# Patient Record
Sex: Female | Born: 1950
Health system: Southern US, Community
[De-identification: ages and names within clinical notes are randomized; demographics above are authoritative.]

## PROBLEM LIST (undated history)

## (undated) DIAGNOSIS — M1990 Unspecified osteoarthritis, unspecified site: Secondary | ICD-10-CM

## (undated) DIAGNOSIS — Z923 Personal history of irradiation: Secondary | ICD-10-CM

## (undated) DIAGNOSIS — C801 Malignant (primary) neoplasm, unspecified: Secondary | ICD-10-CM

## (undated) DIAGNOSIS — E785 Hyperlipidemia, unspecified: Secondary | ICD-10-CM

## (undated) DIAGNOSIS — Z972 Presence of dental prosthetic device (complete) (partial): Secondary | ICD-10-CM

## (undated) DIAGNOSIS — Z860101 Personal history of adenomatous and serrated colon polyps: Secondary | ICD-10-CM

## (undated) DIAGNOSIS — I10 Essential (primary) hypertension: Secondary | ICD-10-CM

## (undated) DIAGNOSIS — E079 Disorder of thyroid, unspecified: Secondary | ICD-10-CM

## (undated) DIAGNOSIS — E119 Type 2 diabetes mellitus without complications: Secondary | ICD-10-CM

## (undated) DIAGNOSIS — Z86010 Personal history of colonic polyps: Secondary | ICD-10-CM

## (undated) DIAGNOSIS — K6282 Dysplasia of anus: Secondary | ICD-10-CM

## (undated) DIAGNOSIS — S72009A Fracture of unspecified part of neck of unspecified femur, initial encounter for closed fracture: Secondary | ICD-10-CM

## (undated) HISTORY — DX: Disorder of thyroid, unspecified: E07.9

## (undated) HISTORY — DX: Malignant (primary) neoplasm, unspecified: C80.1

## (undated) HISTORY — DX: Hyperlipidemia, unspecified: E78.5

## (undated) HISTORY — DX: Essential (primary) hypertension: I10

## (undated) HISTORY — DX: Unspecified osteoarthritis, unspecified site: M19.90

## (undated) HISTORY — PX: POLYPECTOMY: SHX149

## (undated) HISTORY — PX: TUBAL LIGATION: SHX77

## (undated) HISTORY — PX: FRACTURE SURGERY: SHX138

---

## 1997-12-26 ENCOUNTER — Emergency Department (HOSPITAL_COMMUNITY): Admission: EM | Admit: 1997-12-26 | Discharge: 1997-12-26 | Payer: Self-pay | Admitting: Emergency Medicine

## 1999-03-09 ENCOUNTER — Other Ambulatory Visit: Admission: RE | Admit: 1999-03-09 | Discharge: 1999-03-09 | Payer: Self-pay | Admitting: Family Medicine

## 1999-05-05 ENCOUNTER — Other Ambulatory Visit: Admission: RE | Admit: 1999-05-05 | Discharge: 1999-05-22 | Payer: Self-pay | Admitting: Family Medicine

## 2000-05-17 ENCOUNTER — Other Ambulatory Visit: Admission: RE | Admit: 2000-05-17 | Discharge: 2000-05-17 | Payer: Self-pay | Admitting: Family Medicine

## 2002-07-07 ENCOUNTER — Other Ambulatory Visit: Admission: RE | Admit: 2002-07-07 | Discharge: 2002-07-07 | Payer: Self-pay | Admitting: Family Medicine

## 2002-07-13 ENCOUNTER — Ambulatory Visit (HOSPITAL_COMMUNITY): Admission: RE | Admit: 2002-07-13 | Discharge: 2002-07-13 | Payer: Self-pay

## 2002-07-13 ENCOUNTER — Encounter: Payer: Self-pay | Admitting: Family Medicine

## 2003-11-30 ENCOUNTER — Other Ambulatory Visit: Admission: RE | Admit: 2003-11-30 | Discharge: 2003-11-30 | Payer: Self-pay | Admitting: *Deleted

## 2003-12-13 ENCOUNTER — Ambulatory Visit (HOSPITAL_COMMUNITY): Admission: RE | Admit: 2003-12-13 | Discharge: 2003-12-13 | Payer: Self-pay | Admitting: Family Medicine

## 2006-03-18 ENCOUNTER — Ambulatory Visit (HOSPITAL_COMMUNITY): Admission: RE | Admit: 2006-03-18 | Discharge: 2006-03-18 | Payer: Self-pay | Admitting: Family Medicine

## 2006-04-02 ENCOUNTER — Encounter: Admission: RE | Admit: 2006-04-02 | Discharge: 2006-04-02 | Payer: Self-pay | Admitting: Family Medicine

## 2006-08-20 DIAGNOSIS — C801 Malignant (primary) neoplasm, unspecified: Secondary | ICD-10-CM

## 2006-08-20 HISTORY — DX: Malignant (primary) neoplasm, unspecified: C80.1

## 2007-05-27 ENCOUNTER — Ambulatory Visit (HOSPITAL_COMMUNITY): Admission: RE | Admit: 2007-05-27 | Discharge: 2007-05-27 | Payer: Self-pay | Admitting: Family Medicine

## 2007-06-09 ENCOUNTER — Encounter: Admission: RE | Admit: 2007-06-09 | Discharge: 2007-06-09 | Payer: Self-pay | Admitting: Family Medicine

## 2007-07-18 ENCOUNTER — Encounter: Admission: RE | Admit: 2007-07-18 | Discharge: 2007-07-18 | Payer: Self-pay | Admitting: Family Medicine

## 2007-08-21 HISTORY — PX: THYROIDECTOMY: SHX17

## 2008-01-22 ENCOUNTER — Encounter: Admission: RE | Admit: 2008-01-22 | Discharge: 2008-01-22 | Payer: Self-pay | Admitting: Family Medicine

## 2008-03-16 ENCOUNTER — Encounter: Admission: RE | Admit: 2008-03-16 | Discharge: 2008-05-06 | Payer: Self-pay | Admitting: Internal Medicine

## 2008-06-20 DIAGNOSIS — Z85850 Personal history of malignant neoplasm of thyroid: Secondary | ICD-10-CM

## 2008-06-20 DIAGNOSIS — E890 Postprocedural hypothyroidism: Secondary | ICD-10-CM

## 2008-06-20 HISTORY — DX: Postprocedural hypothyroidism: E89.0

## 2008-06-20 HISTORY — DX: Personal history of malignant neoplasm of thyroid: Z85.850

## 2008-06-30 ENCOUNTER — Encounter (INDEPENDENT_AMBULATORY_CARE_PROVIDER_SITE_OTHER): Payer: Self-pay | Admitting: General Surgery

## 2008-06-30 ENCOUNTER — Ambulatory Visit (HOSPITAL_COMMUNITY): Admission: RE | Admit: 2008-06-30 | Discharge: 2008-07-01 | Payer: Self-pay | Admitting: General Surgery

## 2008-06-30 HISTORY — PX: TOTAL THYROIDECTOMY: SHX2547

## 2008-09-27 ENCOUNTER — Encounter (HOSPITAL_COMMUNITY): Admission: RE | Admit: 2008-09-27 | Discharge: 2008-12-26 | Payer: Self-pay | Admitting: General Surgery

## 2008-09-28 ENCOUNTER — Encounter: Admission: RE | Admit: 2008-09-28 | Discharge: 2008-09-28 | Payer: Self-pay | Admitting: Family Medicine

## 2009-10-17 ENCOUNTER — Encounter (HOSPITAL_COMMUNITY): Admission: RE | Admit: 2009-10-17 | Discharge: 2009-12-20 | Payer: Self-pay | Admitting: General Surgery

## 2009-10-26 ENCOUNTER — Encounter: Admission: RE | Admit: 2009-10-26 | Discharge: 2009-10-26 | Payer: Self-pay | Admitting: Family Medicine

## 2009-11-01 ENCOUNTER — Encounter: Admission: RE | Admit: 2009-11-01 | Discharge: 2009-11-01 | Payer: Self-pay | Admitting: Family Medicine

## 2010-04-18 ENCOUNTER — Emergency Department (HOSPITAL_COMMUNITY): Admission: EM | Admit: 2010-04-18 | Discharge: 2010-04-19 | Payer: Self-pay | Admitting: Emergency Medicine

## 2010-09-10 ENCOUNTER — Encounter: Payer: Self-pay | Admitting: Family Medicine

## 2010-09-10 ENCOUNTER — Encounter: Payer: Self-pay | Admitting: General Surgery

## 2010-12-29 ENCOUNTER — Encounter (INDEPENDENT_AMBULATORY_CARE_PROVIDER_SITE_OTHER): Payer: Self-pay | Admitting: General Surgery

## 2011-01-02 NOTE — Op Note (Signed)
NAMEDESTINI, CAMBRE              ACCOUNT NO.:  [id number]   MEDICAL RECORD NO.:  [id number]          PATIENT TYPE:  OIB   LOCATION:  1308                         FACILITY:  [facility]   PHYSICIAN:  Sharlet Salina T. Hoxworth, M.D.DATE OF BIRTH:  01/15/1951   DATE OF PROCEDURE:  06/30/2008  DATE OF DISCHARGE:                               OPERATIVE REPORT   POSTOPERATIVE DIAGNOSIS:  Multinodular goiter.   POSTOPERATIVE DIAGNOSIS:  Multinodular goiter.   SURGICAL PROCEDURE:  Total thyroidectomy.   SURGEON:  Lorne Skeens. Hoxworth, M.D.   ASSISTANTAngelia Mould. Derrell Lolling, M.D.   ANESTHESIA:  General.   BRIEF HISTORY:  Ms. Dosanjh is a 60 year old female with a history of a  progressively enlarging multinodular goiter over a number of years.  She  now is having intermittent increased dysphagia, pressure symptoms and  ultrasound has shown enlargement with multiple nodules measuring up to  2.8 cm bilaterally and the right lobe of the thyroid measuring up to 9.5  cm extending beneath the right clavicle.  This confirmed on exam.  Due  to enlargement of symptoms I have recommended proceeding with total  thyroidectomy.  The nature of the procedure, indications, risks of  bleeding, infection, permanent hypocalcemia and recurrent laryngeal  nerve injury with hoarseness have been discussed and understood.  She is  now brought to the operating room for this procedure.   DESCRIPTION OF PROCEDURE:  The patient was brought to the operating  room, placed in supine position on the operating table and general  endotracheal anesthesia was induced.  She was carefully positioned with  her neck extended and the neck was prepped from chin to nipples and  sterilely draped.  Correct procedure and the patient were verified.  A  curvilinear incision was made in the skin crease two fingerbreadths  above the sternal notch and dissection was carried down through the  subcu and platysma with cautery.  Subplatysmal flaps  were then raised  superiorly up to the thyroid notch, inferiorly to the sternum and  laterally out to the sternocleidomastoid muscles.  The strap muscles  were then divided along the midline from the thyroid cartilage down to  the sternal notch.  The right side was approached first being the larger  side.  The anterior gland was exposed carefully bluntly dissecting the  strap muscles off of this using cautery.  The gland was mobilized out  laterally, inferiorly and superiorly along its anterior surfaces.  The  lower pole was mobilized from up underneath the clavicle with careful  blunt dissection.  The middle thyroid vein was divided with the Harmonic  scalpel.  Following this, traction was applied to the upper pole and  careful blunt dissection upper pole vessels were isolated, clearly  identified and tied in continuity and then additionally clipped  proximally and then divided with the Harmonic scalpel.  This then  allowed the entire gland to rotate nicely anteriorly and using careful  blunt dissection the inferior thyroid vessels were exposed and  dissected.  The recurrent laryngeal nerve was nicely identified along  its course and was protected.  Staying close to  the gland the individual  branches of the inferior thyroid artery were divided with the Harmonic  scalpel and the large lobe was able to mobilized up to the anterior  trachea where it was mobilized off the trachea with cautery to the  isthmus and the isthmus was divided with the Harmonic scalpel.  We did  feel that we saw one of the superior parathyroid gland.  This lobe was  oriented with a suture.  There was no significant bleeding.  Following  this the left side was approached with a very similar essentially  identical dissection.  Again the recurrent laryngeal nerve was nicely  identified along its course.  We did feel that we saw and preserved one  parathyroid gland on this side as well.  After complete removal of the   gland the wound was irrigated and Surgicel was placed along the  trachea/tracheoesophageal groove on either side.  The wound inspected  for a couple of minutes for hemostasis which appeared complete.  Following this the strap muscles were closed in midline with interrupted  Vicryl.  The platysma was closed with interrupted Vicryl and skin closed  with staples and Steri-Strips and Benzoin.  Sponge and instrument counts  were correct.  The patient was taken to recovery in good condition.      Lorne Skeens. Hoxworth, M.D.  Electronically Signed     BTH/MEDQ  D:  06/30/2008  T:  06/30/2008  Job:  [id number]

## 2011-05-22 LAB — CBC
HCT: 39
MCV: 82.3
Platelets: 191
RBC: 4.74
RDW: 13.5
RDW: 13.6
WBC: 5.5

## 2011-05-22 LAB — GLUCOSE, CAPILLARY
Glucose-Capillary: 146 — ABNORMAL HIGH
Glucose-Capillary: 187 — ABNORMAL HIGH
Glucose-Capillary: 241 — ABNORMAL HIGH
Glucose-Capillary: 242 — ABNORMAL HIGH

## 2011-05-22 LAB — URINALYSIS, ROUTINE W REFLEX MICROSCOPIC
Glucose, UA: NEGATIVE
Nitrite: NEGATIVE
Protein, ur: NEGATIVE
Urobilinogen, UA: 2 — ABNORMAL HIGH
pH: 5.5

## 2011-05-22 LAB — DIFFERENTIAL
Basophils Absolute: 0.1
Basophils Relative: 1
Monocytes Absolute: 0.3
Monocytes Relative: 6
Neutrophils Relative %: 60

## 2011-05-22 LAB — COMPREHENSIVE METABOLIC PANEL
ALT: 41 — ABNORMAL HIGH
AST: 36
Albumin: 4
BUN: 12
Calcium: 8.8
GFR calc Af Amer: >60
GFR calc non Af Amer: >60
Glucose, Bld: 154 — ABNORMAL HIGH

## 2011-05-22 LAB — CALCIUM
Calcium: 7.6 — ABNORMAL LOW
Calcium: 7.8 — ABNORMAL LOW

## 2011-06-05 ENCOUNTER — Encounter (INDEPENDENT_AMBULATORY_CARE_PROVIDER_SITE_OTHER): Payer: Self-pay | Admitting: General Surgery

## 2011-08-03 ENCOUNTER — Other Ambulatory Visit: Payer: Self-pay | Admitting: Family Medicine

## 2011-08-03 ENCOUNTER — Other Ambulatory Visit: Payer: Self-pay | Admitting: Nurse Practitioner

## 2011-08-03 DIAGNOSIS — Z1231 Encounter for screening mammogram for malignant neoplasm of breast: Secondary | ICD-10-CM

## 2011-08-24 ENCOUNTER — Ambulatory Visit (INDEPENDENT_AMBULATORY_CARE_PROVIDER_SITE_OTHER): Payer: Commercial Managed Care - PPO | Admitting: General Surgery

## 2011-08-24 ENCOUNTER — Encounter (INDEPENDENT_AMBULATORY_CARE_PROVIDER_SITE_OTHER): Payer: Self-pay | Admitting: General Surgery

## 2011-08-24 VITALS — BP 144/76 | HR 92 | Temp 97.2°F | Ht 59.0 in | Wt 165.8 lb

## 2011-08-24 DIAGNOSIS — Z85850 Personal history of malignant neoplasm of thyroid: Secondary | ICD-10-CM

## 2011-08-24 HISTORY — DX: Personal history of malignant neoplasm of thyroid: Z85.850

## 2011-08-24 NOTE — Progress Notes (Signed)
History: Patient returns for long-term followup with a history of cancer of the thyroid, 2 cm papillary cancer with total thyroidectomy in November of 2009. TNM was T1 B. N0. She received postoperative I-131 treatment. She had a negative scan in March of 2011. Her Synthroid dose is being adjusted by her primary care team. She reports no neck symptoms and is feeling well.  Exam: Gen.: Moderately obese in no acute distress HEENT no palpable neck masses. Incision well healed. Voice is normal. Lymph nodes: No palpable cervical or supraclavicular lymph nodes.  Assessment and plan: Over 3 years following total thyroidectomy and I-131 treatment for T1 B. papillary cancer of the thyroid. Doing well with no evidence of recurrent disease. Her Synthroid dose is being managed by her primary care team. I will be available as needed.

## 2011-08-31 ENCOUNTER — Ambulatory Visit
Admission: RE | Admit: 2011-08-31 | Discharge: 2011-08-31 | Disposition: A | Discharge status: Home / Self Care | Payer: Commercial Managed Care - PPO | Source: Ambulatory Visit | Attending: Family Medicine | Admitting: Family Medicine

## 2011-08-31 DIAGNOSIS — Z1231 Encounter for screening mammogram for malignant neoplasm of breast: Secondary | ICD-10-CM

## 2011-10-12 ENCOUNTER — Encounter: Payer: Self-pay | Admitting: Internal Medicine

## 2011-11-15 ENCOUNTER — Ambulatory Visit (AMBULATORY_SURGERY_CENTER): Payer: 59 | Admitting: *Deleted

## 2011-11-15 VITALS — Ht 59.0 in | Wt 163.8 lb

## 2011-11-15 DIAGNOSIS — Z1211 Encounter for screening for malignant neoplasm of colon: Secondary | ICD-10-CM

## 2011-11-15 MED ORDER — PEG-KCL-NACL-NASULF-NA ASC-C 100 G PO SOLR: ORAL | Status: DC

## 2011-11-29 ENCOUNTER — Encounter: Payer: Commercial Managed Care - PPO | Admitting: Internal Medicine

## 2011-12-06 ENCOUNTER — Ambulatory Visit (AMBULATORY_SURGERY_CENTER): Payer: 59 | Admitting: Internal Medicine

## 2011-12-06 ENCOUNTER — Encounter: Payer: Self-pay | Admitting: Internal Medicine

## 2011-12-06 VITALS — BP 139/76 | HR 71 | Temp 98.6°F | Resp 16 | Ht 59.0 in | Wt 163.0 lb

## 2011-12-06 DIAGNOSIS — K635 Polyp of colon: Secondary | ICD-10-CM

## 2011-12-06 DIAGNOSIS — D126 Benign neoplasm of colon, unspecified: Secondary | ICD-10-CM

## 2011-12-06 DIAGNOSIS — Z1211 Encounter for screening for malignant neoplasm of colon: Secondary | ICD-10-CM

## 2011-12-06 HISTORY — PX: COLONOSCOPY: SHX174

## 2011-12-06 MED ORDER — SODIUM CHLORIDE 0.9 % IV SOLN: 500.0000 mL | INTRAVENOUS | Status: DC

## 2011-12-06 NOTE — Op Note (Signed)
[facility] [address] [place], [place]  [address]  COLONOSCOPY PROCEDURE REPORT  Schmidt:  Morgan Schmidt, Morgan Schmidt  MR#:  [id number] BIRTHDATE:  23-May-1951, 60 yrs. old  GENDER:  female ENDOSCOPIST:  Carie Caddy. Dorota Heinrichs, MD REF. BY:  Paulita Cradle, N.P. PROCEDURE DATE:  12/06/2011 PROCEDURE:  Colonoscopy with polypectomy and submucosal injection ASA CLASS:  Class II INDICATIONS:  Routine Risk Screening, 1st colonoscopy MEDICATIONS:   MAC sedation, administered by CRNA, propofol (Diprivan) 380 mg IV  DESCRIPTION OF PROCEDURE:   After Morgan risks benefits and alternatives of Morgan procedure were thoroughly explained, informed consent was obtained.  Digital rectal exam was performed and revealed external hemorrhoids.   Morgan LB CF-H180AL [id number] endoscope was introduced through Morgan anus and advanced to Morgan terminal ileum which was intubated for a short distance, without limitations.  Morgan quality of Morgan prep was good, using MoviPrep. Morgan instrument was then slowly withdrawn as Morgan colon was fully examined.<<PROCEDUREIMAGES>>  FINDINGS:  Morgan terminal ileum appeared normal.  Mild diverticulosis was found ascending colon to sigmoid colon.  A 6 mm sessile polyp was found in Morgan ascending colon. Polyp was snared, then cauterized with monopolar cautery. Retrieval was successful. An 8 mm sessile polyp was found in Morgan ascending colon. Polyp was snared, then cauterized with monopolar cautery. Removal was in piecemeal fashion. Retrieval was successful.  [place] Ink was used [employer] for tattoo beside polypectomy site.   A 5 mm  sessile polyp was found in Morgan transverse colon. Polyp was snared without cautery. Retrieval was successful. A 3 mm sessile polyp was found in Morgan sigmoid colon. Polyp was snared without cautery. Retrieval was successful. snare polyp  A 2 cm pedunculated polyp was found in Morgan sigmoid colon. Submucosal injection with 1:10,000 epinephrine was used at Morgan time of colonoscope  insertion to shrink polyp.  Polyp was snared during withdrawal, then cauterized with monopolar cautery. Retrieval was successful.  A 5 mm sessile polyp was found in Morgan rectum. Polyp was snared without cautery. Retrieval was successful.  External Hemorrhoids were found. Retroflexed views in Morgan rectum revealed no abnormalities.  Morgan scope was then withdrawn  from Morgan cecum and Morgan procedure completed.  COMPLICATIONS:  None  ENDOSCOPIC IMPRESSION: 1) Normal terminal ileum 2) Mild diverticulosis ascending colon to sigmoid colon 3) Sessile polyp in Morgan ascending colon. Removed and sent to pathology. 4) Sessile polyp in Morgan ascending colon, removed piecemeal fashion.  Tattoo placed.  Sent to pathology. 5) Sessile polyp in Morgan transverse colon. Removed and sent to pathology. 6) Sessile polyp in Morgan sigmoid colon. Removed and sent to pathology. 7) Pedunculated polyp in Morgan sigmoid colon. Removed and sent to pathology. 8) Sessile polyp in Morgan rectum. Removed and sent to pathology. 9) External hemorrhoids  RECOMMENDATIONS: 1) Hold aspirin, aspirin products, and anti-inflammatory medication for 2 weeks. 2) Await pathology results 3) High fiber diet. 4) You will receive a letter within 1-2 weeks with Morgan results of your biopsy as well as final recommendations. Please call my office if you have not received a letter after 3 weeks. 5) Repeat colonoscopy in 6 months to ensure complete polypectomy at piecemeal polypectomy site.  Carie Caddy. Rhea Belton, MD  CC:  Paulita Cradle, NP Morgan Schmidt  n. eSIGNEDCarie Caddy. Tal Neer at 12/06/2011 02:25 PM  Morgan Schmidt, [id number]

## 2011-12-06 NOTE — Patient Instructions (Signed)
Discharge instructions given with verbal understanding. Handouts on polyps,diverticulosis and hemorrhoids given. Resume previous medications. Hold aspirin, aspirin products, and anti-inflammatory products for two weeks.YOU HAD AN ENDOSCOPIC PROCEDURE TODAY AT THE [facility]: Refer to the procedure report that was given to you for any specific questions about what was found during the examination.  If the procedure report does not answer your questions, please call your gastroenterologist to clarify.  If you requested that your care partner not be given the details of your procedure findings, then the procedure report has been included in a sealed envelope for you to review at your convenience later.  YOU SHOULD EXPECT: Some feelings of bloating in the abdomen. Passage of more gas than usual.  Walking can help get rid of the air that was put into your GI tract during the procedure and reduce the bloating. If you had a lower endoscopy (such as a colonoscopy or flexible sigmoidoscopy) you may notice spotting of blood in your stool or on the toilet paper. If you underwent a bowel prep for your procedure, then you may not have a normal bowel movement for a few days.  DIET: Your first meal following the procedure should be a light meal and then it is ok to progress to your normal diet.  A half-sandwich or bowl of soup is an example of a good first meal.  Heavy or fried foods are harder to digest and may make you feel nauseous or bloated.  Likewise meals heavy in dairy and vegetables can cause extra gas to form and this can also increase the bloating.  Drink plenty of fluids but you should avoid alcoholic beverages for 24 hours.  ACTIVITY: Your care partner should take you home directly after the procedure.  You should plan to take it easy, moving slowly for the rest of the day.  You can resume normal activity the day after the procedure however you should NOT DRIVE or use heavy machinery for 24  hours (because of the sedation medicines used during the test).    SYMPTOMS TO REPORT IMMEDIATELY: A gastroenterologist can be reached at any hour.  During normal business hours, 8:30 AM to 5:00 PM Monday through Friday, call [phone].  After hours and on weekends, please call the GI answering service at [phone] who will take a message and have the physician on call contact you.   Following lower endoscopy (colonoscopy or flexible sigmoidoscopy):  Excessive amounts of blood in the stool  Significant tenderness or worsening of abdominal pains  Swelling of the abdomen that is new, acute  Fever of 100F or higher  FOLLOW UP: If any biopsies were taken you will be contacted by phone or by letter within the next 1-3 weeks.  Call your gastroenterologist if you have not heard about the biopsies in 3 weeks.  Our staff will call the home number listed on your records the next business day following your procedure to check on you and address any questions or concerns that you may have at that time regarding the information given to you following your procedure. This is a courtesy call and so if there is no answer at the home number and we have not heard from you through the emergency physician on call, we will assume that you have returned to your regular daily activities without incident.  SIGNATURES/CONFIDENTIALITY: You and/or your care partner have signed paperwork which will be entered into your electronic medical record.  These signatures attest to the fact  that that the information above on your After Visit Summary has been reviewed and is understood.  Full responsibility of the confidentiality of this discharge information lies with you and/or your care-partner.

## 2011-12-06 NOTE — Progress Notes (Signed)
Patient did not experience any of the following events: a burn prior to discharge; a fall within the facility; wrong site/side/patient/procedure/implant event; or a hospital transfer or hospital admission upon discharge from the facility. (G8907) Patient did not have preoperative order for IV antibiotic SSI prophylaxis. (G8918)  

## 2011-12-07 ENCOUNTER — Telehealth: Payer: Self-pay

## 2011-12-07 NOTE — Telephone Encounter (Signed)
  Follow up Call-  Call back number 12/06/2011  Post procedure Call Back phone  # [phone]  Permission to leave phone message Yes     Patient questions:  Do you have a fever, pain , or abdominal swelling? no Pain Score  0 *  Have you tolerated food without any problems? yes  Have you been able to return to your normal activities? yes  Do you have any questions about your discharge instructions: Diet   no Medications  no Follow up visit  no  Do you have questions or concerns about your Care? no  Actions: * If pain score is 4 or above: No action needed, pain <4.

## 2011-12-14 ENCOUNTER — Encounter: Payer: Self-pay | Admitting: Internal Medicine

## 2012-05-27 ENCOUNTER — Encounter: Payer: Self-pay | Admitting: Internal Medicine

## 2012-11-13 ENCOUNTER — Telehealth: Payer: Self-pay | Admitting: Physician Assistant

## 2012-11-13 MED ORDER — METFORMIN HCL 1000 MG PO TABS: 1000.0000 mg | ORAL_TABLET | Freq: Two times a day (BID) | ORAL | Status: DC

## 2012-11-13 MED ORDER — LEVOTHYROXINE SODIUM 112 MCG PO TABS: 112.0000 ug | ORAL_TABLET | Freq: Every day | ORAL | Status: DC

## 2012-11-13 NOTE — Telephone Encounter (Signed)
Needs refill on thyroid med and diabetic medicine, metformin.  [facility].

## 2012-11-13 NOTE — Telephone Encounter (Signed)
Both meds refilled 

## 2012-11-18 ENCOUNTER — Other Ambulatory Visit: Payer: Self-pay

## 2012-11-18 DIAGNOSIS — Z1231 Encounter for screening mammogram for malignant neoplasm of breast: Secondary | ICD-10-CM

## 2012-12-18 ENCOUNTER — Encounter: Payer: Self-pay | Admitting: Internal Medicine

## 2012-12-18 ENCOUNTER — Ambulatory Visit
Admission: RE | Admit: 2012-12-18 | Discharge: 2012-12-18 | Disposition: A | Discharge status: Home / Self Care | Payer: 59 | Source: Ambulatory Visit

## 2012-12-18 DIAGNOSIS — Z1231 Encounter for screening mammogram for malignant neoplasm of breast: Secondary | ICD-10-CM

## 2012-12-26 ENCOUNTER — Telehealth: Payer: Self-pay | Admitting: General Practice

## 2012-12-26 NOTE — Telephone Encounter (Signed)
Called patient to review Mammogram results. No answer

## 2013-03-09 ENCOUNTER — Encounter: Payer: Self-pay | Admitting: Family Medicine

## 2013-03-09 ENCOUNTER — Ambulatory Visit (INDEPENDENT_AMBULATORY_CARE_PROVIDER_SITE_OTHER): Payer: 59 | Admitting: Family Medicine

## 2013-03-09 VITALS — BP 130/76 | HR 86 | Temp 99.6°F | Ht 59.0 in | Wt 164.4 lb

## 2013-03-09 DIAGNOSIS — L0291 Cutaneous abscess, unspecified: Secondary | ICD-10-CM

## 2013-03-09 DIAGNOSIS — L309 Dermatitis, unspecified: Secondary | ICD-10-CM

## 2013-03-09 DIAGNOSIS — L0390 Cellulitis, unspecified: Secondary | ICD-10-CM

## 2013-03-09 DIAGNOSIS — L259 Unspecified contact dermatitis, unspecified cause: Secondary | ICD-10-CM

## 2013-03-09 MED ORDER — METHYLPREDNISOLONE (PAK) 4 MG PO TABS: ORAL_TABLET | ORAL | Status: DC

## 2013-03-09 MED ORDER — DOXYCYCLINE HYCLATE 100 MG PO TABS: 100.0000 mg | ORAL_TABLET | Freq: Two times a day (BID) | ORAL | Status: DC

## 2013-03-09 MED ORDER — HYDROXYZINE HCL 25 MG PO TABS: 25.0000 mg | ORAL_TABLET | Freq: Three times a day (TID) | ORAL | Status: DC | PRN

## 2013-03-09 NOTE — Patient Instructions (Signed)
Cellulitis Cellulitis is an infection of the skin and the tissue beneath it. The infected area is usually red and tender. Cellulitis occurs most often in the arms and lower legs.  CAUSES  Cellulitis is caused by bacteria that enter the skin through cracks or cuts in the skin. The most common types of bacteria that cause cellulitis are Staphylococcus and Streptococcus. SYMPTOMS   Redness and warmth.  Swelling.  Tenderness or pain.  Fever. DIAGNOSIS  Your caregiver can usually determine what is wrong based on a physical exam. Blood tests may also be done. TREATMENT  Treatment usually involves taking an antibiotic medicine. HOME CARE INSTRUCTIONS   Take your antibiotics as directed. Finish them even if you start to feel better.  Keep the infected arm or leg elevated to reduce swelling.  Apply a warm cloth to the affected area up to 4 times per day to relieve pain.  Only take over-the-counter or prescription medicines for pain, discomfort, or fever as directed by your caregiver.  Keep all follow-up appointments as directed by your caregiver. SEEK MEDICAL CARE IF:   You notice red streaks coming from the infected area.  Your red area gets larger or turns dark in color.  Your bone or joint underneath the infected area becomes painful after the skin has healed.  Your infection returns in the same area or another area.  You notice a swollen bump in the infected area.  You develop new symptoms. SEEK IMMEDIATE MEDICAL CARE IF:   You have a fever.  You feel very sleepy.  You develop vomiting or diarrhea.  You have a general ill feeling (malaise) with muscle aches and pains. MAKE SURE YOU:   Understand these instructions.  Will watch your condition.  Will get help right away if you are not doing well or get worse. Document Released: 05/16/2005 Document Revised: 02/05/2012 Document Reviewed: 10/22/2011 ExitCare Patient Information 2014 [employer].  

## 2013-03-09 NOTE — Progress Notes (Signed)
  Subjective:    Patient ID: ILY DENNO, female    DOB: 25-May-1951, 63 y.o.   MRN: [id number]  HPI This 62 y.o. female presents for evaluation of rash for over a week.  She states she  Was bitten by an insect and then she had a mild rash.  She got worse over The weekend .   Review of Systems No chest pain, SOB, HA, dizziness, vision change, N/V, diarrhea, constipation, dysuria, urinary urgency or frequency, myalgias, arthralgias or rash.     Objective:   Physical Exam  Vital signs noted  Well developed well nourished female.  HEENT - Head atraumatic Normocephalic                Eyes - PERRLA, Conjuctiva - clear Sclera- Clear EOMI                Ears - EAC's Wnl TM's Wnl Gross Hearing WNL                Nose - Nares patent                 Throat - oropharanx wnl Respiratory - Lungs CTA bilateral Cardiac - RRR S1 and S2 without murmur GI - Abdomen soft Nontender and bowel sounds active x 4 Extremities - Right first toe with swelling erythema and small ulcer in skin, rash over bilateral lower extremities. Neuro - Grossly intact.      Assessment & Plan:  Dermatitis - Plan: hydrOXYzine (ATARAX/VISTARIL) 25 MG tablet, methylPREDNIsolone (MEDROL DOSPACK) 4 MG tablet  Cellulitis - Plan: doxycycline (VIBRA-TABS) 100 MG tablet

## 2013-05-05 ENCOUNTER — Encounter: Payer: Self-pay | Admitting: Family Medicine

## 2013-05-05 ENCOUNTER — Ambulatory Visit (INDEPENDENT_AMBULATORY_CARE_PROVIDER_SITE_OTHER): Payer: 59 | Admitting: Family Medicine

## 2013-05-05 VITALS — BP 129/70 | HR 82 | Temp 97.4°F | Ht 59.0 in | Wt 163.4 lb

## 2013-05-05 DIAGNOSIS — Z0000 Encounter for general adult medical examination without abnormal findings: Secondary | ICD-10-CM

## 2013-05-05 DIAGNOSIS — Z780 Asymptomatic menopausal state: Secondary | ICD-10-CM

## 2013-05-05 DIAGNOSIS — N951 Menopausal and female climacteric states: Secondary | ICD-10-CM

## 2013-05-05 DIAGNOSIS — E039 Hypothyroidism, unspecified: Secondary | ICD-10-CM

## 2013-05-05 DIAGNOSIS — E119 Type 2 diabetes mellitus without complications: Secondary | ICD-10-CM

## 2013-05-05 LAB — POCT CBC
Granulocyte percent: 60.7 %G (ref 37–80)
HCT, POC: 39.2 % (ref 37.7–47.9)
Hemoglobin: 13.3 g/dL (ref 12.2–16.2)
Lymph, poc: 1.9 (ref 0.6–3.4)
MCH, POC: 27.5 pg (ref 27–31.2)
MCHC: 34 g/dL (ref 31.8–35.4)
MCV: 81 fL (ref 80–97)
MPV: 7.8 fL (ref 0–99.8)
POC Granulocyte: 3.3 (ref 2–6.9)
POC LYMPH PERCENT: 33.9 %L (ref 10–50)
Platelet Count, POC: 205 10*3/uL (ref 142–424)
RBC: 4.8 M/uL (ref 4.04–5.48)
RDW, POC: 13.8 %
WBC: 5.5 10*3/uL (ref 4.6–10.2)

## 2013-05-05 LAB — POCT GLYCOSYLATED HEMOGLOBIN (HGB A1C): Hemoglobin A1C: 8.4

## 2013-05-05 MED ORDER — LEVOTHYROXINE SODIUM 112 MCG PO TABS: 112.0000 ug | ORAL_TABLET | Freq: Every day | ORAL | Status: DC

## 2013-05-05 MED ORDER — INSULIN DETEMIR 100 UNIT/ML ~~LOC~~ SOLN: 40.0000 [IU] | Freq: Every day | SUBCUTANEOUS | Status: DC

## 2013-05-05 MED ORDER — SITAGLIPTIN PHOSPHATE 100 MG PO TABS: 100.0000 mg | ORAL_TABLET | Freq: Every day | ORAL | Status: DC

## 2013-05-05 MED ORDER — METFORMIN HCL 1000 MG PO TABS: 1000.0000 mg | ORAL_TABLET | Freq: Two times a day (BID) | ORAL | Status: DC

## 2013-05-05 NOTE — Patient Instructions (Addendum)

## 2013-05-05 NOTE — Progress Notes (Signed)
  Subjective:    [name], female    DOB: [birth year], 62 y.o.   MRN: [id number]  HPI This 62 y.o. female presents for evaluation of follow up.  She has hx diabetes and hypothyroidism. She works 12 hour shifts at night at [employer].  She works as Lawyer.  She states She has fasting fsbs of 120's.   Review of Systems No chest pain, SOB, HA, dizziness, vision change, N/V, diarrhea, constipation, dysuria, urinary urgency or frequency, myalgias, arthralgias or rash.     Objective:   Physical Exam  Vital signs noted  Well developed well nourished female.  HEENT - Head atraumatic Normocephalic                Eyes - PERRLA, Conjuctiva - clear Sclera- Clear EOMI                Ears - EAC's Wnl TM's Wnl Gross Hearing WNL                Nose - Nares patent                 Throat - oropharanx wnl Respiratory - Lungs CTA bilateral Cardiac - RRR S1 and S2 without murmur GI - Abdomen soft Nontender and bowel sounds active x 4 Extremities - No edema. Neuro - Grossly intact.      Assessment & Plan:  Routine general medical examination at a health care facility - Plan: POCT CBC, CMP14+EGFR, Lipid panel, Thyroid Panel With TSH, Vit D  25 hydroxy (rtn osteoporosis monitoring)  Diabetes - Plan: sitaGLIPtin (JANUVIA) 100 MG tablet, metFORMIN (GLUCOPHAGE) 1000 MG tablet, insulin detemir (LEVEMIR) 100 UNIT/ML injection, POCT glycosylated hemoglobin (Hb A1C)  Unspecified hypothyroidism - Plan: levothyroxine (SYNTHROID, LEVOTHROID) 112 MCG tablet  Menopause - Plan: Vit D  25 hydroxy (rtn osteoporosis monitoring), DG Bone Density  Follow up in 3 months

## 2013-05-06 ENCOUNTER — Other Ambulatory Visit: Payer: Self-pay | Admitting: Family Medicine

## 2013-05-06 DIAGNOSIS — E039 Hypothyroidism, unspecified: Secondary | ICD-10-CM

## 2013-05-06 DIAGNOSIS — E119 Type 2 diabetes mellitus without complications: Secondary | ICD-10-CM

## 2013-05-06 LAB — CMP14+EGFR
ALT: 29 IU/L (ref 0–32)
AST: 34 IU/L (ref 0–40)
Albumin/Globulin Ratio: 2.6 — ABNORMAL HIGH (ref 1.1–2.5)
Albumin: 4.7 g/dL (ref 3.6–4.8)
Alkaline Phosphatase: 72 IU/L (ref 39–117)
BUN/Creatinine Ratio: 13 (ref 11–26)
BUN: 10 mg/dL (ref 8–27)
CO2: 26 mmol/L (ref 18–29)
Calcium: 8.4 mg/dL — ABNORMAL LOW (ref 8.6–10.2)
Chloride: 95 mmol/L — ABNORMAL LOW (ref 97–108)
Creatinine, Ser: 0.77 mg/dL (ref 0.57–1.00)
GFR calc Af Amer: 97 mL/min/{1.73_m2} (ref >59)
GFR calc non Af Amer: 84 mL/min/{1.73_m2} (ref >59)
Globulin, Total: 1.8 g/dL (ref 1.5–4.5)
Glucose: 288 mg/dL — ABNORMAL HIGH (ref 65–99)
Potassium: 4.2 mmol/L (ref 3.5–5.2)
Sodium: 138 mmol/L (ref 134–144)
Total Bilirubin: 0.5 mg/dL (ref 0.0–1.2)
Total Protein: 6.5 g/dL (ref 6.0–8.5)

## 2013-05-06 LAB — THYROID PANEL WITH TSH
Free Thyroxine Index: 3 (ref 1.2–4.9)
T3 Uptake Ratio: 24 % (ref 24–39)
T4, Total: 12.3 ug/dL — ABNORMAL HIGH (ref 4.5–12.0)
TSH: 5.58 u[IU]/mL — ABNORMAL HIGH (ref 0.450–4.500)

## 2013-05-06 LAB — LIPID PANEL
Chol/HDL Ratio: 6 ratio units — ABNORMAL HIGH (ref 0.0–4.4)
Cholesterol, Total: 204 mg/dL — ABNORMAL HIGH (ref 100–199)
HDL: 34 mg/dL — ABNORMAL LOW (ref >39)
LDL Calculated: 109 mg/dL — ABNORMAL HIGH (ref 0–99)
Triglycerides: 305 mg/dL — ABNORMAL HIGH (ref 0–149)
VLDL Cholesterol Cal: 61 mg/dL — ABNORMAL HIGH (ref 5–40)

## 2013-05-06 LAB — VITAMIN D 25 HYDROXY (VIT D DEFICIENCY, FRACTURES): Vit D, 25-Hydroxy: 17.1 ng/mL — ABNORMAL LOW (ref 30.0–100.0)

## 2013-05-06 MED ORDER — GLIPIZIDE 5 MG PO TABS: 5.0000 mg | ORAL_TABLET | Freq: Two times a day (BID) | ORAL | Status: DC

## 2013-05-06 MED ORDER — LEVOTHYROXINE SODIUM 125 MCG PO TABS: 125.0000 ug | ORAL_TABLET | Freq: Every day | ORAL | Status: DC

## 2013-05-06 MED ORDER — VITAMIN D (ERGOCALCIFEROL) 1.25 MG (50000 UNIT) PO CAPS: 50000.0000 [IU] | ORAL_CAPSULE | ORAL | Status: DC

## 2013-05-07 ENCOUNTER — Telehealth: Payer: Self-pay

## 2013-05-07 NOTE — Telephone Encounter (Signed)
[facility] pharmacy pharmacist called because you wrote for Glipizide and they wanted to make sure you were aware that patient is already on 2 other oral agents, Januvia and Metformin and also on Levamir   Please call pharmacy at [phone] and let Lanora Manis know what is your plan

## 2013-06-20 ENCOUNTER — Encounter: Payer: 59 | Attending: Family Medicine

## 2013-06-20 VITALS — Ht 59.0 in | Wt 162.1 lb

## 2013-06-20 DIAGNOSIS — Z713 Dietary counseling and surveillance: Secondary | ICD-10-CM | POA: Insufficient documentation

## 2013-06-20 DIAGNOSIS — E119 Type 2 diabetes mellitus without complications: Secondary | ICD-10-CM | POA: Insufficient documentation

## 2013-06-20 NOTE — Progress Notes (Signed)
Patient was seen on 06/20/13 for the complete diabetes self-management series at [facility].  Current A1c = 8.1%  Handouts given during class include:  Living Well with Diabetes book  Carb Counting and Meal Planning book  Meal Plan Card  Carbohydrate guide  Meal planning worksheet  Low Sodium Flavoring Tips  The diabetes portion plate  Low Carbohydrate Snack Suggestions  A1c to eAG Conversion Chart  Diabetes Medications  Stress Management  Diabetes Recommended Care Schedule  Diabetes Success Plan  Core Class Satisfaction Survey  The following learning objectives were met by the patient during this course:  Describe diabetes  State some common risk factors for diabetes  Defines the role of glucose and insulin  Identifies type of diabetes and pathophysiology  Describe the relationship between diabetes and cardiovascular risk  State the members of the Healthcare Team  States the rationale for glucose monitoring  State when to test glucose  State their individual Target Range  State the importance of logging glucose readings  Describe how to interpret glucose readings  Identifies A1C target  Explain the correlation between A1c and eAG values  State symptoms and treatment of high blood glucose  State symptoms and treatment of low blood glucose  Explain proper technique for glucose testing  Identifies proper sharps disposal  Describe the role of different macronutrients on glucose  Explain how carbohydrates affect blood glucose  State what foods contain the most carbohydrates  Demonstrate carbohydrate counting  Demonstrate how to read Nutrition Facts food label  Describe effects of various fats on heart health  Describe the importance of good nutrition for health and healthy eating strategies  Describe techniques for managing your shopping, cooking and meal planning  List strategies to follow meal plan  when dining out  Describe the effects of alcohol on glucose and how to use it safely   State the amount of activity recommended for healthy living   Describe activities suitable for individual needs   Identify ways to regularly incorporate activity into daily life   Identify barriers to activity and ways to over come these barriers  Identify diabetes medications being personally used and their primary action for lowering glucose and possible side effects   Describe role of stress on blood glucose and develop strategies to address psychosocial issues   Identify diabetes complications and ways to prevent them  Explain how to manage diabetes during illness   Evaluate success in meeting personal goal   Establish 2-3 goals that they will plan to diligently work on until they return for the  81-month follow-up visit  Your patient has established the following 4 month goals in their individualized success plan:  Reduce fat in my diet by eating less fried food  Be active 10 minutes or more 2 times a week  Your patient has identified these potential barriers to change:  Working overtime  Your patient has identified their diabetes self-care support plan as  [facility]

## 2013-06-20 NOTE — Patient Instructions (Signed)
Goals:  Follow Diabetes Meal Plan as instructed  Eat 3 meals and 2 snacks, every 3-5 hrs  Limit carbohydrate intake to 45 grams carbohydrate/meal  Limit carbohydrate intake to 15 grams carbohydrate/snack  Add lean protein foods to meals/snacks  Monitor glucose levels as instructed by your doctor  Aim for 30 mins of physical activity daily  Bring food record and glucose log to your next nutrition visit  

## 2013-06-24 ENCOUNTER — Telehealth: Payer: Self-pay | Admitting: Family Medicine

## 2013-06-24 ENCOUNTER — Other Ambulatory Visit: Payer: Self-pay

## 2013-06-24 MED ORDER — INSULIN PEN NEEDLE 30G X 8 MM MISC: 1.0000 | Status: DC | PRN

## 2013-06-24 NOTE — Telephone Encounter (Signed)
PT SAID USED LEVEMIR FLEXPEN AND GETS 60U QHS, NOT THE VIAL. NEED REFILL SENT TO [facility]. ALSO NEEDS NEEDLES FOR LEVEMIR FLEXPEN.

## 2013-06-25 ENCOUNTER — Other Ambulatory Visit: Payer: Self-pay | Admitting: Family Medicine

## 2013-06-25 MED ORDER — INSULIN DETEMIR 100 UNIT/ML FLEXPEN: 60.0000 [IU] | PEN_INJECTOR | SUBCUTANEOUS | Status: DC

## 2013-06-26 ENCOUNTER — Other Ambulatory Visit: Payer: Self-pay

## 2013-06-26 MED ORDER — INSULIN DETEMIR 100 UNIT/ML FLEXPEN: 60.0000 [IU] | Freq: Every day | SUBCUTANEOUS | Status: DC

## 2013-10-07 ENCOUNTER — Ambulatory Visit (INDEPENDENT_AMBULATORY_CARE_PROVIDER_SITE_OTHER): Payer: 59 | Admitting: Family Medicine

## 2013-10-07 ENCOUNTER — Encounter: Payer: Self-pay | Admitting: *Deleted

## 2013-10-07 ENCOUNTER — Encounter: Payer: Self-pay | Admitting: Family Medicine

## 2013-10-07 VITALS — BP 164/78 | HR 96 | Temp 100.0°F | Ht 59.0 in | Wt 164.0 lb

## 2013-10-07 DIAGNOSIS — J069 Acute upper respiratory infection, unspecified: Secondary | ICD-10-CM

## 2013-10-07 MED ORDER — AZITHROMYCIN 250 MG PO TABS: ORAL_TABLET | ORAL | Status: DC

## 2013-10-07 NOTE — Progress Notes (Signed)
   Subjective:    [name], female    DOB: May 18, 1951, 63 y.o.   MRN: [id number]  HPI This 63 y.o. female presents for evaluation of URI sx's for over a week.   Review of Systems    No chest pain, SOB, HA, dizziness, vision change, N/V, diarrhea, constipation, dysuria, urinary urgency or frequency, myalgias, arthralgias or rash.  Objective:   Physical Exam   Vital signs noted  Well developed well nourished female.  HEENT - Head atraumatic Normocephalic                Eyes - PERRLA, Conjuctiva - clear Sclera- Clear EOMI                Ears - EAC's Wnl TM's Wnl Gross Hearing WN                Throat - oropharanx wnl Respiratory - Lungs CTA bilateral Cardiac - RRR S1 and S2 without murmur GI - Abdomen soft Nontender and bowel sounds active x 4 Extremities - No edema. Neuro - Grossly intact.     Assessment & Plan:  URI (upper respiratory infection) - Plan: azithromycin (ZITHROMAX) 250 MG tablet Push po fluids, rest, tylenol and motrin otc prn as directed for fever, arthralgias, and myalgias.  Follow up prn if sx's continue or persist.  Lysbeth Penner FNP

## 2013-10-26 ENCOUNTER — Encounter: Payer: 59 | Attending: Family Medicine | Admitting: Dietician

## 2013-10-26 VITALS — Ht 59.0 in | Wt 167.5 lb

## 2013-10-26 DIAGNOSIS — Z713 Dietary counseling and surveillance: Secondary | ICD-10-CM | POA: Insufficient documentation

## 2013-10-26 DIAGNOSIS — E119 Type 2 diabetes mellitus without complications: Secondary | ICD-10-CM | POA: Insufficient documentation

## 2013-10-26 NOTE — Patient Instructions (Signed)
Goals:  Follow Diabetes Meal Plan as instructed  Eat 3 meals and 2 snacks, every 3-5 hrs  Limit carbohydrate intake to 45 grams carbohydrate/meal  Limit carbohydrate intake to 15 grams carbohydrate/snack  Add lean protein foods to meals/snacks  Monitor glucose levels as instructed by your doctor  Aim for 30 mins of physical activity daily  Bring food record and glucose log to your next nutrition visit  Portion out snacks ahead of time to help not eat too many carbs and pair with protein  Consider keeping [facility] Protein bar handy (protein, fiber, low carbs)

## 2013-10-26 NOTE — Progress Notes (Signed)
  Patient was seen on 10/26/2013 for their 3 month follow-up as a part of the diabetes self-management courses at the [facility]. The following learning objectives were met by your patient during this course:  Patient was seen on 06/20/13 for the complete diabetes self-management series at the [facility].  Current A1c = 7.3% in Februrary per patient (down from 8.1% in September)   Your patient has established the following 4 month goals in their individualized success plan:  Reduce fat in my diet by eating less fried food - states that she cut back on fried foods a lot  Be active 10 minutes or more 2 times a week - has been more active (except during bad weather_  Your patient has identified these potential barriers to change:  Working overtime  - not working as much as before  Patient self reports the following:  Diabetes control has improved since diabetes self-management training: Yes, testing every day averaging 152-167 mg/dl various times of the day Number of days blood glucose is >200: only when was sick a few weeks ago Last MD appointment for diabetes: have an appointment to see doctor in 2 weeks  Changes in treatment plan: No Confidence with ability to manage diabetes: feels pretty confident Areas for improvement with diabetes self-care: feels like she could add more activity and staying motivated to follow the diet Willingness to participate in diabetes support group: not able to since she is watching grandchild on Monday nights  Goals:  Follow Diabetes Meal Plan as instructed  Eat 3 meals and 2 snacks, every 3-5 hrs  Limit carbohydrate intake to 45 grams carbohydrate/meal  Limit carbohydrate intake to 15 grams carbohydrate/snack  Add lean protein foods to meals/snacks  Monitor glucose levels as instructed by your doctor  Aim for 30 mins of physical activity daily  Bring food record and glucose log to your  next nutrition visit  Portion out snacks ahead of time to help not eat too many carbs and pair with protein  Consider keeping [facility] Protein bar handy (protein, fiber, low carbs)  Follow-Up Plan: Patient to call and schedule as needed.

## 2013-11-13 ENCOUNTER — Ambulatory Visit (INDEPENDENT_AMBULATORY_CARE_PROVIDER_SITE_OTHER): Payer: 59 | Admitting: Family Medicine

## 2013-11-13 VITALS — BP 124/66 | HR 74 | Temp 97.7°F | Ht 59.0 in | Wt 162.2 lb

## 2013-11-13 DIAGNOSIS — E119 Type 2 diabetes mellitus without complications: Secondary | ICD-10-CM

## 2013-11-13 LAB — POCT CBC
Granulocyte percent: 65.6 %G (ref 37–80)
HCT, POC: 43.1 % (ref 37.7–47.9)
Hemoglobin: 14.1 g/dL (ref 12.2–16.2)
Lymph, poc: 2 (ref 0.6–3.4)
MCH, POC: 26.9 pg — AB (ref 27–31.2)
MCHC: 32.7 g/dL (ref 31.8–35.4)
MCV: 82.3 fL (ref 80–97)
MPV: 8.8 fL (ref 0–99.8)
POC Granulocyte: 4.3 (ref 2–6.9)
POC LYMPH PERCENT: 30.9 %L (ref 10–50)
Platelet Count, POC: 215 10*3/uL (ref 142–424)
RBC: 5.2 M/uL (ref 4.04–5.48)
RDW, POC: 13.3 %
WBC: 6.5 10*3/uL (ref 4.6–10.2)

## 2013-11-13 LAB — POCT GLYCOSYLATED HEMOGLOBIN (HGB A1C): Hemoglobin A1C: 8.4

## 2013-11-14 LAB — CMP14+EGFR
ALT: 29 IU/L (ref 0–32)
AST: 34 IU/L (ref 0–40)
Albumin/Globulin Ratio: 2.5 (ref 1.1–2.5)
Albumin: 4.7 g/dL (ref 3.6–4.8)
Alkaline Phosphatase: 75 IU/L (ref 39–117)
BUN/Creatinine Ratio: 20 (ref 11–26)
BUN: 13 mg/dL (ref 8–27)
CO2: 24 mmol/L (ref 18–29)
Calcium: 9 mg/dL (ref 8.7–10.3)
Chloride: 98 mmol/L (ref 97–108)
Creatinine, Ser: 0.65 mg/dL (ref 0.57–1.00)
GFR calc Af Amer: 112 mL/min/{1.73_m2} (ref >59)
GFR calc non Af Amer: 97 mL/min/{1.73_m2} (ref >59)
Globulin, Total: 1.9 g/dL (ref 1.5–4.5)
Glucose: 166 mg/dL — ABNORMAL HIGH (ref 65–99)
Potassium: 4.5 mmol/L (ref 3.5–5.2)
Sodium: 137 mmol/L (ref 134–144)
Total Bilirubin: 0.6 mg/dL (ref 0.0–1.2)
Total Protein: 6.6 g/dL (ref 6.0–8.5)

## 2013-11-16 NOTE — Progress Notes (Signed)
   Subjective:    [name], female    DOB: [birth year], 63 y.o.   MRN: [id number]  HPI  This 63 y.o. female presents for evaluation of diabetes visit.  Review of Systems No chest pain, SOB, HA, dizziness, vision change, N/V, diarrhea, constipation, dysuria, urinary urgency or frequency, myalgias, arthralgias or rash.     Objective:   Physical Exam  Vital signs noted  Well developed well nourished female.  HEENT - Head atraumatic Normocephalic                Eyes - PERRLA, Conjuctiva - clear Sclera- Clear EOMI                Ears - EAC's Wnl TM's Wnl Gross Hearing WNL                Throat - oropharanx wnl Respiratory - Lungs CTA bilateral Cardiac - RRR S1 and S2 without murmur GI - Abdomen soft Nontender and bowel sounds active x 4 Extremities - No edema. Neuro - Grossly intact.  Results for orders placed in visit on 11/13/13  CMP14+EGFR      Result Value Ref Range   Glucose 166 (*) 65 - 99 mg/dL   BUN 13  8 - 27 mg/dL   Creatinine, Ser 0.65  0.57 - 1.00 mg/dL   GFR calc non Af Amer 97  >59 mL/min/1.73   GFR calc Af Amer 112  >59 mL/min/1.73   BUN/Creatinine Ratio 20  11 - 26   Sodium 137  134 - 144 mmol/L   Potassium 4.5  3.5 - 5.2 mmol/L   Chloride 98  97 - 108 mmol/L   CO2 24  18 - 29 mmol/L   Calcium 9.0  8.7 - 10.3 mg/dL   Total Protein 6.6  6.0 - 8.5 g/dL   Albumin 4.7  3.6 - 4.8 g/dL   Globulin, Total 1.9  1.5 - 4.5 g/dL   Albumin/Globulin Ratio 2.5  1.1 - 2.5   Total Bilirubin 0.6  0.0 - 1.2 mg/dL   Alkaline Phosphatase 75  39 - 117 IU/L   AST 34  0 - 40 IU/L   ALT 29  0 - 32 IU/L  POCT CBC      Result Value Ref Range   WBC 6.5  4.6 - 10.2 K/uL   Lymph, poc 2.0  0.6 - 3.4   POC LYMPH PERCENT 30.9  10 - 50 %L   POC Granulocyte 4.3  2 - 6.9   Granulocyte percent 65.6  37 - 80 %G   RBC 5.2  4.04 - 5.48 M/uL   Hemoglobin 14.1  12.2 - 16.2 g/dL   HCT, POC 43.1  37.7 - 47.9 %   MCV 82.3  80 - 97 fL   MCH, POC 26.9 (*) 27 - 31.2 pg   MCHC 32.7  31.8 - 35.4 g/dL   RDW, POC 13.3     Platelet Count, POC 215.0  142 - 424 K/uL   MPV 8.8  0 - 99.8 fL  POCT GLYCOSYLATED HEMOGLOBIN (HGB A1C)      Result Value Ref Range   Hemoglobin A1C 8.4        Assessment & Plan:  Diabetes - Plan: POCT CBC, CMP14+EGFR, POCT glycosylated hemoglobin (Hb A1C) Increase levemir insulin one unit a day until fsbs less than or equal to 120.  Lysbeth Penner FNP

## 2014-01-26 ENCOUNTER — Ambulatory Visit: Payer: 59 | Admitting: Dietician

## 2014-02-16 ENCOUNTER — Other Ambulatory Visit: Payer: Self-pay | Admitting: Family Medicine

## 2014-02-26 ENCOUNTER — Other Ambulatory Visit: Payer: Self-pay

## 2014-02-26 DIAGNOSIS — Z1231 Encounter for screening mammogram for malignant neoplasm of breast: Secondary | ICD-10-CM

## 2014-03-18 ENCOUNTER — Ambulatory Visit
Admission: RE | Admit: 2014-03-18 | Discharge: 2014-03-18 | Disposition: A | Discharge status: Home / Self Care | Payer: 59 | Source: Ambulatory Visit

## 2014-03-18 ENCOUNTER — Encounter (INDEPENDENT_AMBULATORY_CARE_PROVIDER_SITE_OTHER): Payer: Self-pay

## 2014-03-18 DIAGNOSIS — Z1231 Encounter for screening mammogram for malignant neoplasm of breast: Secondary | ICD-10-CM

## 2014-05-06 ENCOUNTER — Ambulatory Visit (INDEPENDENT_AMBULATORY_CARE_PROVIDER_SITE_OTHER): Payer: 59 | Admitting: Family Medicine

## 2014-05-06 ENCOUNTER — Encounter: Payer: Self-pay | Admitting: Family Medicine

## 2014-05-06 VITALS — BP 151/71 | HR 77 | Temp 97.7°F | Ht 59.0 in | Wt 162.4 lb

## 2014-05-06 DIAGNOSIS — I158 Other secondary hypertension: Secondary | ICD-10-CM

## 2014-05-06 DIAGNOSIS — E118 Type 2 diabetes mellitus with unspecified complications: Principal | ICD-10-CM

## 2014-05-06 DIAGNOSIS — R82998 Other abnormal findings in urine: Secondary | ICD-10-CM

## 2014-05-06 DIAGNOSIS — IMO0002 Reserved for concepts with insufficient information to code with codable children: Secondary | ICD-10-CM

## 2014-05-06 DIAGNOSIS — E1165 Type 2 diabetes mellitus with hyperglycemia: Secondary | ICD-10-CM

## 2014-05-06 DIAGNOSIS — I159 Secondary hypertension, unspecified: Secondary | ICD-10-CM

## 2014-05-06 DIAGNOSIS — B351 Tinea unguium: Secondary | ICD-10-CM

## 2014-05-06 LAB — POCT CBC
Granulocyte percent: 64.5 %G (ref 37–80)
HCT, POC: 38.4 % (ref 37.7–47.9)
Hemoglobin: 12.9 g/dL (ref 12.2–16.2)
Lymph, poc: 1.9 (ref 0.6–3.4)
MCH, POC: 27.8 pg (ref 27–31.2)
MCHC: 33.8 g/dL (ref 31.8–35.4)
MCV: 82.5 fL (ref 80–97)
MPV: 8.7 fL (ref 0–99.8)
POC Granulocyte: 3.8 (ref 2–6.9)
POC LYMPH PERCENT: 32 %L (ref 10–50)
Platelet Count, POC: 201 10*3/uL (ref 142–424)
RBC: 4.7 M/uL (ref 4.04–5.48)
RDW, POC: 13.2 %
WBC: 5.9 10*3/uL (ref 4.6–10.2)

## 2014-05-06 LAB — POCT UA - MICROSCOPIC ONLY
Bacteria, U Microscopic: NEGATIVE
Casts, Ur, LPF, POC: NEGATIVE
Crystals, Ur, HPF, POC: NEGATIVE
Mucus, UA: NEGATIVE
RBC, urine, microscopic: NEGATIVE
Yeast, UA: NEGATIVE

## 2014-05-06 LAB — POCT URINALYSIS DIPSTICK
Bilirubin, UA: NEGATIVE
Blood, UA: NEGATIVE
Glucose, UA: NEGATIVE
Ketones, UA: NEGATIVE
Nitrite, UA: NEGATIVE
Protein, UA: NEGATIVE
Spec Grav, UA: 1.015
Urobilinogen, UA: NEGATIVE
pH, UA: 5

## 2014-05-06 LAB — POCT UA - MICROALBUMIN: Microalbumin Ur, POC: 20 mg/L

## 2014-05-06 LAB — POCT GLYCOSYLATED HEMOGLOBIN (HGB A1C): Hemoglobin A1C: 7.4

## 2014-05-06 MED ORDER — LISINOPRIL 5 MG PO TABS: 5.0000 mg | ORAL_TABLET | Freq: Every day | ORAL | Status: DC

## 2014-05-06 NOTE — Addendum Note (Signed)
Addended by: Pollyann Kennedy F on: 05/06/2014 10:28 AM   Modules accepted: Orders

## 2014-05-06 NOTE — Progress Notes (Signed)
Subjective:    [name], female    DOB: 21-Jan-1951, 63 y.o.   MRN: [id number]  HPI This 63 y.o. female presents for evaluation of diabetes.  She has been using 80 units of levemir qd and reports her fsbs running 130-180 in am fasting.  She is not on ACE and has elevated bp.  She is going to schedule her eye exam.  She had colonoscopy 3 years ago and has hx of polyposis she states. She denies any acute medical problems.   Review of Systems    No chest pain, SOB, HA, dizziness, vision change, N/V, diarrhea, constipation, dysuria, urinary urgency or frequency, myalgias, arthralgias or rash.  Objective:   Physical Exam  Vital signs noted  Well developed well nourished female.  HEENT - Head atraumatic Normocephalic                Eyes - PERRLA, Conjuctiva - clear Sclera- Clear EOMI                Ears - EAC's Wnl TM's Wnl Gross Hearing WNL                Nose - Nares patent                 Throat - oropharanx wnl Respiratory - Lungs CTA bilateral Cardiac - RRR S1 and S2 without murmur GI - Abdomen soft Nontender and bowel sounds active x 4 Extremities - No edema. Neuro - Grossly intact. Feet - onychomycosis bilateral toes  Results for orders placed in visit on 05/06/14  POCT UA - MICROALBUMIN      Result Value Ref Range   Microalbumin Ur, POC 20    POCT UA - MICROSCOPIC ONLY      Result Value Ref Range   WBC, Ur, HPF, POC 5-10     RBC, urine, microscopic negative     Bacteria, U Microscopic negative     Mucus, UA negative     Epithelial cells, urine per micros occ     Crystals, Ur, HPF, POC negative     Casts, Ur, LPF, POC negative     Yeast, UA negative    POCT URINALYSIS DIPSTICK      Result Value Ref Range   Color, UA yellow     Clarity, UA clear     Glucose, UA negative     Bilirubin, UA negative     Ketones, UA negative     Spec Grav, UA 1.015     Blood, UA negative     pH, UA 5.0     Protein, UA negative     Urobilinogen, UA negative       Nitrite, UA negative     Leukocytes, UA Trace    POCT GLYCOSYLATED HEMOGLOBIN (HGB A1C)      Result Value Ref Range   Hemoglobin A1C 7.4        Assessment & Plan:  Type II or unspecified type diabetes mellitus with unspecified complication, uncontrolled - Plan: lisinopril (PRINIVIL,ZESTRIL) 5 MG tablet, POCT UA - Microalbumin, POCT UA - Microscopic Only, POCT urinalysis dipstick, POCT CBC, POCT glycosylated hemoglobin (Hb A1C), Lipid panel, CMP14+EGFR, TSH, Vit D  25 hydroxy (rtn osteoporosis monitoring), Ambulatory referral to Podiatry Diabetes under control and continue current regimen.  Secondary hypertension, unspecified - Plan: lisinopril (PRINIVIL,ZESTRIL) 5 MG tablet, POCT UA - Microalbumin, POCT UA - Microscopic Only, POCT urinalysis dipstick, POCT CBC, POCT glycosylated hemoglobin (Hb A1C), Lipid  panel, CMP14+EGFR, TSH, Vit D  25 hydroxy (rtn osteoporosis monitoring)  HTN - lisinopril 64m one po qd  Proteinnuria - lisinopril 571mpo qd  Onychomycosis - Plan: Ambulatory referral to Podiatry  Leukocytes in urine - Plan: Urine culture  Follow up in 3 months  WiLysbeth PennerNP

## 2014-05-07 ENCOUNTER — Other Ambulatory Visit: Payer: Self-pay | Admitting: Family Medicine

## 2014-05-07 DIAGNOSIS — E559 Vitamin D deficiency, unspecified: Secondary | ICD-10-CM

## 2014-05-07 LAB — CMP14+EGFR
ALT: 32 IU/L (ref 0–32)
AST: 32 IU/L (ref 0–40)
Albumin/Globulin Ratio: 2.1 (ref 1.1–2.5)
Albumin: 4.4 g/dL (ref 3.6–4.8)
Alkaline Phosphatase: 71 IU/L (ref 39–117)
BUN/Creatinine Ratio: 15 (ref 11–26)
BUN: 10 mg/dL (ref 8–27)
CO2: 21 mmol/L (ref 18–29)
Calcium: 8.3 mg/dL — ABNORMAL LOW (ref 8.7–10.3)
Chloride: 101 mmol/L (ref 97–108)
Creatinine, Ser: 0.66 mg/dL (ref 0.57–1.00)
GFR calc Af Amer: 110 mL/min/{1.73_m2} (ref >59)
GFR calc non Af Amer: 96 mL/min/{1.73_m2} (ref >59)
Globulin, Total: 2.1 g/dL (ref 1.5–4.5)
Glucose: 170 mg/dL — ABNORMAL HIGH (ref 65–99)
Potassium: 4.1 mmol/L (ref 3.5–5.2)
Sodium: 141 mmol/L (ref 134–144)
Total Bilirubin: 0.6 mg/dL (ref 0.0–1.2)
Total Protein: 6.5 g/dL (ref 6.0–8.5)

## 2014-05-07 LAB — LIPID PANEL
Chol/HDL Ratio: 5.9 ratio units — ABNORMAL HIGH (ref 0.0–4.4)
Cholesterol, Total: 199 mg/dL (ref 100–199)
HDL: 34 mg/dL — ABNORMAL LOW (ref >39)
LDL Calculated: 103 mg/dL — ABNORMAL HIGH (ref 0–99)
Triglycerides: 309 mg/dL — ABNORMAL HIGH (ref 0–149)
VLDL Cholesterol Cal: 62 mg/dL — ABNORMAL HIGH (ref 5–40)

## 2014-05-07 LAB — URINE CULTURE

## 2014-05-07 LAB — MICROALBUMIN, URINE: Microalbumin, Urine: 9.5 ug/mL (ref 0.0–17.0)

## 2014-05-07 LAB — TSH: TSH: 0.255 u[IU]/mL — ABNORMAL LOW (ref 0.450–4.500)

## 2014-05-07 LAB — VITAMIN D 25 HYDROXY (VIT D DEFICIENCY, FRACTURES): Vit D, 25-Hydroxy: 19.6 ng/mL — ABNORMAL LOW (ref 30.0–100.0)

## 2014-05-07 MED ORDER — LEVOTHYROXINE SODIUM 112 MCG PO TABS: ORAL_TABLET | ORAL | Status: DC

## 2014-05-07 MED ORDER — FENOFIBRATE 48 MG PO TABS: 48.0000 mg | ORAL_TABLET | Freq: Every day | ORAL | Status: DC

## 2014-05-07 MED ORDER — VITAMIN D (ERGOCALCIFEROL) 1.25 MG (50000 UNIT) PO CAPS: 50000.0000 [IU] | ORAL_CAPSULE | ORAL | Status: DC

## 2014-05-10 ENCOUNTER — Telehealth: Payer: Self-pay | Admitting: *Deleted

## 2014-05-10 NOTE — Telephone Encounter (Signed)
Message copied by Rolena Infante on Mon May 10, 2014  9:40 AM ------      Message from: Lysbeth Penner      Created: Fri May 07, 2014  6:49 PM       Vitamin D low and rx sent in to pharm.  TSH low and rx for levothyroxine 112 po qod rotate with levothyroxine 125mg  po qod and recheck TSH next visit.  Hgbaic elevated and glipizide was increased to bid and watch carbs in diet. ------

## 2014-05-14 ENCOUNTER — Other Ambulatory Visit: Payer: Self-pay | Admitting: Family Medicine

## 2014-06-04 ENCOUNTER — Telehealth: Payer: Self-pay | Admitting: Family Medicine

## 2014-06-04 DIAGNOSIS — E039 Hypothyroidism, unspecified: Secondary | ICD-10-CM

## 2014-06-04 MED ORDER — BLOOD GLUCOSE TEST VI STRP: 1.0000 [IU] | ORAL_STRIP | Freq: Two times a day (BID) | Status: DC

## 2014-06-04 MED ORDER — LEVOTHYROXINE SODIUM 125 MCG PO TABS: 125.0000 ug | ORAL_TABLET | ORAL | Status: DC

## 2014-06-04 NOTE — Telephone Encounter (Signed)
Sent over prescriptions electronically. Patient aware.

## 2014-06-21 ENCOUNTER — Other Ambulatory Visit: Payer: Self-pay | Admitting: Family Medicine

## 2014-07-19 ENCOUNTER — Other Ambulatory Visit: Payer: Self-pay | Admitting: Family Medicine

## 2014-08-02 ENCOUNTER — Ambulatory Visit (INDEPENDENT_AMBULATORY_CARE_PROVIDER_SITE_OTHER): Payer: Self-pay | Admitting: Family Medicine

## 2014-08-02 ENCOUNTER — Telehealth: Payer: Self-pay | Admitting: Family Medicine

## 2014-08-02 VITALS — BP 118/60 | Wt 160.0 lb

## 2014-08-02 DIAGNOSIS — E119 Type 2 diabetes mellitus without complications: Secondary | ICD-10-CM | POA: Insufficient documentation

## 2014-08-02 NOTE — Assessment & Plan Note (Signed)
Subjective:  Patient presents today for annual pharmacy visit as part of the employer-sponsored Link to Wellness program. Current diabetes regimen includes Levemir 80 units SQ once daily, Januvia 100 mg daily and metformin 1000 mg BID. Patient also continues on daily ASA, and ACEi. No med changes or major health changes at this time.   She reports that things have been going OK since her last visit with Lenna Sciara, Therapist, sports. Her last visit with FNP was in September. At that visit lisinopril was started. Since last LTW visit in October, no medication changes. Last A1C was 7.4% in September. She doesn't have an appointment pending to go back and see diabetes provider but states that she needs to make an appointment.  She tried taking fenofibrate in September and could not tolerate it due to upset stomach.    Advanced Care Planning: Date Discussed 05/20/2013; No Living Will in place; No Advanced Directive; Information was given Form given  Disease Assessments:  Diabetes: Type of Diabetes: Type 2; Sees Diabetes provider 2 times per year; hypoglycemia frequency none; checks feet daily; What is target A1c? 7.0 %; Year of diagnosis 2009; Diabetes Education 06/20/13 Completed required class at [facility]; MD managing Diabetes Stevan Born FNP; What are the signs of hyperglycemia? High blood glucose; uses glucometer TrueResult glucometer; checks blood glucose once daily Varies times fasting and after meals; takes an aspirin a day;   Lowest CBG 92; Highest CBG 192 didn't wait 2 hours before checking.; does not take medications as prescribed Non Adherentsometimes does not take her Levemir when she is working at night; Other Diabetes History:   Patient did not bring meter with her to visit. She states that she is checking at least once daily, sometimes she is checking twice daily. Mostly this is a fasting reading, sometimes it is not. Denies hypoglycemia.  She states that in the last  couple of weeks since she has been walking again fasting blood sugars have been around 100. When she wasn't walking fasting readings were in the 130-140s or in the 150s.  A1C today was 7.7%.  Patient is taking Levemir 80 units daily, metformin 1000 mg BID, Januvia 100 mg daily. Regimen could be improved by changing Januvia to a GLP-1 agonist like Victoza. Regimen could also be improved by increased adherence to Levemir.      Past Medical/Surgical History:  The patient has a past medical history of Hyperthyroidism.   Prior Surgery: Thyroidectomy 2008  C-section 29 yrs ago   Tobacco Assessment: Smoking Status: Former smoker; Last Reviewed: 08/02/2014  Pack-years: 7; smoked for 10; Date quit smoking: 2002   Social History:  Denies alcohol use; Caffeine use: tea soda 4 per day Daily; No drug use; Social work consult was not done; Patient can afford medications;   Patient knows the purpose/use of medications; Diet adherence 50-75% of the time She is trying to cut out fried foods. She is eating greek yogurt at work now.;   Exercise adherence 1-2 days a week; 90 minutes of exercise per week;   Medication adherence adherent Sometimes misses taking Levemir when working. She estimates that she is missing 1 dose a week when she works nights. She will remember and take it in the morning when she gets home..   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 For a few weeks she was just doing floor exercises. She restarted walking about 3 weeks ago and she is up to 2 miles twice a week for 45 minutes each time.  Nutrition- She states that she has been doing better with portion sizes and is trying to count carbs, but states that it is difficult. She isn't spooning out as much food on her plate. She  states that on the nights she isn't working she isn't eating past 10 PM. This has eliminated some snacking. She is working 3rd shift. B- boiled eggs, 1 slice dry toast; piece of fruit; coffee with splenda   Preventive Care:  Last Complete Physical: 05/06/2014  Hemoglobin A1c: 08/02/2014 via POCT 7.7  Blood Glucose 05/05/2013    Colonoscopy: 12/06/2011  Dilated Eye Exam: 05/25/2011  Flu vaccine: 05/20/2014  Foot Exam: 05/25/2014  Lipid Panel: 05/06/2014  Last mammography: 03/18/2014  Other Preventive Care Notes:  Plan to have eye exam      Vital Signs:  08/02/2014 9:12 AM (EST)Blood Pressure 118 / 60 mm/HgBMI 32.3; Height 4 ft 11 in; Weight 160 lbs   Testing:  Blood Sugar Tests: Hemoglobin A1c: 7.7 via POCT resulted on 08/02/2014   Historical Lab Results:  Urine Microalbumin: 05/06/14   Care Planning:  Learning Preference Assessment:  Learner: Patient  Readiness to Learn Barriers: None  Teaching Method: Demonstration, Explanation, Handout  Evaluation of Learning: Can function independently and verbalize knowledge  Readiness to Change:  How important is your health to you? 10  How confident are you in working to improve your health? 8  How ready are you to change to improve your health? 9  Total Score: 9  Care Plan:  08/02/2014 9:12 AM (EST) (1)  Problem: Elevated blood sugars  Role: Care Management Coordinator  Long Term Goal Member will check blood sugars daily with goals of 80-130 fasting and <180 1 1/2-2 hours after eating by next visit Interventions:  Reinforced CHO counting and to include protein with meals and snacks  Discussed importance of eating breakfast and how her liver makes sugar  Instructed on how Metformin helps the liver to make less sugar  Reinforced importance of regular exercise for glycemic control  Date Started: 06/17/2014  Short Term Goal #1: Plan to schedule eye appt by next week Patient has not made an appointment yet 08/02/14    Date  Started: 06/17/2014       Assessment/Plan: Patient is a 63 year old female with DM2. A1C today was 7.7% and is above goal of less than 7%. It is also increased from 7.4% in September. Patient states that she wasn't exercising for almost the entire month of November and she thinks this is why A1C has increased. She states that she has noticed when she walks and is consistent with physical activity her fasting blood sugars are at goal of 80-120 (around 100-110). I confirmed to patient that physical activity does play a role in lowering blood glucose and encouraged her to be more consistent with walking each week.  Reviewed carbohydrate counting with patient. She has a good knowledge of what foods contains carbohydrates. We reviewed what makes up a serving, how to estimate serving size and how to use a nutrition label to find carbohydrate information. I also showed her how to look up nutrition information for foods using google. She has cut back on portion sizes since her last appointment with Melissa. Weight is down 4 pounds also since her last visit.  Patient has had trouble remembering to take Levemir at the same time each day, especially on the days that she works nights. We set a goal to change administration time to 6 PM every evening as this is just prior to the time that she goes to work each day so she should be home to remember dose.  I will fax A1C results to patient's diabetes provider, Stevan Born, FNP. Patient will follow up in January with Melissa.  Goals for Next Visit- 1. Start taking Levemir at 6 PM at night every night. Remember to take it before you go to work. Leave 1 pen in your pocket book so you can take it in case you forget before you leave for work. 2. Schedule an appointment for a yearly eye exam. 3. Increase physical activity to three times a week. 4. To get nutrition information for common foods, go to google.com and type in the name of the food and then nutrition after  the name. This will pull up a food label. Next appointment to see Lenna Sciara is January 14th at 8:30 AM.

## 2014-08-11 ENCOUNTER — Other Ambulatory Visit: Payer: Self-pay | Admitting: *Deleted

## 2014-08-11 MED ORDER — SITAGLIPTIN PHOSPHATE 100 MG PO TABS: ORAL_TABLET | ORAL | Status: DC

## 2014-08-17 ENCOUNTER — Ambulatory Visit: Payer: 59 | Admitting: Physician Assistant

## 2014-08-28 LAB — HM DIABETES EYE EXAM

## 2014-08-30 ENCOUNTER — Encounter: Payer: Self-pay | Admitting: *Deleted

## 2014-09-02 NOTE — Progress Notes (Signed)
[name], female   DOB: 07/08/1951, 64 y.o.   MRN: [id number] Reviewed: Agree with the documentation and management of our [place].

## 2014-09-15 ENCOUNTER — Encounter: Payer: Self-pay | Admitting: Family Medicine

## 2014-09-15 ENCOUNTER — Ambulatory Visit (INDEPENDENT_AMBULATORY_CARE_PROVIDER_SITE_OTHER): Payer: 59 | Admitting: Family Medicine

## 2014-09-15 VITALS — BP 133/72 | HR 79 | Temp 98.2°F | Ht 59.0 in | Wt 161.0 lb

## 2014-09-15 DIAGNOSIS — E1165 Type 2 diabetes mellitus with hyperglycemia: Secondary | ICD-10-CM

## 2014-09-15 DIAGNOSIS — E039 Hypothyroidism, unspecified: Secondary | ICD-10-CM

## 2014-09-15 DIAGNOSIS — IMO0001 Reserved for inherently not codable concepts without codable children: Secondary | ICD-10-CM

## 2014-09-15 LAB — POCT GLYCOSYLATED HEMOGLOBIN (HGB A1C): Hemoglobin A1C: 7.9

## 2014-09-15 MED ORDER — INSULIN DETEMIR 100 UNIT/ML FLEXPEN: 70.0000 [IU] | PEN_INJECTOR | SUBCUTANEOUS | Status: DC

## 2014-09-15 NOTE — Progress Notes (Signed)
Subjective:    [name], female    DOB: 12-07-1950, 64 y.o.   MRN: [id number]  Diabetes She presents for her follow-up diabetic visit. She has type 2 diabetes mellitus. No MedicAlert identification noted. Her disease course has been worsening. There are no hypoglycemic associated symptoms. Pertinent negatives for hypoglycemia include no dizziness or headaches. Pertinent negatives for diabetes include no chest pain, no fatigue, no foot ulcerations, no polydipsia, no polyuria, no visual change, no weakness and no weight loss. There are no hypoglycemic complications. Symptoms are stable. Pertinent negatives for diabetic complications include no peripheral neuropathy or retinopathy. Risk factors for coronary artery disease include diabetes mellitus, dyslipidemia and hypertension.   No Known Allergies  Outpatient Encounter Prescriptions as of 09/15/2014  Medication Sig  . Glucose Blood (BLOOD GLUCOSE TEST STRIPS) STRP 1 Units by Other route 2 (two) times daily.  . Insulin Detemir 100 UNIT/ML SOPN Inject 60 Units into the skin 1 day or 1 dose.  . levothyroxine (SYNTHROID, LEVOTHROID) 112 MCG tablet Take qod and rotate with levothroxine 159mg po qod  . levothyroxine (SYNTHROID, LEVOTHROID) 125 MCG tablet Take 1 tablet (125 mcg total) by mouth every other day.  . lisinopril (PRINIVIL,ZESTRIL) 5 MG tablet Take 1 tablet (5 mg total) by mouth daily.  . metFORMIN (GLUCOPHAGE) 1000 MG tablet TAKE 1 TABLET BY MOUTH 2 TIMES DAILY WITH A MEAL.  .Marland KitchenNOVOFINE 30G X 8 MM MISC INJECT INTO THE SKIN AS DIRECTED AS NEEDED  . sitaGLIPtin (JANUVIA) 100 MG tablet TAKE 1 TABLET (100 MG TOTAL) BY MOUTH DAILY.  . [DISCONTINUED] fenofibrate (TRICOR) 48 MG tablet Take 1 tablet (48 mg total) by mouth daily. (Patient not taking: Reported on 08/02/2014)  . [DISCONTINUED] glipiZIDE (GLUCOTROL) 5 MG tablet Take 1 tablet (5 mg total) by mouth 2 (two) times daily before a meal. (Patient not taking: Reported on  09/15/2014)  . [DISCONTINUED] Vitamin D, Ergocalciferol, (DRISDOL) 50000 UNITS CAPS capsule Take 1 capsule (50,000 Units total) by mouth every 7 (seven) days. (Patient not taking: Reported on 09/15/2014)    Past Medical History  Diagnosis Date  . Diabetes mellitus   . Thyroid disease   . Arthritis   . Cancer     thyroid CA    Past Surgical History  Procedure Laterality Date  . Thyroidectomy  2009  . Cesarean section      History   Social History  . Marital Status: Divorced    Spouse Name: N/A    Number of Children: N/A  . Years of Education: N/A   Occupational History  . Not on file.   Social History Main Topics  . Smoking status: Former Smoker    Quit date: 08/23/2000  . Smokeless tobacco: Never Used  . Alcohol Use: No  . Drug Use: No  . Sexual Activity: Not on file   Other Topics Concern  . Not on file   Social History Narrative     Review of Systems  Constitutional: Negative for fever, chills, weight loss, diaphoresis, appetite change, fatigue and unexpected weight change.  HENT: Negative for congestion, ear pain, hearing loss, postnasal drip, rhinorrhea, sneezing, sore throat and trouble swallowing.   Eyes: Negative for pain.  Respiratory: Negative for cough, chest tightness and shortness of breath.   Cardiovascular: Negative for chest pain and palpitations.  Gastrointestinal: Negative for nausea, vomiting, abdominal pain, diarrhea and constipation.  Endocrine: Negative for polydipsia and polyuria.  Genitourinary: Negative for dysuria, frequency and menstrual problem.  Musculoskeletal:  Negative for joint swelling and arthralgias.  Skin: Negative for rash.  Neurological: Negative for dizziness, weakness, numbness and headaches.  Psychiatric/Behavioral: Negative for dysphoric mood and agitation.       Objective:   Physical Exam  Constitutional: She is oriented to person, place, and time. She appears well-developed and well-nourished. No distress.  HENT:   Head: Normocephalic and atraumatic.  Right Ear: External ear normal.  Left Ear: External ear normal.  Nose: Nose normal.  Mouth/Throat: Oropharynx is clear and moist.  Eyes: Conjunctivae and EOM are normal. Pupils are equal, round, and reactive to light.  Neck: Normal range of motion. Neck supple. No thyromegaly present.  Cardiovascular: Normal rate, regular rhythm and normal heart sounds.   No murmur heard. Pulmonary/Chest: Effort normal and breath sounds normal. No respiratory distress. She has no wheezes. She has no rales.  Abdominal: Soft. Bowel sounds are normal. She exhibits no distension. There is no tenderness.  Lymphadenopathy:    She has no cervical adenopathy.  Neurological: She is alert and oriented to person, place, and time. She has normal reflexes.  Skin: Skin is warm and dry.  Psychiatric: She has a normal mood and affect. Her behavior is normal. Judgment and thought content normal.   BP 133/72 mmHg  Pulse 79  Temp(Src) 98.2 F (36.8 C) (Oral)  Ht 4' 11"  (1.499 m)  Wt 161 lb (73.029 kg)  BMI 32.50 kg/m2          Assessment & Plan:   1. Uncontrolled diabetes mellitus type 2 without complications   2. Hypothyroidism, unspecified hypothyroidism type     Meds ordered this encounter  Medications  . Insulin Detemir (LEVEMIR) 100 UNIT/ML Pen    Sig: Inject 70 Units into the skin 1 day or 1 dose.    Dispense:  5 pen    Refill:  11    Orders Placed This Encounter  Procedures  . CMP14+EGFR  . TSH  . T4, Free  . POCT glycosylated hemoglobin (Hb A1C)    Labs pending Health Maintenance reviewed Diet and exercise encouraged Continue all meds as discussed Follow up in 3 mo  Claretta Fraise, MD

## 2014-09-15 NOTE — Patient Instructions (Signed)
Diabetes and Foot Care Diabetes may cause you to have problems because of poor blood supply (circulation) to your feet and legs. This may cause the skin on your feet to become thinner, break easier, and heal more slowly. Your skin may become dry, and the skin may peel and crack. You may also have nerve damage in your legs and feet causing decreased feeling in them. You may not notice minor injuries to your feet that could lead to infections or more serious problems. Taking care of your feet is one of the most important things you can do for yourself.  HOME CARE INSTRUCTIONS  Wear shoes at all times, even in the house. Do not go barefoot. Bare feet are easily injured.  Check your feet daily for blisters, cuts, and redness. If you cannot see the bottom of your feet, use a mirror or ask someone for help.  Wash your feet with warm water (do not use hot water) and mild soap. Then pat your feet and the areas between your toes until they are completely dry. Do not soak your feet as this can dry your skin.  Apply a moisturizing lotion or petroleum jelly (that does not contain alcohol and is unscented) to the skin on your feet and to dry, brittle toenails. Do not apply lotion between your toes.  Trim your toenails straight across. Do not dig under them or around the cuticle. File the edges of your nails with an emery board or nail file.  Do not cut corns or calluses or try to remove them with medicine.  Wear clean socks or stockings every day. Make sure they are not too tight. Do not wear knee-high stockings since they may decrease blood flow to your legs.  Wear shoes that fit properly and have enough cushioning. To break in new shoes, wear them for just a few hours a day. This prevents you from injuring your feet. Always look in your shoes before you put them on to be sure there are no objects inside.  Do not cross your legs. This may decrease the blood flow to your feet.  If you find a minor scrape,  cut, or break in the skin on your feet, keep it and the skin around it clean and dry. These areas may be cleansed with mild soap and water. Do not cleanse the area with peroxide, alcohol, or iodine.  When you remove an adhesive bandage, be sure not to damage the skin around it.  If you have a wound, look at it several times a day to make sure it is healing.  Do not use heating pads or hot water bottles. They may burn your skin. If you have lost feeling in your feet or legs, you may not know it is happening until it is too late.  Make sure your health care provider performs a complete foot exam at least annually or more often if you have foot problems. Report any cuts, sores, or bruises to your health care provider immediately. SEEK MEDICAL CARE IF:   You have an injury that is not healing.  You have cuts or breaks in the skin.  You have an ingrown nail.  You notice redness on your legs or feet.  You feel burning or tingling in your legs or feet.  You have pain or cramps in your legs and feet.  Your legs or feet are numb.  Your feet always feel cold. SEEK IMMEDIATE MEDICAL CARE IF:   There is increasing redness,   swelling, or pain in or around a wound.  There is a red line that goes up your leg.  Pus is coming from a wound.  You develop a fever or as directed by your health care provider.  You notice a bad smell coming from an ulcer or wound. Document Released: 08/03/2000 Document Revised: 04/08/2013 Document Reviewed: 01/13/2013 Exit[employer] Patient Information 2015 [employer]. This information is not intended to replace advice given to you by your health care provider. Make sure you discuss any questions you have with your health care provider. Diabetes and Exercise Exercising regularly is important. It is not just about losing weight. It has many health benefits, such as:  Improving your overall fitness, flexibility, and endurance.  Increasing your bone  density.  Helping with weight control.  Decreasing your body fat.  Increasing your muscle strength.  Reducing stress and tension.  Improving your overall health. People with diabetes who exercise gain additional benefits because exercise:  Reduces appetite.  Improves the body's use of blood sugar (glucose).  Helps lower or control blood glucose.  Decreases blood pressure.  Helps control blood lipids (such as cholesterol and triglycerides).  Improves the body's use of the hormone insulin by:  Increasing the body's insulin sensitivity.  Reducing the body's insulin needs.  Decreases the risk for heart disease because exercising:  Lowers cholesterol and triglycerides levels.  Increases the levels of good cholesterol (such as high-density lipoproteins [HDL]) in the body.  Lowers blood glucose levels. YOUR ACTIVITY PLAN  Choose an activity that you enjoy and set realistic goals. Your health care provider or diabetes educator can help you make an activity plan that works for you. Exercise regularly as directed by your health care provider. This includes:  Performing resistance training twice a week such as push-ups, sit-ups, lifting weights, or using resistance bands.  Performing 150 minutes of cardio exercises each week such as walking, running, or playing sports.  Staying active and spending no more than 90 minutes at one time being inactive. Even short bursts of exercise are good for you. Three 10-minute sessions spread throughout the day are just as beneficial as a single 30-minute session. Some exercise ideas include:  Taking the dog for a walk.  Taking the stairs instead of the elevator.  Dancing to your favorite song.  Doing an exercise video.  Doing your favorite exercise with a friend. RECOMMENDATIONS FOR EXERCISING WITH TYPE 1 OR TYPE 2 DIABETES   Check your blood glucose before exercising. If blood glucose levels are greater than 240 mg/dL, check for urine  ketones. Do not exercise if ketones are present.  Avoid injecting insulin into areas of the body that are going to be exercised. For example, avoid injecting insulin into:  The arms when playing tennis.  The legs when jogging.  Keep a record of:  Food intake before and after you exercise.  Expected peak times of insulin action.  Blood glucose levels before and after you exercise.  The type and amount of exercise you have done.  Review your records with your health care provider. Your health care provider will help you to develop guidelines for adjusting food intake and insulin amounts before and after exercising.  If you take insulin or oral hypoglycemic agents, watch for signs and symptoms of hypoglycemia. They include:  Dizziness.  Shaking.  Sweating.  Chills.  Confusion.  Drink plenty of water while you exercise to prevent dehydration or heat stroke. Body water is lost during exercise and must be   replaced.  Talk to your health care provider before starting an exercise program to make sure it is safe for you. Remember, almost any type of activity is better than none. Document Released: 10/27/2003 Document Revised: 12/21/2013 Document Reviewed: 01/13/2013 ExitCare Patient Information 2015 [employer]. This information is not intended to replace advice given to you by your health care provider. Make sure you discuss any questions you have with your health care provider. Basic Carbohydrate Counting for Diabetes Mellitus Carbohydrate counting is a method for keeping track of the amount of carbohydrates you eat. Eating carbohydrates naturally increases the level of sugar (glucose) in your blood, so it is important for you to know the amount that is okay for you to have in every meal. Carbohydrate counting helps keep the level of glucose in your blood within normal limits. The amount of carbohydrates allowed is different for every person. A dietitian can help you calculate the  amount that is right for you. Once you know the amount of carbohydrates you can have, you can count the carbohydrates in the foods you want to eat. Carbohydrates are found in the following foods:  Grains, such as breads and cereals.  Dried beans and soy products.  Starchy vegetables, such as potatoes, peas, and corn.  Fruit and fruit juices.  Milk and yogurt.  Sweets and snack foods, such as cake, cookies, candy, chips, soft drinks, and fruit drinks. CARBOHYDRATE COUNTING There are two ways to count the carbohydrates in your food. You can use either of the methods or a combination of both. Reading the "Nutrition Facts" on Packaged Food The "Nutrition Facts" is an area that is included on the labels of almost all packaged food and beverages in the [place]. It includes the serving size of that food or beverage and information about the nutrients in each serving of the food, including the grams (g) of carbohydrate per serving.  Decide the number of servings of this food or beverage that you will be able to eat or drink. Multiply that number of servings by the number of grams of carbohydrate that is listed on the label for that serving. The total will be the amount of carbohydrates you will be having when you eat or drink this food or beverage. Learning Standard Serving Sizes of Food When you eat food that is not packaged or does not include "Nutrition Facts" on the label, you need to measure the servings in order to count the amount of carbohydrates.A serving of most carbohydrate-rich foods contains about 15 g of carbohydrates. The following list includes serving sizes of carbohydrate-rich foods that provide 15 g ofcarbohydrate per serving:   1 slice of bread (1 oz) or 1 six-inch tortilla.    of a hamburger bun or English muffin.  4-6 crackers.   cup unsweetened dry cereal.    cup hot cereal.   cup rice or pasta.    cup mashed potatoes or  of a large baked potato.  1  cup fresh fruit or one small piece of fruit.    cup canned or frozen fruit or fruit juice.  1 cup milk.   cup plain fat-free yogurt or yogurt sweetened with artificial sweeteners.   cup cooked dried beans or starchy vegetable, such as peas, corn, or potatoes.  Decide the number of standard-size servings that you will eat. Multiply that number of servings by 15 (the grams of carbohydrates in that serving). For example, if you eat 2 cups of strawberries, you will have eaten   2 servings and 30 g of carbohydrates (2 servings x 15 g = 30 g). For foods such as soups and casseroles, in which more than one food is mixed in, you will need to count the carbohydrates in each food that is included. EXAMPLE OF CARBOHYDRATE COUNTING Sample Dinner  3 oz chicken breast.   cup of brown rice.   cup of corn.  1 cup milk.   1 cup strawberries with sugar-free whipped topping.  Carbohydrate Calculation Step 1: Identify the foods that contain carbohydrates:   Rice.   Corn.   Milk.   Strawberries. Step 2:Calculate the number of servings eaten of each:   2 servings of rice.   1 serving of corn.   1 serving of milk.   1 serving of strawberries. Step 3: Multiply each of those number of servings by 15 g:   2 servings of rice x 15 g = 30 g.   1 serving of corn x 15 g = 15 g.   1 serving of milk x 15 g = 15 g.   1 serving of strawberries x 15 g = 15 g. Step 4: Add together all of the amounts to find the total grams of carbohydrates eaten: 30 g + 15 g + 15 g + 15 g = 75 g. Document Released: 08/06/2005 Document Revised: 12/21/2013 Document Reviewed: 07/03/2013 ExitCare Patient Information 2015 [employer]. This information is not intended to replace advice given to you by your health care provider. Make sure you discuss any questions you have with your health care provider.  

## 2014-09-16 LAB — CMP14+EGFR
ALT: 27 IU/L (ref 0–32)
AST: 23 IU/L (ref 0–40)
Albumin/Globulin Ratio: 2 (ref 1.1–2.5)
Albumin: 4.4 g/dL (ref 3.6–4.8)
Alkaline Phosphatase: 73 IU/L (ref 39–117)
BILIRUBIN TOTAL: 0.6 mg/dL (ref 0.0–1.2)
BUN / CREAT RATIO: 17 (ref 11–26)
BUN: 11 mg/dL (ref 8–27)
CO2: 22 mmol/L (ref 18–29)
Calcium: 8.5 mg/dL — ABNORMAL LOW (ref 8.7–10.3)
Chloride: 97 mmol/L (ref 97–108)
Creatinine, Ser: 0.63 mg/dL (ref 0.57–1.00)
GFR calc Af Amer: 112 mL/min/{1.73_m2} (ref >59)
GFR calc non Af Amer: 97 mL/min/{1.73_m2} (ref >59)
Globulin, Total: 2.2 g/dL (ref 1.5–4.5)
Glucose: 138 mg/dL — ABNORMAL HIGH (ref 65–99)
POTASSIUM: 4 mmol/L (ref 3.5–5.2)
SODIUM: 137 mmol/L (ref 134–144)
Total Protein: 6.6 g/dL (ref 6.0–8.5)

## 2014-09-16 LAB — TSH: TSH: 0.801 u[IU]/mL (ref 0.450–4.500)

## 2014-09-16 LAB — T4, FREE: FREE T4: 1.77 ng/dL (ref 0.82–1.77)

## 2014-09-24 NOTE — Telephone Encounter (Signed)
Several attempts have been made to contact patient and no response. This encounter will be closed.

## 2014-11-02 ENCOUNTER — Ambulatory Visit (INDEPENDENT_AMBULATORY_CARE_PROVIDER_SITE_OTHER): Payer: 59 | Admitting: Family

## 2014-11-02 ENCOUNTER — Encounter: Payer: Self-pay | Admitting: Family

## 2014-11-02 VITALS — BP 145/72 | HR 85 | Temp 99.2°F | Ht 59.0 in | Wt 162.2 lb

## 2014-11-02 DIAGNOSIS — J019 Acute sinusitis, unspecified: Secondary | ICD-10-CM | POA: Diagnosis not present

## 2014-11-02 MED ORDER — AMOXICILLIN-POT CLAVULANATE 875-125 MG PO TABS
1.0000 | ORAL_TABLET | Freq: Two times a day (BID) | ORAL | Status: DC
Start: 2014-11-02 — End: 2014-11-02

## 2014-11-02 MED ORDER — AMOXICILLIN-POT CLAVULANATE 875-125 MG PO TABS: 1.0000 | ORAL_TABLET | Freq: Two times a day (BID) | ORAL | Status: DC

## 2014-11-02 NOTE — Patient Instructions (Addendum)
Sinusitis Sinusitis is redness, soreness, and inflammation of the paranasal sinuses. Paranasal sinuses are air pockets within the bones of your face (beneath the eyes, the middle of the forehead, or above the eyes). In healthy paranasal sinuses, mucus is able to drain out, and air is able to circulate through them by way of your nose. However, when your paranasal sinuses are inflamed, mucus and air can become trapped. This can allow bacteria and other germs to grow and cause infection. Sinusitis can develop quickly and last only a short time (acute) or continue over a long period (chronic). Sinusitis that lasts for more than 12 weeks is considered chronic.  CAUSES  Causes of sinusitis include:  Allergies.  Structural abnormalities, such as displacement of the cartilage that separates your nostrils (deviated septum), which can decrease the air flow through your nose and sinuses and affect sinus drainage.  Functional abnormalities, such as when the small hairs (cilia) that line your sinuses and help remove mucus do not work properly or are not present. SIGNS AND SYMPTOMS  Symptoms of acute and chronic sinusitis are the same. The primary symptoms are pain and pressure around the affected sinuses. Other symptoms include:  Upper toothache.  Earache.  Headache.  Bad breath.  Decreased sense of smell and taste.  A cough, which worsens when you are lying flat.  Fatigue.  Fever.  Thick drainage from your nose, which often is green and may contain pus (purulent).  Swelling and warmth over the affected sinuses. DIAGNOSIS  Your health care provider will perform a physical exam. During the exam, your health care provider may:  Look in your nose for signs of abnormal growths in your nostrils (nasal polyps).  Tap over the affected sinus to check for signs of infection.  View the inside of your sinuses (endoscopy) using an imaging device that has a light attached (endoscope). If your health  care provider suspects that you have chronic sinusitis, one or more of the following tests may be recommended:  Allergy tests.  Nasal culture. A sample of mucus is taken from your nose, sent to a lab, and screened for bacteria.  Nasal cytology. A sample of mucus is taken from your nose and examined by your health care provider to determine if your sinusitis is related to an allergy. TREATMENT  Most cases of acute sinusitis are related to a viral infection and will resolve on their own within 10 days. Sometimes medicines are prescribed to help relieve symptoms (pain medicine, decongestants, nasal steroid sprays, or saline sprays).  However, for sinusitis related to a bacterial infection, your health care provider will prescribe antibiotic medicines. These are medicines that will help kill the bacteria causing the infection.  Rarely, sinusitis is caused by a fungal infection. In theses cases, your health care provider will prescribe antifungal medicine. For some cases of chronic sinusitis, surgery is needed. Generally, these are cases in which sinusitis recurs more than 3 times per year, despite other treatments. HOME CARE INSTRUCTIONS   Drink plenty of water. Water helps thin the mucus so your sinuses can drain more easily.  Use a humidifier.  Inhale steam 3 to 4 times a day (for example, sit in the bathroom with the shower running).  Apply a warm, moist washcloth to your face 3 to 4 times a day, or as directed by your health care provider.  Use saline nasal sprays to help moisten and clean your sinuses.  Take medicines only as directed by your health care provider.    If you were prescribed either an antibiotic or antifungal medicine, finish it all even if you start to feel better. SEEK IMMEDIATE MEDICAL CARE IF:  You have increasing pain or severe headaches.  You have nausea, vomiting, or drowsiness.  You have swelling around your face.  You have vision problems.  You have a stiff  neck.  You have difficulty breathing. MAKE SURE YOU:   Understand these instructions.  Will watch your condition.  Will get help right away if you are not doing well or get worse. Document Released: 08/06/2005 Document Revised: 12/21/2013 Document Reviewed: 08/21/2011 ExitCare Patient Information 2015 [employer]. This information is not intended to replace advice given to you by your health care provider. Make sure you discuss any questions you have with your health care provider.  - Take meds as prescribed - Use a cool mist humidifier  -Use saline nose sprays frequently -Saline irrigations of the nose can be very helpful if done frequently.  * 4X daily for 1 week*  * Use of a nettie pot can be helpful with this. Follow directions with this* -Force fluids -For any cough or congestion  Use plain Mucinex- regular strength or max strength is fine   * Children- consult with Pharmacist for dosing -For fever or aces or pains- take tylenol or ibuprofen appropriate for age and weight.  * for fevers greater than 101 orally you may alternate ibuprofen and tylenol every  3 hours. -Throat lozenges if help   Monroe Qin, FNP  

## 2014-11-02 NOTE — Progress Notes (Signed)
Subjective:    [name], female    DOB: [birth year], 64 y.o.   MRN: [id number]  Sinusitis This is a new problem. The current episode started in the past 7 days (Saturday). The maximum temperature recorded prior to her arrival was 100.4 - 100.9 F. Her pain is at a severity of 7/10. The pain is mild. Associated symptoms include chills, congestion, coughing, ear pain, headaches, a hoarse voice, sinus pressure, sneezing, a sore throat and swollen glands. Pertinent negatives include no shortness of breath. Past treatments include oral decongestants and lying down. The treatment provided mild relief.  Sore Throat  Associated symptoms include congestion, coughing, ear pain, headaches, a hoarse voice and swollen glands. Pertinent negatives include no shortness of breath.  Fever  Associated symptoms include congestion, coughing, ear pain, headaches and a sore throat.      Review of Systems  Constitutional: Positive for fever and chills.  HENT: Positive for congestion, ear pain, hoarse voice, sinus pressure, sneezing and sore throat.   Eyes: Negative.   Respiratory: Positive for cough. Negative for shortness of breath.   Cardiovascular: Negative.  Negative for palpitations.  Gastrointestinal: Negative.   Endocrine: Negative.   Genitourinary: Negative.   Musculoskeletal: Negative.   Neurological: Positive for headaches.  Hematological: Negative.   Psychiatric/Behavioral: Negative.   All other systems reviewed and are negative.      Objective:   Physical Exam  Constitutional: She is oriented to person, place, and time. She appears well-developed and well-nourished. No distress.  HENT:  Head: Normocephalic and atraumatic.  Right Ear: External ear normal.  Left Ear: External ear normal.  Nose: Right sinus exhibits maxillary sinus tenderness and frontal sinus tenderness. Left sinus exhibits maxillary sinus tenderness and frontal sinus tenderness.  Nasal passage erythemas  with mild swelling  Oropharynx erytheams  Eyes: Pupils are equal, round, and reactive to light.  Neck: Normal range of motion. Neck supple. No thyromegaly present.  Cardiovascular: Normal rate, regular rhythm, normal heart sounds and intact distal pulses.   No murmur heard. Pulmonary/Chest: Effort normal and breath sounds normal. No respiratory distress. She has no wheezes.  Abdominal: Soft. Bowel sounds are normal. She exhibits no distension. There is no tenderness.  Musculoskeletal: Normal range of motion. She exhibits no edema or tenderness.  Neurological: She is alert and oriented to person, place, and time. She has normal reflexes. No cranial nerve deficit.  Skin: Skin is warm and dry.  Psychiatric: She has a normal mood and affect. Her behavior is normal. Judgment and thought content normal.  Vitals reviewed.     BP 145/72 mmHg  Pulse 85  Temp(Src) 99.2 F (37.3 C) (Oral)  Ht 4\' 11"  (1.499 m)  Wt 162 lb 3.2 oz (73.573 kg)  BMI 32.74 kg/m2     Assessment & Plan:  1. Acute sinusitis, recurrence not specified, unspecified location -- Take meds as prescribed - Use a cool mist humidifier  -Use saline nose sprays frequently -Saline irrigations of the nose can be very helpful if done frequently.  * 4X daily for 1 week*  * Use of a nettie pot can be helpful with this. Follow directions with this* -Force fluids -For any cough or congestion  Use plain Mucinex- regular strength or max strength is fine   * Children- consult with Pharmacist for dosing -For fever or aces or pains- take tylenol or ibuprofen appropriate for age and weight.  * for fevers greater than 101 orally you may alternate ibuprofen and  tylenol every  3 hours. -Throat lozenges if help - amoxicillin-clavulanate (AUGMENTIN) 875-125 MG per tablet; Take 1 tablet by mouth 2 (two) times daily.  Dispense: 14 tablet; Refill: 0  Evelina Dun, FNP

## 2014-11-11 ENCOUNTER — Other Ambulatory Visit: Payer: Self-pay

## 2014-11-11 NOTE — Patient Outreach (Signed)
   11/11/2014  Morgan Schmidt [birth year] [id number]  Morgan Schmidt is an 64 y.o. female  Subjective: Member states that she has had a sinus infection and nasal congestion.  States that she saw a provider and she gave her an antibiotic to take.  States she is slowly getting better.  States she had been exercising before she got sick and she plans to restart when she is feeling better.   Objective: Review of Systems  HENT: Positive for congestion.   All other systems reviewed and are negative.   Physical Exam  Filed Vitals:   11/11/14 0843  BP: 132/80  Pulse: 84    Current Medications: Current Outpatient Prescriptions  Medication Sig Dispense Refill  . amoxicillin-clavulanate (AUGMENTIN) 875-125 MG per tablet Take 1 tablet by mouth 2 (two) times daily. 14 tablet 0  . Glucose Blood (BLOOD GLUCOSE TEST STRIPS) STRP 1 Units by Other route 2 (two) times daily. 200 each 3  . Insulin Detemir (LEVEMIR) 100 UNIT/ML Pen Inject 70 Units into the skin 1 day or 1 dose. (Patient taking differently: Inject 80 Units into the skin 1 day or 1 dose. ) 5 pen 11  . levothyroxine (SYNTHROID, LEVOTHROID) 112 MCG tablet Take qod and rotate with levothroxine 182mcg po qod 45 tablet 3  . levothyroxine (SYNTHROID, LEVOTHROID) 125 MCG tablet Take 1 tablet (125 mcg total) by mouth every other day. 45 tablet 3  . lisinopril (PRINIVIL,ZESTRIL) 5 MG tablet Take 1 tablet (5 mg total) by mouth daily. 90 tablet 3  . metFORMIN (GLUCOPHAGE) 1000 MG tablet TAKE 1 TABLET BY MOUTH 2 TIMES DAILY WITH A MEAL. 60 tablet 3  . NOVOFINE 30G X 8 MM MISC INJECT INTO THE SKIN AS DIRECTED AS NEEDED 100 each 1  . sitaGLIPtin (JANUVIA) 100 MG tablet TAKE 1 TABLET (100 MG TOTAL) BY MOUTH DAILY. 90 tablet 0   No current facility-administered medications for this visit.      Fall/Depression Screening: PHQ 2/9 Scores 11/11/2014 09/15/2014 11/13/2013  PHQ - 2 Score 0 0 0    [facility] CM Care Plan        Patient Outreach from 11/11/2014  in [place] Problem One  Elevated blood sugars as evidenced by HgbA1C of 7.9   Care Plan for Problem One  Active   Interventions for Problem One Long Term Goal  Reviewed carbohydrate counting and portion control, Reinforced importance of exercise to lower blood sugars, Reviewed sick day instructions   [facility] Long Term Goal (31-90 days)  Member will decrease HgbA1C to  7.0 or below in the next 90 days   [facility] Long Term Goal Start Date  11/11/14      Assessment:    Member seen for [facility] program for self management of Type 2 DM.  Member continues to have elevated Hemoglobin A1C and may need to have medication adjustment.  Member to address with primary provider on 12/16/14 appt Plan:Plan to have [facility] follow up on 01/03/15 for follow up and to continue diabetic self management education.

## 2014-11-11 NOTE — Patient Instructions (Signed)
Plan to walk 3 times a week 20 minutes  Diabetes and Exercise Exercising regularly is important. It is not just about losing weight. It has many health benefits, such as:  Improving your overall fitness, flexibility, and endurance.  Increasing your bone density.  Helping with weight control.  Decreasing your body fat.  Increasing your muscle strength.  Reducing stress and tension.  Improving your overall health. People with diabetes who exercise gain additional benefits because exercise:  Reduces appetite.  Improves the body's use of blood sugar (glucose).  Helps lower or control blood glucose.  Decreases blood pressure.  Helps control blood lipids (such as cholesterol and triglycerides).  Improves the body's use of the hormone insulin by:  Increasing the body's insulin sensitivity.  Reducing the body's insulin needs.  Decreases the risk for heart disease because exercising:  Lowers cholesterol and triglycerides levels.  Increases the levels of good cholesterol (such as high-density lipoproteins [HDL]) in the body.  Lowers blood glucose levels. YOUR ACTIVITY PLAN  Choose an activity that you enjoy and set realistic goals. Your health care provider or diabetes educator can help you make an activity plan that works for you. Exercise regularly as directed by your health care provider. This includes:  Performing resistance training twice a week such as push-ups, sit-ups, lifting weights, or using resistance bands.  Performing 150 minutes of cardio exercises each week such as walking, running, or playing sports.  Staying active and spending no more than 90 minutes at one time being inactive. Even short bursts of exercise are good for you. Three 10-minute sessions spread throughout the day are just as beneficial as a single 30-minute session. Some exercise ideas include:  Taking the dog for a walk.  Taking the stairs instead of the elevator.  Dancing to your  favorite song.  Doing an exercise video.  Doing your favorite exercise with a friend. RECOMMENDATIONS FOR EXERCISING WITH TYPE 1 OR TYPE 2 DIABETES   Check your blood glucose before exercising. If blood glucose levels are greater than 240 mg/dL, check for urine ketones. Do not exercise if ketones are present.  Avoid injecting insulin into areas of the body that are going to be exercised. For example, avoid injecting insulin into:  The arms when playing tennis.  The legs when jogging.  Keep a record of:  Food intake before and after you exercise.  Expected peak times of insulin action.  Blood glucose levels before and after you exercise.  The type and amount of exercise you have done.  Review your records with your health care provider. Your health care provider will help you to develop guidelines for adjusting food intake and insulin amounts before and after exercising.  If you take insulin or oral hypoglycemic agents, watch for signs and symptoms of hypoglycemia. They include:  Dizziness.  Shaking.  Sweating.  Chills.  Confusion.  Drink plenty of water while you exercise to prevent dehydration or heat stroke. Body water is lost during exercise and must be replaced.  Talk to your health care provider before starting an exercise program to make sure it is safe for you. Remember, almost any type of activity is better than none. Document Released: 10/27/2003 Document Revised: 12/21/2013 Document Reviewed: 01/13/2013 [facility] Patient Information 2015 [place], [place]. This information is not intended to replace advice given to you by your health care provider. Make sure you discuss any questions you have with your health care provider.

## 2014-11-18 ENCOUNTER — Other Ambulatory Visit: Payer: Self-pay | Admitting: *Deleted

## 2014-11-18 MED ORDER — METFORMIN HCL 1000 MG PO TABS: ORAL_TABLET | ORAL | Status: DC

## 2014-11-18 MED ORDER — SITAGLIPTIN PHOSPHATE 100 MG PO TABS: ORAL_TABLET | ORAL | Status: DC

## 2014-12-16 ENCOUNTER — Ambulatory Visit (INDEPENDENT_AMBULATORY_CARE_PROVIDER_SITE_OTHER): Payer: 59 | Admitting: Family Medicine

## 2014-12-16 ENCOUNTER — Encounter: Payer: Self-pay | Admitting: Family Medicine

## 2014-12-16 VITALS — BP 126/75 | HR 75 | Temp 97.7°F | Ht <= 58 in | Wt 167.2 lb

## 2014-12-16 DIAGNOSIS — E1169 Type 2 diabetes mellitus with other specified complication: Secondary | ICD-10-CM

## 2014-12-16 DIAGNOSIS — E782 Mixed hyperlipidemia: Secondary | ICD-10-CM | POA: Diagnosis not present

## 2014-12-16 DIAGNOSIS — R5383 Other fatigue: Secondary | ICD-10-CM | POA: Diagnosis not present

## 2014-12-16 DIAGNOSIS — K635 Polyp of colon: Secondary | ICD-10-CM | POA: Diagnosis not present

## 2014-12-16 DIAGNOSIS — Z794 Long term (current) use of insulin: Secondary | ICD-10-CM | POA: Diagnosis not present

## 2014-12-16 DIAGNOSIS — E119 Type 2 diabetes mellitus without complications: Secondary | ICD-10-CM

## 2014-12-16 DIAGNOSIS — E038 Other specified hypothyroidism: Secondary | ICD-10-CM | POA: Diagnosis not present

## 2014-12-16 LAB — POCT GLYCOSYLATED HEMOGLOBIN (HGB A1C): HEMOGLOBIN A1C: 9

## 2014-12-16 LAB — POCT CBC
Granulocyte percent: 61.1 %G (ref 37–80)
HCT, POC: 42.5 % (ref 37.7–47.9)
Hemoglobin: 13.3 g/dL (ref 12.2–16.2)
LYMPH, POC: 2 (ref 0.6–3.4)
MCH: 26.2 pg — AB (ref 27–31.2)
MCHC: 31.3 g/dL — AB (ref 31.8–35.4)
MCV: 83.6 fL (ref 80–97)
MPV: 8.3 fL (ref 0–99.8)
POC GRANULOCYTE: 3.7 (ref 2–6.9)
POC LYMPH %: 32.3 % (ref 10–50)
Platelet Count, POC: 241 10*3/uL (ref 142–424)
RBC: 5.09 M/uL (ref 4.04–5.48)
RDW, POC: 14.1 %
WBC: 6.1 10*3/uL (ref 4.6–10.2)

## 2014-12-16 MED ORDER — INSULIN DETEMIR 100 UNIT/ML FLEXPEN: 90.0000 [IU] | PEN_INJECTOR | SUBCUTANEOUS | Status: DC

## 2014-12-16 NOTE — Patient Instructions (Signed)
Diabetes and Foot Care Diabetes may cause you to have problems because of poor blood supply (circulation) to your feet and legs. This may cause the skin on your feet to become thinner, break easier, and heal more slowly. Your skin may become dry, and the skin may peel and crack. You may also have nerve damage in your legs and feet causing decreased feeling in them. You may not notice minor injuries to your feet that could lead to infections or more serious problems. Taking care of your feet is one of the most important things you can do for yourself.  HOME CARE INSTRUCTIONS  Wear shoes at all times, even in the house. Do not go barefoot. Bare feet are easily injured.  Check your feet daily for blisters, cuts, and redness. If you cannot see the bottom of your feet, use a mirror or ask someone for help.  Wash your feet with warm water (do not use hot water) and mild soap. Then pat your feet and the areas between your toes until they are completely dry. Do not soak your feet as this can dry your skin.  Apply a moisturizing lotion or petroleum jelly (that does not contain alcohol and is unscented) to the skin on your feet and to dry, brittle toenails. Do not apply lotion between your toes.  Trim your toenails straight across. Do not dig under them or around the cuticle. File the edges of your nails with an emery board or nail file.  Do not cut corns or calluses or try to remove them with medicine.  Wear clean socks or stockings every day. Make sure they are not too tight. Do not wear knee-high stockings since they may decrease blood flow to your legs.  Wear shoes that fit properly and have enough cushioning. To break in new shoes, wear them for just a few hours a day. This prevents you from injuring your feet. Always look in your shoes before you put them on to be sure there are no objects inside.  Do not cross your legs. This may decrease the blood flow to your feet.  If you find a minor scrape,  cut, or break in the skin on your feet, keep it and the skin around it clean and dry. These areas may be cleansed with mild soap and water. Do not cleanse the area with peroxide, alcohol, or iodine.  When you remove an adhesive bandage, be sure not to damage the skin around it.  If you have a wound, look at it several times a day to make sure it is healing.  Do not use heating pads or hot water bottles. They may burn your skin. If you have lost feeling in your feet or legs, you may not know it is happening until it is too late.  Make sure your health care provider performs a complete foot exam at least annually or more often if you have foot problems. Report any cuts, sores, or bruises to your health care provider immediately. SEEK MEDICAL CARE IF:   You have an injury that is not healing.  You have cuts or breaks in the skin.  You have an ingrown nail.  You notice redness on your legs or feet.  You feel burning or tingling in your legs or feet.  You have pain or cramps in your legs and feet.  Your legs or feet are numb.  Your feet always feel cold. SEEK IMMEDIATE MEDICAL CARE IF:   There is increasing redness,   swelling, or pain in or around a wound.  There is a red line that goes up your leg.  Pus is coming from a wound.  You develop a fever or as directed by your health care provider.  You notice a bad smell coming from an ulcer or wound. Document Released: 08/03/2000 Document Revised: 04/08/2013 Document Reviewed: 01/13/2013 [facility] Patient Information 2015 [place], [place]. This information is not intended to replace advice given to you by your health care provider. Make sure you discuss any questions you have with your health care provider. Basic Carbohydrate Counting for Diabetes Mellitus Carbohydrate counting is a method for keeping track of the amount of carbohydrates you eat. Eating carbohydrates naturally increases the level of sugar (glucose) in your blood, so it is  important for you to know the amount that is okay for you to have in every meal. Carbohydrate counting helps keep the level of glucose in your blood within normal limits. The amount of carbohydrates allowed is different for every person. A dietitian can help you calculate the amount that is right for you. Once you know the amount of carbohydrates you can have, you can count the carbohydrates in the foods you want to eat. Carbohydrates are found in the following foods:  Grains, such as breads and cereals.  Dried beans and soy products.  Starchy vegetables, such as potatoes, peas, and corn.  Fruit and fruit juices.  Milk and yogurt.  Sweets and snack foods, such as cake, cookies, candy, chips, soft drinks, and fruit drinks. CARBOHYDRATE COUNTING There are two ways to count the carbohydrates in your food. You can use either of the methods or a combination of both. Reading the "Nutrition Facts" on [place] The "Nutrition Facts" is an area that is included on the labels of almost all packaged food and beverages in the [place]. It includes the serving size of that food or beverage and information about the nutrients in each serving of the food, including the grams (g) of carbohydrate per serving.  Decide the number of servings of this food or beverage that you will be able to eat or drink. Multiply that number of servings by the number of grams of carbohydrate that is listed on the label for that serving. The total will be the amount of carbohydrates you will be having when you eat or drink this food or beverage. Learning Standard Serving Sizes of Food When you eat food that is not packaged or does not include "Nutrition Facts" on the label, you need to measure the servings in order to count the amount of carbohydrates.A serving of most carbohydrate-rich foods contains about 15 g of carbohydrates. The following list includes serving sizes of carbohydrate-rich foods that provide 15 g  ofcarbohydrate per serving:   1 slice of bread (1 oz) or 1 six-inch tortilla.    of a hamburger bun or English muffin.  4-6 crackers.   cup unsweetened dry cereal.    cup hot cereal.   cup rice or pasta.    cup mashed potatoes or  of a large baked potato.  1 cup fresh fruit or one small piece of fruit.    cup canned or frozen fruit or fruit juice.  1 cup milk.   cup plain fat-free yogurt or yogurt sweetened with artificial sweeteners.   cup cooked dried beans or starchy vegetable, such as peas, corn, or potatoes.  Decide the number of standard-size servings that you will eat. Multiply that number of servings by 15 (  the grams of carbohydrates in that serving). For example, if you eat 2 cups of strawberries, you will have eaten 2 servings and 30 g of carbohydrates (2 servings x 15 g = 30 g). For foods such as soups and casseroles, in which more than one food is mixed in, you will need to count the carbohydrates in each food that is included. EXAMPLE OF CARBOHYDRATE COUNTING Sample Dinner  3 oz chicken breast.   cup of brown rice.   cup of corn.  1 cup milk.   1 cup strawberries with sugar-free whipped topping.  Carbohydrate Calculation Step 1: Identify the foods that contain carbohydrates:   Rice.   Corn.   Milk.   Strawberries. Step 2:Calculate the number of servings eaten of each:   2 servings of rice.   1 serving of corn.   1 serving of milk.   1 serving of strawberries. Step 3: Multiply each of those number of servings by 15 g:   2 servings of rice x 15 g = 30 g.   1 serving of corn x 15 g = 15 g.   1 serving of milk x 15 g = 15 g.   1 serving of strawberries x 15 g = 15 g. Step 4: Add together all of the amounts to find the total grams of carbohydrates eaten: 30 g + 15 g + 15 g + 15 g = 75 g. Document Released: 08/06/2005 Document Revised: 12/21/2013 Document Reviewed: 07/03/2013 [facility] Patient Information 2015  [place], [place]. This information is not intended to replace advice given to you by your health care provider. Make sure you discuss any questions you have with your health care provider. Hypoglycemia Hypoglycemia occurs when the glucose in your blood is too low. Glucose is a type of sugar that is your body's main energy source. Hormones, such as insulin and glucagon, control the level of glucose in the blood. Insulin lowers blood glucose and glucagon increases blood glucose. Having too much insulin in your blood stream, or not eating enough food containing sugar, can result in hypoglycemia. Hypoglycemia can happen to people with or without diabetes. It can develop quickly and can be a medical emergency.  CAUSES   Missing or delaying meals.  Not eating enough carbohydrates at meals.  Taking too much diabetes medicine.  Not timing your oral diabetes medicine or insulin doses with meals, snacks, and exercise.  Nausea and vomiting.  Certain medicines.  Severe illnesses, such as hepatitis, kidney disorders, and certain eating disorders.  Increased activity or exercise without eating something extra or adjusting medicines.  Drinking too much alcohol.  A nerve disorder that affects body functions like your heart rate, blood pressure, and digestion (autonomic neuropathy).  A condition where the stomach muscles do not function properly (gastroparesis). Therefore, medicines and food may not absorb properly.  Rarely, a tumor of the pancreas can produce too much insulin. SYMPTOMS   Hunger.  Sweating (diaphoresis).  Change in body temperature.  Shakiness.  Headache.  Anxiety.  Lightheadedness.  Irritability.  Difficulty concentrating.  Dry mouth.  Tingling or numbness in the hands or feet.  Restless sleep or sleep disturbances.  Altered speech and coordination.  Change in mental status.  Seizures or prolonged convulsions.  Combativeness.  Drowsiness  (lethargic).  Weakness.  Increased heart rate or palpitations.  Confusion.  Pale, gray skin color.  Blurred or double vision.  Fainting. DIAGNOSIS  A physical exam and medical history will be performed. Your caregiver may make a  diagnosis based on your symptoms. Blood tests and other lab tests may be performed to confirm a diagnosis. Once the diagnosis is made, your caregiver will see if your signs and symptoms go away once your blood glucose is raised.  TREATMENT  Usually, you can easily treat your hypoglycemia when you notice symptoms.  Check your blood glucose. If it is less than 70 mg/dl, take one of the following:   3-4 glucose tablets.    cup juice.    cup regular soda.   1 cup skim milk.   -1 tube of glucose gel.   5-6 hard candies.   Avoid high-fat drinks or food that may delay a rise in blood glucose levels.  Do not take more than the recommended amount of sugary foods, drinks, gel, or tablets. Doing so will cause your blood glucose to go too high.   Wait 10-15 minutes and recheck your blood glucose. If it is still less than 70 mg/dl or below your target range, repeat treatment.   Eat a snack if it is more than 1 hour until your next meal.  There may be a time when your blood glucose may go so low that you are unable to treat yourself at home when you start to notice symptoms. You may need someone to help you. You may even faint or be unable to swallow. If you cannot treat yourself, someone will need to bring you to the hospital.  [place]  If you have diabetes, follow your diabetes management plan by:  Taking your medicines as directed.  Following your exercise plan.  Following your meal plan. Do not skip meals. Eat on time.  Testing your blood glucose regularly. Check your blood glucose before and after exercise. If you exercise longer or different than usual, be sure to check blood glucose more frequently.  Wearing your  medical alert jewelry that says you have diabetes.  Identify the cause of your hypoglycemia. Then, develop ways to prevent the recurrence of hypoglycemia.  Do not take a hot bath or shower right after an insulin shot.  Always carry treatment with you. Glucose tablets are the easiest to carry.  If you are going to drink alcohol, drink it only with meals.  Tell friends or family members ways to keep you safe during a seizure. This may include removing hard or sharp objects from the area or turning you on your side.  Maintain a healthy weight. SEEK MEDICAL CARE IF:   You are having problems keeping your blood glucose in your target range.  You are having frequent episodes of hypoglycemia.  You feel you might be having side effects from your medicines.  You are not sure why your blood glucose is dropping so low.  You notice a change in vision or a new problem with your vision. SEEK IMMEDIATE MEDICAL CARE IF:   Confusion develops.  A change in mental status occurs.  The inability to swallow develops.  Fainting occurs. Document Released: 08/06/2005 Document Revised: 08/11/2013 Document Reviewed: 12/03/2011 [facility] Patient Information 2015 [place], [place]. This information is not intended to replace advice given to you by your health care provider. Make sure you discuss any questions you have with your health care provider.

## 2014-12-16 NOTE — Progress Notes (Signed)
Subjective:  [name], female    DOB: 01-08-1951  Age: 64 y.o. MRN: [id number]  CC: Diabetes; Hypothyroidism; and Hyperlipidemia   HPI [name] presents for  follow-up of hypertension. Patient has no history of headache chest pain or shortness of breath or recent cough. Patient also denies symptoms of TIA such as numbness weakness lateralizing. Patient checks  blood pressure at home and has not had any elevated readings recently. Patient denies side effects from his medication. States taking it regularly.  Patient also  in for follow-up of elevated cholesterol. Doing well without complaints on current medication. Denies side effects of statin including myalgia and arthralgia and nausea. Also in today for liver function testing. Currently no chest pain, shortness of breath or other cardiovascular related symptoms noted.  Follow-up of diabetes. Patient does check blood sugar at home. Readings run between 125 and 180 Patient denies symptoms such as polyuria, polydipsia, excessive hunger, nausea No significant hypoglycemic spells noted. Medications as noted below. Taking them regularly without complication/adverse reaction being reported today.  Patient presents for follow-up on  thyroid. She has a history of hypothyroidism for many years. It has been stable recently. Pt. denies any change in  voice, loss of hair, heat or cold intolerance. Energy level has been adequate to good. She denies constipation and diarrhea. No myxedema. Medication is as noted below. Verified that pt is taking it daily on an empty stomach. Well tolerated.  History Morgan Schmidt has a past medical history of Diabetes mellitus; Thyroid disease; Arthritis; and Cancer.   She has past surgical history that includes Thyroidectomy (2009) and Cesarean section.   Her family history is negative for Colon cancer, Esophageal cancer, Rectal cancer, and Stomach cancer.She reports that she quit smoking about 14  years ago. She has never used smokeless tobacco. She reports that she does not drink alcohol or use illicit drugs.  Current Outpatient Prescriptions on File Prior to Visit  Medication Sig Dispense Refill  . Glucose Blood (BLOOD GLUCOSE TEST STRIPS) STRP 1 Units by Other route 2 (two) times daily. 200 each 3  . levothyroxine (SYNTHROID, LEVOTHROID) 112 MCG tablet Take qod and rotate with levothroxine 142mg po qod 45 tablet 3  . levothyroxine (SYNTHROID, LEVOTHROID) 125 MCG tablet Take 1 tablet (125 mcg total) by mouth every other day. 45 tablet 3  . lisinopril (PRINIVIL,ZESTRIL) 5 MG tablet Take 1 tablet (5 mg total) by mouth daily. 90 tablet 3  . metFORMIN (GLUCOPHAGE) 1000 MG tablet TAKE 1 TABLET BY MOUTH 2 TIMES DAILY WITH A MEAL. 60 tablet 1  . NOVOFINE 30G X 8 MM MISC INJECT INTO THE SKIN AS DIRECTED AS NEEDED 100 each 1  . sitaGLIPtin (JANUVIA) 100 MG tablet TAKE 1 TABLET (100 MG TOTAL) BY MOUTH DAILY. 90 tablet 0   No current facility-administered medications on file prior to visit.    ROS Review of Systems  Constitutional: Negative for fever, chills, diaphoresis, appetite change, fatigue and unexpected weight change.  HENT: Negative for congestion, ear pain, hearing loss, postnasal drip, rhinorrhea, sneezing, sore throat and trouble swallowing.   Eyes: Negative for pain.  Respiratory: Negative for cough, chest tightness and shortness of breath.   Cardiovascular: Negative for chest pain and palpitations.  Gastrointestinal: Negative for nausea, vomiting, abdominal pain, diarrhea and constipation.  Genitourinary: Negative for dysuria, frequency and menstrual problem.  Musculoskeletal: Negative for joint swelling and arthralgias.  Skin: Negative for rash.  Neurological: Negative for dizziness, weakness, numbness and headaches.  Psychiatric/Behavioral: Negative for dysphoric mood and agitation.    Objective:  BP 126/75 mmHg  Pulse 75  Temp(Src) 97.7 F (36.5 C) (Oral)  Ht _0   (1.473 m)  Wt 167 lb 3.2 oz (75.841 kg)  BMI 34.95 kg/m2  BP Readings from Last 3 Encounters:  12/16/14 126/75  11/11/14 132/80  11/02/14 145/72    Wt Readings from Last 3 Encounters:  12/16/14 167 lb 3.2 oz (75.841 kg)  11/11/14 164 lb 6.4 oz (74.571 kg)  11/02/14 162 lb 3.2 oz (73.573 kg)     Physical Exam  Constitutional: She is oriented to person, place, and time. She appears well-developed and well-nourished. No distress.  HENT:  Head: Normocephalic and atraumatic.  Right Ear: External ear normal.  Left Ear: External ear normal.  Nose: Nose normal.  Mouth/Throat: Oropharynx is clear and moist.  Eyes: Conjunctivae and EOM are normal. Pupils are equal, round, and reactive to light.  Neck: Normal range of motion. Neck supple. No thyromegaly present.  Cardiovascular: Normal rate, regular rhythm and normal heart sounds.   No murmur heard. Pulmonary/Chest: Effort normal and breath sounds normal. No respiratory distress. She has no wheezes. She has no rales.  Abdominal: Soft. Bowel sounds are normal. She exhibits no distension. There is no tenderness.  Lymphadenopathy:    She has no cervical adenopathy.  Neurological: She is alert and oriented to person, place, and time. She has normal reflexes.  Skin: Skin is warm and dry.  Psychiatric: She has a normal mood and affect. Her behavior is normal. Judgment and thought content normal.    Lab Results  Component Value Date   HGBA1C 9.0 12/16/2014   HGBA1C 7.9 09/15/2014   HGBA1C 7.4 05/06/2014    Lab Results  Component Value Date   WBC 6.1 12/16/2014   HGB 13.3 12/16/2014   HCT 42.5 12/16/2014   PLT 191 06/30/2008   GLUCOSE 138* 09/15/2014   CHOL 199 05/06/2014   TRIG 309* 05/06/2014   HDL 34* 05/06/2014   LDLCALC 103* 05/06/2014   ALT 27 09/15/2014   AST 23 09/15/2014   NA 137 09/15/2014   K 4.0 09/15/2014   CL 97 09/15/2014   CREATININE 0.63 09/15/2014   BUN 11 09/15/2014   CO2 22 09/15/2014   TSH 0.801  09/15/2014   HGBA1C 9.0 12/16/2014    Mm Digital Screening Bilateral  03/18/2014   CLINICAL DATA:  Screening.  EXAM: DIGITAL SCREENING BILATERAL MAMMOGRAM WITH CAD  COMPARISON:  Previous exam(s).  ACR Breast Density Category b: There are scattered areas of fibroglandular density.  FINDINGS: There are no findings suspicious for malignancy. Images were processed with CAD.  IMPRESSION: No mammographic evidence of malignancy. A result letter of this screening mammogram will be mailed directly to the patient.  RECOMMENDATION: Screening mammogram in one year. (Code:[id number]-B-01Y)  BI-RADS CATEGORY  1: Negative.   Electronically Signed   By: Lovey Newcomer M.D.   On: 03/18/2014 11:44    Assessment & Plan:   Barbarann was seen today for diabetes, hypothyroidism and hyperlipidemia.  Diagnoses and all orders for this visit:  Mixed diabetic hyperlipidemia associated with type 2 diabetes mellitus Orders: -     POCT CBC; Standing -     POCT glycosylated hemoglobin (Hb A1C); Standing -     CMP14+EGFR -     Lipid panel; Standing -     NMR, lipoprofile; Standing -     Thyroid Panel With TSH; Standing -     Vit D  25 hydroxy (rtn osteoporosis monitoring); Standing -     POCT CBC -     POCT glycosylated hemoglobin (Hb A1C) -     Lipid panel -     Vit D  25 hydroxy (rtn osteoporosis monitoring) -     HM COLONOSCOPY -     Insulin Detemir (LEVEMIR) 100 UNIT/ML Pen; Inject 90 Units into the skin 1 day or 1 dose.  Other fatigue Orders: -     POCT CBC; Standing -     CMP14+EGFR -     Thyroid Panel With TSH; Standing -     Vit D  25 hydroxy (rtn osteoporosis monitoring); Standing -     POCT CBC -     Vit D  25 hydroxy (rtn osteoporosis monitoring)  Other specified hypothyroidism Orders: -     POCT CBC; Standing -     POCT glycosylated hemoglobin (Hb A1C); Standing -     CMP14+EGFR -     Lipid panel; Standing -     NMR, lipoprofile; Standing -     Thyroid Panel With TSH; Standing -     Vit D  25 hydroxy  (rtn osteoporosis monitoring); Standing -     POCT CBC -     POCT glycosylated hemoglobin (Hb A1C) -     Lipid panel -     Vit D  25 hydroxy (rtn osteoporosis monitoring)  Long term current use of insulin Orders: -     POCT CBC; Standing -     POCT glycosylated hemoglobin (Hb A1C); Standing -     CMP14+EGFR -     Lipid panel; Standing -     NMR, lipoprofile; Standing -     Thyroid Panel With TSH; Standing -     Vit D  25 hydroxy (rtn osteoporosis monitoring); Standing -     POCT CBC -     POCT glycosylated hemoglobin (Hb A1C) -     Lipid panel -     Vit D  25 hydroxy (rtn osteoporosis monitoring)  Polyposis of colon Orders: -     HM COLONOSCOPY   I have discontinued Ms. Guzek's amoxicillin-clavulanate. I have also changed her Insulin Detemir. Additionally, I am having her maintain her NOVOFINE, lisinopril, levothyroxine, levothyroxine, BLOOD GLUCOSE TEST STRIPS, metFORMIN, and sitaGLIPtin.  Meds ordered this encounter  Medications  . Insulin Detemir (LEVEMIR) 100 UNIT/ML Pen    Sig: Inject 90 Units into the skin 1 day or 1 dose.    Dispense:  5 pen    Refill:  11   handouts given for diabetes and foot care diabetes carb counting and hypoglycemia. Follow-up: Return in about 3 months (around 03/17/2015).  Claretta Fraise, M.D.

## 2014-12-17 ENCOUNTER — Other Ambulatory Visit: Payer: Self-pay | Admitting: Family Medicine

## 2014-12-17 LAB — LIPID PANEL
CHOL/HDL RATIO: 6.5 ratio — AB (ref 0.0–4.4)
Cholesterol, Total: 201 mg/dL — ABNORMAL HIGH (ref 100–199)
HDL: 31 mg/dL — ABNORMAL LOW (ref >39)
Triglycerides: 486 mg/dL — ABNORMAL HIGH (ref 0–149)

## 2014-12-17 LAB — CMP14+EGFR
ALT: 36 IU/L — ABNORMAL HIGH (ref 0–32)
AST: 50 IU/L — ABNORMAL HIGH (ref 0–40)
Albumin/Globulin Ratio: 2.6 — ABNORMAL HIGH (ref 1.1–2.5)
Albumin: 4.6 g/dL (ref 3.6–4.8)
Alkaline Phosphatase: 77 IU/L (ref 39–117)
BUN/Creatinine Ratio: 12 (ref 11–26)
BUN: 7 mg/dL — ABNORMAL LOW (ref 8–27)
Bilirubin Total: 0.5 mg/dL (ref 0.0–1.2)
CO2: 21 mmol/L (ref 18–29)
Calcium: 8.3 mg/dL — ABNORMAL LOW (ref 8.7–10.3)
Chloride: 98 mmol/L (ref 97–108)
Creatinine, Ser: 0.57 mg/dL (ref 0.57–1.00)
GFR calc non Af Amer: 100 mL/min/{1.73_m2} (ref >59)
GFR, EST AFRICAN AMERICAN: 116 mL/min/{1.73_m2} (ref >59)
GLOBULIN, TOTAL: 1.8 g/dL (ref 1.5–4.5)
Glucose: 199 mg/dL — ABNORMAL HIGH (ref 65–99)
Potassium: 3.8 mmol/L (ref 3.5–5.2)
Sodium: 137 mmol/L (ref 134–144)
Total Protein: 6.4 g/dL (ref 6.0–8.5)

## 2014-12-17 LAB — IRON AND TIBC
Iron Saturation: 23 % (ref 15–55)
Iron: 60 ug/dL (ref 27–139)
Total Iron Binding Capacity: 265 ug/dL (ref 250–450)
UIBC: 205 ug/dL (ref 118–369)

## 2014-12-17 LAB — SPECIMEN STATUS REPORT

## 2014-12-17 LAB — VITAMIN D 25 HYDROXY (VIT D DEFICIENCY, FRACTURES): Vit D, 25-Hydroxy: 16.4 ng/mL — ABNORMAL LOW (ref 30.0–100.0)

## 2014-12-17 LAB — FERRITIN: Ferritin: 186 ng/mL — ABNORMAL HIGH (ref 15–150)

## 2014-12-17 MED ORDER — CHOLINE FENOFIBRATE 135 MG PO CPDR: 135.0000 mg | DELAYED_RELEASE_CAPSULE | Freq: Every day | ORAL | Status: DC

## 2014-12-17 MED ORDER — VITAMIN D (ERGOCALCIFEROL) 1.25 MG (50000 UNIT) PO CAPS: 50000.0000 [IU] | ORAL_CAPSULE | ORAL | Status: DC

## 2015-01-03 ENCOUNTER — Other Ambulatory Visit: Payer: Self-pay

## 2015-01-03 VITALS — BP 138/76 | HR 76 | Ht 59.0 in | Wt 166.2 lb

## 2015-01-03 DIAGNOSIS — E119 Type 2 diabetes mellitus without complications: Secondary | ICD-10-CM

## 2015-01-03 NOTE — Patient Instructions (Signed)
Check blood sugars 1 time a day before meals or 1 -2 hrs after meals.  Goals of 80-130 before meals and less than 180 after meals.  Don't miss meals Plan to walk 3 times a week for 10 laps around the track for 45-60 minutes   Try to work up to 150 minutes a week. Try using exercise videos on rainy days  Keep appt with Dr. Livia Snellen on 03/18/15 Return to [facility] on 04/04/18 at 8:30AM at [place] office

## 2015-01-03 NOTE — Patient Outreach (Signed)
[facility]) Care Management   01/03/2015  Morgan Schmidt 01/14/1951 [id number]  Morgan Schmidt is an 64 y.o. female.   Member seen for follow up office visit for [facility] program for self management of Type 2 diabetes  Subjective: Member states that her hemoglobin A1C was up to 9 when she saw Dr.Stacks last month.  States that he increased her insulin.  States that she had several months of getting over a upper respiratory infection and a round of steroids that made her sugars go up.  States that she is now back to walking 3 days a week.  States that her blood sugars have improved since she increased her insulin.  Objective:   Review of Systems  All other systems reviewed and are negative. Member did not bring meter to visit for review  Physical Exam  Filed Vitals:   01/03/15 0830  BP: 138/76  Pulse: 76   Filed Weights   01/03/15 0830  Weight: 166 lb 3.2 oz (75.388 kg)    Current Medications:   Current Outpatient Prescriptions  Medication Sig Dispense Refill  . aspirin EC 81 MG tablet Take 81 mg by mouth daily.    . Choline Fenofibrate 135 MG capsule Take 1 capsule (135 mg total) by mouth daily. 30 capsule 5  . Glucose Blood (BLOOD GLUCOSE TEST STRIPS) STRP 1 Units by Other route 2 (two) times daily. 200 each 3  . Insulin Detemir (LEVEMIR) 100 UNIT/ML Pen Inject 90 Units into the skin 1 day or 1 dose. 5 pen 11  . levothyroxine (SYNTHROID, LEVOTHROID) 112 MCG tablet Take qod and rotate with levothroxine 12mcg po qod 45 tablet 3  . levothyroxine (SYNTHROID, LEVOTHROID) 125 MCG tablet Take 1 tablet (125 mcg total) by mouth every other day. 45 tablet 3  . lisinopril (PRINIVIL,ZESTRIL) 5 MG tablet Take 1 tablet (5 mg total) by mouth daily. 90 tablet 3  . metFORMIN (GLUCOPHAGE) 1000 MG tablet TAKE 1 TABLET BY MOUTH 2 TIMES DAILY WITH A MEAL. 60 tablet 1  . NOVOFINE 30G X 8 MM MISC INJECT INTO THE SKIN AS DIRECTED AS NEEDED 100 each 1  . sitaGLIPtin  (JANUVIA) 100 MG tablet TAKE 1 TABLET (100 MG TOTAL) BY MOUTH DAILY. 90 tablet 0  . Vitamin D, Ergocalciferol, (DRISDOL) 50000 UNITS CAPS capsule Take 1 capsule (50,000 Units total) by mouth every 7 (seven) days. 30 capsule 1   No current facility-administered medications for this visit.    Functional Status:   In your present state of health, do you have any difficulty performing the following activities: 01/03/2015  Hearing? N  Vision? N  Difficulty concentrating or making decisions? N  Walking or climbing stairs? N  Dressing or bathing? N  Doing errands, shopping? N    Fall/Depression Screening:    PHQ 2/9 Scores 01/03/2015 12/16/2014 11/11/2014 09/15/2014 11/13/2013  PHQ - 2 Score 0 0 0 0 0   [facility] CM Care Plan Problem One        Patient Outreach from 01/03/2015 in [place] Problem One  Elevated blood sugars as evidenced by HgbA1C of 7.9   Care Plan for Problem One  Active   [facility] Long Term Goal (31-90 days)  Member will decrease HgbA1C to  7.0 or below in the next 90 days   [facility] Long Term Goal Start Date  11/11/14   Interventions for Problem One Long Term Goal  Reviewed carbohydrate counting and portion control, Reinforced  importance of exercise to lower blood sugars, Discussed that she might need an adjustment in her medications if her hemoglobin A1C does not improve by her next MD visit      Assessment:   Member seen for follow up office visit for [facility] program for self management of Type 2 diabetes.  Member is not meeting goal for her hemoglobin A1C with last result 9.  Member had been on a round of steroids and reported higher blood sugars.  She reports she is back to walking 3 days a week and she is adherent with diet and medications.  Plan:   Plan to check blood sugars 1 time a day before meals or 1 -2 hrs after meals.  Goals of 80-130 before meals and less than 180 after meals.  Don't miss meals Plan to walk 3 times a week for 10 laps around  the track for 45-60 minutes   Try to work up to 150 minutes a week. Try using exercise videos on rainy days  Plan to keep appt with Dr. Livia Snellen on 03/18/15 Return to [facility] on 04/04/18 at 8:30AM at [place] office  Peter Garter RN, [facility] Care Management Coordinator-Link to [place] Management [phone]

## 2015-02-18 ENCOUNTER — Other Ambulatory Visit: Payer: Self-pay | Admitting: Family Medicine

## 2015-02-22 NOTE — Telephone Encounter (Signed)
Last seen 12/16/14 Dr Livia Snellen   Requesting a 90 day supply

## 2015-03-17 NOTE — Progress Notes (Signed)
Subjective:  [name], female    DOB: 1950/11/07  Age: 64 y.o. MRN: [id number]  CC: Diabetes; Hyperlipidemia; and Hypertension   HPI Morgan Schmidt presents forFollow-up of diabetes. Patient does check blood sugar at home. Fasting under 110. PP is 150-170 usually. Patient denies symptoms such as polyuria, polydipsia, excessive hunger, nausea No significant hypoglycemic spells noted. Medications as noted below. Taking them regularly without complication/adverse reaction being reported today.   Patient in for follow-up of elevated cholesterol. Doing well without complaints on current medication. Denies side effects of statin including myalgia and arthralgia and nausea. Also in today for liver function testing. Currently no chest pain, shortness of breath or other cardiovascular related symptoms noted.   follow-up of hypertension. Patient has no history of headache chest pain or shortness of breath or recent cough. Patient also denies symptoms of TIA such as numbness weakness lateralizing. Patient checks  blood pressure at home and has not had any elevated readings recently. Patient denies side effects from his medication. States taking it regularly.   History Morgan Schmidt has a past medical history of Diabetes mellitus; Thyroid disease; Arthritis; and Cancer.   She has past surgical history that includes Thyroidectomy (2009) and Cesarean section.   Her family history is negative for Colon cancer, Esophageal cancer, Rectal cancer, and Stomach cancer.She reports that she quit smoking about 14 years ago. She has never used smokeless tobacco. She reports that she does not drink alcohol or use illicit drugs.  Current Outpatient Prescriptions on File Prior to Visit  Medication Sig Dispense Refill  . aspirin EC 81 MG tablet Take 81 mg by mouth daily.    . Choline Fenofibrate 135 MG capsule Take 1 capsule (135 mg total) by mouth daily. 30 capsule 5  . JANUVIA 100 MG tablet TAKE 1  TABLET BY MOUTH DAILY. 90 tablet 1  . levothyroxine (SYNTHROID, LEVOTHROID) 112 MCG tablet Take qod and rotate with levothroxine 173mg po qod 45 tablet 3  . levothyroxine (SYNTHROID, LEVOTHROID) 125 MCG tablet Take 1 tablet (125 mcg total) by mouth every other day. 45 tablet 3  . lisinopril (PRINIVIL,ZESTRIL) 5 MG tablet Take 1 tablet (5 mg total) by mouth daily. 90 tablet 3   No current facility-administered medications on file prior to visit.    ROS Review of Systems  Constitutional: Negative for fever, chills, diaphoresis, appetite change, fatigue and unexpected weight change.  HENT: Negative for congestion, ear pain, hearing loss, postnasal drip, rhinorrhea, sneezing, sore throat and trouble swallowing.   Eyes: Negative for pain.  Respiratory: Negative for cough, chest tightness and shortness of breath.   Cardiovascular: Negative for chest pain and palpitations.  Gastrointestinal: Negative for nausea, vomiting, abdominal pain, diarrhea and constipation.  Genitourinary: Negative for dysuria, frequency and menstrual problem.  Musculoskeletal: Negative for joint swelling and arthralgias.  Skin: Negative for rash.  Neurological: Negative for dizziness, weakness, numbness and headaches.  Psychiatric/Behavioral: Negative for dysphoric mood and agitation.    Objective:  BP 121/70 mmHg  Pulse 72  Temp(Src) 97.6 F (36.4 C) (Oral)  Ht _0  (1.499 m)  Wt 163 lb 3.2 oz (74.027 kg)  BMI 32.94 kg/m2  BP Readings from Last 3 Encounters:  03/18/15 121/70  01/03/15 138/76  12/16/14 126/75    Wt Readings from Last 3 Encounters:  03/18/15 163 lb 3.2 oz (74.027 kg)  01/03/15 166 lb 3.2 oz (75.388 kg)  12/16/14 167 lb 3.2 oz (75.841 kg)     Physical Exam  Constitutional: She is oriented to person, place, and time. She appears well-developed and well-nourished. No distress.  HENT:  Head: Normocephalic and atraumatic.  Right Ear: External ear normal.  Left Ear: External ear normal.   Nose: Nose normal.  Mouth/Throat: Oropharynx is clear and moist.  Eyes: Conjunctivae and EOM are normal. Pupils are equal, round, and reactive to light.  Neck: Normal range of motion. Neck supple. No thyromegaly present.  Cardiovascular: Normal rate, regular rhythm and normal heart sounds.   No murmur heard. Pulmonary/Chest: Effort normal and breath sounds normal. No respiratory distress. She has no wheezes. She has no rales.  Abdominal: Soft. Bowel sounds are normal. She exhibits no distension. There is no tenderness.  Lymphadenopathy:    She has no cervical adenopathy.  Neurological: She is alert and oriented to person, place, and time. She has normal reflexes.  Skin: Skin is warm and dry.  Psychiatric: She has a normal mood and affect. Her behavior is normal. Judgment and thought content normal.    Lab Results  Component Value Date   HGBA1C 8.1 03/18/2015   HGBA1C 9.0 12/16/2014   HGBA1C 7.9 09/15/2014    Lab Results  Component Value Date   WBC 6.3 03/18/2015   HGB 13.3 03/18/2015   HCT 41.3 03/18/2015   PLT 191 06/30/2008   GLUCOSE 104* 03/18/2015   CHOL 201* 03/18/2015   TRIG 224* 03/18/2015   HDL 37* 03/18/2015   LDLCALC 119* 03/18/2015   ALT 49* 03/18/2015   AST 66* 03/18/2015   NA 140 03/18/2015   K 4.1 03/18/2015   CL 99 03/18/2015   CREATININE 0.72 03/18/2015   BUN 12 03/18/2015   CO2 21 03/18/2015   TSH 1.150 03/18/2015   HGBA1C 8.1 03/18/2015     Assessment & Plan:   Lydiann was seen today for diabetes, hyperlipidemia and hypertension.  Diagnoses and all orders for this visit:  Mixed diabetic hyperlipidemia associated with type 2 diabetes mellitus Orders: -     POCT CBC -     POCT glycosylated hemoglobin (Hb A1C) -     Lipid panel -     Insulin Detemir (LEVEMIR) 100 UNIT/ML Pen; Inject 100 Units into the skin 1 day or 1 dose. -     CMP14+EGFR  Other fatigue Orders: -     POCT CBC -     CMP14+EGFR  Other specified hypothyroidism Orders: -      POCT CBC -     POCT glycosylated hemoglobin (Hb A1C) -     Lipid panel -     TSH -     CMP14+EGFR  Long term current use of insulin Orders: -     POCT CBC -     POCT glycosylated hemoglobin (Hb A1C) -     Lipid panel -     CMP14+EGFR  Vitamin D deficiency Orders: -     CMP14+EGFR -     Vit D  25 hydroxy (rtn osteoporosis monitoring)  Other orders -     Cholecalciferol (VITAMIN D) 2000 UNITS tablet; Take 1 tablet (2,000 Units total) by mouth daily. -     Insulin Pen Needle (NOVOFINE) 30G X 8 MM MISC; Inject 10 each into the skin as needed. -     Glucose Blood (BLOOD GLUCOSE TEST STRIPS) STRP; 1 Units by Other route 2 (two) times daily. -     metFORMIN (GLUCOPHAGE) 1000 MG tablet; TAKE 1 TABLET BY MOUTH 2 TIMES DAILY WITH A MEAL.   I  have discontinued Ms. Onstad's Vitamin D (Ergocalciferol). I have changed her NOVOFINE to Insulin Pen Needle. I have also changed her Insulin Detemir. Additionally, I am having her start on Vitamin D. Lastly, I am having her maintain her lisinopril, levothyroxine, levothyroxine, Choline Fenofibrate, aspirin EC, JANUVIA, BLOOD GLUCOSE TEST STRIPS, and metFORMIN.  Meds ordered this encounter  Medications  . Cholecalciferol (VITAMIN D) 2000 UNITS tablet    Sig: Take 1 tablet (2,000 Units total) by mouth daily.    Dispense:  30 tablet    Refill:  11  . Insulin Detemir (LEVEMIR) 100 UNIT/ML Pen    Sig: Inject 100 Units into the skin 1 day or 1 dose.    Dispense:  5 pen    Refill:  11  . Insulin Pen Needle (NOVOFINE) 30G X 8 MM MISC    Sig: Inject 10 each into the skin as needed.    Dispense:  100 each    Refill:  3  . Glucose Blood (BLOOD GLUCOSE TEST STRIPS) STRP    Sig: 1 Units by Other route 2 (two) times daily.    Dispense:  200 each    Refill:  3    Test strips to fit True Result Monitor  . metFORMIN (GLUCOPHAGE) 1000 MG tablet    Sig: TAKE 1 TABLET BY MOUTH 2 TIMES DAILY WITH A MEAL.    Dispense:  180 tablet    Refill:  3      Follow-up: Return in about 3 months (around 06/18/2015) for diabetes.  Claretta Fraise, M.D.

## 2015-03-18 ENCOUNTER — Ambulatory Visit (INDEPENDENT_AMBULATORY_CARE_PROVIDER_SITE_OTHER): Payer: 59 | Admitting: Family Medicine

## 2015-03-18 ENCOUNTER — Encounter: Payer: Self-pay | Admitting: Family Medicine

## 2015-03-18 VITALS — BP 121/70 | HR 72 | Temp 97.6°F | Ht 59.0 in | Wt 163.2 lb

## 2015-03-18 DIAGNOSIS — E1169 Type 2 diabetes mellitus with other specified complication: Secondary | ICD-10-CM | POA: Diagnosis not present

## 2015-03-18 DIAGNOSIS — Z794 Long term (current) use of insulin: Secondary | ICD-10-CM

## 2015-03-18 DIAGNOSIS — E782 Mixed hyperlipidemia: Secondary | ICD-10-CM

## 2015-03-18 DIAGNOSIS — E559 Vitamin D deficiency, unspecified: Secondary | ICD-10-CM | POA: Diagnosis not present

## 2015-03-18 DIAGNOSIS — R5383 Other fatigue: Secondary | ICD-10-CM

## 2015-03-18 DIAGNOSIS — E038 Other specified hypothyroidism: Secondary | ICD-10-CM

## 2015-03-18 LAB — POCT CBC
Granulocyte percent: 55 %G (ref 37–80)
HCT, POC: 41.3 % (ref 37.7–47.9)
HEMOGLOBIN: 13.3 g/dL (ref 12.2–16.2)
Lymph, poc: 2.4 (ref 0.6–3.4)
MCH, POC: 25.9 pg — AB (ref 27–31.2)
MCHC: 32.2 g/dL (ref 31.8–35.4)
MCV: 80.6 fL (ref 80–97)
MPV: 8.4 fL (ref 0–99.8)
PLATELET COUNT, POC: 251 10*3/uL (ref 142–424)
POC Granulocyte: 3.5 (ref 2–6.9)
POC LYMPH %: 38.5 % (ref 10–50)
RBC: 5.13 M/uL (ref 4.04–5.48)
RDW, POC: 13.9 %
WBC: 6.3 10*3/uL (ref 4.6–10.2)

## 2015-03-18 LAB — POCT GLYCOSYLATED HEMOGLOBIN (HGB A1C): Hemoglobin A1C: 8.1

## 2015-03-18 MED ORDER — METFORMIN HCL 1000 MG PO TABS: ORAL_TABLET | ORAL | Status: DC

## 2015-03-18 MED ORDER — INSULIN PEN NEEDLE 30G X 8 MM MISC: 1.0000 | Status: DC | PRN

## 2015-03-18 MED ORDER — VITAMIN D 50 MCG (2000 UT) PO TABS: 2000.0000 [IU] | ORAL_TABLET | Freq: Every day | ORAL | Status: AC

## 2015-03-18 MED ORDER — INSULIN DETEMIR 100 UNIT/ML FLEXPEN: 100.0000 [IU] | PEN_INJECTOR | SUBCUTANEOUS | Status: DC

## 2015-03-18 MED ORDER — BLOOD GLUCOSE TEST VI STRP: 1.0000 [IU] | ORAL_STRIP | Freq: Two times a day (BID) | Status: DC

## 2015-03-18 NOTE — Patient Instructions (Signed)
Hypoglycemia °Hypoglycemia occurs when the glucose in your blood is too low. Glucose is a type of sugar that is your body's main energy source. Hormones, such as insulin and glucagon, control the level of glucose in the blood. Insulin lowers blood glucose and glucagon increases blood glucose. Having too much insulin in your blood stream, or not eating enough food containing sugar, can result in hypoglycemia. Hypoglycemia can happen to people with or without diabetes. It can develop quickly and can be a medical emergency.  °CAUSES  °· Missing or delaying meals. °· Not eating enough carbohydrates at meals. °· Taking too much diabetes medicine. °· Not timing your oral diabetes medicine or insulin doses with meals, snacks, and exercise. °· Nausea and vomiting. °· Certain medicines. °· Severe illnesses, such as hepatitis, kidney disorders, and certain eating disorders. °· Increased activity or exercise without eating something extra or adjusting medicines. °· Drinking too much alcohol. °· A nerve disorder that affects body functions like your heart rate, blood pressure, and digestion (autonomic neuropathy). °· A condition where the stomach muscles do not function properly (gastroparesis). Therefore, medicines and food may not absorb properly. °· Rarely, a tumor of the pancreas can produce too much insulin. °SYMPTOMS  °· Hunger. °· Sweating (diaphoresis). °· Change in body temperature. °· Shakiness. °· Headache. °· Anxiety. °· Lightheadedness. °· Irritability. °· Difficulty concentrating. °· Dry mouth. °· Tingling or numbness in the hands or feet. °· Restless sleep or sleep disturbances. °· Altered speech and coordination. °· Change in mental status. °· Seizures or prolonged convulsions. °· Combativeness. °· Drowsiness (lethargic). °· Weakness. °· Increased heart rate or palpitations. °· Confusion. °· Pale, gray skin color. °· Blurred or double vision. °· Fainting. °DIAGNOSIS  °A physical exam and medical history will be  performed. Your caregiver may make a diagnosis based on your symptoms. Blood tests and other lab tests may be performed to confirm a diagnosis. Once the diagnosis is made, your caregiver will see if your signs and symptoms go away once your blood glucose is raised.  °TREATMENT  °Usually, you can easily treat your hypoglycemia when you notice symptoms. °· Check your blood glucose. If it is less than 70 mg/dl, take one of the following:   °¨ 3-4 glucose tablets.   °¨ ½ cup juice.   °¨ ½ cup regular soda.   °¨ 1 cup skim milk.   °¨ ½-1 tube of glucose gel.   °¨ 5-6 hard candies.   °· Avoid high-fat drinks or food that may delay a rise in blood glucose levels. °· Do not take more than the recommended amount of sugary foods, drinks, gel, or tablets. Doing so will cause your blood glucose to go too high.   °· Wait 10-15 minutes and recheck your blood glucose. If it is still less than 70 mg/dl or below your target range, repeat treatment.   °· Eat a snack if it is more than 1 hour until your next meal.   °There may be a time when your blood glucose may go so low that you are unable to treat yourself at home when you start to notice symptoms. You may need someone to help you. You may even faint or be unable to swallow. If you cannot treat yourself, someone will need to bring you to the hospital.  °HOME CARE INSTRUCTIONS °· If you have diabetes, follow your diabetes management plan by: °¨ Taking your medicines as directed. °¨ Following your exercise plan. °¨ Following your meal plan. Do not skip meals. Eat on time. °¨ Testing your blood   glucose regularly. Check your blood glucose before and after exercise. If you exercise longer or different than usual, be sure to check blood glucose more frequently. °¨ Wearing your medical alert jewelry that says you have diabetes. °· Identify the cause of your hypoglycemia. Then, develop ways to prevent the recurrence of hypoglycemia. °· Do not take a hot bath or shower right after an  insulin shot. °· Always carry treatment with you. Glucose tablets are the easiest to carry. °· If you are going to drink alcohol, drink it only with meals. °· Tell friends or family members ways to keep you safe during a seizure. This may include removing hard or sharp objects from the area or turning you on your side. °· Maintain a healthy weight. °SEEK MEDICAL CARE IF:  °· You are having problems keeping your blood glucose in your target range. °· You are having frequent episodes of hypoglycemia. °· You feel you might be having side effects from your medicines. °· You are not sure why your blood glucose is dropping so low. °· You notice a change in vision or a new problem with your vision. °SEEK IMMEDIATE MEDICAL CARE IF:  °· Confusion develops. °· A change in mental status occurs. °· The inability to swallow develops. °· Fainting occurs. °Document Released: 08/06/2005 Document Revised: 08/11/2013 Document Reviewed: 12/03/2011 °Exit[employer]® Patient Information ©2015 [employer]. This information is not intended to replace advice given to you by your health care provider. Make sure you discuss any questions you have with your health care provider. ° °

## 2015-03-19 LAB — LIPID PANEL
CHOLESTEROL TOTAL: 201 mg/dL — AB (ref 100–199)
Chol/HDL Ratio: 5.4 ratio units — ABNORMAL HIGH (ref 0.0–4.4)
HDL: 37 mg/dL — AB (ref >39)
LDL Calculated: 119 mg/dL — ABNORMAL HIGH (ref 0–99)
TRIGLYCERIDES: 224 mg/dL — AB (ref 0–149)
VLDL CHOLESTEROL CAL: 45 mg/dL — AB (ref 5–40)

## 2015-03-19 LAB — CMP14+EGFR
ALT: 49 IU/L — ABNORMAL HIGH (ref 0–32)
AST: 66 IU/L — AB (ref 0–40)
Albumin/Globulin Ratio: 2.5 (ref 1.1–2.5)
Albumin: 4.7 g/dL (ref 3.6–4.8)
Alkaline Phosphatase: 52 IU/L (ref 39–117)
BUN/Creatinine Ratio: 17 (ref 11–26)
BUN: 12 mg/dL (ref 8–27)
Bilirubin Total: 0.5 mg/dL (ref 0.0–1.2)
CALCIUM: 8.9 mg/dL (ref 8.7–10.3)
CO2: 21 mmol/L (ref 18–29)
CREATININE: 0.72 mg/dL (ref 0.57–1.00)
Chloride: 99 mmol/L (ref 97–108)
GFR calc Af Amer: 104 mL/min/{1.73_m2} (ref >59)
GFR, EST NON AFRICAN AMERICAN: 90 mL/min/{1.73_m2} (ref >59)
GLUCOSE: 104 mg/dL — AB (ref 65–99)
Globulin, Total: 1.9 g/dL (ref 1.5–4.5)
Potassium: 4.1 mmol/L (ref 3.5–5.2)
Sodium: 140 mmol/L (ref 134–144)
TOTAL PROTEIN: 6.6 g/dL (ref 6.0–8.5)

## 2015-03-19 LAB — TSH: TSH: 1.15 u[IU]/mL (ref 0.450–4.500)

## 2015-03-19 LAB — VITAMIN D 25 HYDROXY (VIT D DEFICIENCY, FRACTURES): Vit D, 25-Hydroxy: 32.7 ng/mL (ref 30.0–100.0)

## 2015-04-04 ENCOUNTER — Other Ambulatory Visit: Payer: Self-pay

## 2015-04-04 VITALS — BP 138/70 | HR 74 | Resp 16 | Ht 59.0 in | Wt 169.0 lb

## 2015-04-04 DIAGNOSIS — E119 Type 2 diabetes mellitus without complications: Secondary | ICD-10-CM

## 2015-04-04 NOTE — Patient Outreach (Signed)
[facility]) Care Management   04/04/2015  Morgan Schmidt [birth year] [id number]  Morgan Schmidt is an 64 y.o. female.   Member seen for follow up office visit for Link to Wellness program for self management of Type 2 diabetes  Subjective: Member states she saw her MD on 03/18/15 and her hemoglobin A1C is up to 8.1.  States he increased her Levemir  to 100 units a day.  States that her blood sugars have been better fasting but the few times she has checked after meals they were still up around 170.  States she is trying to follow a low CHO diet and is walking 3-4 times a week.  States she has not been missing taking her insulin since she started taking it to work with her.  Objective:   Review of Systems  All other systems reviewed and are negative. Reviewed glucometer 7 day average-115 14 day average-154  30 day average-124  Physical Exam  Today's Vitals   04/04/15 0841  BP: 138/70  Pulse: 74  Resp: 16  Height: 1.499 m (4\' 11" )  Weight: 169 lb (76.658 kg)  SpO2: 97%  PainSc: 0-No pain   Current Medications:   Current Outpatient Prescriptions  Medication Sig Dispense Refill  . aspirin EC 81 MG tablet Take 81 mg by mouth daily.    . Cholecalciferol (VITAMIN D) 2000 UNITS tablet Take 1 tablet (2,000 Units total) by mouth daily. 30 tablet 11  . Choline Fenofibrate 135 MG capsule Take 1 capsule (135 mg total) by mouth daily. 30 capsule 5  . Glucose Blood (BLOOD GLUCOSE TEST STRIPS) STRP 1 Units by Other route 2 (two) times daily. 200 each 3  . Insulin Detemir (LEVEMIR) 100 UNIT/ML Pen Inject 100 Units into the skin 1 day or 1 dose. 5 pen 11  . Insulin Pen Needle (NOVOFINE) 30G X 8 MM MISC Inject 10 each into the skin as needed. 100 each 3  . JANUVIA 100 MG tablet TAKE 1 TABLET BY MOUTH DAILY. 90 tablet 1  . levothyroxine (SYNTHROID, LEVOTHROID) 112 MCG tablet Take qod and rotate with levothroxine 194mcg po qod 45 tablet 3  . levothyroxine (SYNTHROID,  LEVOTHROID) 125 MCG tablet Take 1 tablet (125 mcg total) by mouth every other day. 45 tablet 3  . lisinopril (PRINIVIL,ZESTRIL) 5 MG tablet Take 1 tablet (5 mg total) by mouth daily. 90 tablet 3  . metFORMIN (GLUCOPHAGE) 1000 MG tablet TAKE 1 TABLET BY MOUTH 2 TIMES DAILY WITH A MEAL. 180 tablet 3   No current facility-administered medications for this visit.    Functional Status:   In your present state of health, do you have any difficulty performing the following activities: 04/04/2015 01/03/2015  Hearing? N N  Vision? N N  Difficulty concentrating or making decisions? N N  Walking or climbing stairs? N N  Dressing or bathing? N N  Doing errands, shopping? N N    Fall/Depression Screening:    PHQ 2/9 Scores 04/04/2015 01/03/2015 12/16/2014 11/11/2014 09/15/2014 11/13/2013  PHQ - 2 Score 0 0 0 0 0 0   THN CM Care Plan Problem One        Patient Outreach from 04/04/2015 in [place] Problem One  Elevated blood sugars as evidenced by HgbA1C of 8.1   Care Plan for Problem One  Active   THN Long Term Goal (31-90 days)  Member will decrease HgbA1C to  7.0 or below in the next 90 days  THN Long Term Goal Start Date  04/04/15 [Continues to have elevated hemoglobin A1C]   Interventions for Problem One Long Term Goal  Reviewed carbohydrate counting and portion control, Reinforced importance of exercise to lower blood sugars, Encouraged to discuss with MD at next visit about referral to endocrinologist if her hemoglobin A1C is not improved after increase in her insulin      Assessment:   Member seen for follow up office visit for Link to Wellness program for self management of Type 2 diabetes.  Member continues to not be meeting hemoglobin A1C of 7.  She reports following diet and exercise recommendations.  She reports improved adherence with medications.  Member may benefit from referral to endocrinologist if her hemoglobin A1C does not improve  Plan:  1. Check blood  sugars 1 time a day before meals or 1 -2 hrs after meals.  Goals of 80-130 before meals and less than 180 after meals.  Don't miss meals 2. Plan to walk 3 times a week for 8 laps around the track for 45-60 minutes   Try to work up to 150 minutes a week. Plan to use Fit Board on rainy days  3. Plan to take medications daily as ordered 4. Return to Link to Wellness on 05/30/15    Peter Garter RN, [facility] Care Management Coordinator-Link to [place] Management [phone]

## 2015-04-04 NOTE — Patient Instructions (Addendum)
Check blood sugars 1 time a day before meals or 1 -2 hrs after meals.  Goals of 80-130 before meals and less than 180 after meals.  Don't miss meals Plan to walk 3 times a week for 8 laps around the track for 45-60 minutes   Try to work up to 150 minutes a week. Plan to use Fit Board on rainy days  Plan to take medications daily as ordered Return to Link to Wellness on 05/30/15

## 2015-05-23 ENCOUNTER — Other Ambulatory Visit: Payer: Self-pay | Admitting: Family Medicine

## 2015-05-24 ENCOUNTER — Other Ambulatory Visit: Payer: Self-pay

## 2015-05-24 DIAGNOSIS — E039 Hypothyroidism, unspecified: Secondary | ICD-10-CM

## 2015-05-24 DIAGNOSIS — I159 Secondary hypertension, unspecified: Secondary | ICD-10-CM

## 2015-05-24 DIAGNOSIS — I10 Essential (primary) hypertension: Secondary | ICD-10-CM

## 2015-05-24 MED ORDER — LEVOTHYROXINE SODIUM 112 MCG PO TABS: ORAL_TABLET | ORAL | Status: DC

## 2015-05-24 MED ORDER — LEVOTHYROXINE SODIUM 125 MCG PO TABS: 125.0000 ug | ORAL_TABLET | ORAL | Status: DC

## 2015-05-24 MED ORDER — LISINOPRIL 5 MG PO TABS: 5.0000 mg | ORAL_TABLET | Freq: Every day | ORAL | Status: DC

## 2015-05-25 NOTE — Telephone Encounter (Signed)
Patient aware that Rx has been sent to the pharmacy 

## 2015-05-27 ENCOUNTER — Other Ambulatory Visit: Payer: Self-pay

## 2015-05-27 DIAGNOSIS — Z1231 Encounter for screening mammogram for malignant neoplasm of breast: Secondary | ICD-10-CM

## 2015-05-30 ENCOUNTER — Other Ambulatory Visit: Payer: Self-pay

## 2015-05-30 VITALS — BP 142/78 | HR 70 | Resp 14 | Ht 59.0 in | Wt 166.8 lb

## 2015-05-30 DIAGNOSIS — E119 Type 2 diabetes mellitus without complications: Secondary | ICD-10-CM

## 2015-05-30 DIAGNOSIS — Z794 Long term (current) use of insulin: Principal | ICD-10-CM

## 2015-05-30 NOTE — Patient Instructions (Signed)
1. Check blood sugars 1 time a day before meals or 1 -2 hrs after meals.  Goals of 80-130 before meals and less than 180 after meals.  2. Plan to walk 3 times a week for 8 laps around the track for 45-60 minutes   Try to work up to 150 minutes a week. Plan to use Fit Board on rainy days twice a day for 10 minutes 3. Plan to take medications daily as ordered 4. Plan to see Dr. Livia Snellen on 06/21/15 5. Return to Link to Wellness on 08/29/15 at 8:30AM at [place] office

## 2015-05-30 NOTE — Patient Outreach (Signed)
[facility]) Care Management   05/30/2015  MISHAAL LANSDALE [birth year] [id number]  Morgan Schmidt is an 64 y.o. female.   Member seen for follow up office visit for Link to Wellness program for self management of Type 2 diabetes  Subjective: Member states that her blood sugars have improved since her insulin was increased.  States that she is to see MD on 11/1 to have her hemoglobin A1C rechecked.  States she is taking her insulin daily now that she is taking it to work with her.  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  Objective:   Review of Systems  All other systems reviewed and are negative. Reviewed glucometer 7 day average-118 14 day average-115 30 day average-114  Physical Exam  Today's Vitals   05/30/15 0829  BP: 142/78  Pulse: 70  Resp: 14  Height: 1.499 m (4\' 11" )  Weight: 166 lb 12.8 oz (75.66 kg)  SpO2: 97%  PainSc: 0-No pain   Current Medications:   Current Outpatient Prescriptions  Medication Sig Dispense Refill  . aspirin EC 81 MG tablet Take 81 mg by mouth daily.    . Cholecalciferol (VITAMIN D) 2000 UNITS tablet Take 1 tablet (2,000 Units total) by mouth daily. 30 tablet 11  . Choline Fenofibrate 135 MG capsule Take 1 capsule (135 mg total) by mouth daily. 30 capsule 5  . Glucose Blood (BLOOD GLUCOSE TEST STRIPS) STRP 1 Units by Other route 2 (two) times daily. 200 each 3  . Insulin Detemir (LEVEMIR) 100 UNIT/ML Pen Inject 100 Units into the skin 1 day or 1 dose. 5 pen 11  . Insulin Pen Needle (NOVOFINE) 30G X 8 MM MISC Inject 10 each into the skin as needed. 100 each 3  . JANUVIA 100 MG tablet TAKE 1 TABLET BY MOUTH DAILY. 90 tablet 1  . levothyroxine (SYNTHROID, LEVOTHROID) 112 MCG tablet Take qod and rotate with levothroxine 179mcg po qod 45 tablet 2  . levothyroxine (SYNTHROID, LEVOTHROID) 125 MCG tablet Take 1 tablet (125 mcg total) by mouth every other day. 45 tablet 2  .  lisinopril (PRINIVIL,ZESTRIL) 5 MG tablet Take 1 tablet (5 mg total) by mouth daily. 90 tablet 0  . metFORMIN (GLUCOPHAGE) 1000 MG tablet TAKE 1 TABLET BY MOUTH 2 TIMES DAILY WITH A MEAL. 180 tablet 3   No current facility-administered medications for this visit.    Functional Status:   In your present state of health, do you have any difficulty performing the following activities: 04/04/2015 01/03/2015  Hearing? N N  Vision? N N  Difficulty concentrating or making decisions? N N  Walking or climbing stairs? N N  Dressing or bathing? N N  Doing errands, shopping? N N    Fall/Depression Screening:    PHQ 2/9 Scores 05/30/2015 04/04/2015 01/03/2015 12/16/2014 11/11/2014 09/15/2014 11/13/2013  PHQ - 2 Score 0 0 0 0 0 0 0     Assessment:   Member seen for follow up office visit for Link to Wellness program for self management of Type 2 diabetes. Member not meeting self management goal of hemoglobin A1C of 7 or below with last reading 8.1.  Hemoglobin A1C to be rechecked on 06/21/15 MD visit.  Member has improved CBG readings with ranges of 7-137 fasting.  Member reports walking 3 times a week and doing exercises at home 2-3 times a week.  Member had one episode of hypoglycemia in  the morning.   Plan:  Check blood sugars 1 time a day before meals or 1 -2 hrs after meals.  Goals of 80-130 before meals and less than 180 after meals.  Plan to walk 3 times a week for 8 laps around the track for 45-60 minutes   Try to work up to 150 minutes a week. Plan to use Fit Board on rainy days twice a day for 10 minutes Plan to take medications daily as ordered Plan to see Dr.Stacks on 06/21/15 Return to Link to Wellness on 08/29/15 at 8:30AM at [place] office  [place] Problem One        Most Recent Value   Care Plan Problem One  Elevated blood sugars as evidenced by HgbA1C of 8.1   Role Documenting the Problem One  Care Management [place] for Problem One  Active   THN Long Term  Goal (31-90 days)  Member will decrease HgbA1C to  7.0 or below in the next 90 days   THN Long Term Goal Start Date  04/04/15 [Continues to have elevated hemoglobin A1C]   Interventions for Problem One Long Term Goal  Reviewed carbohydrate counting and portion control, Reinforced importance of exercise to lower blood sugars, Reviewed s/s of hypoglycemia and actions to take such as the rule of 15, Reinforced to discuss with MD at next visit about referral to endocrinologist if her hemoglobin A1C is not improved after increase in her insulin    Peter Garter RN, [facility] Care Management Coordinator-Link to [place] Management [phone]

## 2015-06-07 ENCOUNTER — Ambulatory Visit
Admission: RE | Admit: 2015-06-07 | Discharge: 2015-06-07 | Disposition: A | Discharge status: Home / Self Care | Payer: 59 | Source: Ambulatory Visit

## 2015-06-07 ENCOUNTER — Encounter: Payer: Self-pay | Admitting: Family Medicine

## 2015-06-07 DIAGNOSIS — Z1231 Encounter for screening mammogram for malignant neoplasm of breast: Secondary | ICD-10-CM

## 2015-06-21 ENCOUNTER — Encounter: Payer: Self-pay | Admitting: Family Medicine

## 2015-06-21 ENCOUNTER — Ambulatory Visit (INDEPENDENT_AMBULATORY_CARE_PROVIDER_SITE_OTHER): Payer: 59 | Admitting: Family Medicine

## 2015-06-21 VITALS — BP 98/57 | HR 69 | Temp 97.1°F | Ht 59.0 in | Wt 163.4 lb

## 2015-06-21 DIAGNOSIS — R5383 Other fatigue: Secondary | ICD-10-CM

## 2015-06-21 DIAGNOSIS — E782 Mixed hyperlipidemia: Secondary | ICD-10-CM | POA: Diagnosis not present

## 2015-06-21 DIAGNOSIS — E038 Other specified hypothyroidism: Secondary | ICD-10-CM

## 2015-06-21 DIAGNOSIS — I10 Essential (primary) hypertension: Secondary | ICD-10-CM

## 2015-06-21 DIAGNOSIS — Z794 Long term (current) use of insulin: Secondary | ICD-10-CM

## 2015-06-21 DIAGNOSIS — E1165 Type 2 diabetes mellitus with hyperglycemia: Secondary | ICD-10-CM | POA: Diagnosis not present

## 2015-06-21 DIAGNOSIS — IMO0002 Reserved for concepts with insufficient information to code with codable children: Secondary | ICD-10-CM

## 2015-06-21 DIAGNOSIS — E1169 Type 2 diabetes mellitus with other specified complication: Secondary | ICD-10-CM | POA: Diagnosis not present

## 2015-06-21 LAB — POCT GLYCOSYLATED HEMOGLOBIN (HGB A1C): Hemoglobin A1C: 7.8

## 2015-06-21 MED ORDER — INSULIN DETEMIR 100 UNIT/ML FLEXPEN: 110.0000 [IU] | PEN_INJECTOR | SUBCUTANEOUS | Status: DC

## 2015-06-21 NOTE — Progress Notes (Signed)
Subjective:  [name], female    DOB: 1951/05/11  Age: 64 y.o. MRN: [id number]  CC: Diabetes; Hypertension; Hyperlipidemia; and Hypothyroidism   HPI RENITA BROCKS presents for  follow-up of hypertension. Patient has no history of headache chest pain or shortness of breath or recent cough. Patient also denies symptoms of TIA such as numbness weakness lateralizing. Patient checks  blood pressure at home and has not had any elevated readings recently. Patient denies side effects from his medication. States taking it regularly.  Patient also  in for follow-up of elevated cholesterol. Doing well without complaints on current medication. Denies side effects of statin including myalgia and arthralgia and nausea. Also in today for liver function testing. Currently no chest pain, shortness of breath or other cardiovascular related symptoms noted.  Follow-up of diabetes. Patient does check blood sugar at home. Readings run between 120 and 187 Patient denies symptoms such as polyuria, polydipsia, excessive hunger, nausea No significant hypoglycemic spells noted. Medications as noted below. Taking them regularly without complication/adverse reaction being reported today.    History Bradley has a past medical history of Diabetes mellitus; Thyroid disease; Arthritis; and Cancer ([place]).   She has past surgical history that includes Thyroidectomy (2009) and Cesarean section.   Her family history is negative for Colon cancer, Esophageal cancer, Rectal cancer, and Stomach cancer.She reports that she quit smoking about 14 years ago. She has never used smokeless tobacco. She reports that she does not drink alcohol or use illicit drugs.  Current Outpatient Prescriptions on File Prior to Visit  Medication Sig Dispense Refill  . aspirin EC 81 MG tablet Take 81 mg by mouth daily.    . Cholecalciferol (VITAMIN D) 2000 UNITS tablet Take 1 tablet (2,000 Units total) by mouth daily. 30 tablet  11  . Choline Fenofibrate 135 MG capsule Take 1 capsule (135 mg total) by mouth daily. 30 capsule 5  . Glucose Blood (BLOOD GLUCOSE TEST STRIPS) STRP 1 Units by Other route 2 (two) times daily. 200 each 3  . Insulin Pen Needle (NOVOFINE) 30G X 8 MM MISC Inject 10 each into the skin as needed. 100 each 3  . JANUVIA 100 MG tablet TAKE 1 TABLET BY MOUTH DAILY. 90 tablet 1  . levothyroxine (SYNTHROID, LEVOTHROID) 112 MCG tablet Take qod and rotate with levothroxine 169mg po qod 45 tablet 2  . levothyroxine (SYNTHROID, LEVOTHROID) 125 MCG tablet Take 1 tablet (125 mcg total) by mouth every other day. 45 tablet 2  . lisinopril (PRINIVIL,ZESTRIL) 5 MG tablet Take 1 tablet (5 mg total) by mouth daily. 90 tablet 0  . metFORMIN (GLUCOPHAGE) 1000 MG tablet TAKE 1 TABLET BY MOUTH 2 TIMES DAILY WITH A MEAL. 180 tablet 3   No current facility-administered medications on file prior to visit.    ROS Review of Systems  Constitutional: Negative for fever, chills, diaphoresis, appetite change, fatigue and unexpected weight change.  HENT: Negative for congestion, ear pain, hearing loss, postnasal drip, rhinorrhea, sneezing, sore throat and trouble swallowing.   Eyes: Negative for pain.  Respiratory: Negative for cough, chest tightness and shortness of breath.   Cardiovascular: Negative for chest pain and palpitations.  Gastrointestinal: Negative for nausea, vomiting, abdominal pain, diarrhea and constipation.  Genitourinary: Negative for dysuria, frequency and menstrual problem.  Musculoskeletal: Negative for joint swelling and arthralgias.  Skin: Negative for rash.  Neurological: Negative for dizziness, weakness, numbness and headaches.  Psychiatric/Behavioral: Negative for dysphoric mood and agitation.    Objective:  BP 98/57 mmHg  Pulse 69  Temp(Src) 97.1 F (36.2 C) (Oral)  Ht 4' 11" (1.499 m)  Wt 163 lb 6.4 oz (74.118 kg)  BMI 32.99 kg/m2  BP Readings from Last 3 Encounters:  06/21/15 98/57    05/30/15 142/78  04/04/15 138/70    Wt Readings from Last 3 Encounters:  06/21/15 163 lb 6.4 oz (74.118 kg)  05/30/15 166 lb 12.8 oz (75.66 kg)  04/04/15 169 lb (76.658 kg)     Physical Exam  Constitutional: She is oriented to person, place, and time. She appears well-developed and well-nourished. No distress.  HENT:  Head: Normocephalic and atraumatic.  Right Ear: External ear normal.  Left Ear: External ear normal.  Nose: Nose normal.  Mouth/Throat: Oropharynx is clear and moist.  Eyes: Conjunctivae and EOM are normal. Pupils are equal, round, and reactive to light.  Neck: Normal range of motion. Neck supple. No thyromegaly present.  Cardiovascular: Normal rate, regular rhythm and normal heart sounds.   No murmur heard. Pulmonary/Chest: Effort normal and breath sounds normal. No respiratory distress. She has no wheezes. She has no rales.  Abdominal: Soft. Bowel sounds are normal. She exhibits no distension. There is no tenderness.  Lymphadenopathy:    She has no cervical adenopathy.  Neurological: She is alert and oriented to person, place, and time. She has normal reflexes.  Skin: Skin is warm and dry.  Psychiatric: She has a normal mood and affect. Her behavior is normal. Judgment and thought content normal.    Lab Results  Component Value Date   HGBA1C 7.8 06/21/2015   HGBA1C 8.1 03/18/2015   HGBA1C 9.0 12/16/2014    Lab Results  Component Value Date   WBC 5.5 06/21/2015   HGB 13.3 03/18/2015   HCT 37.7 06/21/2015   PLT 191 06/30/2008   GLUCOSE 88 06/21/2015   CHOL 180 06/21/2015   TRIG 153* 06/21/2015   HDL 36* 06/21/2015   LDLCALC 113* 06/21/2015   ALT 33* 06/21/2015   AST 42* 06/21/2015   NA 141 06/21/2015   K 3.9 06/21/2015   CL 101 06/21/2015   CREATININE 0.75 06/21/2015   BUN 11 06/21/2015   CO2 22 06/21/2015   TSH 1.150 03/18/2015   HGBA1C 7.8 06/21/2015    Mm Digital Screening Bilateral  06/08/2015  CLINICAL DATA:  Screening. EXAM:  DIGITAL SCREENING BILATERAL MAMMOGRAM WITH CAD COMPARISON:  Previous exam(s). ACR Breast Density Category b: There are scattered areas of fibroglandular density. FINDINGS: There are no findings suspicious for malignancy. Images were processed with CAD. IMPRESSION: No mammographic evidence of malignancy. A result letter of this screening mammogram will be mailed directly to the patient. RECOMMENDATION: Screening mammogram in one year. (Code:SM-B-01Y) BI-RADS CATEGORY  1: Negative. Electronically Signed   By: Lillia Mountain M.D.   On: 06/08/2015 14:12    Assessment & Plan:   Kalika was seen today for diabetes, hypertension, hyperlipidemia and hypothyroidism.  Diagnoses and all orders for this visit:  Uncontrolled type 2 diabetes mellitus with insulin therapy ([place]) -     CMP14+EGFR; Standing -     Microalbumin, urine -     CMP14+EGFR -     CBC with Differential/Platelet  Other fatigue -     Cancel: POCT CBC -     CBC with Differential/Platelet  Other specified hypothyroidism -     Cancel: POCT CBC -     POCT glycosylated hemoglobin (Hb A1C) -     Lipid panel -  CBC with Differential/Platelet  Mixed diabetic hyperlipidemia associated with type 2 diabetes mellitus (HCC) -     Cancel: POCT CBC -     POCT glycosylated hemoglobin (Hb A1C) -     Lipid panel -     CBC with Differential/Platelet -     Insulin Detemir (LEVEMIR) 100 UNIT/ML Pen; Inject 110 Units into the skin 1 day or 1 dose.  Long term current use of insulin (HCC) -     Cancel: POCT CBC -     POCT glycosylated hemoglobin (Hb A1C) -     Lipid panel -     CBC with Differential/Platelet  Essential hypertension   I have changed Ms. Cunanan's Insulin Detemir. I am also having her maintain her Choline Fenofibrate, aspirin EC, JANUVIA, Vitamin D, Insulin Pen Needle, BLOOD GLUCOSE TEST STRIPS, metFORMIN, levothyroxine, levothyroxine, and lisinopril.  Meds ordered this encounter  Medications  . Insulin Detemir (LEVEMIR) 100  UNIT/ML Pen    Sig: Inject 110 Units into the skin 1 day or 1 dose.    Dispense:  5 pen    Refill:  11     Follow-up: Return in about 3 months (around 09/21/2015) for diabetes, Hypothyroidism.  Claretta Fraise, M.D.

## 2015-06-21 NOTE — Patient Instructions (Signed)
Remember to bring glucose log to next ofice visit.  Work on losing 6 pounds. Continue diet to bring A1c down some more.  Diabetes and Foot Care Diabetes may cause you to have problems because of poor blood supply (circulation) to your feet and legs. This may cause the skin on your feet to become thinner, break easier, and heal more slowly. Your skin may become dry, and the skin may peel and crack. You may also have nerve damage in your legs and feet causing decreased feeling in them. You may not notice minor injuries to your feet that could lead to infections or more serious problems. Taking care of your feet is one of the most important things you can do for yourself.  HOME CARE INSTRUCTIONS  Wear shoes at all times, even in the house. Do not go barefoot. Bare feet are easily injured.  Check your feet daily for blisters, cuts, and redness. If you cannot see the bottom of your feet, use a mirror or ask someone for help.  Wash your feet with warm water (do not use hot water) and mild soap. Then pat your feet and the areas between your toes until they are completely dry. Do not soak your feet as this can dry your skin.  Apply a moisturizing lotion or petroleum jelly (that does not contain alcohol and is unscented) to the skin on your feet and to dry, brittle toenails. Do not apply lotion between your toes.  Trim your toenails straight across. Do not dig under them or around the cuticle. File the edges of your nails with an emery board or nail file.  Do not cut corns or calluses or try to remove them with medicine.  Wear clean socks or stockings every day. Make sure they are not too tight. Do not wear knee-high stockings since they may decrease blood flow to your legs.  Wear shoes that fit properly and have enough cushioning. To break in new shoes, wear them for just a few hours a day. This prevents you from injuring your feet. Always look in your shoes before you put them on to be sure there are  no objects inside.  Do not cross your legs. This may decrease the blood flow to your feet.  If you find a minor scrape, cut, or break in the skin on your feet, keep it and the skin around it clean and dry. These areas may be cleansed with mild soap and water. Do not cleanse the area with peroxide, alcohol, or iodine.  When you remove an adhesive bandage, be sure not to damage the skin around it.  If you have a wound, look at it several times a day to make sure it is healing.  Do not use heating pads or hot water bottles. They may burn your skin. If you have lost feeling in your feet or legs, you may not know it is happening until it is too late.  Make sure your health care provider performs a complete foot exam at least annually or more often if you have foot problems. Report any cuts, sores, or bruises to your health care provider immediately. SEEK MEDICAL CARE IF:   You have an injury that is not healing.  You have cuts or breaks in the skin.  You have an ingrown nail.  You notice redness on your legs or feet.  You feel burning or tingling in your legs or feet.  You have pain or cramps in your legs and feet.  Your legs or feet are numb.  Your feet always feel cold. SEEK IMMEDIATE MEDICAL CARE IF:   There is increasing redness, swelling, or pain in or around a wound.  There is a red line that goes up your leg.  Pus is coming from a wound.  You develop a fever or as directed by your health care provider.  You notice a bad smell coming from an ulcer or wound.   This information is not intended to replace advice given to you by your health care provider. Make sure you discuss any questions you have with your health care provider.   Document Released: 08/03/2000 Document Revised: 04/08/2013 Document Reviewed: 01/13/2013 Elsevier Interactive Patient Education [employer].

## 2015-06-22 LAB — CMP14+EGFR
ALBUMIN: 4.5 g/dL (ref 3.6–4.8)
ALK PHOS: 43 IU/L (ref 39–117)
ALT: 33 IU/L — ABNORMAL HIGH (ref 0–32)
AST: 42 IU/L — ABNORMAL HIGH (ref 0–40)
Albumin/Globulin Ratio: 2.4 (ref 1.1–2.5)
BILIRUBIN TOTAL: 0.5 mg/dL (ref 0.0–1.2)
BUN / CREAT RATIO: 15 (ref 11–26)
BUN: 11 mg/dL (ref 8–27)
CHLORIDE: 101 mmol/L (ref 97–106)
CO2: 22 mmol/L (ref 18–29)
Calcium: 8.7 mg/dL (ref 8.7–10.3)
Creatinine, Ser: 0.75 mg/dL (ref 0.57–1.00)
GFR calc Af Amer: 99 mL/min/{1.73_m2} (ref >59)
GFR calc non Af Amer: 86 mL/min/{1.73_m2} (ref >59)
GLUCOSE: 88 mg/dL (ref 65–99)
Globulin, Total: 1.9 g/dL (ref 1.5–4.5)
Potassium: 3.9 mmol/L (ref 3.5–5.2)
Sodium: 141 mmol/L (ref 136–144)
Total Protein: 6.4 g/dL (ref 6.0–8.5)

## 2015-06-22 LAB — CBC WITH DIFFERENTIAL/PLATELET
BASOS ABS: 0.1 10*3/uL (ref 0.0–0.2)
Basos: 1 %
EOS (ABSOLUTE): 0.2 10*3/uL (ref 0.0–0.4)
Eos: 4 %
HEMOGLOBIN: 12.8 g/dL (ref 11.1–15.9)
Hematocrit: 37.7 % (ref 34.0–46.6)
Immature Grans (Abs): 0 10*3/uL (ref 0.0–0.1)
Immature Granulocytes: 0 %
LYMPHS ABS: 1.9 10*3/uL (ref 0.7–3.1)
Lymphs: 34 %
MCH: 27.7 pg (ref 26.6–33.0)
MCHC: 34 g/dL (ref 31.5–35.7)
MCV: 82 fL (ref 79–97)
MONOCYTES: 7 %
MONOS ABS: 0.4 10*3/uL (ref 0.1–0.9)
Neutrophils Absolute: 2.9 10*3/uL (ref 1.4–7.0)
Neutrophils: 54 %
PLATELETS: 242 10*3/uL (ref 150–379)
RBC: 4.62 x10E6/uL (ref 3.77–5.28)
RDW: 13.3 % (ref 12.3–15.4)
WBC: 5.5 10*3/uL (ref 3.4–10.8)

## 2015-06-22 LAB — LIPID PANEL
CHOL/HDL RATIO: 5 ratio — AB (ref 0.0–4.4)
Cholesterol, Total: 180 mg/dL (ref 100–199)
HDL: 36 mg/dL — ABNORMAL LOW (ref >39)
LDL CALC: 113 mg/dL — AB (ref 0–99)
TRIGLYCERIDES: 153 mg/dL — AB (ref 0–149)
VLDL CHOLESTEROL CAL: 31 mg/dL (ref 5–40)

## 2015-06-22 LAB — MICROALBUMIN, URINE: MICROALBUM., U, RANDOM: 3.1 ug/mL

## 2015-08-24 ENCOUNTER — Other Ambulatory Visit: Payer: Self-pay | Admitting: Family Medicine

## 2015-08-24 MED FILL — TRUE METRIX GLUCOSE TEST ST: 90 days supply | Qty: 200 | Fill #1

## 2015-08-24 MED FILL — LISINOPRIL 5 MG TABLET: 5 | 90 days supply | Qty: 90 | Fill #0

## 2015-08-24 MED FILL — LEVOTHYROXINE 112 MCG TAB: 112 | 90 days supply | Qty: 45 | Fill #1

## 2015-08-24 MED FILL — JANUVIA 100 MG TABLET: 100 | 90 days supply | Qty: 90 | Fill #0

## 2015-08-24 MED FILL — FENOFIBRIC ACID DR 135 MG C: 135 | 30 days supply | Qty: 30 | Fill #4

## 2015-08-24 MED FILL — LEVOTHYROXINE 125 MCG TAB: 125 | 90 days supply | Qty: 45 | Fill #1

## 2015-08-29 ENCOUNTER — Ambulatory Visit: Payer: 59

## 2015-08-30 MED FILL — LEVEMIR FLEXTOUCH 100 UNITS: 100 | 15 days supply | Qty: 15 | Fill #6

## 2015-09-21 MED FILL — LEVEMIR FLEXTOUCH 100 UNITS: 100 | 15 days supply | Qty: 15 | Fill #7

## 2015-09-23 ENCOUNTER — Ambulatory Visit (INDEPENDENT_AMBULATORY_CARE_PROVIDER_SITE_OTHER): Payer: 59 | Admitting: Family Medicine

## 2015-09-23 ENCOUNTER — Encounter: Payer: Self-pay | Admitting: Family Medicine

## 2015-09-23 VITALS — BP 116/66 | HR 75 | Temp 98.1°F | Ht 59.0 in | Wt 162.6 lb

## 2015-09-23 DIAGNOSIS — E119 Type 2 diabetes mellitus without complications: Secondary | ICD-10-CM | POA: Diagnosis not present

## 2015-09-23 DIAGNOSIS — Z794 Long term (current) use of insulin: Secondary | ICD-10-CM | POA: Diagnosis not present

## 2015-09-23 DIAGNOSIS — E038 Other specified hypothyroidism: Secondary | ICD-10-CM

## 2015-09-23 LAB — POCT URINALYSIS DIPSTICK
Glucose, UA: NEGATIVE
NITRITE UA: NEGATIVE
PROTEIN UA: NEGATIVE
RBC UA: NEGATIVE
Spec Grav, UA: >=1.03
UROBILINOGEN UA: NEGATIVE
pH, UA: 5

## 2015-09-23 LAB — POCT GLYCOSYLATED HEMOGLOBIN (HGB A1C): Hemoglobin A1C: 8.6

## 2015-09-23 NOTE — Addendum Note (Signed)
Addended by: Earlene Plater on: 09/23/2015 10:34 AM   Modules accepted: Miquel Dunn

## 2015-09-23 NOTE — Progress Notes (Signed)
Subjective:  [name], female    DOB: 05-02-1951  Age: 65 y.o. MRN: [id number]  CC: Diabetes; Hyperlipidemia; and Hypothyroidism   HPI LANDRY LOOKINGBILL presents forFollow-up of diabetes. Patient checks blood sugar at home.   90-120 fasting and 150-180postprandial Patient denies symptoms such as polyuria, polydipsia, excessive hunger, nausea No significant hypoglycemic spells noted. Medications as noted below. Taking them regularly without complication/adverse reaction being reported today. Checking feet daily. Last eye appt was 1 yr ago. Plans appt. This month.   Patient presents for follow-up on  thyroid. She has a history of hypothyroidism for many years. It has been stable recently. Pt. denies any change in  voice, loss of hair, heat or cold intolerance. Energy level has been adequate to good. She denies constipation and diarrhea. No myxedema. Medication is as noted below. Verified that pt is taking it daily on an empty stomach. Well tolerated.  History Marchelle has a past medical history of Diabetes mellitus; Thyroid disease; Arthritis; and Cancer ([place]).   She has past surgical history that includes Thyroidectomy (2009) and Cesarean section.   Her family history is negative for Colon cancer, Esophageal cancer, Rectal cancer, and Stomach cancer.She reports that she quit smoking about 15 years ago. She has never used smokeless tobacco. She reports that she does not drink alcohol or use illicit drugs.  Current Outpatient Prescriptions on File Prior to Visit  Medication Sig Dispense Refill  . aspirin EC 81 MG tablet Take 81 mg by mouth daily.    . Cholecalciferol (VITAMIN D) 2000 UNITS tablet Take 1 tablet (2,000 Units total) by mouth daily. 30 tablet 11  . Choline Fenofibrate 135 MG capsule Take 1 capsule (135 mg total) by mouth daily. 30 capsule 5  . Glucose Blood (BLOOD GLUCOSE TEST STRIPS) STRP 1 Units by Other route 2 (two) times daily. 200 each 3  . Insulin  Detemir (LEVEMIR) 100 UNIT/ML Pen Inject 110 Units into the skin 1 day or 1 dose. 5 pen 11  . Insulin Pen Needle (NOVOFINE) 30G X 8 MM MISC Inject 10 each into the skin as needed. 100 each 3  . JANUVIA 100 MG tablet TAKE 1 TABLET BY MOUTH ONCE DAILY 90 tablet 0  . levothyroxine (SYNTHROID, LEVOTHROID) 112 MCG tablet Take qod and rotate with levothroxine 138mg po qod 45 tablet 2  . levothyroxine (SYNTHROID, LEVOTHROID) 125 MCG tablet Take 1 tablet (125 mcg total) by mouth every other day. 45 tablet 2  . lisinopril (PRINIVIL,ZESTRIL) 5 MG tablet TAKE 1 TABLET BY MOUTH DAILY. 90 tablet 0  . metFORMIN (GLUCOPHAGE) 1000 MG tablet TAKE 1 TABLET BY MOUTH 2 TIMES DAILY WITH A MEAL. 180 tablet 3   No current facility-administered medications on file prior to visit.    ROS Review of Systems  Constitutional: Negative for fever, activity change and appetite change.  HENT: Negative for congestion, rhinorrhea and sore throat.   Eyes: Negative for visual disturbance.  Respiratory: Negative for cough and shortness of breath.   Cardiovascular: Negative for chest pain and palpitations.  Gastrointestinal: Negative for nausea, abdominal pain and diarrhea.  Genitourinary: Negative for dysuria.  Musculoskeletal: Negative for myalgias and arthralgias.    Objective:  BP 116/66 mmHg  Pulse 75  Temp(Src) 98.1 F (36.7 C) (Oral)  Ht 4' 11" (1.499 m)  Wt 162 lb 9.6 oz (73.755 kg)  BMI 32.82 kg/m2  SpO2 97%  BP Readings from Last 3 Encounters:  09/23/15 116/66  06/21/15 98/57  05/30/15  142/78    Wt Readings from Last 3 Encounters:  09/23/15 162 lb 9.6 oz (73.755 kg)  06/21/15 163 lb 6.4 oz (74.118 kg)  05/30/15 166 lb 12.8 oz (75.66 kg)     Physical Exam  Constitutional: She is oriented to person, place, and time. She appears well-developed and well-nourished. No distress.  HENT:  Head: Normocephalic and atraumatic.  Right Ear: External ear normal.  Left Ear: External ear normal.  Nose: Nose  normal.  Mouth/Throat: Oropharynx is clear and moist.  Eyes: Conjunctivae and EOM are normal. Pupils are equal, round, and reactive to light.  Neck: Normal range of motion. Neck supple. No thyromegaly present.  Cardiovascular: Normal rate, regular rhythm and normal heart sounds.   No murmur heard. Pulmonary/Chest: Effort normal and breath sounds normal. No respiratory distress. She has no wheezes. She has no rales.  Abdominal: Soft. Bowel sounds are normal. She exhibits no distension. There is no tenderness.  Lymphadenopathy:    She has no cervical adenopathy.  Neurological: She is alert and oriented to person, place, and time. She has normal reflexes.  Skin: Skin is warm and dry.  Psychiatric: She has a normal mood and affect. Her behavior is normal. Judgment and thought content normal.    Lab Results  Component Value Date   HGBA1C 7.8 06/21/2015   HGBA1C 8.1 03/18/2015   HGBA1C 9.0 12/16/2014    Lab Results  Component Value Date   WBC 5.5 06/21/2015   HGB 13.3 03/18/2015   HCT 37.7 06/21/2015   PLT 242 06/21/2015   GLUCOSE 88 06/21/2015   CHOL 180 06/21/2015   TRIG 153* 06/21/2015   HDL 36* 06/21/2015   LDLCALC 113* 06/21/2015   ALT 33* 06/21/2015   AST 42* 06/21/2015   NA 141 06/21/2015   K 3.9 06/21/2015   CL 101 06/21/2015   CREATININE 0.75 06/21/2015   BUN 11 06/21/2015   CO2 22 06/21/2015   TSH 1.150 03/18/2015   HGBA1C 7.8 06/21/2015     Assessment & Plan:   Zavia was seen today for diabetes, hyperlipidemia and hypothyroidism.  Diagnoses and all orders for this visit:  Type 2 diabetes mellitus without complication, with long-term current use of insulin (HCC) -     Lipid panel -     POCT glycosylated hemoglobin (Hb A1C) -     CMP14+EGFR -     Microalbumin / creatinine urine ratio -     POCT urinalysis dipstick  Other specified hypothyroidism -     TSH + free T4  Long term current use of insulin ([place])    Continue meds as is. No changes needed  pending A1c  I am having Ms. Labombard maintain her Choline Fenofibrate, aspirin EC, Vitamin D, Insulin Pen Needle, BLOOD GLUCOSE TEST STRIPS, metFORMIN, levothyroxine, levothyroxine, Insulin Detemir, JANUVIA, and lisinopril.  No orders of the defined types were placed in this encounter.     Follow-up: No Follow-up on file.  Claretta Fraise, M.D.

## 2015-09-24 LAB — MICROALBUMIN / CREATININE URINE RATIO
Creatinine, Urine: 74.2 mg/dL
MICROALB/CREAT RATIO: 22.2 mg/g{creat} (ref 0.0–30.0)
MICROALBUM., U, RANDOM: 16.5 ug/mL

## 2015-09-24 LAB — CMP14+EGFR
ALT: 32 IU/L (ref 0–32)
AST: 36 IU/L (ref 0–40)
Albumin/Globulin Ratio: 2.1 (ref 1.1–2.5)
Albumin: 4.6 g/dL (ref 3.6–4.8)
Alkaline Phosphatase: 63 IU/L (ref 39–117)
BUN/Creatinine Ratio: 19 (ref 11–26)
BUN: 13 mg/dL (ref 8–27)
Bilirubin Total: 0.5 mg/dL (ref 0.0–1.2)
CALCIUM: 8.6 mg/dL — AB (ref 8.7–10.3)
CO2: 22 mmol/L (ref 18–29)
CREATININE: 0.7 mg/dL (ref 0.57–1.00)
Chloride: 99 mmol/L (ref 96–106)
GFR calc Af Amer: 107 mL/min/{1.73_m2} (ref >59)
GFR, EST NON AFRICAN AMERICAN: 93 mL/min/{1.73_m2} (ref >59)
GLOBULIN, TOTAL: 2.2 g/dL (ref 1.5–4.5)
Glucose: 130 mg/dL — ABNORMAL HIGH (ref 65–99)
Potassium: 4.4 mmol/L (ref 3.5–5.2)
Sodium: 139 mmol/L (ref 134–144)
Total Protein: 6.8 g/dL (ref 6.0–8.5)

## 2015-09-24 LAB — LIPID PANEL
CHOL/HDL RATIO: 5.9 ratio — AB (ref 0.0–4.4)
CHOLESTEROL TOTAL: 217 mg/dL — AB (ref 100–199)
HDL: 37 mg/dL — ABNORMAL LOW (ref >39)
LDL CALC: 138 mg/dL — AB (ref 0–99)
Triglycerides: 212 mg/dL — ABNORMAL HIGH (ref 0–149)
VLDL Cholesterol Cal: 42 mg/dL — ABNORMAL HIGH (ref 5–40)

## 2015-09-24 LAB — TSH+FREE T4
FREE T4: 1.89 ng/dL — AB (ref 0.82–1.77)
TSH: 1.58 u[IU]/mL (ref 0.450–4.500)

## 2015-10-06 ENCOUNTER — Telehealth: Payer: Self-pay | Admitting: *Deleted

## 2015-10-06 ENCOUNTER — Encounter: Payer: Self-pay | Admitting: *Deleted

## 2015-10-06 NOTE — Telephone Encounter (Signed)
Patient aware of lab results and willing to take a cholesterol medication but crestor gives her body aches. Please send in another script.

## 2015-10-07 ENCOUNTER — Other Ambulatory Visit: Payer: Self-pay | Admitting: Family Medicine

## 2015-10-07 MED ORDER — EZETIMIBE 10 MG PO TABS: 10.0000 mg | ORAL_TABLET | Freq: Every day | ORAL | Status: DC

## 2015-10-07 MED FILL — EZETIMIBE 10 MG TABLET: 10 | 90 days supply | Qty: 90 | Fill #0

## 2015-10-07 NOTE — Telephone Encounter (Signed)
Patient aware.

## 2015-10-07 NOTE — Telephone Encounter (Signed)
I sent in the requested prescription 

## 2015-10-10 MED FILL — LEVEMIR FLEXTOUCH 100 UNITS: 100 | 30 days supply | Qty: 30 | Fill #8

## 2015-10-10 MED FILL — NOVOFINE 30 NEEDLES: 30G X 8 MM | 90 days supply | Qty: 100 | Fill #2

## 2015-10-10 MED FILL — metFORMIN HCL 1000 MG TABS: 1000 | 90 days supply | Qty: 180 | Fill #2

## 2015-10-24 ENCOUNTER — Other Ambulatory Visit: Payer: Self-pay

## 2015-10-24 VITALS — BP 138/78 | HR 66 | Resp 16 | Ht 59.0 in | Wt 169.4 lb

## 2015-10-24 DIAGNOSIS — Z794 Long term (current) use of insulin: Principal | ICD-10-CM

## 2015-10-24 DIAGNOSIS — E119 Type 2 diabetes mellitus without complications: Secondary | ICD-10-CM

## 2015-10-24 NOTE — Patient Instructions (Addendum)
1. Check blood sugars 1 time a day before meals or 1 -2 hrs after meals.  Goals of 80-130 before meals and less than 180 after meals.  2. Plan to walk 2 times a week for 4 laps around the track for 20-25 minutes   Try to work up to 150 minutes a week. Plan to use Fit Board on rainy days twice a day for 10 minutes 3. Plan to take medications daily as ordered.  Plan to take Metformin with a snack if not with a meal 4. Plan to make dental appointment and keep eye exam on 11/11/15 5. Plan to see Dr. Livia Snellen on 11/09/15 and 12/23/15 6. Return to [facility] on 01/30/16 at 8:30AM at [place] office

## 2015-10-24 NOTE — Patient Outreach (Signed)
[place] [facility]) Care Management   10/24/2015  Morgan Schmidt 1951-03-14 Morgan  ZYLPHIA Schmidt is an 64 y.o. female.   Member seen for follow up office visit for [facility] program for self management of Type 2 diabetes  Subjective: Member states that her hemoglobin A1C was up to 8.6 when she saw her MD last month.  States she is taking her insulin and medications as order other than she might miss her second dose of Metformin when she is busy at work and she does not eat a meal.  States she stopped walking during the winter and she has not been using her Child psychotherapist but once a week.  States she does not eat many vegetables with her meals.  Objective:   Review of Systems  All other systems reviewed and are negative. Reviewed glucometer 7 day average-133 14 day average-131 30 day average-146  Physical Exam Today's Vitals   10/24/15 0835  BP: 138/78  Pulse: 66  Resp: 16  Height: 1.499 m (4\' 11" )  Weight: 169 lb 6.4 oz (76.839 kg)  SpO2: 97%  PainSc: 0-No pain   Current Medications:   Current Outpatient Prescriptions  Medication Sig Dispense Refill  . aspirin EC 81 MG tablet Take 81 mg by mouth daily.    . Cholecalciferol (VITAMIN D) 2000 UNITS tablet Take 1 tablet (2,000 Units total) by mouth daily. 30 tablet 11  . Choline Fenofibrate 135 MG capsule Take 1 capsule (135 mg total) by mouth daily. 30 capsule 5  . ezetimibe (ZETIA) 10 MG tablet Take 1 tablet (10 mg total) by mouth daily. For cholesterol 90 tablet 3  . Glucose Blood (BLOOD GLUCOSE TEST STRIPS) STRP 1 Units by Other route 2 (two) times daily. 200 each 3  . Insulin Detemir (LEVEMIR) 100 UNIT/ML Pen Inject 110 Units into the skin 1 day or 1 dose. 5 pen 11  . Insulin Pen Needle (NOVOFINE) 30G X 8 MM MISC Inject 10 each into the skin as needed. 100 each 3  . JANUVIA 100 MG tablet TAKE 1 TABLET BY MOUTH ONCE DAILY 90 tablet 0  . levothyroxine (SYNTHROID, LEVOTHROID) 112 MCG tablet Take qod and  rotate with levothroxine 179mcg po qod 45 tablet 2  . levothyroxine (SYNTHROID, LEVOTHROID) 125 MCG tablet Take 1 tablet (125 mcg total) by mouth every other day. 45 tablet 2  . lisinopril (PRINIVIL,ZESTRIL) 5 MG tablet TAKE 1 TABLET BY MOUTH DAILY. 90 tablet 0  . metFORMIN (GLUCOPHAGE) 1000 MG tablet TAKE 1 TABLET BY MOUTH 2 TIMES DAILY WITH A MEAL. 180 tablet 3   No current facility-administered medications for this visit.    Functional Status:   In your present state of health, do you have any difficulty performing the following activities: 10/24/2015 04/04/2015  Hearing? N N  Vision? N N  Difficulty concentrating or making decisions? N N  Walking or climbing stairs? N N  Dressing or bathing? N N  Doing errands, shopping? N N    Fall/Depression Screening:    PHQ 2/9 Scores 10/24/2015 09/23/2015 06/21/2015 05/30/2015 04/04/2015 01/03/2015 12/16/2014  PHQ - 2 Score 0 0 0 0 0 0 0    Assessment:  Member seen for follow up office visit for [facility] to [facility]ness program for self management of Type 2 diabetes. Member not meeting self management goal of hemoglobin A1C of 7 or below with last reading increased to  8.6. Member to have annual physical on 11/09/15. Member has  CBG readings with  ranges of 99-140 fasting and 141-202 post prandial. Member reports walking 1 times a week and doing exercises at home 1 times a week.Member is due for annual eye exam and dental check up.  Plan:  Check blood sugars 1 time a day before meals or 1 -2 hrs after meals.  Goals of 80-130 before meals and less than 180 after meals.  Plan to walk 2 times a week for 4 laps around the track for 20-25 minutes   Try to work up to 150 minutes a week. Plan to use Fit Board on rainy days twice a day for 10 minutes Plan to take medications daily as ordered.  Plan to take Metformin with a snack if unable to take with a meal. Plan to make dental appointment and keep eye exam on 11/11/15 Plan to see Dr. Livia Snellen on 11/09/15 and  12/23/15 Return to Link to Wellness on 01/30/16 at 8:30AM   [place] Problem One        Most Recent Value   Care Plan Problem One  Elevated blood sugars as evidenced by HgbA1C of 8.1   Role Documenting the Problem One  Care Management [place] for Problem One  Active   [facility] Long Term Goal (31-90 days)  Member will decrease HgbA1C to  7.0 or below in the next 90 days   [facility] Long Term Goal Start Date  10/24/15 Morgan Schmidt hemoglobin A1C increased to 8.6]   Interventions for Problem One Long Term Goal  Reviewed carbohydrate counting and portion control, Encouraged to eat more nonstarchy vegetables, Encouraged to start walking regularly again and to consider using the gym at [facility] when she gets off work,  Reinforced importance of exercise to lower blood sugars, Instructed to take her metformin with a snack if she forgets to take with a meal or does not eat a meal,  Reviewed blood sugar goals and how they relate to her hemoglobin A1C,  Reinforced to discuss with MD at next visit about referral to endocrinologist since her hemoglobin A1C is not improved after increase in her insulin    Peter Garter RN, [facility] Care Management Coordinator-Link to [place] Management [phone]

## 2015-11-09 ENCOUNTER — Other Ambulatory Visit: Payer: 59 | Admitting: Family Medicine

## 2015-11-14 MED FILL — LEVEMIR FLEXTOUCH 100 UNITS: 100 | 30 days supply | Qty: 30 | Fill #9

## 2015-11-28 ENCOUNTER — Other Ambulatory Visit: Payer: Self-pay | Admitting: Family Medicine

## 2015-11-28 MED FILL — LEVOTHYROXINE 112 MCG TAB: 112 | 90 days supply | Qty: 45 | Fill #2

## 2015-11-28 MED FILL — FENOFIBRIC ACID DR 135 MG C: 135 | 30 days supply | Qty: 30 | Fill #5

## 2015-11-28 MED FILL — LEVOTHYROXINE 125 MCG TAB: 125 | 90 days supply | Qty: 45 | Fill #2

## 2015-11-29 MED FILL — JANUVIA 100 MG TABLET: 100 | 90 days supply | Qty: 90 | Fill #0

## 2015-11-29 MED FILL — LISINOPRIL 5 MG TABLET: 5 | 90 days supply | Qty: 90 | Fill #0

## 2015-12-09 DIAGNOSIS — H5213 Myopia, bilateral: Secondary | ICD-10-CM | POA: Diagnosis not present

## 2015-12-09 LAB — HM DIABETES EYE EXAM

## 2015-12-17 ENCOUNTER — Other Ambulatory Visit: Payer: Self-pay | Admitting: Family Medicine

## 2015-12-17 DIAGNOSIS — E782 Mixed hyperlipidemia: Principal | ICD-10-CM

## 2015-12-17 DIAGNOSIS — E1169 Type 2 diabetes mellitus with other specified complication: Secondary | ICD-10-CM

## 2015-12-19 MED ORDER — INSULIN DETEMIR 100 UNIT/ML FLEXPEN: 110.0000 [IU] | PEN_INJECTOR | SUBCUTANEOUS | Status: DC

## 2015-12-19 MED FILL — LEVEMIR FLEXTOUCH 100 UNITS: 100 | 26 days supply | Qty: 30 | Fill #0

## 2015-12-19 NOTE — Telephone Encounter (Signed)
Patient aware that medication has been sent to the pharmacy 

## 2015-12-21 ENCOUNTER — Other Ambulatory Visit: Payer: 59

## 2015-12-21 DIAGNOSIS — E1165 Type 2 diabetes mellitus with hyperglycemia: Secondary | ICD-10-CM

## 2015-12-21 DIAGNOSIS — Z794 Long term (current) use of insulin: Secondary | ICD-10-CM | POA: Diagnosis not present

## 2015-12-21 DIAGNOSIS — Z7689 Persons encountering health services in other specified circumstances: Secondary | ICD-10-CM | POA: Diagnosis not present

## 2015-12-21 DIAGNOSIS — IMO0002 Reserved for concepts with insufficient information to code with codable children: Secondary | ICD-10-CM

## 2015-12-22 LAB — CMP14+EGFR
A/G RATIO: 2.3 — AB (ref 1.2–2.2)
ALBUMIN: 4.6 g/dL (ref 3.6–4.8)
ALT: 32 IU/L (ref 0–32)
AST: 36 IU/L (ref 0–40)
Alkaline Phosphatase: 62 IU/L (ref 39–117)
BUN / CREAT RATIO: 16 (ref 12–28)
BUN: 12 mg/dL (ref 8–27)
Bilirubin Total: 0.5 mg/dL (ref 0.0–1.2)
CALCIUM: 8.8 mg/dL (ref 8.7–10.3)
CO2: 23 mmol/L (ref 18–29)
Chloride: 99 mmol/L (ref 96–106)
Creatinine, Ser: 0.77 mg/dL (ref 0.57–1.00)
GFR, EST AFRICAN AMERICAN: 96 mL/min/{1.73_m2} (ref >59)
GFR, EST NON AFRICAN AMERICAN: 83 mL/min/{1.73_m2} (ref >59)
Globulin, Total: 2 g/dL (ref 1.5–4.5)
Glucose: 114 mg/dL — ABNORMAL HIGH (ref 65–99)
POTASSIUM: 4.3 mmol/L (ref 3.5–5.2)
Sodium: 142 mmol/L (ref 134–144)
TOTAL PROTEIN: 6.6 g/dL (ref 6.0–8.5)

## 2015-12-23 ENCOUNTER — Ambulatory Visit (INDEPENDENT_AMBULATORY_CARE_PROVIDER_SITE_OTHER): Payer: 59 | Admitting: Family Medicine

## 2015-12-23 ENCOUNTER — Encounter: Payer: Self-pay | Admitting: Family Medicine

## 2015-12-23 VITALS — BP 125/66 | HR 83 | Temp 97.8°F | Ht 59.0 in | Wt 164.8 lb

## 2015-12-23 DIAGNOSIS — E1169 Type 2 diabetes mellitus with other specified complication: Secondary | ICD-10-CM | POA: Diagnosis not present

## 2015-12-23 DIAGNOSIS — E782 Mixed hyperlipidemia: Secondary | ICD-10-CM | POA: Diagnosis not present

## 2015-12-23 DIAGNOSIS — Z794 Long term (current) use of insulin: Secondary | ICD-10-CM

## 2015-12-23 DIAGNOSIS — E038 Other specified hypothyroidism: Secondary | ICD-10-CM | POA: Diagnosis not present

## 2015-12-23 NOTE — Patient Instructions (Signed)
Remifemin can help stabilize female hormones adn decrease the feeling of constantly being hot.

## 2015-12-23 NOTE — Progress Notes (Signed)
Subjective:  [name], female    DOB: [birth year]  Age: 65 y.o. MRN: [id number]  CC: Follow-up   HPI Morgan Schmidt presents forFollow-up of diabetes. Patient checks blood sugar at home.   under 120 fasting and 140-165 postprandial Patient denies symptoms such as polyuria, polydipsia, excessive hunger, nausea No significant hypoglycemic spells noted. Medications as noted below. Taking them regularly without complication/adverse reaction being reported today. Checking feet daily.  Patient presents for follow-up on  thyroid. The patient has a history of hypothyroidism for many years. It has been stable recently. Pt. denies any change in  voice, loss of hair, heat or cold intolerance. Energy level has been adequate to good. Patient denies constipation and diarrhea. No myxedema. Medication is as noted below. Verified that pt is taking it daily on an empty stomach. Well tolerated.  Patient in for follow-up of elevated cholesterol. Doing well without complaints on current medication. Denies side effects of statin including myalgia and arthralgia and nausea. Also in today for liver function testing. Currently no chest pain, shortness of breath or other cardiovascular related symptoms noted.   History Morgan Schmidt has a past medical history of Diabetes mellitus; Thyroid disease; Arthritis; and Cancer ([place]).   She has past surgical history that includes Thyroidectomy (2009) and Cesarean section.   Her family history is negative for Colon cancer, Esophageal cancer, Rectal cancer, and Stomach cancer.She reports that she quit smoking about 15 years ago. She has never used smokeless tobacco. She reports that she does not drink alcohol or use illicit drugs.  Current Outpatient Prescriptions on File Prior to Visit  Medication Sig Dispense Refill  . aspirin EC 81 MG tablet Take 81 mg by mouth daily.    . Cholecalciferol (VITAMIN D) 2000 UNITS tablet Take 1 tablet (2,000 Units total) by  mouth daily. 30 tablet 11  . Choline Fenofibrate 135 MG capsule Take 1 capsule (135 mg total) by mouth daily. 30 capsule 5  . ezetimibe (ZETIA) 10 MG tablet Take 1 tablet (10 mg total) by mouth daily. For cholesterol 90 tablet 3  . Glucose Blood (BLOOD GLUCOSE TEST STRIPS) STRP 1 Units by Other route 2 (two) times daily. 200 each 3  . Insulin Detemir (LEVEMIR) 100 UNIT/ML Pen Inject 110 Units into the skin 1 day or 1 dose. 5 pen 11  . Insulin Pen Needle (NOVOFINE) 30G X 8 MM MISC Inject 10 each into the skin as needed. 100 each 3  . JANUVIA 100 MG tablet TAKE 1 TABLET BY MOUTH ONCE DAILY 90 tablet 1  . levothyroxine (SYNTHROID, LEVOTHROID) 112 MCG tablet Take qod and rotate with levothroxine 188mg po qod 45 tablet 2  . levothyroxine (SYNTHROID, LEVOTHROID) 125 MCG tablet Take 1 tablet (125 mcg total) by mouth every other day. 45 tablet 2  . lisinopril (PRINIVIL,ZESTRIL) 5 MG tablet TAKE 1 TABLET BY MOUTH DAILY. 90 tablet 1  . metFORMIN (GLUCOPHAGE) 1000 MG tablet TAKE 1 TABLET BY MOUTH 2 TIMES DAILY WITH A MEAL. 180 tablet 3   No current facility-administered medications on file prior to visit.    ROS Review of Systems  Constitutional: Negative for fever, activity change and appetite change.  HENT: Negative for congestion, rhinorrhea and sore throat.   Eyes: Negative for visual disturbance.  Respiratory: Negative for cough and shortness of breath.   Cardiovascular: Negative for chest pain and palpitations.  Gastrointestinal: Negative for nausea, abdominal pain and diarrhea.  Genitourinary: Negative for dysuria.  Musculoskeletal: Negative for myalgias  and arthralgias.    Objective:  BP 125/66 mmHg  Pulse 83  Temp(Src) 97.8 F (36.6 C) (Oral)  Ht _0  (1.499 m)  Wt 164 lb 12.8 oz (74.753 kg)  BMI 33.27 kg/m2  SpO2 95%  BP Readings from Last 3 Encounters:  12/23/15 125/66  10/24/15 138/78  09/23/15 116/66    Wt Readings from Last 3 Encounters:  12/23/15 164 lb 12.8 oz  (74.753 kg)  10/24/15 169 lb 6.4 oz (76.839 kg)  09/23/15 162 lb 9.6 oz (73.755 kg)     Physical Exam  Constitutional: She is oriented to person, place, and time. She appears well-developed and well-nourished. No distress.  HENT:  Head: Normocephalic and atraumatic.  Right Ear: External ear normal.  Left Ear: External ear normal.  Nose: Nose normal.  Mouth/Throat: Oropharynx is clear and moist.  Eyes: Conjunctivae and EOM are normal. Pupils are equal, round, and reactive to light.  Neck: Normal range of motion. Neck supple. No thyromegaly present.  Cardiovascular: Normal rate, regular rhythm and normal heart sounds.   No murmur heard. Pulmonary/Chest: Effort normal and breath sounds normal. No respiratory distress. She has no wheezes. She has no rales.  Abdominal: Soft. Bowel sounds are normal. She exhibits no distension. There is no tenderness.  Lymphadenopathy:    She has no cervical adenopathy.  Neurological: She is alert and oriented to person, place, and time. She has normal reflexes.  Skin: Skin is warm and dry.  Psychiatric: She has a normal mood and affect. Her behavior is normal. Judgment and thought content normal.    Lab Results  Component Value Date   HGBA1C 8.6 09/23/2015   HGBA1C 7.8 06/21/2015   HGBA1C 8.1 03/18/2015    Lab Results  Component Value Date   WBC 5.5 06/21/2015   HGB 13.3 03/18/2015   HCT 37.7 06/21/2015   PLT 242 06/21/2015   GLUCOSE 114* 12/21/2015   CHOL WILL FOLLOW 12/21/2015   TRIG WILL FOLLOW 12/21/2015   HDL WILL FOLLOW 12/21/2015   LDLCALC WILL FOLLOW 12/21/2015   ALT 32 12/21/2015   AST 36 12/21/2015   NA 142 12/21/2015   K 4.3 12/21/2015   CL 99 12/21/2015   CREATININE 0.77 12/21/2015   BUN 12 12/21/2015   CO2 23 12/21/2015   TSH WILL FOLLOW 12/21/2015   HGBA1C 8.6 09/23/2015     Assessment & Plan:   Morgan Schmidt was seen today for follow-up.  Diagnoses and all orders for this visit:  Mixed diabetic hyperlipidemia  associated with type 2 diabetes mellitus ([place]) -     Bayer DCA Hb A1c Waived; Standing -     Lipid panel; Standing  Other specified hypothyroidism -     CBC with Differential/Platelet; Standing -     CMP14+EGFR; Standing -     T4 AND TSH; Standing  Long term current use of insulin ([place])      I am having Ms. Paquette maintain her Choline Fenofibrate, aspirin EC, Vitamin D, Insulin Pen Needle, BLOOD GLUCOSE TEST STRIPS, metFORMIN, levothyroxine, levothyroxine, ezetimibe, lisinopril, JANUVIA, and Insulin Detemir.  No orders of the defined types were placed in this encounter.     Follow-up: Return in about 3 months (around 03/24/2016) for diabetes.  Claretta Fraise, M.D.

## 2015-12-24 LAB — LIPID PANEL
CHOLESTEROL TOTAL: 172 mg/dL (ref 100–199)
Chol/HDL Ratio: 5.4 ratio units — ABNORMAL HIGH (ref 0.0–4.4)
HDL: 32 mg/dL — ABNORMAL LOW (ref >39)
LDL Calculated: 95 mg/dL (ref 0–99)
TRIGLYCERIDES: 227 mg/dL — AB (ref 0–149)
VLDL Cholesterol Cal: 45 mg/dL — ABNORMAL HIGH (ref 5–40)

## 2015-12-24 LAB — TSH: TSH: 5.01 u[IU]/mL — ABNORMAL HIGH (ref 0.450–4.500)

## 2015-12-24 LAB — T4, FREE: FREE T4: 2.02 ng/dL — AB (ref 0.82–1.77)

## 2015-12-24 LAB — SPECIMEN STATUS REPORT

## 2015-12-26 ENCOUNTER — Other Ambulatory Visit: Payer: Self-pay | Admitting: Family Medicine

## 2015-12-26 DIAGNOSIS — E039 Hypothyroidism, unspecified: Secondary | ICD-10-CM

## 2015-12-26 MED ORDER — LEVOTHYROXINE SODIUM 125 MCG PO TABS: 125.0000 ug | ORAL_TABLET | Freq: Every day | ORAL | Status: DC

## 2015-12-26 NOTE — Progress Notes (Signed)
Patient aware.

## 2016-01-03 ENCOUNTER — Other Ambulatory Visit: Payer: Self-pay

## 2016-01-03 MED ORDER — CHOLINE FENOFIBRATE 135 MG PO CPDR: 135.0000 mg | DELAYED_RELEASE_CAPSULE | Freq: Every day | ORAL | Status: DC

## 2016-01-03 MED FILL — FENOFIBRIC ACID DR 135 MG C: 135 | 30 days supply | Qty: 30 | Fill #0

## 2016-01-04 MED FILL — EZETIMIBE 10 MG TABLET: 10 | 90 days supply | Qty: 90 | Fill #1

## 2016-01-18 MED FILL — LEVEMIR FLEXTOUCH 100 UNITS: 100 | 26 days supply | Qty: 30 | Fill #1

## 2016-01-27 MED FILL — LEVOTHYROXINE 125 MCG TAB: 125 | 90 days supply | Qty: 90 | Fill #0

## 2016-01-30 ENCOUNTER — Other Ambulatory Visit: Payer: Self-pay

## 2016-01-30 VITALS — BP 144/82 | HR 73 | Resp 16 | Ht 59.0 in | Wt 165.6 lb

## 2016-01-30 DIAGNOSIS — E782 Mixed hyperlipidemia: Principal | ICD-10-CM

## 2016-01-30 DIAGNOSIS — E1169 Type 2 diabetes mellitus with other specified complication: Secondary | ICD-10-CM

## 2016-01-30 LAB — POCT GLYCOSYLATED HEMOGLOBIN (HGB A1C): Hemoglobin A1C: 9.3

## 2016-01-30 NOTE — Patient Instructions (Signed)
1. Check blood sugars 1 time a day before meals or 1 -2 hours after meals.  Goals of 80-130 before meals and less than 180 after meals.  2. Plan to eat 30-45 grams (2-3 servings) of carbohydrate a meal and 15Grams for snacks.  Have protein with snacks 3. Plan to walk 3 times a week for 4 laps around the track for 20-25 minutes   Try to work up to 150 minutes a week. Plan to use Fit Board  or [employer] on rainy days  4. Plan to make dental appointment.   5. Plan to see Dr. Livia Snellen on 03/23/16 6. Return to [facility] to Wellness on 05/14/16 at 8:30AM

## 2016-01-30 NOTE — Progress Notes (Signed)
Quick Note:  Please contact the patient regarding:Poor control of DM. Take meds regularly, exercise daily, follow strict DM diet. If already doing these things, let me know and I will adjust meds.  ______

## 2016-01-30 NOTE — Patient Outreach (Signed)
[facility]) Care Management   01/30/2016  Morgan Schmidt 01-06-1951 [id number]  Morgan Schmidt is an 65 y.o. female.   Member seen for follow up office visit for Link to Wellness program for self management of Type 2 diabetes  Subjective: Member states that she has been trying to watch her portions closer and she is using a smaller plate.  States she is trying to walk but the weather has been keeping her from  walking regularly.  States she got a [employer] that she plan to start using twice a weekStates she saw Dr. Livia Snellen in May but they did not do a hemoglobin A1C.  States she is to see him in August.  Objective:   Review of Systems  All other systems reviewed and are negative. POC HemoglobinA1C- 9.3%  Physical Exam Today's Vitals   01/30/16 0835  BP: 144/82  Pulse: 73  Resp: 16  Height: 1.499 m (4\' 11" )  Weight: 165 lb 9.6 oz (75.116 kg)  SpO2: 95%  PainSc: 0-No pain   Encounter Medications:   Outpatient Encounter Prescriptions as of 01/30/2016  Medication Sig  . aspirin EC 81 MG tablet Take 81 mg by mouth daily.  . Cholecalciferol (VITAMIN D) 2000 UNITS tablet Take 1 tablet (2,000 Units total) by mouth daily.  . Choline Fenofibrate 135 MG capsule Take 1 capsule (135 mg total) by mouth daily.  Marland Kitchen ezetimibe (ZETIA) 10 MG tablet Take 1 tablet (10 mg total) by mouth daily. For cholesterol  . Glucose Blood (BLOOD GLUCOSE TEST STRIPS) STRP 1 Units by Other route 2 (two) times daily.  . Insulin Detemir (LEVEMIR) 100 UNIT/ML Pen Inject 110 Units into the skin 1 day or 1 dose. (Patient taking differently: Inject 125 Units into the skin 1 day or 1 dose. )  . Insulin Pen Needle (NOVOFINE) 30G X 8 MM MISC Inject 10 each into the skin as needed.  Marland Kitchen JANUVIA 100 MG tablet TAKE 1 TABLET BY MOUTH ONCE DAILY  . levothyroxine (SYNTHROID, LEVOTHROID) 125 MCG tablet Take 1 tablet (125 mcg total) by mouth daily.  Marland Kitchen lisinopril (PRINIVIL,ZESTRIL) 5 MG tablet TAKE 1 TABLET BY  MOUTH DAILY.  . metFORMIN (GLUCOPHAGE) 1000 MG tablet TAKE 1 TABLET BY MOUTH 2 TIMES DAILY WITH A MEAL.   No facility-administered encounter medications on file as of 01/30/2016.    Functional Status:   In your present state of health, do you have any difficulty performing the following activities: 01/30/2016 10/24/2015  Hearing? N N  Vision? N N  Difficulty concentrating or making decisions? N N  Walking or climbing stairs? N N  Dressing or bathing? N N  Doing errands, shopping? N N    Fall/Depression Screening:    PHQ 2/9 Scores 01/30/2016 12/23/2015 10/24/2015 09/23/2015 06/21/2015 05/30/2015 04/04/2015  PHQ - 2 Score 0 0 0 0 0 0 0    Assessment:  Member seen for follow up office visit for Link to Wellness program for self management of Type 2 diabetes. Member is not meeting self management goal of hemoglobin A1C of 7% or below with last reading increased to 9.3% today.Member might benefit from referral to endocrinologist.   Member did not bring glucometer to meeting. Reports Blood sugars range 100-130 fasting and 180-210 post prandial.  Member reports walking 1 times a week and doing exercises at home 1 times a week.Member had annual eye exam and is due for  dental check up.  Plan:   Check blood sugars 1 time  a day before meals or 1 -2 hours after meals.  Goals of 80-130 before meals and less than 180 after meals.  Plan to eat 30-45 grams (2-3 servings) of carbohydrate a meal and 15Grams for snacks.  Have protein with snacks Plan to walk 3 times a week for 4 laps around the track for 20-25 minutes   Try to work up to 150 minutes a week. Plan to use Fit Board  or [employer] on rainy days  Plan to make dental appointment.   Plan to see Dr. Livia Snellen on 03/23/16 Return to Link to Wellness on 05/14/16 at 8:30AM   [place] Problem One        Most Recent Value   Care Plan Problem One  Elevated blood sugars as evidenced by HgbA1C of 8.1   Role Documenting the Problem One  Care Management  [place] for Problem One  Active   THN Long Term Goal (31-90 days)  Member will decrease HgbA1C to  7.0 or below in the next 90 days   THN Long Term Goal Start Date  01/30/16 [Continue hemoglobin A1C increased to 9.3% today]   Interventions for Problem One Long Term Goal  Reviewed carbohydrate counting and portion control, Encouraged to eat more nonstarchy vegetables, Encouraged to start walking regularly again and to consider using the gym at [facility] when she gets off work,  Reinforced importance of exercise to lower blood sugars, Reinforced to take her metformin with a snack if she forgets to take with a meal or does not eat a meal,  Reviewed blood sugar goals and how they relate to her hemoglobin A1C,  Encouraged to call MD to discuss  getting a referral to endocrinologist since her hemoglobin A1C is not improved after increase in her insulin    Peter Garter RN, [facility] Care Management Coordinator-Link to [place] Management [phone]

## 2016-02-06 MED FILL — LEVEMIR FLEXTOUCH 100 UNITS: 100 | 26 days supply | Qty: 30 | Fill #2

## 2016-02-08 ENCOUNTER — Ambulatory Visit: Payer: 59 | Admitting: Family Medicine

## 2016-03-05 MED FILL — JANUVIA 100 MG TABLET: 100 | 90 days supply | Qty: 90 | Fill #1

## 2016-03-05 MED FILL — NOVOFINE 30 NEEDLES: 30G X 8 MM | 90 days supply | Qty: 100 | Fill #3

## 2016-03-14 MED FILL — LEVEMIR FLEXTOUCH 100 UNITS: 100 | 26 days supply | Qty: 30 | Fill #3

## 2016-03-23 ENCOUNTER — Ambulatory Visit (INDEPENDENT_AMBULATORY_CARE_PROVIDER_SITE_OTHER): Payer: 59 | Admitting: Family Medicine

## 2016-03-23 ENCOUNTER — Ambulatory Visit: Payer: 59 | Admitting: Family Medicine

## 2016-03-23 ENCOUNTER — Encounter: Payer: Self-pay | Admitting: Family Medicine

## 2016-03-23 VITALS — BP 118/73 | HR 78 | Temp 97.8°F | Ht <= 58 in | Wt 164.7 lb

## 2016-03-23 DIAGNOSIS — I10 Essential (primary) hypertension: Secondary | ICD-10-CM

## 2016-03-23 DIAGNOSIS — Z794 Long term (current) use of insulin: Secondary | ICD-10-CM

## 2016-03-23 DIAGNOSIS — E038 Other specified hypothyroidism: Secondary | ICD-10-CM | POA: Diagnosis not present

## 2016-03-23 DIAGNOSIS — Z139 Encounter for screening, unspecified: Secondary | ICD-10-CM

## 2016-03-23 DIAGNOSIS — Z0000 Encounter for general adult medical examination without abnormal findings: Secondary | ICD-10-CM

## 2016-03-23 DIAGNOSIS — IMO0001 Reserved for inherently not codable concepts without codable children: Secondary | ICD-10-CM

## 2016-03-23 DIAGNOSIS — Z01419 Encounter for gynecological examination (general) (routine) without abnormal findings: Secondary | ICD-10-CM

## 2016-03-23 DIAGNOSIS — E1169 Type 2 diabetes mellitus with other specified complication: Secondary | ICD-10-CM | POA: Diagnosis not present

## 2016-03-23 DIAGNOSIS — E782 Mixed hyperlipidemia: Secondary | ICD-10-CM

## 2016-03-23 DIAGNOSIS — K635 Polyp of colon: Secondary | ICD-10-CM

## 2016-03-23 DIAGNOSIS — E039 Hypothyroidism, unspecified: Secondary | ICD-10-CM | POA: Diagnosis not present

## 2016-03-23 MED ORDER — DULAGLUTIDE 1.5 MG/0.5ML ~~LOC~~ SOAJ: SUBCUTANEOUS | 11 refills | Status: DC

## 2016-03-23 MED FILL — TRULICITY 1.5 MG/0.5 ML PEN: 1.5 | 28 days supply | Qty: 2 | Fill #0

## 2016-03-23 NOTE — Progress Notes (Signed)
Subjective:  [name], female    DOB: [birth year]  Age: 65 y.o. MRN: [id number]  CC: Annual Exam   HPI Morgan Schmidt presents for CPE.  She is also due  follow-up of hypertension. Patient has no history of headache chest pain or shortness of breath or recent cough. Patient also denies symptoms of TIA such as numbness weakness lateralizing. Patient checks  blood pressure at home and has not had any elevated readings recently. Patient denies side effects from his medication. States taking it regularly.  Patient also  in for follow-up of elevated cholesterol. Doing well without complaints on current medication. Denies side effects of statin including myalgia and arthralgia and nausea. Also in today for liver function testing. Currently no chest pain, shortness of breath or other cardiovascular related symptoms noted.  Follow-up of diabetes. Patient does check blood sugar at home. Readings run between 125 fasting and 180 PP Patient denies symptoms such as polyuria, polydipsia, excessive hunger, nausea No significant hypoglycemic spells noted. Medications as noted below. Taking them regularly without complication/adverse reaction being reported today.    History Morgan Schmidt has a past medical history of Arthritis; Cancer ([place]); Diabetes mellitus; and Thyroid disease.   She has a past surgical history that includes Thyroidectomy (2009) and Cesarean section.   Her family history is not on file.She reports that she quit smoking about 15 years ago. She has never used smokeless tobacco. She reports that she does not drink alcohol or use drugs.  Current Outpatient Prescriptions on File Prior to Visit  Medication Sig Dispense Refill  . aspirin EC 81 MG tablet Take 81 mg by mouth daily.    . Cholecalciferol (VITAMIN D) 2000 UNITS tablet Take 1 tablet (2,000 Units total) by mouth daily. 30 tablet 11  . Choline Fenofibrate 135 MG capsule Take 1 capsule (135 mg total) by mouth daily.  30 capsule 5  . ezetimibe (ZETIA) 10 MG tablet Take 1 tablet (10 mg total) by mouth daily. For cholesterol 90 tablet 3  . Glucose Blood (BLOOD GLUCOSE TEST STRIPS) STRP 1 Units by Other route 2 (two) times daily. 200 each 3  . Insulin Detemir (LEVEMIR) 100 UNIT/ML Pen Inject 110 Units into the skin 1 day or 1 dose. (Patient taking differently: Inject 125 Units into the skin 1 day or 1 dose. ) 5 pen 11  . Insulin Pen Needle (NOVOFINE) 30G X 8 MM MISC Inject 10 each into the skin as needed. 100 each 3  . levothyroxine (SYNTHROID, LEVOTHROID) 125 MCG tablet Take 1 tablet (125 mcg total) by mouth daily. 90 tablet 2  . lisinopril (PRINIVIL,ZESTRIL) 5 MG tablet TAKE 1 TABLET BY MOUTH DAILY. 90 tablet 1  . metFORMIN (GLUCOPHAGE) 1000 MG tablet TAKE 1 TABLET BY MOUTH 2 TIMES DAILY WITH A MEAL. 180 tablet 3   No current facility-administered medications on file prior to visit.     ROS Review of Systems  Constitutional: Negative for appetite change, chills, diaphoresis, fatigue, fever and unexpected weight change.  HENT: Negative for congestion, ear pain, hearing loss, nosebleeds, postnasal drip, rhinorrhea, sneezing, sore throat, tinnitus and trouble swallowing.   Eyes: Negative for photophobia, pain, discharge and redness.  Respiratory: Negative for cough, chest tightness, shortness of breath and wheezing.   Cardiovascular: Negative for chest pain, palpitations and leg swelling.  Gastrointestinal: Negative for abdominal pain, blood in stool, constipation, diarrhea, nausea and vomiting.  Endocrine: Negative for cold intolerance, heat intolerance, polydipsia, polyphagia and polyuria.  Genitourinary: Negative  for dysuria, flank pain, frequency, hematuria, menstrual problem and urgency.  Musculoskeletal: Negative for arthralgias, back pain, joint swelling, myalgias and neck pain.  Skin: Negative for rash.  Allergic/Immunologic: Negative for environmental allergies.  Neurological: Negative for dizziness,  tremors, seizures, weakness, numbness and headaches.  Hematological: Does not bruise/bleed easily.  Psychiatric/Behavioral: Negative for agitation, dysphoric mood, hallucinations and suicidal ideas. The patient is not nervous/anxious.     Objective:  BP 118/73 (BP Location: Left Arm, Patient Position: Sitting, Cuff Size: Normal)   Pulse 78   Temp 97.8 F (36.6 C) (Oral)   Ht 4' 9.5" (1.461 m)   Wt 164 lb 11.2 oz (74.7 kg)   SpO2 99%   BMI 35.02 kg/m   BP Readings from Last 3 Encounters:  03/23/16 118/73  01/30/16 (!) 144/82  12/23/15 125/66    Wt Readings from Last 3 Encounters:  03/23/16 164 lb 11.2 oz (74.7 kg)  01/30/16 165 lb 9.6 oz (75.1 kg)  12/23/15 164 lb 12.8 oz (74.8 kg)     Physical Exam  Constitutional: She is oriented to person, place, and time. She appears well-developed and well-nourished. No distress.  HENT:  Head: Normocephalic and atraumatic.  Right Ear: External ear normal.  Left Ear: External ear normal.  Nose: Nose normal.  Mouth/Throat: Oropharynx is clear and moist.  Eyes: Conjunctivae and EOM are normal. Pupils are equal, round, and reactive to light.  Neck: Normal range of motion. Neck supple. No thyromegaly present.  Cardiovascular: Normal rate, regular rhythm and normal heart sounds.   No murmur heard. Pulmonary/Chest: Effort normal and breath sounds normal. No respiratory distress. She has no wheezes. She has no rales. Right breast exhibits no inverted nipple, no mass and no tenderness. Left breast exhibits no inverted nipple, no mass and no tenderness. Breasts are symmetrical.  Abdominal: Soft. Normal appearance and bowel sounds are normal. She exhibits no distension, no abdominal bruit and no mass. There is no splenomegaly or hepatomegaly. There is no tenderness. There is no tenderness at McBurney's point and negative Murphy's sign. Hernia confirmed negative in the right inguinal area and confirmed negative in the left inguinal area.    Genitourinary: Vagina normal. Rectal exam shows external hemorrhoid. Rectal exam shows no internal hemorrhoid, no mass and no tenderness. There is no rash, tenderness, lesion or injury on the right labia. There is no rash, tenderness, lesion or injury on the left labia. Cervix exhibits no motion tenderness, no discharge and no friability. No erythema or bleeding in the vagina. No foreign body in the vagina. No signs of injury around the vagina. No vaginal discharge found.  Musculoskeletal: Normal range of motion. She exhibits no edema or tenderness.  Lymphadenopathy:    She has no cervical adenopathy.  Neurological: She is alert and oriented to person, place, and time. She has normal reflexes.  Skin: Skin is warm and dry. No rash noted.  Psychiatric: She has a normal mood and affect. Her behavior is normal. Judgment and thought content normal.    Lab Results  Component Value Date   HGBA1C 9.3 01/30/2016   HGBA1C 8.6 09/23/2015   HGBA1C 7.8 06/21/2015    Lab Results  Component Value Date   WBC 5.5 06/21/2015   HGB 13.3 03/18/2015   HCT 37.7 06/21/2015   PLT 242 06/21/2015   GLUCOSE 114 (H) 12/21/2015   CHOL 172 12/21/2015   TRIG 227 (H) 12/21/2015   HDL 32 (L) 12/21/2015   LDLCALC 95 12/21/2015   ALT 32 12/21/2015  AST 36 12/21/2015   NA 142 12/21/2015   K 4.3 12/21/2015   CL 99 12/21/2015   CREATININE 0.77 12/21/2015   BUN 12 12/21/2015   CO2 23 12/21/2015   TSH 5.010 (H) 12/21/2015   HGBA1C 9.3 01/30/2016    Mm Digital Screening Bilateral  Result Date: 06/08/2015 CLINICAL DATA:  Screening. EXAM: DIGITAL SCREENING BILATERAL MAMMOGRAM WITH CAD COMPARISON:  Previous exam(s). ACR Breast Density Category b: There are scattered areas of fibroglandular density. FINDINGS: There are no findings suspicious for malignancy. Images were processed with CAD. IMPRESSION: No mammographic evidence of malignancy. A result letter of this screening mammogram will be mailed directly to the  patient. RECOMMENDATION: Screening mammogram in one year. (Code:SM-B-01Y) BI-RADS CATEGORY  1: Negative. Electronically Signed   By: Lillia Mountain M.D.   On: 06/08/2015 14:12    Assessment & Plan:   Sesen was seen today for annual exam.  Diagnoses and all orders for this visit:  Hypothyroidism, unspecified hypothyroidism type -     TSH + free T4 -     VITAMIN D 25 Hydroxy (Vit-D Deficiency, Fractures)  Screening -     Pap IG w/ reflex to HPV when ASC-U  Mixed diabetic hyperlipidemia associated with type 2 diabetes mellitus ([place])  Long term current use of insulin ([place])  Other specified hypothyroidism  Polyposis of colon  Essential hypertension  Visit for routine gyn exam  Well adult  Other orders -     Dulaglutide (TRULICITY) 1.5 0000000 SOPN; One dose weekly   I have discontinued Ms. Lanum's JANUVIA. I am also having her start on Dulaglutide. Additionally, I am having her maintain her aspirin EC, Vitamin D, Insulin Pen Needle, BLOOD GLUCOSE TEST STRIPS, metFORMIN, ezetimibe, lisinopril, Insulin Detemir, levothyroxine, and Choline Fenofibrate.  Meds ordered this encounter  Medications  . Dulaglutide (TRULICITY) 1.5 0000000 SOPN    Sig: One dose weekly    Dispense:  4 pen    Refill:  11   Pt. Instructed on proper admin of Trulicity. Sample of starter dose 0.75, admin under supervision of nurse today  Follow-up: Return in about 3 months (around 06/23/2016) for diabetes, Hypothyroidism.  Claretta Fraise, M.D.

## 2016-03-24 LAB — VITAMIN D 25 HYDROXY (VIT D DEFICIENCY, FRACTURES): VIT D 25 HYDROXY: 21.3 ng/mL — AB (ref 30.0–100.0)

## 2016-03-24 LAB — TSH+FREE T4
Free T4: 1.86 ng/dL — ABNORMAL HIGH (ref 0.82–1.77)
TSH: 1.16 u[IU]/mL (ref 0.450–4.500)

## 2016-03-27 ENCOUNTER — Other Ambulatory Visit: Payer: Self-pay | Admitting: Family Medicine

## 2016-03-27 LAB — PAP IG W/ RFLX HPV ASCU: PAP SMEAR COMMENT: 0

## 2016-03-27 MED ORDER — VITAMIN D (ERGOCALCIFEROL) 1.25 MG (50000 UNIT) PO CAPS: 50000.0000 [IU] | ORAL_CAPSULE | ORAL | 0 refills | Status: DC

## 2016-03-27 MED FILL — VIT D2 1.25 MG (50,000 UNIT: 1.25 MG | 60 days supply | Qty: 16 | Fill #0

## 2016-04-02 NOTE — Progress Notes (Signed)
Your recent Pap smear performed in the office came back normal.

## 2016-04-06 MED FILL — FENOFIBRIC ACID DR 135 MG C: 135 | 90 days supply | Qty: 90 | Fill #1

## 2016-04-06 MED FILL — LISINOPRIL 5 MG TABLET: 5 | 90 days supply | Qty: 90 | Fill #1

## 2016-04-06 MED FILL — EZETIMIBE 10 MG TABLET: 10 | 90 days supply | Qty: 90 | Fill #2

## 2016-04-16 MED FILL — LEVEMIR FLEXTOUCH 100 UNITS: 100 | 26 days supply | Qty: 30 | Fill #4

## 2016-04-25 ENCOUNTER — Other Ambulatory Visit: Payer: Self-pay | Admitting: *Deleted

## 2016-04-25 MED ORDER — METFORMIN HCL 1000 MG PO TABS: ORAL_TABLET | ORAL | 1 refills | Status: DC

## 2016-04-25 MED FILL — LEVOTHYROXINE 125 MCG TAB: 125 | 90 days supply | Qty: 90 | Fill #1

## 2016-04-25 MED FILL — metFORMIN HCL 1000 MG TABS: 1000 | 90 days supply | Qty: 180 | Fill #0

## 2016-04-27 MED FILL — TRULICITY 1.5 MG/0.5 ML PEN: 1.5 | 28 days supply | Qty: 2 | Fill #1

## 2016-05-10 MED FILL — LEVEMIR FLEXTOUCH 100 UNITS: 100 | 26 days supply | Qty: 30 | Fill #5

## 2016-05-14 ENCOUNTER — Ambulatory Visit: Payer: Self-pay

## 2016-05-29 MED FILL — TRULICITY 1.5 MG/0.5 ML PEN: 1.5 | 28 days supply | Qty: 2 | Fill #2

## 2016-06-04 ENCOUNTER — Other Ambulatory Visit: Payer: Self-pay | Admitting: Family Medicine

## 2016-06-04 ENCOUNTER — Telehealth: Payer: Self-pay | Admitting: Family Medicine

## 2016-06-04 DIAGNOSIS — E1169 Type 2 diabetes mellitus with other specified complication: Secondary | ICD-10-CM

## 2016-06-04 DIAGNOSIS — E782 Mixed hyperlipidemia: Principal | ICD-10-CM

## 2016-06-04 DIAGNOSIS — Z1231 Encounter for screening mammogram for malignant neoplasm of breast: Secondary | ICD-10-CM

## 2016-06-04 NOTE — Telephone Encounter (Signed)
Okay at current level for 6 mos. Thanks ws 

## 2016-06-05 ENCOUNTER — Other Ambulatory Visit: Payer: Self-pay | Admitting: *Deleted

## 2016-06-05 DIAGNOSIS — E782 Mixed hyperlipidemia: Principal | ICD-10-CM

## 2016-06-05 DIAGNOSIS — E1169 Type 2 diabetes mellitus with other specified complication: Secondary | ICD-10-CM

## 2016-06-05 MED ORDER — INSULIN DETEMIR 100 UNIT/ML FLEXPEN: PEN_INJECTOR | SUBCUTANEOUS | 3 refills | Status: DC

## 2016-06-05 MED FILL — LEVEMIR FLEXTOUCH 100 UNITS: 100 | 27 days supply | Qty: 30 | Fill #0

## 2016-06-07 ENCOUNTER — Other Ambulatory Visit: Payer: Self-pay | Admitting: Family Medicine

## 2016-06-07 MED FILL — NOVOFINE 30 NEEDLES: 30G X 8 MM | 90 days supply | Qty: 100 | Fill #0

## 2016-06-11 ENCOUNTER — Other Ambulatory Visit: Payer: Self-pay

## 2016-06-11 VITALS — BP 138/84 | HR 70 | Resp 14 | Ht 59.0 in | Wt 168.0 lb

## 2016-06-11 DIAGNOSIS — E119 Type 2 diabetes mellitus without complications: Secondary | ICD-10-CM

## 2016-06-11 DIAGNOSIS — Z794 Long term (current) use of insulin: Principal | ICD-10-CM

## 2016-06-11 NOTE — Patient Outreach (Signed)
[facility]) Care Management   06/11/2016  Morgan Schmidt 1951-03-08 [id number]  Morgan Schmidt is an 65 y.o. female.   Member seen for follow up office visit for Link to Wellness program for self management of Type 2 diabetes  Subjective: Member states that her doctor started her on Trulicity when she saw him in August.  States that she is to go back to see him on 06/25/16.  States that her blood sugars have been better since she started taking it.  States she did not have any nausea or other side effects with the medication.  States that she has been trying to eat better and she is eating less potatoes at dinner now.  States she is to have her dental check up this week.  States she is trying to walk at least 2 times a week now that the weather is better and she is using her Total gym machine or Fit Board at home a few times a week.  Objective:   Review of Systems  All other systems reviewed and are negative.  Reviewed glucometer 7 day average-117 14 day average-124 30 day average-122 Physical Exam Today's Vitals   06/11/16 0834 06/11/16 0845  BP: 138/84   Pulse: 70   Resp: 14   SpO2: 98%   Weight: 168 lb (76.2 kg)   Height: 1.499 m (4\' 11" )   PainSc: 0-No pain 0-No pain   Encounter Medications:   Outpatient Encounter Prescriptions as of 06/11/2016  Medication Sig  . aspirin EC 81 MG tablet Take 81 mg by mouth daily.  . Cholecalciferol (VITAMIN D) 2000 UNITS tablet Take 1 tablet (2,000 Units total) by mouth daily.  . Choline Fenofibrate 135 MG capsule Take 1 capsule (135 mg total) by mouth daily.  . Dulaglutide (TRULICITY) 1.5 0000000 SOPN One dose weekly  . ezetimibe (ZETIA) 10 MG tablet Take 1 tablet (10 mg total) by mouth daily. For cholesterol  . Glucose Blood (BLOOD GLUCOSE TEST STRIPS) STRP 1 Units by Other route 2 (two) times daily.  . Insulin Detemir (LEVEMIR FLEXTOUCH) 100 UNIT/ML Pen INJECT 110 UNITS INTO THE SKIN ONCE DAILY  . Insulin Pen  Needle (NOVOFINE) 30G X 8 MM MISC Injected insulin once daily  . levothyroxine (SYNTHROID, LEVOTHROID) 125 MCG tablet Take 1 tablet (125 mcg total) by mouth daily.  Marland Kitchen lisinopril (PRINIVIL,ZESTRIL) 5 MG tablet TAKE 1 TABLET BY MOUTH DAILY.  . metFORMIN (GLUCOPHAGE) 1000 MG tablet TAKE 1 TABLET BY MOUTH 2 TIMES DAILY WITH A MEAL.  Marland Kitchen Vitamin D, Ergocalciferol, (DRISDOL) 50000 units CAPS capsule Take 1 capsule (50,000 Units total) by mouth 2 (two) times a week. (Patient not taking: Reported on 06/11/2016)   No facility-administered encounter medications on file as of 06/11/2016.     Functional Status:   In your present state of health, do you have any difficulty performing the following activities: 06/11/2016 03/23/2016  Hearing? N N  Vision? N N  Difficulty concentrating or making decisions? N N  Walking or climbing stairs? N N  Dressing or bathing? N N  Doing errands, shopping? N N  Some recent data might be hidden    Fall/Depression Screening:    PHQ 2/9 Scores 06/11/2016 03/23/2016 01/30/2016 12/23/2015 10/24/2015 09/23/2015 06/21/2015  PHQ - 2 Score 0 0 0 0 0 0 0    Assessment:  Member seen for follow up office visit for Link to Wellness program for self management of Type 2 diabetes. Member is not meeting self management  goal of hemoglobin A1C of 7% or below with last reading 9.3%.Member started on Trulicity when she saw provider in August. Member to see provider on 06/25/16 Reports improved blood sugars since starting new medication.  Denies any side effects from Trulicity.  Member reports adhering better to diet and eating less CHO a dinner Member reports walking 2 times a week and doing exercises at home 1-2 times a week. Member had annual eye exam and is having a dental check up on 06/14/16 .  Plan:  Check blood sugars 1 time a day before meals or 1 -2 hours after meals.  Goals of 80-130 before meals and less than 180 after meals.  Plan to eat 30-45 grams (2-3 servings) of carbohydrate a  meal and 15Grams for snacks.  Have protein with snacks Plan to walk 3 times a week for 4 laps around the track for 20-25 minutes.  Plan to try to work up to 150 minutes a week. Plan to use Fit Board  or [employer] twice a week  Plan to see Dr. Livia Snellen on 11/6 /17 Plan to enroll in the [facility] program Return to Link to Wellness on 09/10/16 at 8:30AM Plan to cancel if enrolled in [place] Problem One   Flowsheet Row Most Recent Value  Care Plan Problem One  Elevated blood sugars as evidenced by HgbA1C of 9.3% related to dx of Type 2 DM  Role Documenting the Problem One  Care Management [place] for Problem One  Active  THN Long Term Goal (31-90 days)  Member will decrease HgbA1C to  7.0 or below in the next 90 days  THN Long Term Goal Start Date  06/11/16 [Continue hemoglobin A1C last 9.3% to have rechecked 06/25/16]  Interventions for Problem One Long Term Goal  Instructed on use of Trulicity, Reviewed carbohydrate counting and portion control, Encouraged to eat more nonstarchy vegetables,   Reinforced importance of exercise to lower blood sugars,  Reviewed blood sugar goals and how they effect her hemoglobin A1C number, Given handout on the [facility] program and instructed on how to enroll in the program    Peter Garter RN, [facility] Care Management Coordinator-Link to [place] Management [phone]

## 2016-06-11 NOTE — Patient Instructions (Signed)
1. Check blood sugars 1 time a day before meals or 1 -2 hours after meals.  Goals of 80-130 before meals and less than 180 after meals.  2. Plan to eat 30-45 grams (2-3 servings) of carbohydrate a meal and 15Grams for snacks.  Have protein with snacks 3. Plan to walk 3 times a week for 4 laps around the track for 20-25 minutes.  Plan to try to work up to 150 minutes a week. Plan to use Fit Board  or [employer] twice a week  4. Plan to see Dr. Livia Snellen on 11/6 /17 5. Plan to enroll in the [facility] program 6. Return to Link to Wellness on 09/10/16 at 8:30AM Plan to cancel if enrolled in [facility]

## 2016-06-22 ENCOUNTER — Ambulatory Visit
Admission: RE | Admit: 2016-06-22 | Discharge: 2016-06-22 | Disposition: A | Discharge status: Home / Self Care | Payer: 59 | Source: Ambulatory Visit | Attending: Family Medicine | Admitting: Family Medicine

## 2016-06-22 DIAGNOSIS — Z1231 Encounter for screening mammogram for malignant neoplasm of breast: Secondary | ICD-10-CM | POA: Diagnosis not present

## 2016-06-25 ENCOUNTER — Ambulatory Visit (INDEPENDENT_AMBULATORY_CARE_PROVIDER_SITE_OTHER): Payer: 59 | Admitting: Family Medicine

## 2016-06-25 ENCOUNTER — Encounter: Payer: Self-pay | Admitting: Family Medicine

## 2016-06-25 ENCOUNTER — Ambulatory Visit (INDEPENDENT_AMBULATORY_CARE_PROVIDER_SITE_OTHER): Payer: 59

## 2016-06-25 VITALS — BP 132/70 | HR 74 | Temp 97.5°F | Ht 59.0 in | Wt 167.6 lb

## 2016-06-25 DIAGNOSIS — M79641 Pain in right hand: Secondary | ICD-10-CM | POA: Diagnosis not present

## 2016-06-25 DIAGNOSIS — E782 Mixed hyperlipidemia: Secondary | ICD-10-CM | POA: Diagnosis not present

## 2016-06-25 DIAGNOSIS — I10 Essential (primary) hypertension: Secondary | ICD-10-CM | POA: Diagnosis not present

## 2016-06-25 DIAGNOSIS — E1169 Type 2 diabetes mellitus with other specified complication: Secondary | ICD-10-CM | POA: Diagnosis not present

## 2016-06-25 DIAGNOSIS — E038 Other specified hypothyroidism: Secondary | ICD-10-CM

## 2016-06-25 LAB — BAYER DCA HB A1C WAIVED: HB A1C (BAYER DCA - WAIVED): 7.7 % — ABNORMAL HIGH (ref <7.0)

## 2016-06-25 MED ORDER — DICLOFENAC SODIUM 1 % TD GEL: 2.0000 g | Freq: Four times a day (QID) | TRANSDERMAL | 11 refills | Status: DC

## 2016-06-25 MED FILL — DICLOFENAC SODIUM 1% GEL: 1 | 25 days supply | Qty: 200 | Fill #0

## 2016-06-25 NOTE — Progress Notes (Signed)
Subjective:  [name], female    DOB: [birth year]  Age: 65 y.o. MRN: [id number]  CC: Diabetes (3 mos fu); Hypothyroidism; and Hypertension   HPI Morgan Schmidt presents for  follow-up of hypertension. Patient has no history of headache chest pain or shortness of breath or recent cough. Patient also denies symptoms of TIA such as numbness weakness lateralizing. Patient checks  blood pressure at home and has not had any elevated readings recently. Patient denies side effects from his medication. States taking it regularly.  Patient also  in for follow-up of elevated cholesterol. Doing well without complaints on current medication. Denies side effects of statin including myalgia and arthralgia and nausea. Also in today for liver function testing. Currently no chest pain, shortness of breath or other cardiovascular related symptoms noted.  Follow-up of diabetes. Patient does check blood sugar at home. Readings run between 120 or less fasting, and 180 or less unless eating out and exceeding diet - about once a month. [place] starting Trulicity.  Patient denies symptoms such as polyuria, polydipsia, excessive hunger, nausea No significant hypoglycemic spells noted. Medications as noted below. Taking them regularly without complication/adverse reaction being reported today.    History Morgan Schmidt has a past medical history of Arthritis; Cancer ([place]); Diabetes mellitus; and Thyroid disease.   Morgan Schmidt has a past surgical history that includes Thyroidectomy (2009) and Cesarean section.   Morgan Schmidt family history is not on file.Morgan Schmidt reports that Morgan Schmidt quit smoking about 15 years ago. Morgan Schmidt has never used smokeless tobacco. Morgan Schmidt reports that Morgan Schmidt does not drink alcohol or use drugs.  Current Outpatient Prescriptions on File Prior to Visit  Medication Sig Dispense Refill  . aspirin EC 81 MG tablet Take 81 mg by mouth daily.    . Cholecalciferol (VITAMIN D) 2000 UNITS tablet Take 1 tablet (2,000  Units total) by mouth daily. 30 tablet 11  . Choline Fenofibrate 135 MG capsule Take 1 capsule (135 mg total) by mouth daily. 30 capsule 5  . Dulaglutide (TRULICITY) 1.5 CL/2.7NT SOPN One dose weekly 4 pen 11  . ezetimibe (ZETIA) 10 MG tablet Take 1 tablet (10 mg total) by mouth daily. For cholesterol 90 tablet 3  . Glucose Blood (BLOOD GLUCOSE TEST STRIPS) STRP 1 Units by Other route 2 (two) times daily. 200 each 3  . Insulin Detemir (LEVEMIR FLEXTOUCH) 100 UNIT/ML Pen INJECT 110 UNITS INTO THE SKIN ONCE DAILY 15 mL 3  . Insulin Pen Needle (NOVOFINE) 30G X 8 MM MISC Injected insulin once daily 100 each 0  . levothyroxine (SYNTHROID, LEVOTHROID) 125 MCG tablet Take 1 tablet (125 mcg total) by mouth daily. 90 tablet 2  . lisinopril (PRINIVIL,ZESTRIL) 5 MG tablet TAKE 1 TABLET BY MOUTH DAILY. 90 tablet 1  . metFORMIN (GLUCOPHAGE) 1000 MG tablet TAKE 1 TABLET BY MOUTH 2 TIMES DAILY WITH A MEAL. 180 tablet 1  . Vitamin D, Ergocalciferol, (DRISDOL) 50000 units CAPS capsule Take 1 capsule (50,000 Units total) by mouth 2 (two) times a week. 16 capsule 0   No current facility-administered medications on file prior to visit.     ROS Review of Systems  Constitutional: Negative for activity change, appetite change and fever.  HENT: Negative for congestion, rhinorrhea and sore throat.   Eyes: Negative for visual disturbance.  Respiratory: Negative for cough and shortness of breath.   Cardiovascular: Negative for chest pain and palpitations.  Gastrointestinal: Negative for abdominal pain, diarrhea and nausea.  Genitourinary: Negative for dysuria.  Musculoskeletal: Positive  for arthralgias (knuckles of right hand, PIP & MCP, owrst at 3rd finger). Negative for myalgias.    Objective:  BP 132/70   Pulse 74   Temp 97.5 F (36.4 C) (Oral)   Ht 4' 11"  (1.499 m)   Wt 167 lb 9.6 oz (76 kg)   LMP 03/02/2004   BMI 33.85 kg/m   BP Readings from Last 3 Encounters:  06/25/16 132/70  06/11/16 138/84    03/23/16 118/73    Wt Readings from Last 3 Encounters:  06/25/16 167 lb 9.6 oz (76 kg)  06/11/16 168 lb (76.2 kg)  03/23/16 164 lb 11.2 oz (74.7 kg)     Physical Exam  Constitutional: Morgan Schmidt is oriented to person, place, and time. Morgan Schmidt appears well-developed and well-nourished. No distress.  HENT:  Head: Normocephalic and atraumatic.  Right Ear: External ear normal.  Left Ear: External ear normal.  Nose: Nose normal.  Mouth/Throat: Oropharynx is clear and moist.  Eyes: Conjunctivae and EOM are normal. Pupils are equal, round, and reactive to light.  Neck: Normal range of motion. Neck supple. No thyromegaly present.  Cardiovascular: Normal rate, regular rhythm and normal heart sounds.   No murmur heard. Pulmonary/Chest: Effort normal and breath sounds normal. No respiratory distress. Morgan Schmidt has no wheezes. Morgan Schmidt has no rales.  Abdominal: Soft. Bowel sounds are normal. Morgan Schmidt exhibits no distension. There is no tenderness.  Musculoskeletal: Normal range of motion. Morgan Schmidt exhibits tenderness (R MCPs - heberdeens nodes also).  Lymphadenopathy:    Morgan Schmidt has no cervical adenopathy.  Neurological: Morgan Schmidt is alert and oriented to person, place, and time. Morgan Schmidt has normal reflexes.  Skin: Skin is warm and dry.  Psychiatric: Morgan Schmidt has a normal mood and affect. Morgan Schmidt behavior is normal. Judgment and thought content normal.    Lab Results  Component Value Date   HGBA1C 9.3 01/30/2016   HGBA1C 8.6 09/23/2015   HGBA1C 7.8 06/21/2015    Lab Results  Component Value Date   WBC 5.5 06/21/2015   HGB 13.3 03/18/2015   HCT 37.7 06/21/2015   PLT 242 06/21/2015   GLUCOSE 114 (H) 12/21/2015   CHOL 172 12/21/2015   TRIG 227 (H) 12/21/2015   HDL 32 (L) 12/21/2015   LDLCALC 95 12/21/2015   ALT 32 12/21/2015   AST 36 12/21/2015   NA 142 12/21/2015   K 4.3 12/21/2015   CL 99 12/21/2015   CREATININE 0.77 12/21/2015   BUN 12 12/21/2015   CO2 23 12/21/2015   TSH 1.160 03/23/2016   HGBA1C 9.3 01/30/2016    MICROALBUR 20 05/06/2014    No results found.  Assessment & Plan:   Morgan Schmidt was seen today for diabetes, hypothyroidism and hypertension.  Diagnoses and all orders for this visit:  Hand pain, right -     DG Hand Complete Right; Future  Essential hypertension  Mixed diabetic hyperlipidemia associated with type 2 diabetes mellitus ([place]) -     Bayer DCA Hb A1c Waived -     CMP14+EGFR -     CBC with Differential/Platelet  Other specified hypothyroidism -     CBC with Differential/Platelet -     TSH + free T4  Other orders -     diclofenac sodium (VOLTAREN) 1 % GEL; Apply 2 g topically 4 (four) times daily. To affected joints at hands, fingers   I am having Morgan Schmidt start on diclofenac sodium. I am also having Morgan Schmidt maintain Morgan Schmidt aspirin EC, Vitamin D, BLOOD GLUCOSE TEST STRIPS, ezetimibe, lisinopril, levothyroxine, Choline  Fenofibrate, Dulaglutide, Vitamin D (Ergocalciferol), metFORMIN, Insulin Detemir, and Insulin Pen Needle.  Meds ordered this encounter  Medications  . diclofenac sodium (VOLTAREN) 1 % GEL    Sig: Apply 2 g topically 4 (four) times daily. To affected joints at hands, fingers    Dispense:  200 g    Refill:  11     Follow-up: Return in about 3 months (around 09/25/2016).  Claretta Fraise, M.D.

## 2016-06-26 LAB — TSH+FREE T4
Free T4: 1.91 ng/dL — ABNORMAL HIGH (ref 0.82–1.77)
TSH: 3.78 u[IU]/mL (ref 0.450–4.500)

## 2016-06-26 LAB — CBC WITH DIFFERENTIAL/PLATELET
BASOS: 1 %
Basophils Absolute: 0.1 10*3/uL (ref 0.0–0.2)
EOS (ABSOLUTE): 0.2 10*3/uL (ref 0.0–0.4)
Eos: 3 %
HEMOGLOBIN: 13.4 g/dL (ref 11.1–15.9)
Hematocrit: 38.6 % (ref 34.0–46.6)
IMMATURE GRANS (ABS): 0 10*3/uL (ref 0.0–0.1)
Immature Granulocytes: 0 %
LYMPHS ABS: 2.6 10*3/uL (ref 0.7–3.1)
LYMPHS: 37 %
MCH: 28.5 pg (ref 26.6–33.0)
MCHC: 34.7 g/dL (ref 31.5–35.7)
MCV: 82 fL (ref 79–97)
MONOCYTES: 5 %
Monocytes Absolute: 0.4 10*3/uL (ref 0.1–0.9)
NEUTROS ABS: 3.6 10*3/uL (ref 1.4–7.0)
Neutrophils: 54 %
Platelets: 231 10*3/uL (ref 150–379)
RBC: 4.71 x10E6/uL (ref 3.77–5.28)
RDW: 13.8 % (ref 12.3–15.4)
WBC: 6.9 10*3/uL (ref 3.4–10.8)

## 2016-06-26 LAB — CMP14+EGFR
A/G RATIO: 2.4 — AB (ref 1.2–2.2)
ALBUMIN: 4.8 g/dL (ref 3.6–4.8)
ALT: 33 IU/L — AB (ref 0–32)
AST: 32 IU/L (ref 0–40)
Alkaline Phosphatase: 63 IU/L (ref 39–117)
BILIRUBIN TOTAL: 0.8 mg/dL (ref 0.0–1.2)
BUN / CREAT RATIO: 15 (ref 12–28)
BUN: 13 mg/dL (ref 8–27)
CALCIUM: 9.2 mg/dL (ref 8.7–10.3)
CO2: 23 mmol/L (ref 18–29)
Chloride: 95 mmol/L — ABNORMAL LOW (ref 96–106)
Creatinine, Ser: 0.84 mg/dL (ref 0.57–1.00)
GFR, EST AFRICAN AMERICAN: 86 mL/min/{1.73_m2} (ref >59)
GFR, EST NON AFRICAN AMERICAN: 74 mL/min/{1.73_m2} (ref >59)
Globulin, Total: 2 g/dL (ref 1.5–4.5)
Glucose: 138 mg/dL — ABNORMAL HIGH (ref 65–99)
POTASSIUM: 4.3 mmol/L (ref 3.5–5.2)
Sodium: 137 mmol/L (ref 134–144)
TOTAL PROTEIN: 6.8 g/dL (ref 6.0–8.5)

## 2016-06-27 MED FILL — TRULICITY 1.5 MG/0.5 ML PEN: 1.5 | 28 days supply | Qty: 2 | Fill #3

## 2016-06-28 MED FILL — LEVEMIR FLEXTOUCH 100 UNITS: 100 | 27 days supply | Qty: 30 | Fill #1

## 2016-07-24 ENCOUNTER — Other Ambulatory Visit: Payer: Self-pay | Admitting: Family Medicine

## 2016-07-24 DIAGNOSIS — E1169 Type 2 diabetes mellitus with other specified complication: Secondary | ICD-10-CM

## 2016-07-24 DIAGNOSIS — E782 Mixed hyperlipidemia: Principal | ICD-10-CM

## 2016-07-24 MED FILL — LEVOTHYROXINE 125 MCG TABLE: 125 | 90 days supply | Qty: 90 | Fill #2

## 2016-07-24 MED FILL — TRULICITY 1.5 MG/0.5 ML PEN: 1.5 | 28 days supply | Qty: 2 | Fill #4

## 2016-07-26 MED FILL — LEVEMIR FLEXTOUCH 100 UNITS: 100 | 26 days supply | Qty: 30 | Fill #0

## 2016-08-23 MED FILL — TRULICITY 1.5 MG/0.5 ML PEN: 1.5 | 28 days supply | Qty: 2 | Fill #5

## 2016-08-29 MED FILL — FENOFIBRIC ACID DR 135 MG C: 135 | 60 days supply | Qty: 60 | Fill #2

## 2016-08-29 MED FILL — metFORMIN HCL 1000 MG TABS: 1000 | 90 days supply | Qty: 180 | Fill #1

## 2016-08-29 MED FILL — EZETIMIBE 10 MG TABLET: 10 | 90 days supply | Qty: 90 | Fill #3

## 2016-08-30 ENCOUNTER — Telehealth: Payer: Self-pay | Admitting: Family Medicine

## 2016-08-30 NOTE — Telephone Encounter (Signed)
Pt is aware that [facility] is out of the office today and returning tomorrow.

## 2016-08-31 ENCOUNTER — Other Ambulatory Visit: Payer: Self-pay | Admitting: Family Medicine

## 2016-08-31 MED ORDER — INSULIN GLARGINE 100 UNIT/ML SOLOSTAR PEN: 110.0000 [IU] | PEN_INJECTOR | Freq: Every day | SUBCUTANEOUS | 99 refills | Status: DC

## 2016-08-31 MED FILL — LANTUS SOLOSTAR 100 UNITS/M: 100 | 28 days supply | Qty: 30 | Fill #0

## 2016-08-31 NOTE — Telephone Encounter (Signed)
I sent in the requested prescription 

## 2016-08-31 NOTE — Telephone Encounter (Signed)
Left detailed message stating requested rx sent in and to call back with any further questions or concerns.

## 2016-08-31 NOTE — Telephone Encounter (Signed)
Left message for pt RX was sent into pharmacy

## 2016-09-10 ENCOUNTER — Ambulatory Visit: Payer: Self-pay

## 2016-09-13 ENCOUNTER — Other Ambulatory Visit: Payer: Self-pay | Admitting: Family Medicine

## 2016-09-14 MED FILL — NOVOFINE 30 NEEDLES: 30G X 8 MM | 90 days supply | Qty: 100 | Fill #0

## 2016-09-22 ENCOUNTER — Emergency Department (HOSPITAL_COMMUNITY)
Admission: EM | Admit: 2016-09-22 | Discharge: 2016-09-22 | Disposition: A | Discharge status: Home / Self Care | Payer: 59 | Attending: Emergency Medicine | Admitting: Emergency Medicine

## 2016-09-22 ENCOUNTER — Encounter (HOSPITAL_COMMUNITY): Payer: Self-pay | Admitting: Nurse Practitioner

## 2016-09-22 ENCOUNTER — Emergency Department (HOSPITAL_COMMUNITY): Payer: 59

## 2016-09-22 DIAGNOSIS — E039 Hypothyroidism, unspecified: Secondary | ICD-10-CM | POA: Diagnosis not present

## 2016-09-22 DIAGNOSIS — E119 Type 2 diabetes mellitus without complications: Secondary | ICD-10-CM | POA: Diagnosis not present

## 2016-09-22 DIAGNOSIS — Z794 Long term (current) use of insulin: Secondary | ICD-10-CM | POA: Insufficient documentation

## 2016-09-22 DIAGNOSIS — Z79899 Other long term (current) drug therapy: Secondary | ICD-10-CM | POA: Insufficient documentation

## 2016-09-22 DIAGNOSIS — M25462 Effusion, left knee: Secondary | ICD-10-CM | POA: Diagnosis not present

## 2016-09-22 DIAGNOSIS — M25562 Pain in left knee: Secondary | ICD-10-CM | POA: Diagnosis present

## 2016-09-22 DIAGNOSIS — Z87891 Personal history of nicotine dependence: Secondary | ICD-10-CM | POA: Diagnosis not present

## 2016-09-22 DIAGNOSIS — Y9301 Activity, walking, marching and hiking: Secondary | ICD-10-CM | POA: Insufficient documentation

## 2016-09-22 DIAGNOSIS — Z7982 Long term (current) use of aspirin: Secondary | ICD-10-CM | POA: Diagnosis not present

## 2016-09-22 DIAGNOSIS — I10 Essential (primary) hypertension: Secondary | ICD-10-CM | POA: Diagnosis not present

## 2016-09-22 DIAGNOSIS — Z85850 Personal history of malignant neoplasm of thyroid: Secondary | ICD-10-CM | POA: Diagnosis not present

## 2016-09-22 DIAGNOSIS — X501XXA Overexertion from prolonged static or awkward postures, initial encounter: Secondary | ICD-10-CM | POA: Diagnosis not present

## 2016-09-22 DIAGNOSIS — Y929 Unspecified place or not applicable: Secondary | ICD-10-CM | POA: Insufficient documentation

## 2016-09-22 DIAGNOSIS — Y990 Civilian activity done for income or pay: Secondary | ICD-10-CM | POA: Insufficient documentation

## 2016-09-22 MED ORDER — IBUPROFEN 400 MG PO TABS
600.0000 mg | ORAL_TABLET | Freq: Once | ORAL | Status: AC
  Administered 2016-09-22: 600 mg via ORAL
  Filled 2016-09-22: qty 1

## 2016-09-22 MED ORDER — DICLOFENAC SODIUM 50 MG PO TBEC: 50.0000 mg | DELAYED_RELEASE_TABLET | Freq: Two times a day (BID) | ORAL | 0 refills | Status: DC

## 2016-09-22 NOTE — Discharge Instructions (Signed)
Call Dr. Aurea Graff office on Monday to schedule follow up. Return here as needed for worsening symptoms.

## 2016-09-22 NOTE — ED Triage Notes (Signed)
Pt presents with c/o L knee pain. The pain began about a week ago. The pain would be worse at the end of the day after she worked her shift and she would also noticed some swelling. She has been wearing brace and applying ice throughout the week with some relief of pain Tonight as she was walking she felt a pop in the knee and now has severe pain since.

## 2016-09-22 NOTE — ED Provider Notes (Signed)
[facility] Provider Note    By signing my name below, I, Bea Graff, attest that this documentation has been prepared under the direction and in the presence of [facility], [place]. Electronically Signed: Bea Graff, ED Scribe. 09/22/16. 8:58 PM.  8:33 PM- Attempted to see patient but she had not been brought back to room yet.  History   Chief Complaint Chief Complaint  Patient presents with  . Knee Pain    The history is provided by the patient and medical records. No language interpreter was used.    Morgan Schmidt is a 66 y.o. female with PMHx of arthritis, thyroid cancer and DM who presents to the Emergency Department complaining of worsening left knee pain that began about 1.5 weeks ago. She reports associated swelling. Pt states she was coming into work and felt a popping sensation and her left leg gave in. She has not been weight bearing since that incident. 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 She has been icing the left knee and wearing a knee sleeve with minimal relief. Standing and bearing weight increases the pain.  She denies alleviating factors. She denies bruising, wounds, numbness, tingling or weakness of the LLE. She denies any known allergies to any medications. She denies h/o HTN.   Past Medical History:  Diagnosis Date  . Arthritis   . Cancer ([place])    thyroid CA  . Diabetes mellitus   . Thyroid disease     Patient Active Problem List   Diagnosis Date Noted  . Essential hypertension 03/23/2016  . Other specified hypothyroidism 12/16/2014  . Mixed diabetic hyperlipidemia associated with type 2 diabetes mellitus ([place]) 12/16/2014  . Long term current use of insulin ([place]) 12/16/2014  . Polyposis of colon 12/16/2014  . History of thyroid cancer 08/24/2011    Past Surgical History:  Procedure Laterality Date  . CESAREAN SECTION    . THYROIDECTOMY  2009    OB History    No data  available       Home Medications    Prior to Admission medications   Medication Sig Start Date End Date Taking? Authorizing Provider  aspirin EC 81 MG tablet Take 81 mg by mouth daily.    Historical Provider, MD  Cholecalciferol (VITAMIN D) 2000 UNITS tablet Take 1 tablet (2,000 Units total) by mouth daily. 03/18/15   Claretta Fraise, MD  Choline Fenofibrate 135 MG capsule Take 1 capsule (135 mg total) by mouth daily. 01/03/16   Claretta Fraise, MD  diclofenac (VOLTAREN) 50 MG EC tablet Take 1 tablet (50 mg total) by mouth 2 (two) times daily. 09/22/16   Manuelita Moxon Bunnie Pion, NP  diclofenac sodium (VOLTAREN) 1 % GEL Apply 2 g topically 4 (four) times daily. To affected joints at hands, fingers 06/25/16   Claretta Fraise, MD  Dulaglutide (TRULICITY) 1.5 0000000 SOPN One dose weekly 03/23/16   Claretta Fraise, MD  ezetimibe (ZETIA) 10 MG tablet Take 1 tablet (10 mg total) by mouth daily. For cholesterol 10/07/15   Claretta Fraise, MD  Glucose Blood (BLOOD GLUCOSE TEST STRIPS) STRP 1 Units by Other route 2 (two) times daily. 03/18/15   Claretta Fraise, MD  Insulin Glargine (LANTUS SOLOSTAR) 100 UNIT/ML Solostar Pen Inject 110 Units into the skin daily. 08/31/16   Claretta Fraise, MD  LEVEMIR FLEXTOUCH 100 UNIT/ML Pen INJECT 110 UNITS ONCE DAILY 07/25/16   Claretta Fraise, MD  levothyroxine (SYNTHROID, LEVOTHROID) 125 MCG tablet  Take 1 tablet (125 mcg total) by mouth daily. 12/26/15   Claretta Fraise, MD  lisinopril (PRINIVIL,ZESTRIL) 5 MG tablet TAKE 1 TABLET BY MOUTH DAILY. 11/29/15   Claretta Fraise, MD  metFORMIN (GLUCOPHAGE) 1000 MG tablet TAKE 1 TABLET BY MOUTH 2 TIMES DAILY WITH A MEAL. 04/25/16   Claretta Fraise, MD  NOVOFINE 30G X 8 MM MISC USE AS DIRECTED ONCE DAILY 09/14/16   Claretta Fraise, MD  Vitamin D, Ergocalciferol, (DRISDOL) 50000 units CAPS capsule Take 1 capsule (50,000 Units total) by mouth 2 (two) times a week. 03/29/16   Claretta Fraise, MD    Family History Family History  Problem Relation Age of Onset  . Colon  cancer Neg Hx   . Esophageal cancer Neg Hx   . Rectal cancer Neg Hx   . Stomach cancer Neg Hx     Social History Social History  Substance Use Topics  . Smoking status: Former Smoker    Quit date: 08/23/2000  . Smokeless tobacco: Never Used  . Alcohol use No     Allergies   Patient has no known allergies.   Review of Systems Review of Systems  Constitutional: Negative for fatigue.  Respiratory: Negative for cough.   Gastrointestinal: Negative for nausea and vomiting.  Musculoskeletal: Positive for arthralgias and joint swelling. Negative for back pain, gait problem, myalgias, neck pain and neck stiffness.       Knee pain  Skin: Negative for color change and wound.  Neurological: Negative for weakness.     Physical Exam Updated Vital Signs BP 160/89 (BP Location: Left Arm)   Pulse 80   Temp 97.8 F (36.6 C) (Oral)   Resp 17   Ht 4\' 11"  (1.499 m)   Wt 165 lb (74.8 kg)   LMP 03/02/2004   SpO2 97%   BMI 33.33 kg/m   Physical Exam  Constitutional: She is oriented to person, place, and time. She appears well-developed and well-nourished. No distress.  HENT:  Head: Normocephalic.  Eyes: EOM are normal.  Neck: Neck supple.  Cardiovascular: Normal rate.   Dorsalis Pedis pulses 2+ bilaterally.  Pulmonary/Chest: Effort normal.  Musculoskeletal: She exhibits tenderness. She exhibits no deformity.       Left knee: She exhibits swelling and effusion. She exhibits no ecchymosis, no erythema and normal alignment. Decreased range of motion: due to pain. Tenderness found. MCL tenderness noted.  Tenderness over MCL with any ROM. No high riding patella. No abnormal patellar movement. Effusion to anterior aspect of left knee. Pedal pulses 2+.  Neurological: She is alert and oriented to person, place, and time. No cranial nerve deficit.  Skin: Skin is warm and dry.  Psychiatric: She has a normal mood and affect. Her behavior is normal.  Nursing note and vitals reviewed.    ED  Treatments / Results  DIAGNOSTIC STUDIES: Oxygen Saturation is 97% on RA, normal by my interpretation.   COORDINATION OF CARE: 8:54 PM- Informed pt that her X-Ray was negative for fracture or dislocation but showed a joint effusion. Will provide knee immobilizer and crutches. Will give referral to orthopedist. Will provide work note and prescribe pain medication. Pt verbalizes understanding and agrees to plan.  Medications  ibuprofen (ADVIL,MOTRIN) tablet 600 mg (600 mg Oral Given 09/22/16 2102)    Radiology Dg Knee Complete 4 Views Left  Result Date: 09/22/2016 CLINICAL DATA:  Left knee pain x 2 wks, pt states she was walking into work today and heard her knee pop, knee gave way at that  time almost causing her to fall. No known injury. EXAM: LEFT KNEE - COMPLETE 4+ VIEW COMPARISON:  None. FINDINGS: There is no acute fracture. Joint effusion is present. Mild narrowing of the medial compartment. IMPRESSION: Joint effusion.  No acute fracture. Electronically Signed   By: Nolon Nations M.D.   On: 09/22/2016 20:19    Procedures Procedures (including critical care time)  Medications Ordered in ED Medications  ibuprofen (ADVIL,MOTRIN) tablet 600 mg (600 mg Oral Given 09/22/16 2102)     Initial Impression / Assessment and Plan / ED Course  I have reviewed the triage vital signs and the nursing notes.  Pertinent imaging results that were available during my care of the patient were reviewed by me and considered in my medical decision making (see chart for details).     Patient X-Ray negative for obvious fracture or dislocation. Joint effusion present. Pt advised to follow up with orthopedics. Patient given knee immobilizer while in ED, conservative therapy recommended and discussed. Patient will be discharged home & is agreeable with above plan. Returns precautions discussed. Pt appears safe for discharge.   Final Clinical Impressions(s) / ED Diagnoses   Final diagnoses:  Effusion of  left knee    New Prescriptions Discharge Medication List as of 09/22/2016  9:01 PM    START taking these medications   Details  diclofenac (VOLTAREN) 50 MG EC tablet Take 1 tablet (50 mg total) by mouth 2 (two) times daily., Starting Sat 09/22/2016, [place], [place] 09/23/16 [id number]    Quintella Reichert, MD 09/23/16 1246

## 2016-09-22 NOTE — ED Notes (Signed)
Pt verbalized understanding discharge instructions and denies any further needs or questions at this time. VS stable, ambulatory and steady gait.   

## 2016-09-22 NOTE — Progress Notes (Signed)
Orthopedic Tech Progress Note Patient Details:  Morgan Schmidt [birth year] [id number]  Ortho Devices Type of Ortho Device: Crutches, Knee Immobilizer Ortho Device/Splint Location: LLE Ortho Device/Splint Interventions: Ordered, Application   Braulio Bosch 09/22/2016, 9:19 PM

## 2016-09-24 DIAGNOSIS — M25562 Pain in left knee: Secondary | ICD-10-CM | POA: Diagnosis not present

## 2016-09-24 MED FILL — TRULICITY 1.5 MG/0.5 ML PEN: 1.5 | 28 days supply | Qty: 2 | Fill #6

## 2016-09-24 MED FILL — LANTUS SOLOSTAR 100 UNITS/M: 100 | 28 days supply | Qty: 30 | Fill #1

## 2016-09-25 ENCOUNTER — Ambulatory Visit: Payer: 59 | Admitting: Family Medicine

## 2016-10-02 DIAGNOSIS — M25562 Pain in left knee: Secondary | ICD-10-CM | POA: Diagnosis not present

## 2016-10-10 DIAGNOSIS — M25562 Pain in left knee: Secondary | ICD-10-CM | POA: Diagnosis not present

## 2016-10-10 DIAGNOSIS — S83242A Other tear of medial meniscus, current injury, left knee, initial encounter: Secondary | ICD-10-CM | POA: Diagnosis not present

## 2016-10-17 ENCOUNTER — Encounter (HOSPITAL_COMMUNITY): Payer: Self-pay

## 2016-10-17 NOTE — Patient Instructions (Addendum)
Morgan Schmidt  10/17/2016   Your procedure is scheduled on: 10-22-16  Report to [facility]  Entrance take [facility]  elevators to 3rd floor to  [place] at 1230 PM  Call this number if you have problems the morning of surgery [phone]   Remember: ONLY 1 PERSON MAY GO WITH YOU TO SHORT STAY TO GET  READY MORNING OF YOUR SURGERY.  Do not eat food  :After Midnight.MAY HAVE CLEAR LIQIUIDS FROM MIDNIGHT UNTIL 830 AM, THEN NOTHING BY MOUTH AFTER 830 AM DAY OF SURGERY.     Take these medicines the morning of surgery with A SIP OF WATER: LEVOTHYROXINE (SYNTHROID)                                You may not have any metal on your body including hair pins and              piercings  Do not wear jewelry, make-up, lotions, powders or perfumes, deodorant             Do not wear nail polish.  Do not shave  48 hours prior to surgery.              Men may shave face and neck.   Do not bring valuables to the hospital. [place].  Contacts, dentures or bridgework may not be worn into surgery.  Leave suitcase in the car. After surgery it may be brought to your room.     Patients discharged the day of surgery will not be allowed to drive home.  Name and phone number of your driver:  Special Instructions: N/A              Please read over the following fact sheets you were given: _____________________________________________________________________  How to Manage Your Diabetes Before and After Surgery  Why is it important to control my blood sugar before and after surgery? . Improving blood sugar levels before and after surgery helps healing and can limit problems. . A way of improving blood sugar control is eating a healthy diet by: o  Eating less sugar and carbohydrates o  Increasing activity/exercise o  Talking with your doctor about reaching your blood sugar goals . High blood sugars (greater than 180  mg/dL) can raise your risk of infections and slow your recovery, so you will need to focus on controlling your diabetes during the weeks before surgery. . Make sure that the doctor who takes care of your diabetes knows about your planned surgery including the date and location.  How do I manage my blood sugar before surgery? . Check your blood sugar at least 4 times a day, starting 2 days before surgery, to make sure that the level is not too high or low. o Check your blood sugar the morning of your surgery when you wake up and every 2 hours until you get to the Short Stay unit. . If your blood sugar is less than 70 mg/dL, you will need to treat for low blood sugar: o Do not take insulin. o Treat a low blood sugar (less than 70 mg/dL) with  cup of clear juice (cranberry or apple), 4 glucose tablets, OR glucose gel. o Recheck  blood sugar in 15 minutes after treatment (to make sure it is greater than 70 mg/dL). If your blood sugar is not greater than 70 mg/dL on recheck, call [phone] for further instructions. . Report your blood sugar to the short stay nurse when you get to Short Stay.  . If you are admitted to the hospital after surgery: o Your blood sugar will be checked by the staff and you will probably be given insulin after surgery (instead of oral diabetes medicines) to make sure you have good blood sugar levels. o The goal for blood sugar control after surgery is 80-180 mg/dL.   WHAT DO I DO ABOUT MY DIABETES MEDICATION? TAKE TRULICITY AS NORMAL ON Friday  TAKE METFORMIN AS USUAL DAY BEFORE SURGERY 10-21-16, DO NOT TAKE METFORMIN  DAY OF SURGERY 10-22-16  TAKE 1/2 DOSE OF LANTUS INSULIN  ( 52 UNITS)  AT BEDTIME NIGHT BEFORE SURGERY 10-21-16    Patient Signature:  Date:   Nurse Signature:  Date:   Reviewed and Endorsed by [place] Patient Education Committee, August 2015              CLEAR LIQUID DIET   Foods Allowed                                                                      Foods Excluded  Coffee and tea, regular and decaf                             liquids that you cannot  Plain Jell-O in any flavor                                             see through such as: Fruit ices (not with fruit pulp)                                     milk, soups, orange juice  Iced Popsicles                                    All solid food Carbonated beverages, regular and diet                                    Cranberry, grape and apple juices Sports drinks like Gatorade Lightly seasoned clear broth or consume(fat free) Sugar, honey syrup  Sample Menu Breakfast                                Lunch                                     Supper Cranberry juice  Beef broth                            Chicken broth Jell-O                                     Grape juice                           Apple juice Coffee or tea                        Jell-O                                      Popsicle                                                Coffee or tea                        Coffee or tea  _____________________________________________________________________  [facility] - Preparing for Surgery Before surgery, you can play an important role.  Because skin is not sterile, your skin needs to be as free of germs as possible.  You can reduce the number of germs on your skin by washing with CHG (chlorahexidine gluconate) soap before surgery.  CHG is an antiseptic cleaner which kills germs and bonds with the skin to continue killing germs even after washing. Please DO NOT use if you have an allergy to CHG or antibacterial soaps.  If your skin becomes reddened/irritated stop using the CHG and inform your nurse when you arrive at Short Stay. Do not shave (including legs and underarms) for at least 48 hours prior to the first CHG shower.  You may shave your face/neck. Please follow these instructions carefully:  1.  Shower with CHG Soap the night before  surgery and the  morning of Surgery.  2.  If you choose to wash your hair, wash your hair first as usual with your  normal  shampoo.  3.  After you shampoo, rinse your hair and body thoroughly to remove the  shampoo.                           4.  Use CHG as you would any other liquid soap.  You can apply chg directly  to the skin and wash                       Gently with a scrungie or clean washcloth.  5.  Apply the CHG Soap to your body ONLY FROM THE NECK DOWN.   Do not use on face/ open                           Wound or open sores. Avoid contact with eyes, ears mouth and genitals (private parts).                       Wash face,  Genitals (private parts) with your normal soap.             6.  Wash thoroughly, paying special attention to the area where your surgery  will be performed.  7.  Thoroughly rinse your body with warm water from the neck down.  8.  DO NOT shower/wash with your normal soap after using and rinsing off  the CHG Soap.                9.  Pat yourself dry with a clean towel.            10.  Wear clean pajamas.            11.  Place clean sheets on your bed the night of your first shower and do not  sleep with pets. Day of Surgery : Do not apply any lotions/deodorants the morning of surgery.  Please wear clean clothes to the hospital/surgery center.

## 2016-10-18 ENCOUNTER — Encounter (HOSPITAL_COMMUNITY)
Admission: RE | Admit: 2016-10-18 | Discharge: 2016-10-18 | Disposition: A | Discharge status: Home / Self Care | Payer: 59 | Source: Ambulatory Visit | Attending: Orthopedic Surgery | Admitting: Orthopedic Surgery

## 2016-10-18 ENCOUNTER — Encounter (HOSPITAL_COMMUNITY): Payer: Self-pay

## 2016-10-18 DIAGNOSIS — Z01810 Encounter for preprocedural cardiovascular examination: Secondary | ICD-10-CM | POA: Diagnosis not present

## 2016-10-18 DIAGNOSIS — Z01812 Encounter for preprocedural laboratory examination: Secondary | ICD-10-CM | POA: Insufficient documentation

## 2016-10-18 DIAGNOSIS — M94262 Chondromalacia, left knee: Secondary | ICD-10-CM | POA: Insufficient documentation

## 2016-10-18 DIAGNOSIS — M23204 Derangement of unspecified medial meniscus due to old tear or injury, left knee: Secondary | ICD-10-CM | POA: Diagnosis not present

## 2016-10-18 DIAGNOSIS — E119 Type 2 diabetes mellitus without complications: Secondary | ICD-10-CM | POA: Diagnosis not present

## 2016-10-18 DIAGNOSIS — I4510 Unspecified right bundle-branch block: Secondary | ICD-10-CM | POA: Diagnosis not present

## 2016-10-18 LAB — BASIC METABOLIC PANEL
ANION GAP: 8 (ref 5–15)
BUN: 9 mg/dL (ref 6–20)
CHLORIDE: 102 mmol/L (ref 101–111)
CO2: 30 mmol/L (ref 22–32)
Calcium: 8.8 mg/dL — ABNORMAL LOW (ref 8.9–10.3)
Creatinine, Ser: 0.71 mg/dL (ref 0.44–1.00)
GFR calc non Af Amer: >60 mL/min (ref >60)
Glucose, Bld: 182 mg/dL — ABNORMAL HIGH (ref 65–99)
Potassium: 4 mmol/L (ref 3.5–5.1)
Sodium: 140 mmol/L (ref 135–145)

## 2016-10-18 LAB — GLUCOSE, CAPILLARY: GLUCOSE-CAPILLARY: 189 mg/dL — AB (ref 65–99)

## 2016-10-18 LAB — CBC
HCT: 41.7 % (ref 36.0–46.0)
HEMOGLOBIN: 13.8 g/dL (ref 12.0–15.0)
MCH: 27.5 pg (ref 26.0–34.0)
MCHC: 33.1 g/dL (ref 30.0–36.0)
MCV: 83.1 fL (ref 78.0–100.0)
Platelets: 223 10*3/uL (ref 150–400)
RBC: 5.02 MIL/uL (ref 3.87–5.11)
RDW: 13.6 % (ref 11.5–15.5)
WBC: 6.9 10*3/uL (ref 4.0–10.5)

## 2016-10-19 LAB — HEMOGLOBIN A1C
HEMOGLOBIN A1C: 7.6 % — AB (ref 4.8–5.6)
Mean Plasma Glucose: 171 mg/dL

## 2016-10-22 ENCOUNTER — Ambulatory Visit (HOSPITAL_BASED_OUTPATIENT_CLINIC_OR_DEPARTMENT_OTHER): Payer: 59 | Admitting: Certified Registered"

## 2016-10-22 ENCOUNTER — Ambulatory Visit (HOSPITAL_COMMUNITY)
Admission: RE | Admit: 2016-10-22 | Discharge: 2016-10-22 | Disposition: A | Discharge status: Home / Self Care | Payer: 59 | Source: Ambulatory Visit | Attending: Orthopedic Surgery | Admitting: Orthopedic Surgery

## 2016-10-22 ENCOUNTER — Encounter (HOSPITAL_BASED_OUTPATIENT_CLINIC_OR_DEPARTMENT_OTHER)
Admission: RE | Disposition: A | Discharge status: Home / Self Care | Payer: Self-pay | Source: Ambulatory Visit | Attending: Orthopedic Surgery

## 2016-10-22 ENCOUNTER — Encounter (HOSPITAL_BASED_OUTPATIENT_CLINIC_OR_DEPARTMENT_OTHER): Payer: Self-pay | Admitting: Anesthesiology

## 2016-10-22 ENCOUNTER — Other Ambulatory Visit: Payer: Self-pay | Admitting: Family Medicine

## 2016-10-22 DIAGNOSIS — Z85850 Personal history of malignant neoplasm of thyroid: Secondary | ICD-10-CM | POA: Insufficient documentation

## 2016-10-22 DIAGNOSIS — Z87898 Personal history of other specified conditions: Secondary | ICD-10-CM | POA: Insufficient documentation

## 2016-10-22 DIAGNOSIS — I10 Essential (primary) hypertension: Secondary | ICD-10-CM | POA: Insufficient documentation

## 2016-10-22 DIAGNOSIS — Z794 Long term (current) use of insulin: Secondary | ICD-10-CM | POA: Insufficient documentation

## 2016-10-22 DIAGNOSIS — E119 Type 2 diabetes mellitus without complications: Secondary | ICD-10-CM | POA: Insufficient documentation

## 2016-10-22 DIAGNOSIS — R9431 Abnormal electrocardiogram [ECG] [EKG]: Secondary | ICD-10-CM | POA: Insufficient documentation

## 2016-10-22 DIAGNOSIS — Z7982 Long term (current) use of aspirin: Secondary | ICD-10-CM | POA: Diagnosis not present

## 2016-10-22 DIAGNOSIS — Z87891 Personal history of nicotine dependence: Secondary | ICD-10-CM | POA: Insufficient documentation

## 2016-10-22 DIAGNOSIS — X501XXA Overexertion from prolonged static or awkward postures, initial encounter: Secondary | ICD-10-CM | POA: Diagnosis not present

## 2016-10-22 DIAGNOSIS — Z79899 Other long term (current) drug therapy: Secondary | ICD-10-CM | POA: Insufficient documentation

## 2016-10-22 DIAGNOSIS — I4510 Unspecified right bundle-branch block: Secondary | ICD-10-CM | POA: Insufficient documentation

## 2016-10-22 DIAGNOSIS — M94262 Chondromalacia, left knee: Secondary | ICD-10-CM | POA: Insufficient documentation

## 2016-10-22 DIAGNOSIS — S83242A Other tear of medial meniscus, current injury, left knee, initial encounter: Secondary | ICD-10-CM | POA: Insufficient documentation

## 2016-10-22 DIAGNOSIS — Y929 Unspecified place or not applicable: Secondary | ICD-10-CM | POA: Insufficient documentation

## 2016-10-22 DIAGNOSIS — E039 Hypothyroidism, unspecified: Secondary | ICD-10-CM

## 2016-10-22 DIAGNOSIS — Y939 Activity, unspecified: Secondary | ICD-10-CM | POA: Insufficient documentation

## 2016-10-22 DIAGNOSIS — E782 Mixed hyperlipidemia: Secondary | ICD-10-CM | POA: Diagnosis not present

## 2016-10-22 HISTORY — PX: KNEE ARTHROSCOPY WITH SUBCHONDROPLASTY: SHX6732

## 2016-10-22 HISTORY — PX: KNEE ARTHROSCOPY: SHX127

## 2016-10-22 HISTORY — PX: KNEE ARTHROSCOPY WITH MEDIAL MENISECTOMY: SHX5651

## 2016-10-22 LAB — POCT I-STAT 4, (NA,K, GLUC, HGB,HCT)
Glucose, Bld: 109 mg/dL — ABNORMAL HIGH (ref 65–99)
HCT: 41 % (ref 36.0–46.0)
HEMOGLOBIN: 13.9 g/dL (ref 12.0–15.0)
Potassium: 3.5 mmol/L (ref 3.5–5.1)
SODIUM: 140 mmol/L (ref 135–145)

## 2016-10-22 LAB — GLUCOSE, CAPILLARY: GLUCOSE-CAPILLARY: 93 mg/dL (ref 65–99)

## 2016-10-22 SURGERY — ARTHROSCOPY, KNEE
Anesthesia: General | Site: Knee | Laterality: Left

## 2016-10-22 MED ORDER — HYDROCODONE-ACETAMINOPHEN 5-325 MG PO TABS: 1.0000 | ORAL_TABLET | Freq: Four times a day (QID) | ORAL | 0 refills | Status: DC | PRN

## 2016-10-22 MED ORDER — BUPIVACAINE HCL (PF) 0.25 % IJ SOLN
INTRAMUSCULAR | Status: AC
  Filled 2016-10-22: qty 30

## 2016-10-22 MED ORDER — SODIUM CHLORIDE 0.9 % IR SOLN
Status: DC | PRN
  Administered 2016-10-22: 6000 mL

## 2016-10-22 MED ORDER — PROPOFOL 10 MG/ML IV BOLUS
INTRAVENOUS | Status: AC
  Filled 2016-10-22: qty 20

## 2016-10-22 MED ORDER — MIDAZOLAM HCL 2 MG/2ML IJ SOLN
INTRAMUSCULAR | Status: AC
  Filled 2016-10-22: qty 2

## 2016-10-22 MED ORDER — BUPIVACAINE-EPINEPHRINE 0.25% -1:200000 IJ SOLN
INTRAMUSCULAR | Status: DC | PRN
  Administered 2016-10-22: 20 mL

## 2016-10-22 MED ORDER — ONDANSETRON HCL 4 MG/2ML IJ SOLN
INTRAMUSCULAR | Status: DC | PRN
  Administered 2016-10-22: 4 mg via INTRAVENOUS

## 2016-10-22 MED ORDER — FENTANYL CITRATE (PF) 100 MCG/2ML IJ SOLN
INTRAMUSCULAR | Status: AC
  Filled 2016-10-22: qty 2

## 2016-10-22 MED ORDER — CEFAZOLIN SODIUM-DEXTROSE 2-4 GM/100ML-% IV SOLN
2.0000 g | INTRAVENOUS | Status: AC
  Administered 2016-10-22: 2 g via INTRAVENOUS
  Filled 2016-10-22: qty 100

## 2016-10-22 MED ORDER — HYDROMORPHONE HCL 1 MG/ML IJ SOLN
0.2500 mg | INTRAMUSCULAR | Status: DC | PRN
  Filled 2016-10-22: qty 0.5

## 2016-10-22 MED ORDER — FENTANYL CITRATE (PF) 100 MCG/2ML IJ SOLN
INTRAMUSCULAR | Status: DC | PRN
  Administered 2016-10-22: 25 ug via INTRAVENOUS
  Administered 2016-10-22: 50 ug via INTRAVENOUS
  Administered 2016-10-22: 25 ug via INTRAVENOUS

## 2016-10-22 MED ORDER — MELOXICAM 15 MG PO TABS: 15.0000 mg | ORAL_TABLET | Freq: Two times a day (BID) | ORAL | 0 refills | Status: DC

## 2016-10-22 MED ORDER — KETOROLAC TROMETHAMINE 30 MG/ML IJ SOLN
INTRAMUSCULAR | Status: DC | PRN
  Administered 2016-10-22: 30 mg via INTRAVENOUS

## 2016-10-22 MED ORDER — MIDAZOLAM HCL 5 MG/5ML IJ SOLN
INTRAMUSCULAR | Status: DC | PRN
  Administered 2016-10-22: 1 mg via INTRAVENOUS

## 2016-10-22 MED ORDER — CEFAZOLIN SODIUM-DEXTROSE 2-4 GM/100ML-% IV SOLN
INTRAVENOUS | Status: AC
  Filled 2016-10-22: qty 100

## 2016-10-22 MED ORDER — LACTATED RINGERS IV SOLN
INTRAVENOUS | Status: DC
  Administered 2016-10-22 (×2): via INTRAVENOUS
  Filled 2016-10-22: qty 1000

## 2016-10-22 MED ORDER — KETOROLAC TROMETHAMINE 30 MG/ML IJ SOLN
INTRAMUSCULAR | Status: AC
  Filled 2016-10-22: qty 1

## 2016-10-22 MED ORDER — LACTATED RINGERS IV SOLN
INTRAVENOUS | Status: DC
  Administered 2016-10-22: 13:00:00 via INTRAVENOUS
  Filled 2016-10-22: qty 1000

## 2016-10-22 MED ORDER — CHLORHEXIDINE GLUCONATE 4 % EX LIQD
60.0000 mL | Freq: Once | CUTANEOUS | Status: DC
  Filled 2016-10-22: qty 118

## 2016-10-22 MED ORDER — ASPIRIN EC 325 MG PO TBEC: 325.0000 mg | DELAYED_RELEASE_TABLET | Freq: Every day | ORAL | 0 refills | Status: AC

## 2016-10-22 MED ORDER — LIDOCAINE-EPINEPHRINE 1 %-1:100000 IJ SOLN
INTRAMUSCULAR | Status: AC
  Filled 2016-10-22: qty 1

## 2016-10-22 MED ORDER — PROPOFOL 500 MG/50ML IV EMUL
INTRAVENOUS | Status: DC | PRN
Start: 2016-10-22 — End: 2016-10-22
  Administered 2016-10-22: 30 mL via INTRAVENOUS
  Administered 2016-10-22: 170 mL via INTRAVENOUS

## 2016-10-22 MED ORDER — DEXAMETHASONE SODIUM PHOSPHATE 10 MG/ML IJ SOLN
INTRAMUSCULAR | Status: DC | PRN
  Administered 2016-10-22: 10 mg via INTRAVENOUS

## 2016-10-22 MED ORDER — LIDOCAINE HCL 1 % IJ SOLN
INTRAMUSCULAR | Status: DC | PRN
  Administered 2016-10-22: 60 mg via INTRADERMAL

## 2016-10-22 MED FILL — ASPIRIN EC 325 MG TABLET: 325 | 30 days supply | Qty: 30 | Fill #0

## 2016-10-22 MED FILL — TRULICITY 1.5 MG/0.5 ML PEN: 1.5 | 28 days supply | Qty: 2 | Fill #7

## 2016-10-22 MED FILL — MELOXICAM 15 MG TABLET: 15 | 30 days supply | Qty: 40 | Fill #0

## 2016-10-22 MED FILL — LANTUS SOLOSTAR 100 UNITS/M: 100 | 28 days supply | Qty: 30 | Fill #2

## 2016-10-22 MED FILL — HYDROCODON-APAP 5-325: 5-325 | 5 days supply | Qty: 50 | Fill #0

## 2016-10-22 MED FILL — LEVOTHYROXINE 125 MCG TABLE: 125 | 90 days supply | Qty: 90 | Fill #0

## 2016-10-22 SURGICAL SUPPLY — 25 items
BANDAGE ACE 6X5 VEL STRL LF (GAUZE/BANDAGES/DRESSINGS) ×3 IMPLANT
BLADE CUDA SHAVER 3.5 (BLADE) ×3 IMPLANT
CLOTH BEACON ORANGE TIMEOUT ST (SAFETY) ×3 IMPLANT
DRAPE ARTHROSCOPY W/POUCH 114 (DRAPES) ×3 IMPLANT
DRAPE LEGGING IMPERMEABLE (DRAPES) ×3 IMPLANT
DRSG EMULSION OIL 3X3 NADH (GAUZE/BANDAGES/DRESSINGS) ×3 IMPLANT
DRSG PAD ABDOMINAL 8X10 ST (GAUZE/BANDAGES/DRESSINGS) ×3 IMPLANT
DURAPREP 26ML APPLICATOR (WOUND CARE) ×3 IMPLANT
GAUZE SPONGE 4X4 16PLY XRAY LF (GAUZE/BANDAGES/DRESSINGS) ×3 IMPLANT
GLOVE BIOGEL M 7.0 STRL (GLOVE) IMPLANT
GLOVE BIOGEL PI IND STRL 7.5 (GLOVE) ×2 IMPLANT
GLOVE BIOGEL PI INDICATOR 7.5 (GLOVE) ×1
GLOVE ORTHO TXT STRL SZ7.5 (GLOVE) ×3 IMPLANT
GOWN STRL REUS W/TWL LRG LVL3 (GOWN DISPOSABLE) IMPLANT
GOWN STRL REUS W/TWL XL LVL3 (GOWN DISPOSABLE) ×6 IMPLANT
KIT BASIN OR (CUSTOM PROCEDURE TRAY) ×3 IMPLANT
MANIFOLD NEPTUNE II (INSTRUMENTS) ×3 IMPLANT
PACK ARTHROSCOPY WL (CUSTOM PROCEDURE TRAY) ×3 IMPLANT
PADDING CAST COTTON 6X4 STRL (CAST SUPPLIES) ×3 IMPLANT
SPONGE GAUZE 4X4 12PLY STER LF (GAUZE/BANDAGES/DRESSINGS) ×3 IMPLANT
SUT ETHILON 4 0 PS 2 18 (SUTURE) ×3 IMPLANT
SYR 30ML LL (SYRINGE) ×3 IMPLANT
TOWEL OR 17X26 10 PK STRL BLUE (TOWEL DISPOSABLE) ×6 IMPLANT
TUBING ARTHRO INFLOW-ONLY STRL (TUBING) ×3 IMPLANT
WRAP KNEE MAXI GEL POST OP (GAUZE/BANDAGES/DRESSINGS) ×3 IMPLANT

## 2016-10-22 NOTE — Discharge Instructions (Signed)
ARTHROSCOPIC KNEE SURGERY HOME CARE INSTRUCTIONS   PAIN You will be expected to have a moderate amount of pain in the affected knee for approximately two weeks.  However, the first two to four days will be the most severe in terms of the pain you will experience.  Prescriptions have been provided for you to take as needed for the pain.  The pain can be markedly reduced by using the ice/compressive bandage given.  Exchange the ice packs whenever they thaw.  During the night, keep the bandage on because it will still provide some compression for the swelling.  Also, keep the leg elevated on pillows above your heart, and this will help alleviate the pain and swelling.  MEDICATION Prescriptions have been provided to take as needed for pain. To prevent blood clots, take Aspirin 325mg  daily with a meal if not on a blood thinner and if no history of stomach ulcers.  ACTIVITY It is preferred that you stay on bedrest for approximately 24 hours.  However, you may go to the bathroom with help.  After this, you can start to be up and about progressively more.  Remember that the swelling may still increase after three to four days if you are up and doing too much.  You may put as much weight on the affected leg as pain will allow.  Use your crutches for comfort and safety.  However, as soon as you are able, you may discard the crutches and go without them.   DRESSING Keep the current dressing as dry as possible.  Two days after your surgery, you may remove the ice/compressive wrap, and surgical dressing.  You may now take a shower, but do not scrub the wounds directly with soap.  Let water rinse over these and gently wipe with your hand.  Reapply band-aids over the puncture wounds and more gauze if needed.  A slight amount of thin drainage can be normal at this time, and do not let it frighten you.  Reapply the ice/compressive wrap.  You may now repeat this every day each time you shower.  SYMPTOMS TO REPORT TO  YOUR DOCTOR  -Extreme pain.  -Extreme swelling.  -Temperature above 101 degrees that does not come down with acetaminophen     (Tylenol).  -Any changes in the feeling, color or movement of your toes.  -Extreme redness, heat, swelling or drainage at your incision  EXERCISE It is preferred that you begin to exercise on the day of your surgery.  Straight leg raises and short arc quads should be begun the afternoon or evening of surgery and continued until you come back for your follow-up appointment.   Attached is an instruction sheet on how to perform these two simple exercises.  Do these at least three times per day if not more.  You may bend your knee as much as is comfortable.  The puncture wounds may occasionally be slightly uncomfortable with bending of the knee.  Do not let this frighten you.  It is important to keep your knee motion, but do not overdo it.  If you have significant pain, simply do not bend the knee as far.   You will be given more exercises to perform at your first return visit.    RETURN APPOINTMENT Please make an appointment to be seen by your doctor in 12 days from your surgery.  Patient Signature:  ________________________________________________________  Nurse's Signature:  ________________________________________________________  Post Anesthesia Home Care Instructions  Activity: Get plenty of rest  for the remainder of the day. A responsible adult should stay with you for 24 hours following the procedure.  For the next 24 hours, DO NOT: -Drive a car -Paediatric nurse -Drink alcoholic beverages -Take any medication unless instructed by your physician -Make any legal decisions or sign important papers.  Meals: Start with liquid foods such as gelatin or soup. Progress to regular foods as tolerated. Avoid greasy, spicy, heavy foods. If nausea and/or vomiting occur, drink only clear liquids until the nausea and/or vomiting subsides. Call your physician if vomiting  continues.  Special Instructions/Symptoms: Your throat may feel dry or sore from the anesthesia or the breathing tube placed in your throat during surgery. If this causes discomfort, gargle with warm salt water. The discomfort should disappear within 24 hours.  If you had a scopolamine patch placed behind your ear for the management of post- operative nausea and/or vomiting:  1. The medication in the patch is effective for 72 hours, after which it should be removed.  Wrap patch in a tissue and discard in the trash. Wash hands thoroughly with soap and water. 2. You may remove the patch earlier than 72 hours if you experience unpleasant side effects which may include dry mouth, dizziness or visual disturbances. 3. Avoid touching the patch. Wash your hands with soap and water after contact with the patch.

## 2016-10-22 NOTE — Op Note (Signed)
Morgan Schmidt, Morgan Schmidt              ACCOUNT NO.:  [id number]  MEDICAL RECORD NO.:  [id number]  LOCATION:                                 FACILITY:  PHYSICIAN:  Pietro Cassis. Alvan Dame, M.D.       DATE OF BIRTH:  DATE OF PROCEDURE:  10/22/2016 DATE OF DISCHARGE:                              OPERATIVE REPORT   PREOPERATIVE DIAGNOSIS:  Left knee medial meniscal tear associated with chondromalacia.  POSTOPERATIVE DIAGNOSES/FINDINGS: 1. Posterior horn midbody tearing of the medial meniscus. 2. Grade 3 chondromalacia along the medial femoral condyle, grade 2 on     the medial tibial plateau surface, and grade 3 patella changes with     grade 2 stable trochlea.  PROCEDURE: 1. Left knee diagnostic and operative arthroscopy with medial partial     meniscectomy. 2. Medial and patellofemoral chondroplasty with debridement of     unstable flaps of cartilage.  No evidence of eburnated bone.  There     is no abrasion or microfracture required.  SURGEON:  Pietro Cassis. Alvan Dame, M.D.  ASSISTANT:  None.  ANESTHESIA:  General.  SPECIMENS:  None.  COMPLICATION:  None.  DRAINS:  None.  TOURNIQUET:  Not utilized.  INDICATIONS FOR PROCEDURE:  Morgan Schmidt is a 66 year old female, who had presented to the office after about 6-week history of some aggravation to her left knee.  She had not responded to conservative treatment, had persistent pain and symptoms.  We reviewed treatment options including continued observation; however, the fact none required a light, but ability for work.  She wished to proceed with arthroscopic surgery. Consent was obtained after reviewing risks of infection, DVT.  PROCEDURE IN DETAIL:  The patient was brought to the operative theater. Once adequate anesthesia, preoperative antibiotics administered, she was positioned supine with her left leg in leg holder.  The left lower extremity was then prepped and draped in sterile fashion.  Time-out was performed identifying the  patient, planned procedure, and extremity. Standard inferior lateral, superior lateral, and inferior medial portals were utilized.  Diagnostic evaluation of the knee revealed the above findings.  The inferior medial portal was utilized as a sole working portal. Following examination of the medial compartment of the joint, I used a straight-biting basket to perform medial meniscus removing the central portion of the posterior aspect of the meniscus about 20%.  I then inserted a 3.5 Cuda shaver and removed meniscal fragments and blended the remaining meniscus back to a stable level.  Repeat examination of meniscus found at this point, was stable.  There was a little bit of some horizontal change to the meniscus on the posterior aspect that is stable.  The 3.5 Cuda shaver was then reintroduced and used to perform a chondroplasty on the medial femoral condyle surface predominantly, less on the tibial plateau surface.  No evidence of any eburnated bone.  Once this was carried out, I examined the anterior aspect of joint, found an intact anterior cruciate ligament and that her lateral compartment was totally normal.  We then readdressed anteriorly using a 3.5 Cuda shaver to perform chondroplasty, debridement of unstable flaps on the patella undersurface.  The trochlea was relatively intact.  Following  the above stated procedures, I re-examined the knee to make certain I was satisfied with the level of stability of the meniscus as well as the articular cartilage.  Once this was done, the instrumentation was removed from the knee.  Portal sites reapproximated using a 4-0 nylon.  The knee was injected at end of the case with 0.25% Marcaine.  The knee was then cleaned, dried, and dressed sterilely with Adaptic and sterile bulky Jones dressing.  She was then brought to the recovery room in stable condition, tolerating the procedure well.     Pietro Cassis Alvan Dame, M.D.     MDO/MEDQ  D:   10/22/2016  T:  10/22/2016  Job:  [id number]

## 2016-10-22 NOTE — Brief Op Note (Signed)
10/22/2016  1:52 PM  PATIENT:  Morgan Schmidt  66 y.o. female  PRE-OPERATIVE DIAGNOSIS:  1.  left knee medial meniscus tear, 2. Grade III chondromalacia in the medial compartment, 3. Grade II-III in the patellofemoral compartment  POST-OPERATIVE DIAGNOSIS: 1.  left knee medial meniscus tear, 2. Grade III chondromalacia in the medial compartment, 3. Grade II-III in the patellofemoral compartment  PROCEDURE:  Procedure(s): 1. Left knee arthroscopy medial partial meniscectomy 2. Left knee medial and patellofemoral chondroplasty, debridement  SURGEON:  Surgeon(s) and Role:    * Paralee Cancel, MD - Primary  PHYSICIAN ASSISTANT: None  ANESTHESIA:   general plus local  EBL:  No intake/output data recorded.  BLOOD ADMINISTERED:none  DRAINS: none   LOCAL MEDICATIONS USED:  MARCAINE     SPECIMEN:  No Specimen  DISPOSITION OF SPECIMEN:  N/A  COUNTS:  YES  TOURNIQUET:  * No tourniquets in log *  DICTATION: .Other Dictation: Dictation Number [id number]  PLAN OF CARE: Discharge to home after PACU  PATIENT DISPOSITION:  PACU - hemodynamically stable.   Delay start of Pharmacological VTE agent (>24hrs) due to surgical blood loss or risk of bleeding: no

## 2016-10-22 NOTE — H&P (Signed)
CC- Morgan Schmidt is a 66 y.o. female who presents with left knee pain.  HPI- . Knee Pain: Patient presents with knee pain involving the  left knee. Onset of the symptoms was about a month ago. Inciting event: twisting injury while routine work. Current symptoms include foreign body sensation, giving out, locking, pain located medial joint line and swelling. Pain is aggravated by any weight bearing, going up and down stairs, lateral movements and squatting.  Patient has had no prior knee problems. Evaluation to date: plain films: normal and MRI: revealin medial meniscus tear and chondromalacia medial and patellofemoral compartments. Treatment to date: avoidance of offending activity, corticosteroid injection which was not very effective and OTC analgesics which are not very effective.  Past Medical History:  Diagnosis Date  . Arthritis   . Cancer ([facility])    thyroid CA  . Diabetes mellitus    type 2  . Thyroid disease     Past Surgical History:  Procedure Laterality Date  . CESAREAN SECTION    . THYROIDECTOMY  2009    Prior to Admission medications   Medication Sig Start Date End Date Taking? Authorizing Provider  aspirin EC 81 MG tablet Take 81 mg by mouth every evening.    Yes Historical Provider, MD  Cholecalciferol (VITAMIN D) 2000 UNITS tablet Take 1 tablet (2,000 Units total) by mouth daily. Patient taking differently: Take 2,000 Units by mouth every evening.  03/18/15  Yes Claretta Fraise, MD  Choline Fenofibrate 135 MG capsule Take 1 capsule (135 mg total) by mouth daily. Patient taking differently: Take 135 mg by mouth every evening.  01/03/16  Yes Claretta Fraise, MD  diclofenac sodium (VOLTAREN) 1 % GEL Apply 2 g topically 4 (four) times daily. To affected joints at hands, fingers Patient taking differently: Apply 2 g topically 2 (two) times daily as needed. To affected joints at hands, fingers 06/25/16  Yes Claretta Fraise, MD  Dulaglutide (TRULICITY) 1.5 0000000 SOPN One dose  weekly Patient taking differently: Inject 1.5 mg into the skin every 7 (seven) days. One dose weekly on Fridays 03/23/16  Yes Claretta Fraise, MD  ezetimibe (ZETIA) 10 MG tablet Take 1 tablet (10 mg total) by mouth daily. For cholesterol Patient taking differently: Take 10 mg by mouth every evening. For cholesterol 10/07/15  Yes Claretta Fraise, MD  Insulin Glargine (LANTUS SOLOSTAR) 100 UNIT/ML Solostar Pen Inject 110 Units into the skin daily. Patient taking differently: Inject 110 Units into the skin daily at 10 pm.  08/31/16  Yes Claretta Fraise, MD  levothyroxine (SYNTHROID, LEVOTHROID) 125 MCG tablet Take 1 tablet (125 mcg total) by mouth daily. 12/26/15  Yes Claretta Fraise, MD  lisinopril (PRINIVIL,ZESTRIL) 5 MG tablet TAKE 1 TABLET BY MOUTH DAILY. Patient taking differently: TAKE 1 TABLET BY MOUTH DAILY IN THE EVENING 11/29/15  Yes Claretta Fraise, MD  metFORMIN (GLUCOPHAGE) 1000 MG tablet TAKE 1 TABLET BY MOUTH 2 TIMES DAILY WITH A MEAL. Patient taking differently: Take 1,000 mg by mouth 2 (two) times daily with a meal. WITH A MEAL. 04/25/16  Yes Claretta Fraise, MD  naproxen sodium (ANAPROX) 220 MG tablet Take 220 mg by mouth 2 (two) times daily as needed (pain).   Yes Historical Provider, MD  diclofenac (VOLTAREN) 50 MG EC tablet Take 1 tablet (50 mg total) by mouth 2 (two) times daily. Patient not taking: Reported on 10/15/2016 09/22/16   Ashley Murrain, NP  Glucose Blood (BLOOD GLUCOSE TEST STRIPS) STRP 1 Units by Other route 2 (two)  times daily. 03/18/15   Claretta Fraise, MD  LEVEMIR FLEXTOUCH 100 UNIT/ML Pen INJECT 110 UNITS ONCE DAILY Patient not taking: Reported on 10/15/2016 07/25/16   Claretta Fraise, MD  NOVOFINE 30G X 8 MM MISC USE AS DIRECTED ONCE DAILY 09/14/16   Claretta Fraise, MD  Vitamin D, Ergocalciferol, (DRISDOL) 50000 units CAPS capsule Take 1 capsule (50,000 Units total) by mouth 2 (two) times a week. Patient not taking: Reported on 10/15/2016 03/29/16   Claretta Fraise, MD   Left knee exam:  soft  tissue tenderness over the medial joint line, effusion, reduced range of motion, collateral ligaments intact, normal ipsilateral hip exam, normal contralateral knee exam  Physical Examination: General appearance - alert, well appearing, and in no distress Mental status - alert, oriented to person, place, and time, normal mood, behavior, speech, dress, motor activity, and thought processes Chest - no tachypnea, retractions or cyanosis Heart - normal rate and regular rhythm Abdomen - soft, nontender, nondistended Musculoskeletal -  see above Extremities - peripheral pulses normal, no pedal edema Skin - normal coloration and turgor, no rashes   Asessment/Plan--- Left knee medial meniscal tear and chondromalacia   Plan left knee arthroscopy with meniscal debridement and chondroplasty medial and patellofemoral compartments. Procedure risks and potential comps discussed with patient who elects to proceed. Goals are decreased pain and increased function with a high likelihood of achieving both

## 2016-10-22 NOTE — Transfer of Care (Signed)
Immediate Anesthesia Transfer of Care Note  Patient: Morgan Schmidt  Procedure(s) Performed: Procedure(s): ARTHROSCOPY LEFT KNEE WITH DEBRIDEMENT (Left) KNEE ARTHROSCOPY WITH MEDIAL MENISECTOMY  Patient Location: PACU  Anesthesia Type:General  Level of Consciousness: awake, alert , oriented and patient cooperative  Airway & Oxygen Therapy: Patient Spontanous Breathing and Patient connected to nasal cannula oxygen  Post-op Assessment: Report given to RN and Post -op Vital signs reviewed and stable  Post vital signs: Reviewed and stable  Last Vitals:  Vitals:   10/22/16 1231  BP: 121/67  Pulse: (!) 112  Resp: 16  Temp: 37.1 C    Last Pain:  Vitals:   10/22/16 1253  TempSrc:   PainSc: 6       Patients Stated Pain Goal: 8 ([id number])  Complications: No apparent anesthesia complications

## 2016-10-22 NOTE — Anesthesia Procedure Notes (Signed)
Procedure Name: LMA Insertion Date/Time: 10/22/2016 2:06 PM Performed by: Wanita Chamberlain Pre-anesthesia Checklist: Patient identified, Timeout performed, Emergency Drugs available, Suction available and Patient being monitored Patient Re-evaluated:Patient Re-evaluated prior to inductionOxygen Delivery Method: Circle system utilized Preoxygenation: Pre-oxygenation with 100% oxygen Intubation Type: IV induction Ventilation: Mask ventilation without difficulty LMA: LMA inserted LMA Size: 4.0 Number of attempts: 1 Placement Confirmation: positive ETCO2 and breath sounds checked- equal and bilateral Tube secured with: Tape Dental Injury: Teeth and Oropharynx as per pre-operative assessment

## 2016-10-22 NOTE — Anesthesia Postprocedure Evaluation (Addendum)
Anesthesia Post Note  Patient: KERIAN BIBIAN  Procedure(s) Performed: Procedure(s) (LRB): ARTHROSCOPY LEFT KNEE WITH DEBRIDEMENT (Left) KNEE ARTHROSCOPY WITH MEDIAL MENISECTOMY KNEE ARTHROSCOPY WITH MEDIAL CHONDROPLASTY (Left)  Patient location during evaluation: PACU Anesthesia Type: General Level of consciousness: awake and alert Pain management: pain level controlled Vital Signs Assessment: post-procedure vital signs reviewed and stable Respiratory status: spontaneous breathing, nonlabored ventilation, respiratory function stable and patient connected to nasal cannula oxygen Cardiovascular status: blood pressure returned to baseline and stable Postop Assessment: no signs of nausea or vomiting Anesthetic complications: no       Last Vitals:  Vitals:   10/22/16 1545 10/22/16 1615  BP: (!) 149/77 (!) 150/69  Pulse: 77 83  Resp: 14 14  Temp:  36.7 C    Last Pain:  Vitals:   10/22/16 1253  TempSrc:   PainSc: 6                  Jaekwon Mcclune S

## 2016-10-22 NOTE — Anesthesia Preprocedure Evaluation (Addendum)
Anesthesia Evaluation   Patient awake    Reviewed: Allergy & Precautions, NPO status , Patient's Chart, lab work & pertinent test results  Airway Mallampati: II  TM Distance: >3 FB   Mouth opening: Limited Mouth Opening  Dental  (+) Lower Dentures, Upper Dentures, Dental Advisory Given   Pulmonary former smoker,    breath sounds clear to auscultation       Cardiovascular hypertension,  Rhythm:Regular Rate:Normal  18-Oct-2016  Normal sinus rhythm Right bundle branch block Abnormal ECG   Neuro/Psych    GI/Hepatic negative GI ROS, Neg liver ROS,   Endo/Other  diabetes  Renal/GU negative Renal ROS     Musculoskeletal   Abdominal   Peds  Hematology   Anesthesia Other Findings   Reproductive/Obstetrics                           Anesthesia Physical Anesthesia Plan  ASA: III  Anesthesia Plan: General   Post-op Pain Management:    Induction: Intravenous  Airway Management Planned: LMA  Additional Equipment:   Intra-op Plan:   Post-operative Plan: Extubation in OR  Informed Consent: I have reviewed the patients History and Physical, chart, labs and discussed the procedure including the risks, benefits and alternatives for the proposed anesthesia with the patient or authorized representative who has indicated his/her understanding and acceptance.   Dental advisory given  Plan Discussed with: CRNA and Anesthesiologist  Anesthesia Plan Comments:         Anesthesia Quick Evaluation

## 2016-10-23 ENCOUNTER — Encounter (HOSPITAL_BASED_OUTPATIENT_CLINIC_OR_DEPARTMENT_OTHER): Payer: Self-pay | Admitting: Orthopedic Surgery

## 2016-11-06 ENCOUNTER — Other Ambulatory Visit (INDEPENDENT_AMBULATORY_CARE_PROVIDER_SITE_OTHER): Payer: 59

## 2016-11-06 DIAGNOSIS — E038 Other specified hypothyroidism: Secondary | ICD-10-CM

## 2016-11-06 DIAGNOSIS — E1169 Type 2 diabetes mellitus with other specified complication: Secondary | ICD-10-CM | POA: Diagnosis not present

## 2016-11-06 DIAGNOSIS — E782 Mixed hyperlipidemia: Secondary | ICD-10-CM | POA: Diagnosis not present

## 2016-11-06 DIAGNOSIS — M94262 Chondromalacia, left knee: Secondary | ICD-10-CM | POA: Diagnosis not present

## 2016-11-06 LAB — BAYER DCA HB A1C WAIVED: HB A1C: 7.3 % — AB (ref <7.0)

## 2016-11-07 ENCOUNTER — Ambulatory Visit: Payer: 59 | Attending: Orthopedic Surgery | Admitting: Physical Therapy

## 2016-11-07 ENCOUNTER — Encounter: Payer: Self-pay | Admitting: Physical Therapy

## 2016-11-07 DIAGNOSIS — M25562 Pain in left knee: Secondary | ICD-10-CM | POA: Insufficient documentation

## 2016-11-07 LAB — CBC WITH DIFFERENTIAL/PLATELET
BASOS ABS: 0.1 10*3/uL (ref 0.0–0.2)
Basos: 2 %
EOS (ABSOLUTE): 0.2 10*3/uL (ref 0.0–0.4)
Eos: 3 %
Hematocrit: 39.7 % (ref 34.0–46.6)
Hemoglobin: 13.7 g/dL (ref 11.1–15.9)
Immature Grans (Abs): 0 10*3/uL (ref 0.0–0.1)
Immature Granulocytes: 0 %
LYMPHS ABS: 2.4 10*3/uL (ref 0.7–3.1)
LYMPHS: 32 %
MCH: 28 pg (ref 26.6–33.0)
MCHC: 34.5 g/dL (ref 31.5–35.7)
MCV: 81 fL (ref 79–97)
MONOS ABS: 0.4 10*3/uL (ref 0.1–0.9)
Monocytes: 6 %
Neutrophils Absolute: 4.3 10*3/uL (ref 1.4–7.0)
Neutrophils: 57 %
Platelets: 253 10*3/uL (ref 150–379)
RBC: 4.89 x10E6/uL (ref 3.77–5.28)
RDW: 13.9 % (ref 12.3–15.4)
WBC: 7.4 10*3/uL (ref 3.4–10.8)

## 2016-11-07 LAB — CMP14+EGFR
A/G RATIO: 2 (ref 1.2–2.2)
ALK PHOS: 52 IU/L (ref 39–117)
ALT: 35 IU/L — AB (ref 0–32)
AST: 44 IU/L — ABNORMAL HIGH (ref 0–40)
Albumin: 4.5 g/dL (ref 3.6–4.8)
BILIRUBIN TOTAL: 0.4 mg/dL (ref 0.0–1.2)
BUN/Creatinine Ratio: 21 (ref 12–28)
BUN: 15 mg/dL (ref 8–27)
CHLORIDE: 99 mmol/L (ref 96–106)
CO2: 24 mmol/L (ref 18–29)
Calcium: 8.8 mg/dL (ref 8.7–10.3)
Creatinine, Ser: 0.71 mg/dL (ref 0.57–1.00)
GFR calc Af Amer: 105 mL/min/{1.73_m2} (ref >59)
GFR calc non Af Amer: 91 mL/min/{1.73_m2} (ref >59)
GLUCOSE: 96 mg/dL (ref 65–99)
Globulin, Total: 2.2 g/dL (ref 1.5–4.5)
POTASSIUM: 4.5 mmol/L (ref 3.5–5.2)
Sodium: 141 mmol/L (ref 134–144)
Total Protein: 6.7 g/dL (ref 6.0–8.5)

## 2016-11-07 LAB — LIPID PANEL
CHOLESTEROL TOTAL: 166 mg/dL (ref 100–199)
Chol/HDL Ratio: 4 ratio units (ref 0.0–4.4)
HDL: 42 mg/dL (ref >39)
LDL Calculated: 87 mg/dL (ref 0–99)
Triglycerides: 186 mg/dL — ABNORMAL HIGH (ref 0–149)
VLDL Cholesterol Cal: 37 mg/dL (ref 5–40)

## 2016-11-07 LAB — T4 AND TSH
T4, Total: 13.9 ug/dL — ABNORMAL HIGH (ref 4.5–12.0)
TSH: 2.12 u[IU]/mL (ref 0.450–4.500)

## 2016-11-07 NOTE — Therapy (Signed)
[facility] [place], [place], [address] Phone: [phone]   Fax:  [phone]  Physical Therapy Evaluation  Patient Details  Name: Morgan Schmidt MRN: [id number] Date of Birth: [birth year] Referring Provider: Paralee Cancel MD.  Encounter Date: 11/07/2016      PT End of Session - 11/07/16 1341    Visit Number 1   Number of Visits 12   Date for PT Re-Evaluation 12/05/16   PT Start Time 0946   PT Stop Time 1029   PT Time Calculation (min) 43 min   Activity Tolerance Patient tolerated treatment well   Behavior During Therapy [facility] for tasks assessed/performed      Past Medical History:  Diagnosis Date  . Arthritis   . Cancer ([place])    thyroid CA  . Diabetes mellitus    type 2  . Thyroid disease     Past Surgical History:  Procedure Laterality Date  . CESAREAN SECTION    . KNEE ARTHROSCOPY Left 10/22/2016   Procedure: ARTHROSCOPY LEFT KNEE WITH DEBRIDEMENT;  Surgeon: Paralee Cancel, MD;  Location: [facility];  Service: Orthopedics;  Laterality: Left;  . KNEE ARTHROSCOPY WITH MEDIAL MENISECTOMY  10/22/2016   Procedure: KNEE ARTHROSCOPY WITH MEDIAL MENISECTOMY;  Surgeon: Paralee Cancel, MD;  Location: [facility];  Service: Orthopedics;;  . KNEE ARTHROSCOPY WITH SUBCHONDROPLASTY Left 10/22/2016   Procedure: KNEE ARTHROSCOPY WITH MEDIAL CHONDROPLASTY;  Surgeon: Paralee Cancel, MD;  Location: [facility];  Service: Orthopedics;  Laterality: Left;  . THYROIDECTOMY  2009    There were no vitals filed for this visit.       Subjective Assessment - 11/07/16 1334    Subjective The patient underwent a left knee medial meniscectomy on 10/22/16.  She is pleased with her progress thus far but cannot perform a reciprocating stair gait at this time.  Pain rated at a 4/10 increasing if standing to long.  Ice and elevation decrease pain.   Limitations Standing   How long can you stand comfortably?  10-15 minutes.   Pain Score 4    Pain Location Knee   Pain Descriptors / Indicators Aching   Pain Type Acute pain            [facility] PT Assessment - 11/07/16 0001      Assessment   Medical Diagnosis Left knee scope.   Referring Provider Paralee Cancel MD.   Onset Date/Surgical Date --  10/22/16 (surgery date).     Precautions   Precautions --  Pain-free left quadriceps strengthening.     Restrictions   Weight Bearing Restrictions No     Balance Screen   Has the patient fallen in the past 6 months No   Has the patient had a decrease in activity level because of a fear of falling?  No   Is the patient reluctant to leave their home because of a fear of falling?  No     Home Environment   Living Environment Private residence     Prior Function   Level of Independence Independent     Observation/Other Assessments   Observations Left knee scope sites appears to be healing well.     Observation/Other Assessments-Edema    Edema Circumferential     Circumferential Edema   Circumferential - Right Left 1 cm > right.     Posture/Postural Control   Posture/Postural Control No significant limitations     ROM / Strength   AROM / PROM /  Strength AROM;Strength     AROM   Overall AROM Comments Left knee AROM:  to to 125 degrees.     Strength   Overall Strength Comments Left hip abduction= 4+/5 and left kne extension= 4+/5.     Palpation   Palpation comment Minimal tenderness medial and lateral joint line on left knee.     Ambulation/Gait   Gait Comments Minimal decrease in left stance time.                   [facility] Adult PT Treatment/Exercise - 11/07/16 0001      Modalities   Modalities Electrical Stimulation;Vasopneumatic     Electrical Stimulation   Electrical Stimulation Location left knee.   Electrical Stimulation Action IFC at 1-10 Hz x 15 minutes.   Electrical Stimulation Goals Edema;Pain     Vasopneumatic   Number Minutes Vasopneumatic  15 minutes    Vasopnuematic Location  --  Left knee.   Vasopneumatic Pressure Medium                     PT Long Term Goals - 11/07/16 1421      PT LONG TERM GOAL #1   Title Independent with a HEP.   Time 4   Period Weeks   Status New     PT LONG TERM GOAL #2   Title Increase knee strength to a solid 5/5 to provide good stability for accomplishment of functional activities   Time 4   Period Weeks   Status New     PT LONG TERM GOAL #3   Title Perform a reciprocating stair gait with one railing with pain not > 2-3/10.   Time 4   Period Weeks   Status New               Plan - 11/07/16 1424    Clinical Impression Statement The patient prsents to OPPT with left knee pain following arthroscopic surgery.  Her current limitations prohibit her from performing a reciprocating stair gait and standing for long periods of time.  Patient will benefit from skilled physical therapy.   Rehab Potential Excellent   PT Frequency 3x / week   PT Treatment/Interventions ADLs/Self Care Home Management;Cryotherapy;Electrical Stimulation;Moist Heat;Ultrasound;Stair training;Patient/family education;Neuromuscular re-education;Therapeutic exercise;Therapeutic activities;Manual techniques;Passive range of motion;Vasopneumatic Device   PT Next Visit Plan PAIN-FREE left quadriceps strengthening; left hip abduction and "clamshell" exercise.  Modalites as needed.   Consulted and Agree with Plan of Care Patient      Patient will benefit from skilled therapeutic intervention in order to improve the following deficits and impairments:  Pain, Decreased activity tolerance, Decreased strength  Visit Diagnosis: Acute pain of left knee - Plan: PT plan of care cert/re-cert     Problem List Patient Active Problem List   Diagnosis Date Noted  . Essential hypertension 03/23/2016  . Other specified hypothyroidism 12/16/2014  . Mixed diabetic hyperlipidemia associated with type 2 diabetes mellitus ([place])  12/16/2014  . Long term current use of insulin ([place]) 12/16/2014  . Polyposis of colon 12/16/2014  . History of thyroid cancer 08/24/2011    Anavi Branscum, [place] MPT 11/07/2016, 2:28 PM  [facility] [address] [place], [place], [address] Phone: [phone]   Fax:  [phone]  Name: MERCED BROUGHAM MRN: [id number] Date of Birth: October 04, 1950

## 2016-11-08 ENCOUNTER — Ambulatory Visit (INDEPENDENT_AMBULATORY_CARE_PROVIDER_SITE_OTHER): Payer: 59 | Admitting: Family Medicine

## 2016-11-08 ENCOUNTER — Encounter: Payer: Self-pay | Admitting: Family Medicine

## 2016-11-08 VITALS — BP 136/67 | HR 81 | Temp 97.5°F | Ht 59.0 in | Wt 169.0 lb

## 2016-11-08 DIAGNOSIS — Z794 Long term (current) use of insulin: Secondary | ICD-10-CM

## 2016-11-08 DIAGNOSIS — E038 Other specified hypothyroidism: Secondary | ICD-10-CM | POA: Diagnosis not present

## 2016-11-08 DIAGNOSIS — E782 Mixed hyperlipidemia: Secondary | ICD-10-CM | POA: Diagnosis not present

## 2016-11-08 DIAGNOSIS — E1169 Type 2 diabetes mellitus with other specified complication: Secondary | ICD-10-CM | POA: Diagnosis not present

## 2016-11-08 DIAGNOSIS — I10 Essential (primary) hypertension: Secondary | ICD-10-CM

## 2016-11-08 MED ORDER — INSULIN GLARGINE 100 UNIT/ML SOLOSTAR PEN: 110.0000 [IU] | PEN_INJECTOR | SUBCUTANEOUS | 99 refills | Status: DC

## 2016-11-08 MED ORDER — ACCU-CHEK FASTCLIX LANCETS MISC: 3 refills | Status: DC

## 2016-11-08 MED ORDER — GLUCOSE BLOOD VI STRP: ORAL_STRIP | 3 refills | Status: DC

## 2016-11-08 MED ORDER — MELOXICAM 15 MG PO TABS: 15.0000 mg | ORAL_TABLET | Freq: Every day | ORAL | 1 refills | Status: DC

## 2016-11-08 MED ORDER — VALSARTAN 160 MG PO TABS: 160.0000 mg | ORAL_TABLET | Freq: Every day | ORAL | 3 refills | Status: DC

## 2016-11-08 MED FILL — VALSARTAN 160 MG TABLET: 160 | 90 days supply | Qty: 90 | Fill #0

## 2016-11-08 MED FILL — ACCU-CHEK FASTCLIX LANCETS: 51 days supply | Qty: 102 | Fill #0

## 2016-11-08 MED FILL — ACCU-CHEK GUIDE TEST STRIP: 50 days supply | Qty: 100 | Fill #0

## 2016-11-08 NOTE — Progress Notes (Signed)
Subjective:  [name], female    DOB: Oct 11, 1950  Age: 66 y.o. MRN: [id number]  CC: Diabetes (pt here today for routine follow up on her diabetes)   HPI Morgan Schmidt presents for  follow-up of hypertension. Patient has no history of headache chest pain or shortness of breath or recent cough. Patient also denies symptoms of TIA such as numbness weakness lateralizing. Patient checks  blood pressure at home. Recent readings have been good Patient denies side effects from medication. States taking it regularly.  Patient also  in for follow-up of elevated cholesterol. Doing well without complaints on current medication. Denies side effects of statin including myalgia and arthralgia and nausea. Also in today for liver function testing. Currently no chest pain, shortness of breath or other cardiovascular related symptoms noted.  Follow-up of diabetes. Patient does check blood sugar at home. Readings run between 90-100 fasting  and 150-180 PP Patient denies symptoms such as polyuria, polydipsia, excessive hunger, nausea A few significant hypoglycemic spells noted. Glucose in AM, fasting dropped X 3 to 70. No symptoms though. Medications reviewed. Pt reports taking them regularly. Pt. denies complication/adverse reaction today.    History Morgan Schmidt has a past medical history of Arthritis; Cancer ([place]); Diabetes mellitus; and Thyroid disease.   She has a past surgical history that includes Thyroidectomy (2009); Cesarean section; Knee arthroscopy (Left, 10/22/2016); Knee arthroscopy with medial menisectomy (10/22/2016); and Knee arthroscopy with subchondroplasty (Left, 10/22/2016).   Her family history is not on file.She reports that she quit smoking about 16 years ago. She has never used smokeless tobacco. She reports that she does not drink alcohol or use drugs.  Current Outpatient Prescriptions on File Prior to Visit  Medication Sig Dispense Refill  . aspirin EC 325 MG tablet Take  1 tablet (325 mg total) by mouth daily. Take for 4 weeks. 30 tablet 0  . Cholecalciferol (VITAMIN D) 2000 UNITS tablet Take 1 tablet (2,000 Units total) by mouth daily. (Patient taking differently: Take 2,000 Units by mouth every evening. ) 30 tablet 11  . Choline Fenofibrate 135 MG capsule Take 1 capsule (135 mg total) by mouth daily. (Patient taking differently: Take 135 mg by mouth every evening. ) 30 capsule 5  . diclofenac sodium (VOLTAREN) 1 % GEL Apply topically 4 (four) times daily.    . Dulaglutide (TRULICITY) 1.5 GU/4.4IH SOPN One dose weekly (Patient taking differently: Inject 1.5 mg into the skin every 7 (seven) days. One dose weekly on Fridays) 4 pen 11  . ezetimibe (ZETIA) 10 MG tablet Take 1 tablet (10 mg total) by mouth daily. For cholesterol (Patient taking differently: Take 10 mg by mouth every evening. For cholesterol) 90 tablet 3  . levothyroxine (SYNTHROID, LEVOTHROID) 125 MCG tablet TAKE 1 TABLET BY MOUTH DAILY. 90 tablet 0  . metFORMIN (GLUCOPHAGE) 1000 MG tablet TAKE 1 TABLET BY MOUTH 2 TIMES DAILY WITH A MEAL. (Patient taking differently: Take 1,000 mg by mouth 2 (two) times daily with a meal. WITH A MEAL.) 180 tablet 1  . NOVOFINE 30G X 8 MM MISC USE AS DIRECTED ONCE DAILY 100 each 1   No current facility-administered medications on file prior to visit.     ROS Review of Systems  Constitutional: Negative for activity change, appetite change and fever.  HENT: Negative for congestion, rhinorrhea and sore throat.   Eyes: Negative for visual disturbance.  Respiratory: Negative for cough and shortness of breath.   Cardiovascular: Negative for chest pain and palpitations.  Gastrointestinal: Negative for abdominal pain, diarrhea and nausea.  Genitourinary: Negative for dysuria.  Musculoskeletal: Negative for arthralgias and myalgias.    Objective:  BP 136/67   Pulse 81   Temp 97.5 F (36.4 C) (Oral)   Ht 4' 11"  (1.499 m)   Wt 169 lb (76.7 kg)   LMP 03/02/2004   BMI  34.13 kg/m   BP Readings from Last 3 Encounters:  11/08/16 136/67  10/22/16 (!) 150/69  10/18/16 (!) 147/58    Wt Readings from Last 3 Encounters:  11/08/16 169 lb (76.7 kg)  10/22/16 167 lb (75.8 kg)  10/18/16 170 lb 9.6 oz (77.4 kg)     Physical Exam  Constitutional: She is oriented to person, place, and time. She appears well-developed and well-nourished. No distress.  HENT:  Head: Normocephalic and atraumatic.  Right Ear: External ear normal.  Left Ear: External ear normal.  Nose: Nose normal.  Mouth/Throat: Oropharynx is clear and moist.  Eyes: Conjunctivae and EOM are normal. Pupils are equal, round, and reactive to light.  Neck: Normal range of motion. Neck supple. No thyromegaly present.  Cardiovascular: Normal rate, regular rhythm and normal heart sounds.   No murmur heard. Pulmonary/Chest: Effort normal and breath sounds normal. No respiratory distress. She has no wheezes. She has no rales.  Abdominal: Soft. Bowel sounds are normal. She exhibits no distension. There is no tenderness.  Lymphadenopathy:    She has no cervical adenopathy.  Neurological: She is alert and oriented to person, place, and time. She has normal reflexes.  Skin: Skin is warm and dry.  Psychiatric: She has a normal mood and affect. Her behavior is normal. Judgment and thought content normal.        Assessment & Plan:   Morgan Schmidt was seen today for diabetes.  Diagnoses and all orders for this visit:  Other specified hypothyroidism  Mixed diabetic hyperlipidemia associated with type 2 diabetes mellitus ([place])  Long term current use of insulin ([place])  Essential hypertension  Other orders -     ACCU-CHEK FASTCLIX LANCETS MISC; Use to check Blood Sugars twice daily. -     glucose blood (ACCU-CHEK GUIDE) test strip; Use as instructed -     valsartan (DIOVAN) 160 MG tablet; Take 1 tablet (160 mg total) by mouth daily. For Blood pressure -     meloxicam (MOBIC) 15 MG tablet; Take 1 tablet  (15 mg total) by mouth daily. -     Insulin Glargine (LANTUS SOLOSTAR) 100 UNIT/ML Solostar Pen; Inject 110 Units into the skin every morning.   Results for orders placed or performed in visit on 11/06/16  Bayer DCA Hb A1c Waived  Result Value Ref Range   Bayer DCA Hb A1c Waived 7.3 (H) <7.0 %  CBC with Differential/Platelet  Result Value Ref Range   WBC 7.4 3.4 - 10.8 x10E3/uL   RBC 4.89 3.77 - 5.28 x10E6/uL   Hemoglobin 13.7 11.1 - 15.9 g/dL   Hematocrit 39.7 34.0 - 46.6 %   MCV 81 79 - 97 fL   MCH 28.0 26.6 - 33.0 pg   MCHC 34.5 31.5 - 35.7 g/dL   RDW 13.9 12.3 - 15.4 %   Platelets 253 150 - 379 x10E3/uL   Neutrophils 57 Not Estab. %   Lymphs 32 Not Estab. %   Monocytes 6 Not Estab. %   Eos 3 Not Estab. %   Basos 2 Not Estab. %   Neutrophils Absolute 4.3 1.4 - 7.0 x10E3/uL   Lymphocytes Absolute  2.4 0.7 - 3.1 x10E3/uL   Monocytes Absolute 0.4 0.1 - 0.9 x10E3/uL   EOS (ABSOLUTE) 0.2 0.0 - 0.4 x10E3/uL   Basophils Absolute 0.1 0.0 - 0.2 x10E3/uL   Immature Granulocytes 0 Not Estab. %   Immature Grans (Abs) 0.0 0.0 - 0.1 x10E3/uL  CMP14+EGFR  Result Value Ref Range   Glucose 96 65 - 99 mg/dL   BUN 15 8 - 27 mg/dL   Creatinine, Ser 0.71 0.57 - 1.00 mg/dL   GFR calc non Af Amer 91 >59 mL/min/1.73   GFR calc Af Amer 105 >59 mL/min/1.73   BUN/Creatinine Ratio 21 12 - 28   Sodium 141 134 - 144 mmol/L   Potassium 4.5 3.5 - 5.2 mmol/L   Chloride 99 96 - 106 mmol/L   CO2 24 18 - 29 mmol/L   Calcium 8.8 8.7 - 10.3 mg/dL   Total Protein 6.7 6.0 - 8.5 g/dL   Albumin 4.5 3.6 - 4.8 g/dL   Globulin, Total 2.2 1.5 - 4.5 g/dL   Albumin/Globulin Ratio 2.0 1.2 - 2.2   Bilirubin Total 0.4 0.0 - 1.2 mg/dL   Alkaline Phosphatase 52 39 - 117 IU/L   AST 44 (H) 0 - 40 IU/L   ALT 35 (H) 0 - 32 IU/L  Lipid panel  Result Value Ref Range   Cholesterol, Total 166 100 - 199 mg/dL   Triglycerides 186 (H) 0 - 149 mg/dL   HDL 42 >39 mg/dL   VLDL Cholesterol Cal 37 5 - 40 mg/dL   LDL  Calculated 87 0 - 99 mg/dL   Chol/HDL Ratio 4.0 0.0 - 4.4 ratio units  T4 AND TSH  Result Value Ref Range   TSH 2.120 0.450 - 4.500 uIU/mL   T4, Total 13.9 (H) 4.5 - 12.0 ug/dL     Meds ordered this encounter  Medications  . ACCU-CHEK FASTCLIX LANCETS MISC    Sig: Use to check Blood Sugars twice daily.    Dispense:  102 each    Refill:  3  . glucose blood (ACCU-CHEK GUIDE) test strip    Sig: Use as instructed    Dispense:  100 each    Refill:  3  . valsartan (DIOVAN) 160 MG tablet    Sig: Take 1 tablet (160 mg total) by mouth daily. For Blood pressure    Dispense:  90 tablet    Refill:  3  . meloxicam (MOBIC) 15 MG tablet    Sig: Take 1 tablet (15 mg total) by mouth daily.    Dispense:  30 tablet    Refill:  1  . Insulin Glargine (LANTUS SOLOSTAR) 100 UNIT/ML Solostar Pen    Sig: Inject 110 Units into the skin every morning.    Dispense:  11 pen    Refill:  PRN     Follow-up: Return in about 3 months (around 02/08/2017).  Claretta Fraise, M.D.

## 2016-11-10 LAB — SPECIMEN STATUS REPORT

## 2016-11-10 LAB — T4, FREE: Free T4: 1.88 ng/dL — ABNORMAL HIGH (ref 0.82–1.77)

## 2016-11-12 ENCOUNTER — Encounter: Payer: Self-pay | Admitting: Physical Therapy

## 2016-11-12 ENCOUNTER — Ambulatory Visit: Payer: 59 | Admitting: Physical Therapy

## 2016-11-12 DIAGNOSIS — M25562 Pain in left knee: Secondary | ICD-10-CM | POA: Diagnosis not present

## 2016-11-12 NOTE — Therapy (Signed)
[facility] [place], [place], [address] Phone: [phone]   Fax:  [phone]  Physical Therapy Treatment  Patient Details  Name: Morgan Schmidt MRN: [id number] Date of Birth: 1951/01/28 Referring Provider: Paralee Cancel MD.  Encounter Date: 11/12/2016      PT End of Session - 11/12/16 1351    Visit Number 2   Number of Visits 12   Date for PT Re-Evaluation 12/05/16   PT Start Time 1319   PT Stop Time 1406   PT Time Calculation (min) 47 min   Activity Tolerance Patient tolerated treatment well   Behavior During Therapy [facility] for tasks assessed/performed      Past Medical History:  Diagnosis Date  . Arthritis   . Cancer ([place])    thyroid CA  . Diabetes mellitus    type 2  . Thyroid disease     Past Surgical History:  Procedure Laterality Date  . CESAREAN SECTION    . KNEE ARTHROSCOPY Left 10/22/2016   Procedure: ARTHROSCOPY LEFT KNEE WITH DEBRIDEMENT;  Surgeon: Paralee Cancel, MD;  Location: [facility];  Service: Orthopedics;  Laterality: Left;  . KNEE ARTHROSCOPY WITH MEDIAL MENISECTOMY  10/22/2016   Procedure: KNEE ARTHROSCOPY WITH MEDIAL MENISECTOMY;  Surgeon: Paralee Cancel, MD;  Location: [facility];  Service: Orthopedics;;  . KNEE ARTHROSCOPY WITH SUBCHONDROPLASTY Left 10/22/2016   Procedure: KNEE ARTHROSCOPY WITH MEDIAL CHONDROPLASTY;  Surgeon: Paralee Cancel, MD;  Location: [facility];  Service: Orthopedics;  Laterality: Left;  . THYROIDECTOMY  2009    There were no vitals filed for this visit.      Subjective Assessment - 11/12/16 1323    Subjective Patient doing well thus far ongoing soreness and doing exercises per MD. Alvan Dame   Limitations Standing   How long can you stand comfortably? 10-15 minutes.   Currently in Pain? Yes   Pain Score 3    Pain Location Knee   Pain Orientation Left   Pain Descriptors / Indicators Aching   Pain Type Acute pain   Pain  Onset 1 to 4 weeks ago   Pain Frequency Intermittent   Aggravating Factors  stairs   Pain Relieving Factors at rest                         [facility] Adult PT Treatment/Exercise - 11/12/16 0001      Exercises   Exercises Knee/Hip     Knee/Hip Exercises: Aerobic   Nustep L4 x 31min UE/LE     Knee/Hip Exercises: Standing   Terminal Knee Extension Strengthening;Left;2 sets;10 reps   Theraband Level (Terminal Knee Extension) Level 3 (Green)   Forward Step Up 2 sets;10 reps;Left;Step Height: 2"     Knee/Hip Exercises: Supine   Bridges Strengthening;Both;2 sets;10 reps   Straight Leg Raise with External Rotation Strengthening;Left;2 sets;10 reps   Other Supine Knee/Hip Exercises add/ball squeeze 2x10   Other Supine Knee/Hip Exercises hip abd with red t-band 2x10     Knee/Hip Exercises: Sidelying   Hip ABduction Strengthening;Left;2 sets;10 reps   Clams 2x10     Electrical Stimulation   Electrical Stimulation Location left knee.   Electrical Stimulation Action IFC 1-10hz  x21min   Electrical Stimulation Goals Edema;Pain     Vasopneumatic   Number Minutes Vasopneumatic  15 minutes   Vasopnuematic Location  Knee   Vasopneumatic Pressure Medium  PT Long Term Goals - 11/12/16 1352      PT LONG TERM GOAL #1   Title Independent with a HEP.   Time 4   Period Weeks   Status On-going     PT LONG TERM GOAL #2   Title Increase knee strength to a solid 5/5 to provide good stability for accomplishment of functional activities   Period Weeks   Status On-going     PT LONG TERM GOAL #3   Title Perform a reciprocating stair gait with one railing with pain not > 2-3/10.   Time 4   Period Weeks   Status On-going               Plan - 11/12/16 1353    Clinical Impression Statement Patient tolerated treatment well today. Patient able to perform all exercises pain free. Patient has most pain when going up or down stairs. Patient  goals progressing yet ongoing due to strength and pain defcits.   Rehab Potential Excellent   PT Frequency 3x / week   PT Treatment/Interventions ADLs/Self Care Home Management;Cryotherapy;Electrical Stimulation;Moist Heat;Ultrasound;Stair training;Patient/family education;Neuromuscular re-education;Therapeutic exercise;Therapeutic activities;Manual techniques;Passive range of motion;Vasopneumatic Device   PT Next Visit Plan cont with POC for PAIN-FREE left quad/ hip strengthening and progress as tolerated.  Modalites PRN   Consulted and Agree with Plan of Care Patient      Patient will benefit from skilled therapeutic intervention in order to improve the following deficits and impairments:  Pain, Decreased activity tolerance, Decreased strength  Visit Diagnosis: Acute pain of left knee     Problem List Patient Active Problem List   Diagnosis Date Noted  . Essential hypertension 03/23/2016  . Other specified hypothyroidism 12/16/2014  . Mixed diabetic hyperlipidemia associated with type 2 diabetes mellitus ([place]) 12/16/2014  . Long term current use of insulin ([place]) 12/16/2014  . Polyposis of colon 12/16/2014  . History of thyroid cancer 08/24/2011    Phillips Climes, PTA 11/12/2016, 2:13 PM  [facility] [place], [place], [address] Phone: [phone]   Fax:  [phone]  Name: Morgan Schmidt MRN: [id number] Date of Birth: [birth year]

## 2016-11-15 ENCOUNTER — Encounter: Payer: Self-pay | Admitting: Physical Therapy

## 2016-11-15 ENCOUNTER — Ambulatory Visit: Payer: 59 | Admitting: Physical Therapy

## 2016-11-15 DIAGNOSIS — M25562 Pain in left knee: Secondary | ICD-10-CM | POA: Diagnosis not present

## 2016-11-15 NOTE — Therapy (Signed)
[facility] [place], [place], [address] Phone: [phone]   Fax:  [phone]  Physical Therapy Treatment  Patient Details  Name: Morgan Schmidt MRN: [id number] Date of Birth: 1950/12/24 Referring Provider: Paralee Cancel MD.  Encounter Date: 11/15/2016      PT End of Session - 11/15/16 1334    Visit Number 3   Number of Visits 12   Date for PT Re-Evaluation 12/05/16   PT Start Time 4650   PT Stop Time 1407   PT Time Calculation (min) 52 min   Activity Tolerance Patient tolerated treatment well   Behavior During Therapy [facility] for tasks assessed/performed      Past Medical History:  Diagnosis Date  . Arthritis   . Cancer ([place])    thyroid CA  . Diabetes mellitus    type 2  . Thyroid disease     Past Surgical History:  Procedure Laterality Date  . CESAREAN SECTION    . KNEE ARTHROSCOPY Left 10/22/2016   Procedure: ARTHROSCOPY LEFT KNEE WITH DEBRIDEMENT;  Surgeon: Paralee Cancel, MD;  Location: [facility];  Service: Orthopedics;  Laterality: Left;  . KNEE ARTHROSCOPY WITH MEDIAL MENISECTOMY  10/22/2016   Procedure: KNEE ARTHROSCOPY WITH MEDIAL MENISECTOMY;  Surgeon: Paralee Cancel, MD;  Location: [facility];  Service: Orthopedics;;  . KNEE ARTHROSCOPY WITH SUBCHONDROPLASTY Left 10/22/2016   Procedure: KNEE ARTHROSCOPY WITH MEDIAL CHONDROPLASTY;  Surgeon: Paralee Cancel, MD;  Location: [facility];  Service: Orthopedics;  Laterality: Left;  . THYROIDECTOMY  2009    There were no vitals filed for this visit.      Subjective Assessment - 11/15/16 1320    Subjective Patient reported doing well ofter last treatment    Limitations Standing   How long can you stand comfortably? 10-15 minutes.   Currently in Pain? Yes   Pain Score 3    Pain Location Knee   Pain Orientation Left   Pain Descriptors / Indicators Aching   Pain Type Acute pain   Pain Onset 1 to 4 weeks ago   Pain  Frequency Intermittent   Aggravating Factors  stairs   Pain Relieving Factors at rest                         [facility] Adult PT Treatment/Exercise - 11/15/16 0001      Knee/Hip Exercises: Aerobic   Nustep L4 x 59min UE/LE     Knee/Hip Exercises: Standing   Terminal Knee Extension Strengthening;Left;2 sets;10 reps   Theraband Level (Terminal Knee Extension) Level 3 (Green)   Lateral Step Up Left;2 sets;10 reps;Step Height: 4"   Forward Step Up Left;2 sets;10 reps;Step Height: 4"     Knee/Hip Exercises: Supine   Bridges Strengthening;Both;2 sets;10 reps   Straight Leg Raise with External Rotation Strengthening;Left;2 sets;10 reps   Other Supine Knee/Hip Exercises add/ball squeeze 2x10   Other Supine Knee/Hip Exercises hip abd with red t-band x30     Knee/Hip Exercises: Sidelying   Hip ABduction Strengthening;Left;2 sets;10 reps   Clams 2x10     Electrical Stimulation   Electrical Stimulation Location left knee.   Electrical Stimulation Action IFC 1-10hz  x54min   Electrical Stimulation Goals Edema;Pain     Vasopneumatic   Number Minutes Vasopneumatic  15 minutes   Vasopnuematic Location  Knee   Vasopneumatic Pressure Medium  PT Long Term Goals - 11/12/16 1352      PT LONG TERM GOAL #1   Title Independent with a HEP.   Time 4   Period Weeks   Status On-going     PT LONG TERM GOAL #2   Title Increase knee strength to a solid 5/5 to provide good stability for accomplishment of functional activities   Period Weeks   Status On-going     PT LONG TERM GOAL #3   Title Perform a reciprocating stair gait with one railing with pain not > 2-3/10.   Time 4   Period Weeks   Status On-going               Plan - 11/15/16 1337    Clinical Impression Statement Patient tolerated treatment well today. Patient able to complete all exercises pain free. Patient able to progress strengthening exercises with no difficulty. Patient  progressing toward goals yet ongoing due to strength and pain deficts.    Rehab Potential Excellent   PT Frequency 3x / week   PT Treatment/Interventions ADLs/Self Care Home Management;Cryotherapy;Electrical Stimulation;Moist Heat;Ultrasound;Stair training;Patient/family education;Neuromuscular re-education;Therapeutic exercise;Therapeutic activities;Manual techniques;Passive range of motion;Vasopneumatic Device   PT Next Visit Plan cont with POC for PAIN-FREE left quad/ hip strengthening and progress as tolerated.  Modalites PRN   Consulted and Agree with Plan of Care Patient      Patient will benefit from skilled therapeutic intervention in order to improve the following deficits and impairments:  Pain, Decreased activity tolerance, Decreased strength  Visit Diagnosis: Acute pain of left knee     Problem List Patient Active Problem List   Diagnosis Date Noted  . Essential hypertension 03/23/2016  . Other specified hypothyroidism 12/16/2014  . Mixed diabetic hyperlipidemia associated with type 2 diabetes mellitus ([place]) 12/16/2014  . Long term current use of insulin ([place]) 12/16/2014  . Polyposis of colon 12/16/2014  . History of thyroid cancer 08/24/2011    Phillips Climes, PTA 11/15/2016, 2:08 PM  [facility] [address] [place], [place], [address] Phone: [phone]   Fax:  [phone]  Name: Morgan Schmidt MRN: [id number] Date of Birth: 12/02/1950

## 2016-11-19 ENCOUNTER — Ambulatory Visit: Payer: 59 | Attending: Orthopedic Surgery | Admitting: Physical Therapy

## 2016-11-19 ENCOUNTER — Encounter: Payer: Self-pay | Admitting: Physical Therapy

## 2016-11-19 DIAGNOSIS — M25562 Pain in left knee: Secondary | ICD-10-CM | POA: Diagnosis not present

## 2016-11-19 NOTE — Therapy (Signed)
[facility] [place], [place], [address] Phone: [phone]   Fax:  [phone]  Physical Therapy Treatment  Patient Details  Name: Morgan Schmidt MRN: [id number] Date of Birth: [birth year] Referring Provider: Paralee Cancel MD.  Encounter Date: 11/19/2016      PT End of Session - 11/19/16 0850    Visit Number 4   Number of Visits 12   Date for PT Re-Evaluation 12/05/16   PT Start Time 0815   PT Stop Time 0905   PT Time Calculation (min) 50 min   Activity Tolerance Patient limited by pain   Behavior During Therapy [facility] for tasks assessed/performed      Past Medical History:  Diagnosis Date  . Arthritis   . Cancer ([place])    thyroid CA  . Diabetes mellitus    type 2  . Thyroid disease     Past Surgical History:  Procedure Laterality Date  . CESAREAN SECTION    . KNEE ARTHROSCOPY Left 10/22/2016   Procedure: ARTHROSCOPY LEFT KNEE WITH DEBRIDEMENT;  Surgeon: Paralee Cancel, MD;  Location: [facility];  Service: Orthopedics;  Laterality: Left;  . KNEE ARTHROSCOPY WITH MEDIAL MENISECTOMY  10/22/2016   Procedure: KNEE ARTHROSCOPY WITH MEDIAL MENISECTOMY;  Surgeon: Paralee Cancel, MD;  Location: [facility];  Service: Orthopedics;;  . KNEE ARTHROSCOPY WITH SUBCHONDROPLASTY Left 10/22/2016   Procedure: KNEE ARTHROSCOPY WITH MEDIAL CHONDROPLASTY;  Surgeon: Paralee Cancel, MD;  Location: [facility];  Service: Orthopedics;  Laterality: Left;  . THYROIDECTOMY  2009    There were no vitals filed for this visit.      Subjective Assessment - 11/19/16 0817    Subjective Pt reports more soreness after walking in the yard yesterday, still doing stairs 1 step at a time   Currently in Pain? Yes   Pain Score 6    Pain Location Knee   Pain Orientation Left   Pain Descriptors / Indicators Sore   Pain Type Acute pain   Pain Onset 1 to 4 weeks ago                         [facility]  Adult PT Treatment/Exercise - 11/19/16 0001      Knee/Hip Exercises: Aerobic   Nustep L4 x 10 min UE/LE     Knee/Hip Exercises: Standing   Terminal Knee Extension Strengthening;Left;2 sets;10 reps   Theraband Level (Terminal Knee Extension) Level 3 (Green)   Lateral Step Up --  unable to tolerate today due to pain   Forward Step Up Left;2 sets;10 reps;Hand Hold: 2;Step Height: 6"     Knee/Hip Exercises: Supine   Bridges Strengthening;Both;2 sets;10 reps   Straight Leg Raise with External Rotation Strengthening;Left;2 sets;10 reps   Other Supine Knee/Hip Exercises add/ball squeeze 5 sec hold x 10     Knee/Hip Exercises: Sidelying   Hip ABduction Strengthening;Left;2 sets;10 reps   Clams 2 x 10     Electrical Stimulation   Electrical Stimulation Location lt knee   Electrical Stimulation Action IFC 1-10 Hz x 15 mins   Electrical Stimulation Goals Edema;Pain     Vasopneumatic   Number Minutes Vasopneumatic  15 minutes   Vasopnuematic Location  Knee   Vasopneumatic Pressure Medium     Manual Therapy   Manual Therapy Soft tissue mobilization   Soft tissue mobilization STW and trigger point release Lt ITBand entire length to pt tolerance  PT Long Term Goals - 11/19/16 7078      PT LONG TERM GOAL #1   Title Independent with a HEP.   Status On-going     PT LONG TERM GOAL #2   Title Increase knee strength to a solid 5/5 to provide good stability for accomplishment of functional activities   Status On-going     PT LONG TERM GOAL #3   Title Perform a reciprocating stair gait with one railing with pain not > 2-3/10.   Status On-going               Plan - 11/19/16 6754    Clinical Impression Statement Pt limited today by increased Lt knee pain, unable to complete lateral step ups but able to perform supine exercises.  Significant tightness and trigger points in Lt ITBand, improved with manual therapy.   Rehab Potential Excellent   PT  Frequency 3x / week   PT Treatment/Interventions ADLs/Self Care Home Management;Cryotherapy;Electrical Stimulation;Moist Heat;Ultrasound;Stair training;Patient/family education;Neuromuscular re-education;Therapeutic exercise;Therapeutic activities;Manual techniques;Passive range of motion;Vasopneumatic Device   PT Next Visit Plan continue STW for Lt ITBand, progress strengthening and step up exercises   Consulted and Agree with Plan of Care Patient      Patient will benefit from skilled therapeutic intervention in order to improve the following deficits and impairments:  Pain, Decreased activity tolerance, Decreased strength  Visit Diagnosis: Acute pain of left knee     Problem List Patient Active Problem List   Diagnosis Date Noted  . Essential hypertension 03/23/2016  . Other specified hypothyroidism 12/16/2014  . Mixed diabetic hyperlipidemia associated with type 2 diabetes mellitus ([place]) 12/16/2014  . Long term current use of insulin ([place]) 12/16/2014  . Polyposis of colon 12/16/2014  . History of thyroid cancer 08/24/2011    Isabelle Course, PT, DPT 11/19/2016, 8:54 AM  [facility] [address] [place], [place], [address] Phone: [phone]   Fax:  [phone]  Name: LILYANN GRAVELLE MRN: [id number] Date of Birth: 01-15-1951

## 2016-11-21 ENCOUNTER — Encounter: Payer: Self-pay | Admitting: Physical Therapy

## 2016-11-21 ENCOUNTER — Ambulatory Visit: Payer: 59 | Admitting: Physical Therapy

## 2016-11-21 DIAGNOSIS — M25562 Pain in left knee: Secondary | ICD-10-CM | POA: Diagnosis not present

## 2016-11-21 NOTE — Therapy (Signed)
[facility] [place], [place], [address] Phone: [phone]   Fax:  [phone]  Physical Therapy Treatment  Patient Details  Name: Morgan Schmidt MRN: [id number] Date of Birth: [birth year] Referring Provider: Paralee Cancel MD.  Encounter Date: 11/21/2016      PT End of Session - 11/21/16 1249    Visit Number 5   Number of Visits 12   Date for PT Re-Evaluation 12/05/16   PT Start Time 1233   PT Stop Time 1317   PT Time Calculation (min) 44 min   Activity Tolerance Patient tolerated treatment well   Behavior During Therapy [facility] for tasks assessed/performed      Past Medical History:  Diagnosis Date  . Arthritis   . Cancer ([place])    thyroid CA  . Diabetes mellitus    type 2  . Thyroid disease     Past Surgical History:  Procedure Laterality Date  . CESAREAN SECTION    . KNEE ARTHROSCOPY Left 10/22/2016   Procedure: ARTHROSCOPY LEFT KNEE WITH DEBRIDEMENT;  Surgeon: Paralee Cancel, MD;  Location: [facility];  Service: Orthopedics;  Laterality: Left;  . KNEE ARTHROSCOPY WITH MEDIAL MENISECTOMY  10/22/2016   Procedure: KNEE ARTHROSCOPY WITH MEDIAL MENISECTOMY;  Surgeon: Paralee Cancel, MD;  Location: [facility];  Service: Orthopedics;;  . KNEE ARTHROSCOPY WITH SUBCHONDROPLASTY Left 10/22/2016   Procedure: KNEE ARTHROSCOPY WITH MEDIAL CHONDROPLASTY;  Surgeon: Paralee Cancel, MD;  Location: [facility];  Service: Orthopedics;  Laterality: Left;  . THYROIDECTOMY  2009    There were no vitals filed for this visit.      Subjective Assessment - 11/21/16 1232    Subjective Patient has no new complaints just ongoing pain with prolong walking   Limitations Standing   How long can you stand comfortably? 10-15 minutes.   Currently in Pain? Yes   Pain Score 6    Pain Location Knee   Pain Orientation Left   Pain Descriptors / Indicators Sore   Pain Type Acute pain   Pain Onset 1 to 4  weeks ago   Pain Frequency Intermittent   Aggravating Factors  prolong walking   Pain Relieving Factors at rest                         [facility] Adult PT Treatment/Exercise - 11/21/16 0001      Knee/Hip Exercises: Aerobic   Nustep L4 x 10 min UE/LE     Knee/Hip Exercises: Standing   Terminal Knee Extension Strengthening;Left;2 sets;10 reps;3 sets   Theraband Level (Terminal Knee Extension) Other (comment)  pink XTS   Forward Step Up Left;2 sets;10 reps;Hand Hold: 2;Step Height: 6"     Knee/Hip Exercises: Supine   Short Arc Quad Sets Strengthening;Left;2 sets;10 reps  2#   Bridges Strengthening;Both;2 sets;10 reps   Straight Leg Raise with External Rotation Strengthening;Left;2 sets;10 reps   Other Supine Knee/Hip Exercises add/ball squeeze 5 sec hold x 10     Knee/Hip Exercises: Sidelying   Hip ABduction Strengthening;Left;2 sets;10 reps   Clams 2 x 10     Electrical Stimulation   Electrical Stimulation Location lt knee   Electrical Stimulation Action IFC   Electrical Stimulation Parameters 1-10hz  x79min   Electrical Stimulation Goals Edema;Pain     Vasopneumatic   Number Minutes Vasopneumatic  15 minutes   Vasopnuematic Location  Knee   Vasopneumatic Pressure Medium  PT Long Term Goals - 11/21/16 1250      PT LONG TERM GOAL #1   Title Independent with a HEP.   Time 4   Period Weeks   Status On-going     PT LONG TERM GOAL #2   Title Increase knee strength to a solid 5/5 to provide good stability for accomplishment of functional activities   Time 4   Period Weeks   Status On-going     PT LONG TERM GOAL #3   Title Perform a reciprocating stair gait with one railing with pain not > 2-3/10.   Time 4   Period Weeks   Status On-going               Plan - 11/21/16 1253    Clinical Impression Statement Patient tolerated treatment well today. Patient able to complete all exercises with no complaints. Patient  feels less pain overall yet pain will increase with prolong activity or walking. Patient progressing toward goals yet ongoing due to pan deficts.   Rehab Potential Excellent   PT Frequency 3x / week   PT Treatment/Interventions ADLs/Self Care Home Management;Cryotherapy;Electrical Stimulation;Moist Heat;Ultrasound;Stair training;Patient/family education;Neuromuscular re-education;Therapeutic exercise;Therapeutic activities;Manual techniques;Passive range of motion;Vasopneumatic Device   PT Next Visit Plan continue STW for Lt ITBand, progress strengthening and step up exercises   Consulted and Agree with Plan of Care Patient      Patient will benefit from skilled therapeutic intervention in order to improve the following deficits and impairments:  Pain, Decreased activity tolerance, Decreased strength  Visit Diagnosis: Acute pain of left knee     Problem List Patient Active Problem List   Diagnosis Date Noted  . Essential hypertension 03/23/2016  . Other specified hypothyroidism 12/16/2014  . Mixed diabetic hyperlipidemia associated with type 2 diabetes mellitus ([place]) 12/16/2014  . Long term current use of insulin ([place]) 12/16/2014  . Polyposis of colon 12/16/2014  . History of thyroid cancer 08/24/2011    Phillips Climes, PTA 11/21/2016, 1:17 PM  [facility] [address] [place], [place], [address] Phone: [phone]   Fax:  [phone]  Name: Morgan Schmidt MRN: [id number] Date of Birth: Jan 31, 1951

## 2016-11-22 MED FILL — LANTUS SOLOSTAR 100 UNITS/M: 100 | 28 days supply | Qty: 30 | Fill #3

## 2016-11-22 MED FILL — TRULICITY 1.5 MG/0.5 ML PEN: 1.5 | 28 days supply | Qty: 2 | Fill #8

## 2016-11-26 ENCOUNTER — Ambulatory Visit: Payer: 59 | Admitting: Physical Therapy

## 2016-11-26 ENCOUNTER — Encounter: Payer: Self-pay | Admitting: Physical Therapy

## 2016-11-26 DIAGNOSIS — M25562 Pain in left knee: Secondary | ICD-10-CM | POA: Diagnosis not present

## 2016-11-26 NOTE — Therapy (Signed)
[facility] [place], [place], [address] Phone: [phone]   Fax:  [phone]  Physical Therapy Treatment  Patient Details  Name: Morgan Schmidt MRN: [id number] Date of Birth: 1950/09/04 Referring Provider: Paralee Cancel MD.  Encounter Date: 11/26/2016      PT End of Session - 11/26/16 1346    Visit Number 6   Number of Visits 12   Date for PT Re-Evaluation 12/05/16   PT Start Time 4097   PT Stop Time 1405   PT Time Calculation (min) 48 min   Activity Tolerance Patient tolerated treatment well   Behavior During Therapy [facility] for tasks assessed/performed      Past Medical History:  Diagnosis Date  . Arthritis   . Cancer ([place])    thyroid CA  . Diabetes mellitus    type 2  . Thyroid disease     Past Surgical History:  Procedure Laterality Date  . CESAREAN SECTION    . KNEE ARTHROSCOPY Left 10/22/2016   Procedure: ARTHROSCOPY LEFT KNEE WITH DEBRIDEMENT;  Surgeon: Paralee Cancel, MD;  Location: [facility];  Service: Orthopedics;  Laterality: Left;  . KNEE ARTHROSCOPY WITH MEDIAL MENISECTOMY  10/22/2016   Procedure: KNEE ARTHROSCOPY WITH MEDIAL MENISECTOMY;  Surgeon: Paralee Cancel, MD;  Location: [facility];  Service: Orthopedics;;  . KNEE ARTHROSCOPY WITH SUBCHONDROPLASTY Left 10/22/2016   Procedure: KNEE ARTHROSCOPY WITH MEDIAL CHONDROPLASTY;  Surgeon: Paralee Cancel, MD;  Location: [facility];  Service: Orthopedics;  Laterality: Left;  . THYROIDECTOMY  2009    There were no vitals filed for this visit.      Subjective Assessment - 11/26/16 1323    Subjective Patient reported feeling less pain overall   Limitations Standing   How long can you stand comfortably? 10-15 minutes.   Currently in Pain? Yes   Pain Score 3    Pain Location Knee   Pain Orientation Left   Pain Descriptors / Indicators Sore   Pain Type Acute pain   Pain Onset 1 to 4 weeks ago   Pain Frequency  Intermittent   Aggravating Factors  prolong walking   Pain Relieving Factors at rest                         [facility] Adult PT Treatment/Exercise - 11/26/16 0001      Knee/Hip Exercises: Aerobic   Nustep L4 x 10 min UE/LE     Knee/Hip Exercises: Standing   Terminal Knee Extension Strengthening;Left;2 sets;10 reps;3 sets   Theraband Level (Terminal Knee Extension) Other (comment)  pink XTS   Forward Step Up Left;2 sets;10 reps;Hand Hold: 2;Step Height: 6"     Knee/Hip Exercises: Supine   Short Arc Quad Sets Strengthening;Left;2 sets;10 reps  with 2# add squeeze   Bridges Strengthening;Both;2 sets;10 reps  with red t-band hip abd   Straight Leg Raise with External Rotation Strengthening;Left;2 sets;10 reps   Other Supine Knee/Hip Exercises add/ball squeeze 5 sec hold x 10     Knee/Hip Exercises: Sidelying   Hip ABduction Strengthening;Left;2 sets;10 reps   Clams 2 x 10     Electrical Stimulation   Electrical Stimulation Location lt knee   Electrical Stimulation Action IFC   Electrical Stimulation Parameters 1-10hz  x75min   Electrical Stimulation Goals Edema;Pain     Vasopneumatic   Number Minutes Vasopneumatic  15 minutes   Vasopnuematic Location  Knee   Vasopneumatic Pressure Medium  PT Long Term Goals - 11/21/16 1250      PT LONG TERM GOAL #1   Title Independent with a HEP.   Time 4   Period Weeks   Status On-going     PT LONG TERM GOAL #2   Title Increase knee strength to a solid 5/5 to provide good stability for accomplishment of functional activities   Time 4   Period Weeks   Status On-going     PT LONG TERM GOAL #3   Title Perform a reciprocating stair gait with one railing with pain not > 2-3/10.   Time 4   Period Weeks   Status On-going               Plan - 11/26/16 1348    Clinical Impression Statement Patient tolerated treatment well today and able to complete all exercises with no pain  increase. Patient feels overall better than she has and has had decreased pain in left knee. Patient able to progress slowly with left knee strengthening. Goals ongoing due to pain and strength deficits.   Rehab Potential Excellent   PT Frequency 3x / week   PT Treatment/Interventions ADLs/Self Care Home Management;Cryotherapy;Electrical Stimulation;Moist Heat;Ultrasound;Stair training;Patient/family education;Neuromuscular re-education;Therapeutic exercise;Therapeutic activities;Manual techniques;Passive range of motion;Vasopneumatic Device   PT Next Visit Plan continue with POC to progress strengthening and step up exercises per patient tolerance (pain free) (MD. Alvan Dame 12/05/16)   Consulted and Agree with Plan of Care Patient      Patient will benefit from skilled therapeutic intervention in order to improve the following deficits and impairments:  Pain, Decreased activity tolerance, Decreased strength  Visit Diagnosis: Acute pain of left knee     Problem List Patient Active Problem List   Diagnosis Date Noted  . Essential hypertension 03/23/2016  . Other specified hypothyroidism 12/16/2014  . Mixed diabetic hyperlipidemia associated with type 2 diabetes mellitus ([place]) 12/16/2014  . Long term current use of insulin ([place]) 12/16/2014  . Polyposis of colon 12/16/2014  . History of thyroid cancer 08/24/2011    Phillips Climes, PTA 11/26/2016, 2:05 PM  [facility] [address] [place], [place], [address] Phone: [phone]   Fax:  [phone]  Name: Morgan Schmidt MRN: [id number] Date of Birth: 12-29-1950

## 2016-11-28 ENCOUNTER — Ambulatory Visit: Payer: 59 | Admitting: Physical Therapy

## 2016-11-28 DIAGNOSIS — M25562 Pain in left knee: Secondary | ICD-10-CM

## 2016-11-28 NOTE — Therapy (Signed)
[facility] [place], [place], [address] Phone: [phone]   Fax:  [phone]  Physical Therapy Treatment  Patient Details  Name: Morgan Schmidt MRN: [id number] Date of Birth: 04-02-1951 Referring Provider: Paralee Cancel MD.  Encounter Date: 11/28/2016      PT End of Session - 11/28/16 1402    Visit Number 7   Number of Visits 12   Date for PT Re-Evaluation 12/05/16   PT Start Time 0100   PT Stop Time 0154   PT Time Calculation (min) 54 min   Activity Tolerance Patient tolerated treatment well   Behavior During Therapy [facility] for tasks assessed/performed      Past Medical History:  Diagnosis Date  . Arthritis   . Cancer ([place])    thyroid CA  . Diabetes mellitus    type 2  . Thyroid disease     Past Surgical History:  Procedure Laterality Date  . CESAREAN SECTION    . KNEE ARTHROSCOPY Left 10/22/2016   Procedure: ARTHROSCOPY LEFT KNEE WITH DEBRIDEMENT;  Surgeon: Paralee Cancel, MD;  Location: [facility];  Service: Orthopedics;  Laterality: Left;  . KNEE ARTHROSCOPY WITH MEDIAL MENISECTOMY  10/22/2016   Procedure: KNEE ARTHROSCOPY WITH MEDIAL MENISECTOMY;  Surgeon: Paralee Cancel, MD;  Location: [facility];  Service: Orthopedics;;  . KNEE ARTHROSCOPY WITH SUBCHONDROPLASTY Left 10/22/2016   Procedure: KNEE ARTHROSCOPY WITH MEDIAL CHONDROPLASTY;  Surgeon: Paralee Cancel, MD;  Location: [facility];  Service: Orthopedics;  Laterality: Left;  . THYROIDECTOMY  2009    There were no vitals filed for this visit.      Subjective Assessment - 11/28/16 1403    Subjective I'm sore today.   Pain Score 4                          [facility] Adult PT Treatment/Exercise - 11/28/16 0001      Exercises   Exercises Knee/Hip     Knee/Hip Exercises: Aerobic   Stationary Bike level 1 x 15 minutes.     Knee/Hip Exercises: Machines for Strengthening   Cybex Knee Extension 10#  x 29minutes.     Knee/Hip Exercises: Supine   Short Arc Quad Sets Limitations 5 minutes with 5#     Modalities   Modalities Location manager Stimulation Location Left knee.   Electrical Stimulation Action IFC   Electrical Stimulation Parameters 80-150 Hz x 20 minues.     Vasopneumatic   Number Minutes Vasopneumatic  20 minutes   Vasopnuematic Location  --  Left knee knee.   Vasopneumatic Pressure Medium                     PT Long Term Goals - 11/21/16 1250      PT LONG TERM GOAL #1   Title Independent with a HEP.   Time 4   Period Weeks   Status On-going     PT LONG TERM GOAL #2   Title Increase knee strength to a solid 5/5 to provide good stability for accomplishment of functional activities   Time 4   Period Weeks   Status On-going     PT LONG TERM GOAL #3   Title Perform a reciprocating stair gait with one railing with pain not > 2-3/10.   Time 4   Period Weeks   Status On-going  Plan - 11/28/16 1422    Clinical Impression Statement Patient sore today but did well with resisted exercise.      Patient will benefit from skilled therapeutic intervention in order to improve the following deficits and impairments:     Visit Diagnosis: Acute pain of left knee     Problem List Patient Active Problem List   Diagnosis Date Noted  . Essential hypertension 03/23/2016  . Other specified hypothyroidism 12/16/2014  . Mixed diabetic hyperlipidemia associated with type 2 diabetes mellitus ([place]) 12/16/2014  . Long term current use of insulin ([place]) 12/16/2014  . Polyposis of colon 12/16/2014  . History of thyroid cancer 08/24/2011    APPLEGATE, [place] MPT 11/28/2016, 2:23 PM  [facility] [address] [place], [place], [address] Phone: [phone]   Fax:  [phone]  Name: Morgan Schmidt MRN: [id number] Date of Birth:  1951/06/11

## 2016-12-03 ENCOUNTER — Ambulatory Visit: Payer: 59 | Admitting: Physical Therapy

## 2016-12-03 ENCOUNTER — Other Ambulatory Visit: Payer: Self-pay | Admitting: Family Medicine

## 2016-12-03 DIAGNOSIS — M25562 Pain in left knee: Secondary | ICD-10-CM | POA: Diagnosis not present

## 2016-12-03 MED FILL — metFORMIN HCL 1000 MG TABS: 1000 | 90 days supply | Qty: 180 | Fill #0

## 2016-12-03 MED FILL — FENOFIBRIC ACID DR 135 MG C: 135 | 30 days supply | Qty: 30 | Fill #0

## 2016-12-03 MED FILL — EZETIMIBE 10 MG TAB: 10 | 90 days supply | Qty: 90 | Fill #0

## 2016-12-03 NOTE — Therapy (Signed)
[facility] [place], [place], [address] Phone: [phone]   Fax:  [phone]  Physical Therapy Treatment  Patient Details  Name: Morgan Schmidt MRN: [id number] Date of Birth: [birth year] Referring Provider: Paralee Cancel MD.  Encounter Date: 12/03/2016      PT End of Session - 12/03/16 1303    Visit Number 8   Number of Visits 12   Date for PT Re-Evaluation 12/05/16   PT Start Time 1300   PT Stop Time 1359   PT Time Calculation (min) 59 min   Activity Tolerance Patient tolerated treatment well   Behavior During Therapy [facility] for tasks assessed/performed      Past Medical History:  Diagnosis Date  . Arthritis   . Cancer ([place])    thyroid CA  . Diabetes mellitus    type 2  . Thyroid disease     Past Surgical History:  Procedure Laterality Date  . CESAREAN SECTION    . KNEE ARTHROSCOPY Left 10/22/2016   Procedure: ARTHROSCOPY LEFT KNEE WITH DEBRIDEMENT;  Surgeon: Paralee Cancel, MD;  Location: [facility];  Service: Orthopedics;  Laterality: Left;  . KNEE ARTHROSCOPY WITH MEDIAL MENISECTOMY  10/22/2016   Procedure: KNEE ARTHROSCOPY WITH MEDIAL MENISECTOMY;  Surgeon: Paralee Cancel, MD;  Location: [facility];  Service: Orthopedics;;  . KNEE ARTHROSCOPY WITH SUBCHONDROPLASTY Left 10/22/2016   Procedure: KNEE ARTHROSCOPY WITH MEDIAL CHONDROPLASTY;  Surgeon: Paralee Cancel, MD;  Location: [facility];  Service: Orthopedics;  Laterality: Left;  . THYROIDECTOMY  2009    There were no vitals filed for this visit.      Subjective Assessment - 12/03/16 1303    Subjective Patient states she is doing very well overall. She feels like the knee wants to give way sometimes when she steps backward or to the side.   Currently in Pain? Yes   Pain Score 2    Pain Location Knee   Pain Orientation Left   Pain Descriptors / Indicators Sore            [facility] PT Assessment - 12/03/16 0001       Strength   Overall Strength Comments L knee ext 5/5; flex 4+to 5-/5                     [facility] Adult PT Treatment/Exercise - 12/03/16 0001      Ambulation/Gait   Ambulation/Gait Yes   Ambulation/Gait Assistance 7: Independent   Ambulation Distance (Feet) 300 Feet   Assistive device None   Gait Pattern Decreased stance time - left;Decreased step length - right;Decreased weight shift to left   Ambulation Surface Level   Gait Comments utilized mirror to help patient normalize gait     Knee/Hip Exercises: Aerobic   Nustep L4 x 10 min     Knee/Hip Exercises: Standing   Heel Raises 2 sets;10 reps  10 straight and toes out   Lateral Step Up Left;2 sets;10 reps;Step Height: 6";Hand Hold: 2   Step Down Left;10 reps;Hand Hold: 2;Step Height: 4";Step Height: 6";3 sets  1 set on 4; 2 sets on 6 nch   Step Down Limitations VCs to not assist too much with UEs   Wall Squat 2 sets;10 reps   Rocker Board Other (comment)  no problem balancing without UE Support   SLS multiple trials of SLS      Modalities   Modalities Electrical Stimulation;Vasopneumatic     Electrical Stimulation  Electrical Stimulation Location L knee   Electrical Stimulation Action IFC   Electrical Stimulation Parameters 80-150 Hz x 15 min     Vasopneumatic   Number Minutes Vasopneumatic  15 minutes   Vasopnuematic Location  Knee   Vasopneumatic Pressure Medium   Vasopneumatic Temperature  34                PT Education - 12/03/16 1440    Education provided Yes   Education Details practice gait with large stride lengths; SLS at counter top   Person(s) Educated Patient   Methods Explanation;Demonstration   Comprehension Returned demonstration;Verbalized understanding             PT Long Term Goals - 12/03/16 1324      PT LONG TERM GOAL #1   Title Independent with a HEP.   Time 4   Period Weeks   Status On-going     PT LONG TERM GOAL #2   Title Increase knee strength to a  solid 5/5 to provide good stability for accomplishment of functional activities   Baseline L knee ext 5/5; flex 4+ to 5-/5 as of 12/03/16   Period Weeks   Status On-going     PT LONG TERM GOAL #3   Title Perform a reciprocating stair gait with one railing with pain not > 2-3/10.   Baseline poor eccentric control on L with descending; ascending WNL; pain 4-5/10 with pain   Time 4   Period Weeks   Status On-going               Plan - 12/03/16 1445    Clinical Impression Statement Patient is progressing well toward goals overall. She demos decreased stance time on L with gait, but is able to correct with verbal and visual cues. She has weak eccentric quad weakness with step downs and therefore uses step to gait descending stairs. She continues to have pain to 4-5/10 with ADLS including stairs.   Rehab Potential Excellent   PT Frequency 3x / week   PT Duration 4 weeks   PT Treatment/Interventions ADLs/Self Care Home Management;Cryotherapy;Electrical Stimulation;Moist Heat;Ultrasound;Stair training;Patient/family education;Neuromuscular re-education;Therapeutic exercise;Therapeutic activities;Manual techniques;Passive range of motion;Vasopneumatic Device   PT Next Visit Plan continue with POC to progress eccentric quad strengthening, balance and step up exercises per patient tolerance (pain free)   PT Home Exercise Plan SLS   Consulted and Agree with Plan of Care Patient      Patient will benefit from skilled therapeutic intervention in order to improve the following deficits and impairments:  Pain, Decreased activity tolerance, Decreased strength  Visit Diagnosis: Acute pain of left knee     Problem List Patient Active Problem List   Diagnosis Date Noted  . Essential hypertension 03/23/2016  . Other specified hypothyroidism 12/16/2014  . Mixed diabetic hyperlipidemia associated with type 2 diabetes mellitus ([place]) 12/16/2014  . Long term current use of insulin ([place])  12/16/2014  . Polyposis of colon 12/16/2014  . History of thyroid cancer 08/24/2011    Madelyn Flavors PT 12/03/2016, 2:51 PM  [facility] [address] [place], [place], [address] Phone: [phone]   Fax:  [phone]  Name: KALENA MANDER MRN: [id number] Date of Birth: [birth year]

## 2016-12-05 ENCOUNTER — Encounter: Payer: 59 | Admitting: Physical Therapy

## 2016-12-06 ENCOUNTER — Ambulatory Visit: Payer: 59 | Admitting: Physical Therapy

## 2016-12-06 DIAGNOSIS — M25562 Pain in left knee: Secondary | ICD-10-CM | POA: Diagnosis not present

## 2016-12-06 NOTE — Therapy (Signed)
[facility] [place], [place], [address] Phone: [phone]   Fax:  [phone]  Physical Therapy Treatment  Patient Details  Name: Morgan Schmidt MRN: [id number] Date of Birth: September 10, 1950 Referring Provider: Paralee Cancel MD.  Encounter Date: 12/06/2016      PT End of Session - 12/06/16 1455    Visit Number 9   Number of Visits 12   Date for PT Re-Evaluation 12/05/16   PT Start Time 0100   PT Stop Time 0152   PT Time Calculation (min) 52 min   Activity Tolerance Patient tolerated treatment well   Behavior During Therapy [facility] for tasks assessed/performed      Past Medical History:  Diagnosis Date  . Arthritis   . Cancer ([place])    thyroid CA  . Diabetes mellitus    type 2  . Thyroid disease     Past Surgical History:  Procedure Laterality Date  . CESAREAN SECTION    . KNEE ARTHROSCOPY Left 10/22/2016   Procedure: ARTHROSCOPY LEFT KNEE WITH DEBRIDEMENT;  Surgeon: Paralee Cancel, MD;  [facility];  Service: Orthopedics;  Laterality: Left;  . KNEE ARTHROSCOPY WITH MEDIAL MENISECTOMY  10/22/2016   Procedure: KNEE ARTHROSCOPY WITH MEDIAL MENISECTOMY;  Surgeon: Paralee Cancel, MD;  Location: [facility];  Service: Orthopedics;;  . KNEE ARTHROSCOPY WITH SUBCHONDROPLASTY Left 10/22/2016   Procedure: KNEE ARTHROSCOPY WITH MEDIAL CHONDROPLASTY;  Surgeon: Paralee Cancel, MD;  Location: [facility];  Service: Orthopedics;  Laterality: Left;  . THYROIDECTOMY  2009    There were no vitals filed for this visit.      Subjective Assessment - 12/06/16 1440    Subjective My knees feels better.   Pain Score 1    Pain Location Knee   Pain Orientation Left   Pain Descriptors / Indicators Sore   Pain Type Acute pain   Pain Onset 1 to 4 weeks ago                         [facility] Adult PT Treatment/Exercise - 12/06/16 0001      Knee/Hip Exercises: Aerobic    Stationary Bike Level 2 x 15 minutes.     Knee/Hip Exercises: Machines for Strengthening   Cybex Knee Extension 10# x 5 minutes.   Cybex Leg Press 2 plates x 5 minutes.     Modalities   Modalities Location manager Stimulation Location Left knee.   Electrical Stimulation Action IFC   Electrical Stimulation Parameters 80-150 Hz x 15 minutes.     Vasopneumatic   Number Minutes Vasopneumatic  15 minutes   Vasopnuematic Location  --  Left knee.   Vasopneumatic Pressure Medium                     PT Long Term Goals - 12/03/16 1324      PT LONG TERM GOAL #1   Title Independent with a HEP.   Time 4   Period Weeks   Status On-going     PT LONG TERM GOAL #2   Title Increase knee strength to a solid 5/5 to provide good stability for accomplishment of functional activities   Baseline L knee ext 5/5; flex 4+ to 5-/5 as of 12/03/16   Period Weeks   Status On-going     PT LONG TERM GOAL #3   Title Perform a reciprocating stair  gait with one railing with pain not > 2-3/10.   Baseline poor eccentric control on L with descending; ascending WNL; pain 4-5/10 with pain   Time 4   Period Weeks   Status On-going               Plan - 12/06/16 1456    Clinical Impression Statement Good job today with the addition of the leg press.  No pain after treatment today.      Patient will benefit from skilled therapeutic intervention in order to improve the following deficits and impairments:     Visit Diagnosis: Acute pain of left knee     Problem List Patient Active Problem List   Diagnosis Date Noted  . Essential hypertension 03/23/2016  . Other specified hypothyroidism 12/16/2014  . Mixed diabetic hyperlipidemia associated with type 2 diabetes mellitus ([place]) 12/16/2014  . Long term current use of insulin ([place]) 12/16/2014  . Polyposis of colon 12/16/2014  . History of thyroid cancer 08/24/2011     Selam Pietsch, [place] MPT 12/06/2016, 3:04 PM  [facility] [address] [place], [place], [address] Phone: [phone]   Fax:  [phone]  Name: Morgan Schmidt MRN: [id number] Date of Birth: April 13, 1951

## 2016-12-07 LAB — HM DIABETES EYE EXAM

## 2016-12-09 DIAGNOSIS — H5213 Myopia, bilateral: Secondary | ICD-10-CM | POA: Diagnosis not present

## 2016-12-11 ENCOUNTER — Ambulatory Visit: Payer: 59 | Admitting: Physical Therapy

## 2016-12-11 DIAGNOSIS — M25562 Pain in left knee: Secondary | ICD-10-CM | POA: Diagnosis not present

## 2016-12-11 NOTE — Therapy (Signed)
[facility] [place], [place], [address] Phone: [phone]   Fax:  [phone]  Physical Therapy Treatment  Patient Details  Name: Morgan Schmidt MRN: [id number] Date of Birth: January 25, 1951 Referring Provider: Paralee Cancel MD.  Encounter Date: 12/11/2016      PT End of Session - 12/11/16 1419    Visit Number 10   Number of Visits 12   Date for PT Re-Evaluation 12/05/16   PT Start Time 1340   PT Stop Time 1433   PT Time Calculation (min) 53 min   Activity Tolerance Patient tolerated treatment well   Behavior During Therapy [facility] for tasks assessed/performed      Past Medical History:  Diagnosis Date  . Arthritis   . Cancer ([place])    thyroid CA  . Diabetes mellitus    type 2  . Thyroid disease     Past Surgical History:  Procedure Laterality Date  . CESAREAN SECTION    . KNEE ARTHROSCOPY Left 10/22/2016   Procedure: ARTHROSCOPY LEFT KNEE WITH DEBRIDEMENT;  Surgeon: Paralee Cancel, MD;  Location: [facility];  Service: Orthopedics;  Laterality: Left;  . KNEE ARTHROSCOPY WITH MEDIAL MENISECTOMY  10/22/2016   Procedure: KNEE ARTHROSCOPY WITH MEDIAL MENISECTOMY;  Surgeon: Paralee Cancel, MD;  Location: [facility];  Service: Orthopedics;;  . KNEE ARTHROSCOPY WITH SUBCHONDROPLASTY Left 10/22/2016   Procedure: KNEE ARTHROSCOPY WITH MEDIAL CHONDROPLASTY;  Surgeon: Paralee Cancel, MD;  Location: [facility];  Service: Orthopedics;  Laterality: Left;  . THYROIDECTOMY  2009    There were no vitals filed for this visit.      Subjective Assessment - 12/11/16 1344    Subjective knee is feeling much better; numbness is finally improving.     Currently in Pain? Yes   Pain Score 2    Pain Location Knee   Pain Orientation Left   Pain Type Acute pain;Surgical pain   Pain Onset 1 to 4 weeks ago   Pain Frequency Intermittent   Aggravating Factors  walking, weather   Pain Relieving Factors  rest                         [facility] Adult PT Treatment/Exercise - 12/11/16 1345      Knee/Hip Exercises: Aerobic   Nustep L5 x 10 min     Knee/Hip Exercises: Machines for Strengthening   Cybex Knee Extension 10# 3x10; bil concentric; LLE only eccentric   Cybex Leg Press 1 plate LLE only (min A from RLE); 3x10     Knee/Hip Exercises: Standing   Forward Step Up Left;2 sets;10 reps;Step Height: 4"   Forward Step Up Limitations cues for slow eccentric control   Wall Squat 1 set;10 reps;3 seconds   Wall Squat Limitations with RLE on 2" block to promote LLE weight bearing, with green therband to maintain neutral hip     Electrical Stimulation   Electrical Stimulation Location Lt knee   Electrical Stimulation Action IFC   Electrical Stimulation Parameters to tolerance x 15 min   Electrical Stimulation Goals Edema;Pain     Vasopneumatic   Number Minutes Vasopneumatic  15 minutes   Vasopnuematic Location  Knee   Vasopneumatic Pressure Medium   Vasopneumatic Temperature  34                     PT Long Term Goals - 12/03/16 1324      PT LONG TERM  GOAL #1   Title Independent with a HEP.   Time 4   Period Weeks   Status On-going     PT LONG TERM GOAL #2   Title Increase knee strength to a solid 5/5 to provide good stability for accomplishment of functional activities   Baseline L knee ext 5/5; flex 4+ to 5-/5 as of 12/03/16   Period Weeks   Status On-going     PT LONG TERM GOAL #3   Title Perform a reciprocating stair gait with one railing with pain not > 2-3/10.   Baseline poor eccentric control on L with descending; ascending WNL; pain 4-5/10 with pain   Time 4   Period Weeks   Status On-going               Plan - 12/11/16 1420    Clinical Impression Statement Pt tolerated eccentric strengthening well today with c/o muscle fatigue following.  Progressing well towards goals.     PT Next Visit Plan eccentric quad strengthening, try stairs  weather permitting   Consulted and Agree with Plan of Care Patient      Patient will benefit from skilled therapeutic intervention in order to improve the following deficits and impairments:  Pain, Decreased activity tolerance, Decreased strength  Visit Diagnosis: Acute pain of left knee     Problem List Patient Active Problem List   Diagnosis Date Noted  . Essential hypertension 03/23/2016  . Other specified hypothyroidism 12/16/2014  . Mixed diabetic hyperlipidemia associated with type 2 diabetes mellitus ([place]) 12/16/2014  . Long term current use of insulin ([place]) 12/16/2014  . Polyposis of colon 12/16/2014  . History of thyroid cancer 08/24/2011      Laureen Abrahams, PT, DPT 12/11/16 2:22 PM    [facility] [address] [place], [place], [address] Phone: [phone]   Fax:  [phone]  Name: Morgan Schmidt MRN: [id number] Date of Birth: [birth year]

## 2016-12-13 ENCOUNTER — Ambulatory Visit: Payer: 59 | Admitting: *Deleted

## 2016-12-13 DIAGNOSIS — M25562 Pain in left knee: Secondary | ICD-10-CM

## 2016-12-13 NOTE — Therapy (Signed)
[facility] [place], [place], [address] Phone: [phone]   Fax:  [phone]  Physical Therapy Treatment  Patient Details  Name: Morgan Schmidt MRN: [id number] Date of Birth: 08-25-1950 Referring Provider: Paralee Cancel MD.  Encounter Date: 12/13/2016      PT End of Session - 12/13/16 1409    Visit Number 11   PT Start Time 1345      Past Medical History:  Diagnosis Date  . Arthritis   . Cancer ([place])    thyroid CA  . Diabetes mellitus    type 2  . Thyroid disease     Past Surgical History:  Procedure Laterality Date  . CESAREAN SECTION    . KNEE ARTHROSCOPY Left 10/22/2016   Procedure: ARTHROSCOPY LEFT KNEE WITH DEBRIDEMENT;  Surgeon: Paralee Cancel, MD;  Location: [facility];  Service: Orthopedics;  Laterality: Left;  . KNEE ARTHROSCOPY WITH MEDIAL MENISECTOMY  10/22/2016   Procedure: KNEE ARTHROSCOPY WITH MEDIAL MENISECTOMY;  Surgeon: Paralee Cancel, MD;  Location: [facility];  Service: Orthopedics;;  . KNEE ARTHROSCOPY WITH SUBCHONDROPLASTY Left 10/22/2016   Procedure: KNEE ARTHROSCOPY WITH MEDIAL CHONDROPLASTY;  Surgeon: Paralee Cancel, MD;  Location[facility];  Service: Orthopedics;  Laterality: Left;  . THYROIDECTOMY  2009    There were no vitals filed for this visit.                       [facility] Adult PT Treatment/Exercise - 12/13/16 0001      Knee/Hip Exercises: Aerobic   Nustep L5 x 10 min     Knee/Hip Exercises: Machines for Strengthening   Cybex Knee Extension 10# 4  x fatigue; bil concentric; LLE only eccentric   Cybex Leg Press 1 plate LLE only (min A from RLE); 3x10     Knee/Hip Exercises: Standing   Rocker Board 5 minutes  calf stretching and balnce     Modalities   Modalities Electrical Stimulation;Vasopneumatic     Acupuncturist Stimulation Location Lt knee  IFC x 15 mins   Electrical Stimulation Goals  Edema;Pain     Vasopneumatic   Number Minutes Vasopneumatic  15 minutes   Vasopnuematic Location  Knee   Vasopneumatic Pressure Medium   Vasopneumatic Temperature  34                     PT Long Term Goals - 12/03/16 1324      PT LONG TERM GOAL #1   Title Independent with a HEP.   Time 4   Period Weeks   Status On-going     PT LONG TERM GOAL #2   Title Increase knee strength to a solid 5/5 to provide good stability for accomplishment of functional activities   Baseline L knee ext 5/5; flex 4+ to 5-/5 as of 12/03/16   Period Weeks   Status On-going     PT LONG TERM GOAL #3   Title Perform a reciprocating stair gait with one railing with pain not > 2-3/10.   Baseline poor eccentric control on L with descending; ascending WNL; pain 4-5/10 with pain   Time 4   Period Weeks   Status On-going               Plan - 12/13/16 1418    Clinical Impression Statement Pt  did fairly well today with therex and act.'s for LT knee, but was challenged a  lot by SLS. Her ROM was 0-110 degrees with some pain at 110 degrees. Normal response to modalities.   Rehab Potential Excellent   PT Frequency 3x / week   PT Duration 4 weeks   PT Next Visit Plan eccentric quad strengthening, try stairs weather permitting    Pt was seen by Dr Alvan Dame 12-05-16 and He wanted her to cont 4 weeks of PT, Send Renewal   PT Home Exercise Plan SLS   Consulted and Agree with Plan of Care Patient      Patient will benefit from skilled therapeutic intervention in order to improve the following deficits and impairments:  Pain, Decreased activity tolerance, Decreased strength  Visit Diagnosis: Acute pain of left knee     Problem List Patient Active Problem List   Diagnosis Date Noted  . Essential hypertension 03/23/2016  . Other specified hypothyroidism 12/16/2014  . Mixed diabetic hyperlipidemia associated with type 2 diabetes mellitus ([place]) 12/16/2014  . Long term current use of insulin  ([place]) 12/16/2014  . Polyposis of colon 12/16/2014  . History of thyroid cancer 08/24/2011    RAMSEUR,CHRIS 12/13/2016, 2:56 PM  [facility] [address] [place], [place], [address] Phone: [phone]   Fax:  [phone]  Name: Morgan Schmidt MRN: [id number] Date of Birth: 07-04-1951

## 2016-12-17 MED FILL — NOVOFINE 30 NEEDLES: 30G X 8 MM | 90 days supply | Qty: 100 | Fill #1

## 2016-12-17 MED FILL — TRULICITY 1.5 MG/0.5 ML PEN: 1.5 | 28 days supply | Qty: 2 | Fill #9

## 2016-12-18 ENCOUNTER — Ambulatory Visit: Payer: 59 | Attending: Orthopedic Surgery | Admitting: Physical Therapy

## 2016-12-18 DIAGNOSIS — M25562 Pain in left knee: Secondary | ICD-10-CM | POA: Insufficient documentation

## 2016-12-18 NOTE — Therapy (Signed)
[facility] [place], [place], [address] Phone: [phone]   Fax:  [phone]  Physical Therapy Treatment  Patient Details  Name: Morgan Schmidt MRN: [id number] Date of Birth: Jul 07, 1951 Referring Provider: Paralee Cancel MD.  Encounter Date: 12/18/2016      PT End of Session - 12/18/16 1341    Visit Number 12   Number of Visits 20   Date for PT Re-Evaluation 01/16/17   PT Start Time 1301   PT Stop Time 1355   PT Time Calculation (min) 54 min   Activity Tolerance Patient tolerated treatment well   Behavior During Therapy [facility] for tasks assessed/performed      Past Medical History:  Diagnosis Date  . Arthritis   . Cancer ([place])    thyroid CA  . Diabetes mellitus    type 2  . Thyroid disease     Past Surgical History:  Procedure Laterality Date  . CESAREAN SECTION    . KNEE ARTHROSCOPY Left 10/22/2016   Procedure: ARTHROSCOPY LEFT KNEE WITH DEBRIDEMENT;  Surgeon: Paralee Cancel, MD;  Location: [facility];  Service: Orthopedics;  Laterality: Left;  . KNEE ARTHROSCOPY WITH MEDIAL MENISECTOMY  10/22/2016   Procedure: KNEE ARTHROSCOPY WITH MEDIAL MENISECTOMY;  Surgeon: Paralee Cancel, MD;  Location: [facility];  Service: Orthopedics;;  . KNEE ARTHROSCOPY WITH SUBCHONDROPLASTY Left 10/22/2016   Procedure: KNEE ARTHROSCOPY WITH MEDIAL CHONDROPLASTY;  Surgeon: Paralee Cancel, MD;  Location: [facility];  Service: Orthopedics;  Laterality: Left;  . THYROIDECTOMY  2009    There were no vitals filed for this visit.      Subjective Assessment - 12/18/16 1307    Subjective Knee is a little sore, "but not bad."     How long can you stand comfortably? 1 hour   Currently in Pain? Yes   Pain Score 2    Pain Location Knee   Pain Orientation Left   Pain Descriptors / Indicators Sore   Pain Type Acute pain;Surgical pain   Pain Onset 1 to 4 weeks ago   Pain Frequency Intermittent   Aggravating Factors  walking, weather   Pain Relieving Factors rest1 hour                         [facility] Adult PT Treatment/Exercise - 12/18/16 1309      Ambulation/Gait   Ramp Other (comment)  x 2 laps for eccentric quad control   Gait Comments negotiated up/down stairs outside x 5 reps: descending leading with RLE for LLE eccentric control; then cues to decrease circumduction and hip hike to advance LLE to ascend     Knee/Hip Exercises: Aerobic   Stationary Bike L2 x 10 min     Knee/Hip Exercises: Machines for Strengthening   Cybex Knee Extension 20# x10, 10# 2x10; focus on slow controlled eccentric lowering with LLE     Knee/Hip Exercises: Seated   Long Arc Quad Left;3 sets;10 reps;Weights   Long Arc Quad Weight 3 lbs.   Long [employer] Limitations focus on Engineer, agricultural Lt knee  IFC x 15 mins   Electrical Stimulation Goals Edema;Pain     Vasopneumatic   Number Minutes Vasopneumatic  15 minutes   Vasopnuematic Location  Knee   Vasopneumatic Pressure Medium   Vasopneumatic Temperature  34  PT Long Term Goals - 12/03/16 1324      PT LONG TERM GOAL #1   Title Independent with a HEP.   Time 4   Period Weeks   Status On-going     PT LONG TERM GOAL #2   Title Increase knee strength to a solid 5/5 to provide good stability for accomplishment of functional activities   Baseline L knee ext 5/5; flex 4+ to 5-/5 as of 12/03/16   Period Weeks   Status On-going     PT LONG TERM GOAL #3   Title Perform a reciprocating stair gait with one railing with pain not > 2-3/10.   Baseline poor eccentric control on L with descending; ascending WNL; pain 4-5/10 with pain   Time 4   Period Weeks   Status On-going               Plan - 12/18/16 1342    Clinical Impression Statement Continues to have difficulty with functional eccentric quad control of Lt knee, but  progressing well today.  Will continue to benefit from PT to maximize function and decrease pain.   PT Treatment/Interventions ADLs/Self Care Home Management;Cryotherapy;Electrical Stimulation;Moist Heat;Ultrasound;Stair training;Patient/family education;Neuromuscular re-education;Therapeutic exercise;Therapeutic activities;Manual techniques;Passive range of motion;Vasopneumatic Device   PT Next Visit Plan eccentric quad strengthening, functional activities   Consulted and Agree with Plan of Care Patient      Patient will benefit from skilled therapeutic intervention in order to improve the following deficits and impairments:  Pain, Decreased activity tolerance, Decreased strength  Visit Diagnosis: Acute pain of left knee     Problem List Patient Active Problem List   Diagnosis Date Noted  . Essential hypertension 03/23/2016  . Other specified hypothyroidism 12/16/2014  . Mixed diabetic hyperlipidemia associated with type 2 diabetes mellitus ([place]) 12/16/2014  . Long term current use of insulin ([place]) 12/16/2014  . Polyposis of colon 12/16/2014  . History of thyroid cancer 08/24/2011      Laureen Abrahams, PT, DPT 12/18/16 1:44 PM     [facility] [address] [place], [place], [address] Phone: [phone]   Fax:  [phone]  Name: Morgan Schmidt MRN: [id number] Date of Birth: 04-12-1951

## 2016-12-20 ENCOUNTER — Ambulatory Visit: Payer: 59 | Admitting: Physical Therapy

## 2016-12-20 ENCOUNTER — Encounter: Payer: Self-pay | Admitting: Physical Therapy

## 2016-12-20 DIAGNOSIS — M25562 Pain in left knee: Secondary | ICD-10-CM | POA: Diagnosis not present

## 2016-12-20 NOTE — Therapy (Signed)
[facility] [place], [place], [address] Phone: [phone]   Fax:  [phone]  Physical Therapy Treatment  Patient Details  Name: Morgan Schmidt MRN: [id number] Date of Birth: [birth year] Referring Provider: Paralee Cancel MD.  Encounter Date: 12/20/2016      PT End of Session - 12/20/16 1138    Visit Number 13   Number of Visits 20   Date for PT Re-Evaluation 01/16/17   PT Start Time 1114   PT Stop Time 1201   PT Time Calculation (min) 47 min   Activity Tolerance Patient tolerated treatment well   Behavior During Therapy [facility] for tasks assessed/performed      Past Medical History:  Diagnosis Date  . Arthritis   . Cancer ([place])    thyroid CA  . Diabetes mellitus    type 2  . Thyroid disease     Past Surgical History:  Procedure Laterality Date  . CESAREAN SECTION    . KNEE ARTHROSCOPY Left 10/22/2016   Procedure: ARTHROSCOPY LEFT KNEE WITH DEBRIDEMENT;  Surgeon: Paralee Cancel, MD;  Location: [facility];  Service: Orthopedics;  Laterality: Left;  . KNEE ARTHROSCOPY WITH MEDIAL MENISECTOMY  10/22/2016   Procedure: KNEE ARTHROSCOPY WITH MEDIAL MENISECTOMY;  Surgeon: Paralee Cancel, MD;  Location: [facility];  Service: Orthopedics;;  . KNEE ARTHROSCOPY WITH SUBCHONDROPLASTY Left 10/22/2016   Procedure: KNEE ARTHROSCOPY WITH MEDIAL CHONDROPLASTY;  Surgeon: Paralee Cancel, MD;  Location: [facility];  Service: Orthopedics;  Laterality: Left;  . THYROIDECTOMY  2009    There were no vitals filed for this visit.      Subjective Assessment - 12/20/16 1118    Subjective Patient reported some soreness in knee today   Limitations Standing   How long can you stand comfortably? 1 hour   Currently in Pain? Yes   Pain Score 2    Pain Location Knee   Pain Orientation Left   Pain Descriptors / Indicators Sore   Pain Type Surgical pain   Pain Onset 1 to 4 weeks ago   Pain Frequency  Constant   Aggravating Factors  prolong walking/standing   Pain Relieving Factors at rest                         [facility] Adult PT Treatment/Exercise - 12/20/16 0001      Knee/Hip Exercises: Aerobic   Stationary Bike L2 x 10 min     Knee/Hip Exercises: Standing   Terminal Knee Extension Strengthening;Left;3 sets;10 reps;Theraband   Theraband Level (Terminal Knee Extension) Level 3 (Green)   Lateral Step Up Left;2 sets;10 reps;Step Height: 6";Hand Hold: 2   Forward Step Up Left;3 sets;10 reps;Step Height: 6";Hand Hold: 2   Step Down 10 reps;Left;1 set;Step Height: 4";Hand Hold: 2     Knee/Hip Exercises: Seated   Long Arc Quad Left;3 sets;10 reps;Weights   Long Arc Quad Weight 3 lbs.   Long [employer] Limitations focus on Veterinary surgeon Location Lt knee  IFC x 15 mins   Electrical Stimulation Goals Edema;Pain     Vasopneumatic   Number Minutes Vasopneumatic  15 minutes   Vasopnuematic Location  Knee   Vasopneumatic Pressure Medium                     PT Long Term Goals - 12/03/16 1324      PT LONG  TERM GOAL #1   Title Independent with a HEP.   Time 4   Period Weeks   Status On-going     PT LONG TERM GOAL #2   Title Increase knee strength to a solid 5/5 to provide good stability for accomplishment of functional activities   Baseline L knee ext 5/5; flex 4+ to 5-/5 as of 12/03/16   Period Weeks   Status On-going     PT LONG TERM GOAL #3   Title Perform a reciprocating stair gait with one railing with pain not > 2-3/10.   Baseline poor eccentric control on L with descending; ascending WNL; pain 4-5/10 with pain   Time 4   Period Weeks   Status On-going               Plan - 12/20/16 1140    Clinical Impression Statement Patient tolerated treatment well today. Patient has some complaints of ongoing soreness in left knee. Patient able to perform eccentric control LAQ pain free  today. Patient progressing toward goals yet ongoing due to pain and strength deficts.    Rehab Potential Excellent   PT Frequency 3x / week   PT Duration 4 weeks   PT Treatment/Interventions ADLs/Self Care Home Management;Cryotherapy;Electrical Stimulation;Moist Heat;Ultrasound;Stair training;Patient/family education;Neuromuscular re-education;Therapeutic exercise;Therapeutic activities;Manual techniques;Passive range of motion;Vasopneumatic Device   PT Next Visit Plan cont with POC for eccentric quad strengthening, functional activities   Consulted and Agree with Plan of Care Patient      Patient will benefit from skilled therapeutic intervention in order to improve the following deficits and impairments:  Pain, Decreased activity tolerance, Decreased strength  Visit Diagnosis: Acute pain of left knee     Problem List Patient Active Problem List   Diagnosis Date Noted  . Essential hypertension 03/23/2016  . Other specified hypothyroidism 12/16/2014  . Mixed diabetic hyperlipidemia associated with type 2 diabetes mellitus ([place]) 12/16/2014  . Long term current use of insulin ([place]) 12/16/2014  . Polyposis of colon 12/16/2014  . History of thyroid cancer 08/24/2011    Phillips Climes, PTA 12/20/2016, 12:02 PM  [facility] [address] [place], [place], [address] Phone: [phone]   Fax:  [phone]  Name: Morgan Schmidt MRN: [id number] Date of Birth: [birth year]

## 2016-12-24 ENCOUNTER — Ambulatory Visit: Payer: 59 | Admitting: Physical Therapy

## 2016-12-24 ENCOUNTER — Encounter: Payer: Self-pay | Admitting: Physical Therapy

## 2016-12-24 DIAGNOSIS — M25562 Pain in left knee: Secondary | ICD-10-CM | POA: Diagnosis not present

## 2016-12-24 NOTE — Therapy (Signed)
[facility] [place], [place], [address] Phone: [phone]   Fax:  [phone]  Physical Therapy Treatment  Patient Details  Name: Morgan Schmidt MRN: [id number] Date of Birth: Oct 23, 1950 Referring Provider: Paralee Cancel MD.  Encounter Date: 12/24/2016      PT End of Session - 12/24/16 1334    Visit Number 14   Number of Visits 20   Date for PT Re-Evaluation 01/16/17   PT Start Time 1316   PT Stop Time 1404   PT Time Calculation (min) 48 min   Activity Tolerance Patient tolerated treatment well   Behavior During Therapy [facility] for tasks assessed/performed      Past Medical History:  Diagnosis Date  . Arthritis   . Cancer ([place])    thyroid CA  . Diabetes mellitus    type 2  . Thyroid disease     Past Surgical History:  Procedure Laterality Date  . CESAREAN SECTION    . KNEE ARTHROSCOPY Left 10/22/2016   Procedure: ARTHROSCOPY LEFT KNEE WITH DEBRIDEMENT;  Surgeon: Paralee Cancel, MD;  Location: [facility];  Service: Orthopedics;  Laterality: Left;  . KNEE ARTHROSCOPY WITH MEDIAL MENISECTOMY  10/22/2016   Procedure: KNEE ARTHROSCOPY WITH MEDIAL MENISECTOMY;  Surgeon: Paralee Cancel, MD;  Location: [facility];  Service: Orthopedics;;  . KNEE ARTHROSCOPY WITH SUBCHONDROPLASTY Left 10/22/2016   Procedure: KNEE ARTHROSCOPY WITH MEDIAL CHONDROPLASTY;  Surgeon: Paralee Cancel, MD;  Location: [facility];  Service: Orthopedics;  Laterality: Left;  . THYROIDECTOMY  2009    There were no vitals filed for this visit.      Subjective Assessment - 12/24/16 1326    Subjective Patient reported less pain today and tolerated last treatment well   Limitations Standing   How long can you stand comfortably? 1 hour   Currently in Pain? Yes   Pain Score 2    Pain Location Knee   Pain Orientation Left   Pain Descriptors / Indicators Sore   Pain Type Surgical pain   Pain Onset More than a  month ago   Pain Frequency Constant   Aggravating Factors  prolong activity   Pain Relieving Factors at rest                         [facility] Adult PT Treatment/Exercise - 12/24/16 0001      Knee/Hip Exercises: Aerobic   Nustep L5x14min     Knee/Hip Exercises: Standing   Terminal Knee Extension Strengthening;Left;3 sets;10 reps;Theraband   Theraband Level (Terminal Knee Extension) Level 3 (Green)   Lateral Step Up Left;2 sets;10 reps;Step Height: 6";Hand Hold: 2   Forward Step Up Left;3 sets;10 reps;Step Height: 6";Hand Hold: 2   Step Down 10 reps;Left;1 set;Step Height: 4";Hand Hold: 2     Knee/Hip Exercises: Seated   Long Arc Quad Left;3 sets;10 reps;Weights   Long Arc Quad Weight 3 lbs.   Long [employer] Limitations focus on Veterinary surgeon Location Lt knee  IFC x 15 mins   Electrical Stimulation Goals Edema;Pain     Vasopneumatic   Number Minutes Vasopneumatic  15 minutes   Vasopnuematic Location  Knee   Vasopneumatic Pressure Medium                     PT Long Term Goals - 12/03/16 1324      PT LONG TERM  GOAL #1   Title Independent with a HEP.   Time 4   Period Weeks   Status On-going     PT LONG TERM GOAL #2   Title Increase knee strength to a solid 5/5 to provide good stability for accomplishment of functional activities   Baseline L knee ext 5/5; flex 4+ to 5-/5 as of 12/03/16   Period Weeks   Status On-going     PT LONG TERM GOAL #3   Title Perform a reciprocating stair gait with one railing with pain not > 2-3/10.   Baseline poor eccentric control on L with descending; ascending WNL; pain 4-5/10 with pain   Time 4   Period Weeks   Status On-going               Plan - 12/24/16 1336    Clinical Impression Statement Patient tolerated treatment well today. Patient reported less soreness today and able to complete exercises with greater ease and no complaints. Patient  progressing with strengthening activities. Current goals ongoing due to pain and strength deficts.    Rehab Potential Excellent   PT Frequency 3x / week   PT Duration 4 weeks   PT Treatment/Interventions ADLs/Self Care Home Management;Cryotherapy;Electrical Stimulation;Moist Heat;Ultrasound;Stair training;Patient/family education;Neuromuscular re-education;Therapeutic exercise;Therapeutic activities;Manual techniques;Passive range of motion;Vasopneumatic Device   PT Next Visit Plan cont with POC for eccentric quad strengthening, functional activities (MD. Alvan Dame 01/03/17)   Consulted and Agree with Plan of Care Patient      Patient will benefit from skilled therapeutic intervention in order to improve the following deficits and impairments:  Pain, Decreased activity tolerance, Decreased strength  Visit Diagnosis: Acute pain of left knee     Problem List Patient Active Problem List   Diagnosis Date Noted  . Essential hypertension 03/23/2016  . Other specified hypothyroidism 12/16/2014  . Mixed diabetic hyperlipidemia associated with type 2 diabetes mellitus ([place]) 12/16/2014  . Long term current use of insulin ([place]) 12/16/2014  . Polyposis of colon 12/16/2014  . History of thyroid cancer 08/24/2011    Phillips Climes, PTA 12/24/2016, 2:04 PM  [facility] [place], [place], [address] Phone: [phone]   Fax:  [phone]  Name: Morgan Schmidt MRN: [id number] Date of Birth: May 11, 1951

## 2016-12-27 ENCOUNTER — Encounter: Payer: 59 | Admitting: Physical Therapy

## 2016-12-27 MED FILL — LANTUS SOLOSTAR 100 UNITS/M: 100 | 28 days supply | Qty: 30 | Fill #4

## 2016-12-28 ENCOUNTER — Ambulatory Visit: Payer: 59 | Admitting: Physical Therapy

## 2016-12-28 DIAGNOSIS — M25562 Pain in left knee: Secondary | ICD-10-CM

## 2016-12-28 NOTE — Therapy (Signed)
[facility] [place], [place], [address] Phone: [phone]   Fax:  [phone]  Physical Therapy Treatment  Patient Details  Name: Morgan Schmidt MRN: [id number] Date of Birth: Jan 15, 1951 Referring Provider: Paralee Cancel MD.  Encounter Date: 12/28/2016      PT End of Session - 12/28/16 1208    Visit Number 15   Number of Visits 20   Date for PT Re-Evaluation 01/16/17   PT Start Time 1030   PT Stop Time 1125   PT Time Calculation (min) 55 min   Activity Tolerance Patient tolerated treatment well   Behavior During Therapy [facility] for tasks assessed/performed      Past Medical History:  Diagnosis Date  . Arthritis   . Cancer ([place])    thyroid CA  . Diabetes mellitus    type 2  . Thyroid disease     Past Surgical History:  Procedure Laterality Date  . CESAREAN SECTION    . KNEE ARTHROSCOPY Left 10/22/2016   Procedure: ARTHROSCOPY LEFT KNEE WITH DEBRIDEMENT;  Surgeon: Paralee Cancel, MD;  Location: [facility];  Service: Orthopedics;  Laterality: Left;  . KNEE ARTHROSCOPY WITH MEDIAL MENISECTOMY  10/22/2016   Procedure: KNEE ARTHROSCOPY WITH MEDIAL MENISECTOMY;  Surgeon: Paralee Cancel, MD;  Location: [facility];  Service: Orthopedics;;  . KNEE ARTHROSCOPY WITH SUBCHONDROPLASTY Left 10/22/2016   Procedure: KNEE ARTHROSCOPY WITH MEDIAL CHONDROPLASTY;  Surgeon: Paralee Cancel, MD;  Location: [facility];  Service: Orthopedics;  Laterality: Left;  . THYROIDECTOMY  2009    There were no vitals filed for this visit.      Subjective Assessment - 12/28/16 1208    Subjective I'm doing good.   Pain Score 2    Pain Location Knee   Pain Orientation Left   Pain Descriptors / Indicators Sore   Pain Type Surgical pain   Pain Onset More than a month ago                         [facility] Adult PT Treatment/Exercise - 12/28/16 0001      Exercises   Exercises Knee/Hip     Knee/Hip Exercises: Aerobic   Stationary Bike Level 2 x 15 minutes.     Knee/Hip Exercises: Machines for Strengthening   Cybex Knee Extension 10# x 5 minutes.   Cybex Leg Press 2 1/2 plates x 5 minutes.     Modalities   Modalities Location manager Stimulation Location Left knee.   Electrical Stimulation Action IFC   Electrical Stimulation Parameters 80-150 Hz x 20 minutes.   Electrical Stimulation Goals Edema;Pain     Vasopneumatic   Number Minutes Vasopneumatic  20 minutes   Vasopnuematic Location  --  Left knee.   Vasopneumatic Pressure Medium                     PT Long Term Goals - 12/03/16 1324      PT LONG TERM GOAL #1   Title Independent with a HEP.   Time 4   Period Weeks   Status On-going     PT LONG TERM GOAL #2   Title Increase knee strength to a solid 5/5 to provide good stability for accomplishment of functional activities   Baseline L knee ext 5/5; flex 4+ to 5-/5 as of 12/03/16   Period Weeks   Status On-going  PT LONG TERM GOAL #3   Title Perform a reciprocating stair gait with one railing with pain not > 2-3/10.   Baseline poor eccentric control on L with descending; ascending WNL; pain 4-5/10 with pain   Time 4   Period Weeks   Status On-going               Plan - 12/28/16 1212    Clinical Impression Statement No pain reported after treatment today.      Patient will benefit from skilled therapeutic intervention in order to improve the following deficits and impairments:  Pain, Decreased activity tolerance, Decreased strength  Visit Diagnosis: Acute pain of left knee     Problem List Patient Active Problem List   Diagnosis Date Noted  . Essential hypertension 03/23/2016  . Other specified hypothyroidism 12/16/2014  . Mixed diabetic hyperlipidemia associated with type 2 diabetes mellitus ([place]) 12/16/2014  . Long term current use of insulin ([place])  12/16/2014  . Polyposis of colon 12/16/2014  . History of thyroid cancer 08/24/2011    Kristain Hu, [place] MPT 12/28/2016, 12:14 PM  [facility] [address] [place], [place], [address] Phone: [phone]   Fax:  [phone]  Name: Morgan Schmidt MRN: [id number] Date of Birth: July 24, 1951

## 2017-01-02 ENCOUNTER — Encounter: Payer: Self-pay | Admitting: Physical Therapy

## 2017-01-02 ENCOUNTER — Ambulatory Visit: Payer: 59 | Admitting: Physical Therapy

## 2017-01-02 DIAGNOSIS — M25562 Pain in left knee: Secondary | ICD-10-CM | POA: Diagnosis not present

## 2017-01-02 NOTE — Therapy (Addendum)
[facility] [place], [place], [address] Phone: [phone]   Fax:  [phone]  Physical Therapy Treatment  Patient Details  Name: Morgan Schmidt MRN: [id number] Date of Birth: 07-Jan-1951 Referring Provider: Paralee Cancel MD.  Encounter Date: 01/02/2017      PT End of Session - 01/02/17 1335    Visit Number 16   Number of Visits 20   Date for PT Re-Evaluation 01/16/17   PT Start Time 9518   PT Stop Time 1413   PT Time Calculation (min) 58 min   Activity Tolerance Patient tolerated treatment well   Behavior During Therapy [facility] for tasks assessed/performed      Past Medical History:  Diagnosis Date  . Arthritis   . Cancer ([place])    thyroid CA  . Diabetes mellitus    type 2  . Thyroid disease     Past Surgical History:  Procedure Laterality Date  . CESAREAN SECTION    . KNEE ARTHROSCOPY Left 10/22/2016   Procedure: ARTHROSCOPY LEFT KNEE WITH DEBRIDEMENT;  Surgeon: Paralee Cancel, MD;  Location: [facility];  Service: Orthopedics;  Laterality: Left;  . KNEE ARTHROSCOPY WITH MEDIAL MENISECTOMY  10/22/2016   Procedure: KNEE ARTHROSCOPY WITH MEDIAL MENISECTOMY;  Surgeon: Paralee Cancel, MD;  Location: [facility];  Service: Orthopedics;;  . KNEE ARTHROSCOPY WITH SUBCHONDROPLASTY Left 10/22/2016   Procedure: KNEE ARTHROSCOPY WITH MEDIAL CHONDROPLASTY;  Surgeon: Paralee Cancel, MD;  Location: [facility];  Service: Orthopedics;  Laterality: Left;  . THYROIDECTOMY  2009    There were no vitals filed for this visit.      Subjective Assessment - 01/02/17 1332    Subjective Patient reported doing good overall   Limitations Standing   How long can you stand comfortably? 1 hour   Currently in Pain? Yes   Pain Score 2    Pain Location Knee   Pain Orientation Left   Pain Descriptors / Indicators Sore   Pain Type Surgical pain   Pain Onset More than a month ago   Pain Frequency  Constant   Aggravating Factors  prolong activity   Pain Relieving Factors at rest            [facility] PT Assessment - 01/02/17 0001      ROM / Strength   AROM / PROM / Strength Strength     Strength   Strength Assessment Site Knee   Right/Left Knee Left   Left Knee Flexion 4+/5   Left Knee Extension 5/5                     [facility] Adult PT Treatment/Exercise - 01/02/17 0001      Knee/Hip Exercises: Aerobic   Stationary Bike Level 2 x 15 minutes.     Knee/Hip Exercises: Standing   Terminal Knee Extension Strengthening;Left;3 sets;10 reps;Theraband   Theraband Level (Terminal Knee Extension) Level 3 (Green)  with glut set   Lateral Step Up Left;2 sets;10 reps;Step Height: 6";Hand Hold: 2   Forward Step Up Left;3 sets;10 reps;Step Height: 6";Hand Hold: 2   Step Down 10 reps;Left;1 set;Step Height: 4";Hand Hold: 2     Knee/Hip Exercises: Seated   Long Arc Quad Left;3 sets;10 reps;Weights   Long Arc Quad Weight 4 lbs.   Long [employer] Limitations focus on eccentric control     Knee/Hip Exercises: Supine   Bridges Strengthening;Both;2 sets;10 reps  with green t-and abd  Acupuncturist Location Left knee.   Electrical Stimulation Action IFC   Electrical Stimulation Parameters 80-150hz  x57mn   Electrical Stimulation Goals Edema;Pain     Vasopneumatic   Number Minutes Vasopneumatic  15 minutes   Vasopnuematic Location  Knee   Vasopneumatic Pressure Medium                PT Education - 01/02/17 1408    Education provided Yes   Education Details HEP   Person(s) Educated Patient   Methods Explanation;Demonstration;Handout   Comprehension Verbalized understanding;Returned demonstration             PT Long Term Goals - 01/02/17 1401      PT LONG TERM GOAL #1   Title Independent with a HEP.   Time 4   Period Weeks   Status Achieved     PT LONG TERM GOAL #2   Title Increase knee strength to a solid 5/5  to provide good stability for accomplishment of functional activities   Baseline L knee ext 5/5; flex 4+ to 5-/5 as of 12/03/16   Time 4   Period Weeks   Status On-going     PT LONG TERM GOAL #3   Title Perform a reciprocating stair gait with one railing with pain not > 2-3/10.   Baseline poor eccentric control on L with descending; ascending WNL; pain 4-5/10 with pain   Time 4   Period Weeks   Status Achieved               Plan - 01/02/17 1409    Clinical Impression Statement Patient reported feeling less pain overall and has improved with all activities. Patient is able to perform ADL's and stairs with greater ease and less pain. Patient was given HEP and independent. Patient met all but strength goals today.   Rehab Potential Excellent   PT Frequency 3x / week   PT Duration 4 weeks   PT Next Visit Plan cont with POC for eccentric quad strengthening, functional activities (MD. OAlvan Dame5/17/18)      Patient will benefit from skilled therapeutic intervention in order to improve the following deficits and impairments:  Pain, Decreased activity tolerance, Decreased strength  Visit Diagnosis: Acute pain of left knee     Problem List Patient Active Problem List   Diagnosis Date Noted  . Essential hypertension 03/23/2016  . Other specified hypothyroidism 12/16/2014  . Mixed diabetic hyperlipidemia associated with type 2 diabetes mellitus ([place] 12/16/2014  . Long term current use of insulin ([place] 12/16/2014  . Polyposis of colon 12/16/2014  . History of thyroid cancer 08/24/2011    Morgan Schmidt PTA 01/02/17 5:23 PM CMaliApplegate MPT [facility] [address] [id number]: [phone]  Fax:  [phone] Name: Morgan HANNENMRN: [id number] of Birth: [birth year] PHYSICAL THERAPY DISCHARGE SUMMARY  Visits from Start of Care: 16.  Current functional level related to goals / functional  outcomes: See above.   Remaining deficits: Goals essentially met.   Education / Equipment: HEP. Plan: Patient agrees to discharge.  Patient goals were met. Patient is being discharged due to being pleased with the current functional level.  ?????         CMaliApplegate MPT

## 2017-01-02 NOTE — Patient Instructions (Addendum)
Strengthening: Hip Abduction (Side-Lying)   Tighten muscles on front of left thigh, then lift leg __5__ inches from surface, keeping knee locked.  Repeat __10__ times per set. Do __2__ sets per session. Do _2___ sessions per day.     Terminal Knee Extension (Standing)    Facing anchor with right knee slightly bent and tubing just above knee, gently pull knee back straight. Do not overextend knee. Repeat _30___ times per set. Do __1__ sets per session. Do _1-2___ sessions per day.   Knee Flexion: Resisted (Standing)    With support, __0-3__ pound weight around right ankle, slowly bend knee up. Return slowly.  Repeat _10___ times per set. Do __2__ sets per session. Do __1-2__ sessions per day.   Toe-Up (Ankle Plantar Flexion and Dorsiflexion)   Bridging   Slowly raise buttocks from floor, keeping stomach tight. Repeat _10___ times per set. Do __2__ sets per session. Do __2__ sessions per day.

## 2017-01-04 ENCOUNTER — Encounter: Payer: 59 | Admitting: *Deleted

## 2017-01-07 MED FILL — MELOXICAM 15 MG TABLET: 15 | 30 days supply | Qty: 30 | Fill #0

## 2017-01-08 MED FILL — FENOFIBRIC ACID DR 135 MG C: 135 | 30 days supply | Qty: 30 | Fill #1

## 2017-01-17 MED FILL — TRULICITY 1.5 MG/0.5 ML PEN: 1.5 | 28 days supply | Qty: 2 | Fill #10

## 2017-01-21 NOTE — Addendum Note (Signed)
Addendum  created 01/21/17 1201 by Myrtie Soman, MD   Sign clinical note

## 2017-01-31 ENCOUNTER — Other Ambulatory Visit: Payer: Self-pay | Admitting: Family Medicine

## 2017-01-31 DIAGNOSIS — E039 Hypothyroidism, unspecified: Secondary | ICD-10-CM

## 2017-02-01 MED FILL — LEVOTHYROXINE 125 MCG TABLE: 125 | 90 days supply | Qty: 90 | Fill #0

## 2017-02-07 ENCOUNTER — Other Ambulatory Visit: Payer: 59

## 2017-02-07 DIAGNOSIS — E1169 Type 2 diabetes mellitus with other specified complication: Secondary | ICD-10-CM

## 2017-02-07 DIAGNOSIS — E782 Mixed hyperlipidemia: Secondary | ICD-10-CM | POA: Diagnosis not present

## 2017-02-07 DIAGNOSIS — E038 Other specified hypothyroidism: Secondary | ICD-10-CM

## 2017-02-07 LAB — BAYER DCA HB A1C WAIVED: HB A1C (BAYER DCA - WAIVED): 7.1 % — ABNORMAL HIGH (ref <7.0)

## 2017-02-07 MED FILL — LANTUS SOLOSTAR 100 UNITS/M: 100 | 28 days supply | Qty: 30 | Fill #5

## 2017-02-08 ENCOUNTER — Ambulatory Visit (INDEPENDENT_AMBULATORY_CARE_PROVIDER_SITE_OTHER): Payer: 59 | Admitting: Family Medicine

## 2017-02-08 ENCOUNTER — Encounter: Payer: Self-pay | Admitting: Family Medicine

## 2017-02-08 VITALS — BP 121/65 | HR 72 | Temp 98.2°F | Ht 59.0 in | Wt 170.0 lb

## 2017-02-08 DIAGNOSIS — E782 Mixed hyperlipidemia: Secondary | ICD-10-CM

## 2017-02-08 DIAGNOSIS — Z794 Long term (current) use of insulin: Secondary | ICD-10-CM

## 2017-02-08 DIAGNOSIS — I10 Essential (primary) hypertension: Secondary | ICD-10-CM

## 2017-02-08 DIAGNOSIS — E038 Other specified hypothyroidism: Secondary | ICD-10-CM | POA: Diagnosis not present

## 2017-02-08 DIAGNOSIS — E1169 Type 2 diabetes mellitus with other specified complication: Secondary | ICD-10-CM | POA: Diagnosis not present

## 2017-02-08 LAB — URINALYSIS
BILIRUBIN UA: NEGATIVE
GLUCOSE, UA: NEGATIVE
Ketones, UA: NEGATIVE
Nitrite, UA: NEGATIVE
PROTEIN UA: NEGATIVE
RBC, UA: NEGATIVE
Specific Gravity, UA: 1.015 (ref 1.005–1.030)
Urobilinogen, Ur: 0.2 mg/dL (ref 0.2–1.0)
pH, UA: 5.5 (ref 5.0–7.5)

## 2017-02-08 LAB — CBC WITH DIFFERENTIAL/PLATELET
BASOS: 1 %
Basophils Absolute: 0.1 10*3/uL (ref 0.0–0.2)
EOS (ABSOLUTE): 0.2 10*3/uL (ref 0.0–0.4)
EOS: 3 %
Hematocrit: 38.9 % (ref 34.0–46.6)
Hemoglobin: 12.7 g/dL (ref 11.1–15.9)
IMMATURE GRANULOCYTES: 0 %
Immature Grans (Abs): 0 10*3/uL (ref 0.0–0.1)
LYMPHS ABS: 2.1 10*3/uL (ref 0.7–3.1)
Lymphs: 32 %
MCH: 28.3 pg (ref 26.6–33.0)
MCHC: 32.6 g/dL (ref 31.5–35.7)
MCV: 87 fL (ref 79–97)
MONOS ABS: 0.4 10*3/uL (ref 0.1–0.9)
Monocytes: 6 %
NEUTROS ABS: 3.8 10*3/uL (ref 1.4–7.0)
NEUTROS PCT: 58 %
PLATELETS: 224 10*3/uL (ref 150–379)
RBC: 4.48 x10E6/uL (ref 3.77–5.28)
RDW: 13.7 % (ref 12.3–15.4)
WBC: 6.5 10*3/uL (ref 3.4–10.8)

## 2017-02-08 LAB — LIPID PANEL
Chol/HDL Ratio: 5.4 ratio — ABNORMAL HIGH (ref 0.0–4.4)
Cholesterol, Total: 179 mg/dL (ref 100–199)
HDL: 33 mg/dL — ABNORMAL LOW (ref >39)
LDL Calculated: 71 mg/dL (ref 0–99)
Triglycerides: 373 mg/dL — ABNORMAL HIGH (ref 0–149)
VLDL CHOLESTEROL CAL: 75 mg/dL — AB (ref 5–40)

## 2017-02-08 LAB — CMP14+EGFR
A/G RATIO: 2.4 — AB (ref 1.2–2.2)
ALT: 42 IU/L — AB (ref 0–32)
AST: 52 IU/L — ABNORMAL HIGH (ref 0–40)
Albumin: 4.5 g/dL (ref 3.6–4.8)
Alkaline Phosphatase: 72 IU/L (ref 39–117)
BILIRUBIN TOTAL: 0.6 mg/dL (ref 0.0–1.2)
BUN/Creatinine Ratio: 13 (ref 12–28)
BUN: 9 mg/dL (ref 8–27)
CALCIUM: 8.6 mg/dL — AB (ref 8.7–10.3)
CHLORIDE: 99 mmol/L (ref 96–106)
CO2: 21 mmol/L (ref 20–29)
Creatinine, Ser: 0.7 mg/dL (ref 0.57–1.00)
GFR calc Af Amer: 107 mL/min/{1.73_m2} (ref >59)
GFR, EST NON AFRICAN AMERICAN: 93 mL/min/{1.73_m2} (ref >59)
GLOBULIN, TOTAL: 1.9 g/dL (ref 1.5–4.5)
Glucose: 156 mg/dL — ABNORMAL HIGH (ref 65–99)
POTASSIUM: 3.8 mmol/L (ref 3.5–5.2)
Sodium: 138 mmol/L (ref 134–144)
Total Protein: 6.4 g/dL (ref 6.0–8.5)

## 2017-02-08 NOTE — Progress Notes (Signed)
Subjective:  [name],  female    DOB: 1951-02-13  Age: 66 y.o.    CC: Diabetes (pt here today for routine follow up of her chronic medical conditions)   HPI CHENAY NESMITH presents for  follow-up of hypertension. Patient has no history of headache chest pain or shortness of breath or recent cough. Patient also denies symptoms of TIA such as numbness weakness lateralizing. Patient checks  blood pressure at home. Recent readings have been good Patient denies side effects from medication. States taking it regularly.  Patient also  in for follow-up of elevated cholesterol. Doing well without complaints on current medication. Denies side effects of statin including myalgia and arthralgia and nausea. Also in today for liver function testing. Currently no chest pain, shortness of breath or other cardiovascular related symptoms noted.  Follow-up of diabetes. Patient does check blood sugar at home. Readings run between110-170 Patient denies symptoms such as polyuria, polydipsia, excessive hunger, nausea No significant hypoglycemic spells noted. Medications reviewed. Pt reports taking them regularly. Pt. denies complication/adverse reaction today.    History Dillyn has a past medical history of Arthritis; Cancer ([place]); Diabetes mellitus; and Thyroid disease.   She has a past surgical history that includes Thyroidectomy (2009); Cesarean section; Knee arthroscopy (Left, 10/22/2016); Knee arthroscopy with medial menisectomy (10/22/2016); and Knee arthroscopy with subchondroplasty (Left, 10/22/2016).   Her family history is not on file.She reports that she quit smoking about 16 years ago. She has never used smokeless tobacco. She reports that she does not drink alcohol or use drugs.  Current Outpatient Prescriptions on File Prior to Visit  Medication Sig Dispense Refill  . ACCU-CHEK FASTCLIX LANCETS MISC Use to check Blood Sugars twice daily. 102 each 3  . Cholecalciferol (VITAMIN  D) 2000 UNITS tablet Take 1 tablet (2,000 Units total) by mouth daily. (Patient taking differently: Take 2,000 Units by mouth every evening. ) 30 tablet 11  . Choline Fenofibrate (FENOFIBRIC ACID) 135 MG CPDR TAKE 1 CAPSULE BY MOUTH DAILY. 30 capsule 5  . diclofenac sodium (VOLTAREN) 1 % GEL Apply topically 4 (four) times daily.    . Dulaglutide (TRULICITY) 1.5 AV/4.0JW SOPN One dose weekly (Patient taking differently: Inject 1.5 mg into the skin every 7 (seven) days. One dose weekly on Fridays) 4 pen 11  . ezetimibe (ZETIA) 10 MG tablet TAKE 1 TABLET BY MOUTH ONCE DAILY FOR CHOLESTEROL 90 tablet 3  . glucose blood (ACCU-CHEK GUIDE) test strip Use as instructed 100 each 3  . Insulin Glargine (LANTUS SOLOSTAR) 100 UNIT/ML Solostar Pen Inject 110 Units into the skin every morning. 11 pen PRN  . levothyroxine (SYNTHROID, LEVOTHROID) 125 MCG tablet TAKE 1 TABLET BY MOUTH DAILY. 90 tablet 2  . meloxicam (MOBIC) 15 MG tablet Take 1 tablet (15 mg total) by mouth daily. 30 tablet 1  . metFORMIN (GLUCOPHAGE) 1000 MG tablet TAKE 1 TABLET BY MOUTH 2 TIMES DAILY WITH A MEAL. 180 tablet 1  . NOVOFINE 30G X 8 MM MISC USE AS DIRECTED ONCE DAILY 100 each 1  . valsartan (DIOVAN) 160 MG tablet Take 1 tablet (160 mg total) by mouth daily. For Blood pressure 90 tablet 3   No current facility-administered medications on file prior to visit.     ROS Review of Systems  Constitutional: Negative for activity change, appetite change and fever.  HENT: Negative for congestion, rhinorrhea and sore throat.   Eyes: Negative for visual disturbance.  Respiratory: Negative for cough and shortness of breath.  Cardiovascular: Negative for chest pain and palpitations.  Gastrointestinal: Negative for abdominal pain, diarrhea and nausea.  Genitourinary: Negative for dysuria.  Musculoskeletal: Negative for arthralgias and myalgias.    Objective:  BP 121/65   Pulse 72   Temp 98.2 F (36.8 C) (Oral)   Ht 4' 11"  (1.499 m)    Wt 170 lb (77.1 kg)   LMP 03/02/2004   BMI 34.34 kg/m   BP Readings from Last 3 Encounters:  02/08/17 121/65  11/08/16 136/67  10/22/16 (!) 150/69    Wt Readings from Last 3 Encounters:  02/08/17 170 lb (77.1 kg)  11/08/16 169 lb (76.7 kg)  10/22/16 167 lb (75.8 kg)     Physical Exam  Constitutional: She is oriented to person, place, and time. She appears well-developed and well-nourished. No distress.  HENT:  Head: Normocephalic and atraumatic.  Right Ear: External ear normal.  Left Ear: External ear normal.  Nose: Nose normal.  Mouth/Throat: Oropharynx is clear and moist.  Eyes: Conjunctivae and EOM are normal. Pupils are equal, round, and reactive to light.  Neck: Normal range of motion. Neck supple. No thyromegaly present.  Cardiovascular: Normal rate, regular rhythm and normal heart sounds.   No murmur heard. Pulmonary/Chest: Effort normal and breath sounds normal. No respiratory distress. She has no wheezes. She has no rales.  Abdominal: Soft. Bowel sounds are normal. She exhibits no distension. There is no tenderness.  Lymphadenopathy:    She has no cervical adenopathy.  Neurological: She is alert and oriented to person, place, and time. She has normal reflexes.  Skin: Skin is warm and dry.  Psychiatric: She has a normal mood and affect. Her behavior is normal. Judgment and thought content normal.    Diabetic Foot Exam - Simple   Simple Foot Form Diabetic Foot exam was performed with the following findings:  Yes 02/08/2017 10:28 AM  Visual Inspection No deformities, no ulcerations, no other skin breakdown bilaterally:  Yes Sensation Testing Intact to touch and monofilament testing bilaterally:  Yes Pulse Check Posterior Tibialis and Dorsalis pulse intact bilaterally:  Yes Comments     Results for orders placed or performed in visit on 02/07/17  Bayer DCA Hb A1c Waived  Result Value Ref Range   Bayer DCA Hb A1c Waived 7.1 (H) <7.0 %  CBC with  Differential/Platelet  Result Value Ref Range   WBC 6.5 3.4 - 10.8 x10E3/uL   RBC 4.48 3.77 - 5.28 x10E6/uL   Hemoglobin 12.7 11.1 - 15.9 g/dL   Hematocrit 38.9 34.0 - 46.6 %   MCV 87 79 - 97 fL   MCH 28.3 26.6 - 33.0 pg   MCHC 32.6 31.5 - 35.7 g/dL   RDW 13.7 12.3 - 15.4 %   Platelets 224 150 - 379 x10E3/uL   Neutrophils 58 Not Estab. %   Lymphs 32 Not Estab. %   Monocytes 6 Not Estab. %   Eos 3 Not Estab. %   Basos 1 Not Estab. %   Neutrophils Absolute 3.8 1.4 - 7.0 x10E3/uL   Lymphocytes Absolute 2.1 0.7 - 3.1 x10E3/uL   Monocytes Absolute 0.4 0.1 - 0.9 x10E3/uL   EOS (ABSOLUTE) 0.2 0.0 - 0.4 x10E3/uL   Basophils Absolute 0.1 0.0 - 0.2 x10E3/uL   Immature Granulocytes 0 Not Estab. %   Immature Grans (Abs) 0.0 0.0 - 0.1 x10E3/uL  CMP14+EGFR  Result Value Ref Range   Glucose 156 (H) 65 - 99 mg/dL   BUN 9 8 - 27 mg/dL  Creatinine, Ser 0.70 0.57 - 1.00 mg/dL   GFR calc non Af Amer 93 >59 mL/min/1.73   GFR calc Af Amer 107 >59 mL/min/1.73   BUN/Creatinine Ratio 13 12 - 28   Sodium 138 134 - 144 mmol/L   Potassium 3.8 3.5 - 5.2 mmol/L   Chloride 99 96 - 106 mmol/L   CO2 21 20 - 29 mmol/L   Calcium 8.6 (L) 8.7 - 10.3 mg/dL   Total Protein 6.4 6.0 - 8.5 g/dL   Albumin 4.5 3.6 - 4.8 g/dL   Globulin, Total 1.9 1.5 - 4.5 g/dL   Albumin/Globulin Ratio 2.4 (H) 1.2 - 2.2   Bilirubin Total 0.6 0.0 - 1.2 mg/dL   Alkaline Phosphatase 72 39 - 117 IU/L   AST 52 (H) 0 - 40 IU/L   ALT 42 (H) 0 - 32 IU/L  Lipid panel  Result Value Ref Range   Cholesterol, Total 179 100 - 199 mg/dL   Triglycerides 373 (H) 0 - 149 mg/dL   HDL 33 (L) >39 mg/dL   VLDL Cholesterol Cal 75 (H) 5 - 40 mg/dL   LDL Calculated 71 0 - 99 mg/dL   Chol/HDL Ratio 5.4 (H) 0.0 - 4.4 ratio     Assessment & Plan:   Quetzali was seen today for diabetes.  Diagnoses and all orders for this visit:  Other specified hypothyroidism  Mixed diabetic hyperlipidemia associated with type 2 diabetes mellitus ([place]) -      Microalbumin / creatinine urine ratio -     Urinalysis  Long term current use of insulin ([facility])  Essential hypertension   I am having Ms. Gullickson maintain her Vitamin D, Dulaglutide, NOVOFINE, diclofenac sodium, ACCU-CHEK FASTCLIX LANCETS, glucose blood, valsartan, meloxicam, Insulin Glargine, ezetimibe, metFORMIN, Fenofibric Acid, and levothyroxine.  Diabetic foot care reviewed in detail.   Follow-up: No Follow-up on file.  Claretta Fraise, M.D.

## 2017-02-09 LAB — MICROALBUMIN / CREATININE URINE RATIO
Creatinine, Urine: 65.4 mg/dL
Microalb/Creat Ratio: 11.2 mg/g creat (ref 0.0–30.0)
Microalbumin, Urine: 7.3 ug/mL

## 2017-02-21 MED FILL — TRULICITY 1.5 MG/0.5 ML PEN: 1.5 | 28 days supply | Qty: 2 | Fill #11

## 2017-03-15 MED FILL — LANTUS SOLOSTAR 100 UNITS/M: 100 | 28 days supply | Qty: 30 | Fill #6

## 2017-03-20 ENCOUNTER — Other Ambulatory Visit: Payer: Self-pay | Admitting: Family Medicine

## 2017-03-21 MED FILL — TRULICITY 1.5 MG/0.5 ML PEN: 1.5 | 28 days supply | Qty: 2 | Fill #0

## 2017-03-25 ENCOUNTER — Other Ambulatory Visit: Payer: Self-pay | Admitting: Family Medicine

## 2017-03-25 MED FILL — EZETIMIBE 10 MG TAB: 10 | 90 days supply | Qty: 90 | Fill #1

## 2017-03-25 MED FILL — NOVOFINE 30 NEEDLES: 30G X 8 MM | 90 days supply | Qty: 100 | Fill #0

## 2017-03-25 MED FILL — FENOFIBRIC ACID DR 135 MG C: 135 | 90 days supply | Qty: 90 | Fill #2

## 2017-03-25 MED FILL — ACCU-CHEK FASTCLIX LANCETS: 51 days supply | Qty: 102 | Fill #1

## 2017-03-25 MED FILL — ACCU-CHEK GUIDE TEST STRIP: 50 days supply | Qty: 100 | Fill #1

## 2017-03-25 MED FILL — metFORMIN HCL 1000 MG TABS: 1000 | 90 days supply | Qty: 180 | Fill #1

## 2017-04-16 MED FILL — TRULICITY 1.5 MG/0.5 ML PEN: 1.5 | 28 days supply | Qty: 2 | Fill #1

## 2017-04-23 MED FILL — LANTUS SOLOSTAR 100 UNITS/M: 100 | 28 days supply | Qty: 30 | Fill #7

## 2017-05-08 MED FILL — LEVOTHYROXINE 125 MCG TABLE: 125 | 90 days supply | Qty: 90 | Fill #1

## 2017-05-10 ENCOUNTER — Other Ambulatory Visit: Payer: 59

## 2017-05-10 DIAGNOSIS — E1169 Type 2 diabetes mellitus with other specified complication: Secondary | ICD-10-CM

## 2017-05-10 DIAGNOSIS — E782 Mixed hyperlipidemia: Principal | ICD-10-CM

## 2017-05-10 DIAGNOSIS — E038 Other specified hypothyroidism: Secondary | ICD-10-CM

## 2017-05-10 LAB — BAYER DCA HB A1C WAIVED: HB A1C (BAYER DCA - WAIVED): 6.7 % (ref <7.0)

## 2017-05-11 LAB — T4 AND TSH
T4 TOTAL: 13 ug/dL — AB (ref 4.5–12.0)
TSH: 3.07 u[IU]/mL (ref 0.450–4.500)

## 2017-05-11 LAB — CBC WITH DIFFERENTIAL/PLATELET
BASOS: 1 %
Basophils Absolute: 0.1 10*3/uL (ref 0.0–0.2)
EOS (ABSOLUTE): 0.2 10*3/uL (ref 0.0–0.4)
EOS: 3 %
HEMATOCRIT: 37.5 % (ref 34.0–46.6)
Hemoglobin: 12.5 g/dL (ref 11.1–15.9)
IMMATURE GRANULOCYTES: 0 %
Immature Grans (Abs): 0 10*3/uL (ref 0.0–0.1)
LYMPHS ABS: 2.4 10*3/uL (ref 0.7–3.1)
Lymphs: 36 %
MCH: 27.2 pg (ref 26.6–33.0)
MCHC: 33.3 g/dL (ref 31.5–35.7)
MCV: 82 fL (ref 79–97)
MONOS ABS: 0.4 10*3/uL (ref 0.1–0.9)
Monocytes: 6 %
NEUTROS ABS: 3.7 10*3/uL (ref 1.4–7.0)
Neutrophils: 54 %
Platelets: 223 10*3/uL (ref 150–379)
RBC: 4.6 x10E6/uL (ref 3.77–5.28)
RDW: 14 % (ref 12.3–15.4)
WBC: 6.8 10*3/uL (ref 3.4–10.8)

## 2017-05-11 LAB — CMP14+EGFR
A/G RATIO: 2.4 — AB (ref 1.2–2.2)
ALT: 24 IU/L (ref 0–32)
AST: 27 IU/L (ref 0–40)
Albumin: 4.6 g/dL (ref 3.6–4.8)
Alkaline Phosphatase: 57 IU/L (ref 39–117)
BUN/Creatinine Ratio: 19 (ref 12–28)
BUN: 15 mg/dL (ref 8–27)
Bilirubin Total: 0.6 mg/dL (ref 0.0–1.2)
CALCIUM: 8.9 mg/dL (ref 8.7–10.3)
CO2: 20 mmol/L (ref 20–29)
Chloride: 97 mmol/L (ref 96–106)
Creatinine, Ser: 0.8 mg/dL (ref 0.57–1.00)
GFR calc Af Amer: 90 mL/min/{1.73_m2} (ref >59)
GFR, EST NON AFRICAN AMERICAN: 78 mL/min/{1.73_m2} (ref >59)
GLOBULIN, TOTAL: 1.9 g/dL (ref 1.5–4.5)
Glucose: 84 mg/dL (ref 65–99)
Potassium: 3.9 mmol/L (ref 3.5–5.2)
Sodium: 139 mmol/L (ref 134–144)
Total Protein: 6.5 g/dL (ref 6.0–8.5)

## 2017-05-11 LAB — LIPID PANEL
CHOL/HDL RATIO: 3.9 ratio (ref 0.0–4.4)
Cholesterol, Total: 151 mg/dL (ref 100–199)
HDL: 39 mg/dL — ABNORMAL LOW (ref >39)
LDL Calculated: 73 mg/dL (ref 0–99)
TRIGLYCERIDES: 196 mg/dL — AB (ref 0–149)
VLDL Cholesterol Cal: 39 mg/dL (ref 5–40)

## 2017-05-13 ENCOUNTER — Encounter: Payer: Self-pay | Admitting: Family Medicine

## 2017-05-13 ENCOUNTER — Ambulatory Visit (INDEPENDENT_AMBULATORY_CARE_PROVIDER_SITE_OTHER): Payer: 59 | Admitting: Family Medicine

## 2017-05-13 VITALS — BP 136/70 | HR 81 | Temp 97.1°F | Ht 59.0 in | Wt 169.0 lb

## 2017-05-13 DIAGNOSIS — E038 Other specified hypothyroidism: Secondary | ICD-10-CM

## 2017-05-13 DIAGNOSIS — E1169 Type 2 diabetes mellitus with other specified complication: Secondary | ICD-10-CM | POA: Diagnosis not present

## 2017-05-13 DIAGNOSIS — E782 Mixed hyperlipidemia: Secondary | ICD-10-CM

## 2017-05-13 DIAGNOSIS — I10 Essential (primary) hypertension: Secondary | ICD-10-CM | POA: Diagnosis not present

## 2017-05-13 DIAGNOSIS — Z794 Long term (current) use of insulin: Secondary | ICD-10-CM

## 2017-05-13 MED ORDER — DULAGLUTIDE 1.5 MG/0.5ML ~~LOC~~ SOAJ: SUBCUTANEOUS | 2 refills | Status: DC

## 2017-05-13 MED ORDER — OLMESARTAN MEDOXOMIL 20 MG PO TABS: 20.0000 mg | ORAL_TABLET | Freq: Every day | ORAL | 1 refills | Status: DC

## 2017-05-13 MED ORDER — METFORMIN HCL 1000 MG PO TABS: ORAL_TABLET | ORAL | 1 refills | Status: DC

## 2017-05-13 MED ORDER — FENOFIBRIC ACID 135 MG PO CPDR: 1.0000 | DELAYED_RELEASE_CAPSULE | Freq: Every day | ORAL | 5 refills | Status: DC

## 2017-05-13 MED FILL — OLMESARTAN MEDOXOMIL 20 MG: 20 | 90 days supply | Qty: 90 | Fill #0

## 2017-05-13 MED FILL — TRULICITY 1.5 MG/0.5 ML PEN: 1.5 | 28 days supply | Qty: 2 | Fill #0

## 2017-05-13 NOTE — Addendum Note (Signed)
Addended by: Marylin Crosby on: 05/13/2017 10:23 AM   Modules accepted: Orders

## 2017-05-13 NOTE — Progress Notes (Signed)
Subjective:  [name],  female    DOB: Jul 27, 1951  Age: 66 y.o.    CC: Diabetes (pt here today for routine follow up of her chronic medical conditions and also needs something to replace the Valsartan since it has been recalled.)   HPI Morgan Schmidt presents for  follow-up of hypertension. Patient has no history of headache chest pain or shortness of breath or recent cough. Patient also denies symptoms of TIA such as numbness weakness lateralizing. Patient checks  blood pressure at home. Recent readings have been good Patient denies side effects from medication. States taking it regularly. Patient presents for follow-up on  thyroid. The patient has a history of hypothyroidism for many years. It has been stable recently. Pt. denies any change in  voice, loss of hair, heat or cold intolerance. Energy level has been adequate to good. Patient denies constipation and diarrhea. No myxedema. Medication is as noted below. Verified that pt is taking it daily on an empty stomach. Well tolerated.  Follow-up of diabetes. Patient does check blood sugar at home. Readings runUnder 110 fasting and under 180 postprandial Patient denies symptoms such as polyuria, polydipsia, excessive hunger, nausea No significant hypoglycemic spells noted.Couple of minor episodes where she just drank some orange juice occurred that resolved quickly. Medications reviewed. Pt reports taking them regularly. Pt. denies complication/adverse reaction today.    History Morgan Schmidt has a past medical history of Arthritis; Cancer ([place]); Diabetes mellitus; and Thyroid disease.   She has a past surgical history that includes Thyroidectomy (2009); Cesarean section; Knee arthroscopy (Left, 10/22/2016); Knee arthroscopy with medial menisectomy (10/22/2016); and Knee arthroscopy with subchondroplasty (Left, 10/22/2016).   Her family history is not on file.She reports that she quit smoking about 16 years ago. She has never used  smokeless tobacco. She reports that she does not drink alcohol or use drugs.  Current Outpatient Prescriptions on File Prior to Visit  Medication Sig Dispense Refill  . ACCU-CHEK FASTCLIX LANCETS MISC Use to check Blood Sugars twice daily. 102 each 3  . Cholecalciferol (VITAMIN D) 2000 UNITS tablet Take 1 tablet (2,000 Units total) by mouth daily. (Patient taking differently: Take 2,000 Units by mouth every evening. ) 30 tablet 11  . Choline Fenofibrate (FENOFIBRIC ACID) 135 MG CPDR TAKE 1 CAPSULE BY MOUTH DAILY. 30 capsule 5  . diclofenac sodium (VOLTAREN) 1 % GEL Apply topically 4 (four) times daily.    Marland Kitchen ezetimibe (ZETIA) 10 MG tablet TAKE 1 TABLET BY MOUTH ONCE DAILY FOR CHOLESTEROL 90 tablet 3  . glucose blood (ACCU-CHEK GUIDE) test strip Use as instructed 100 each 3  . Insulin Glargine (LANTUS SOLOSTAR) 100 UNIT/ML Solostar Pen Inject 110 Units into the skin every morning. 11 pen PRN  . levothyroxine (SYNTHROID, LEVOTHROID) 125 MCG tablet TAKE 1 TABLET BY MOUTH DAILY. 90 tablet 2  . metFORMIN (GLUCOPHAGE) 1000 MG tablet TAKE 1 TABLET BY MOUTH 2 TIMES DAILY WITH A MEAL. 180 tablet 1  . NOVOFINE 30G X 8 MM MISC USE AS DIRECTED ONCE DAILY 034 each 1  . TRULICITY 1.5 VQ/2.5ZD SOPN INJECT 1 DOSE WEEKLY 2 mL 2  . valsartan (DIOVAN) 160 MG tablet Take 1 tablet (160 mg total) by mouth daily. For Blood pressure 90 tablet 3   No current facility-administered medications on file prior to visit.     ROS Review of Systems  Constitutional: Negative for activity change, appetite change and fever.  HENT: Negative for congestion, rhinorrhea and sore throat.  Eyes: Negative for visual disturbance.  Respiratory: Negative for cough and shortness of breath.   Cardiovascular: Negative for chest pain and palpitations.  Gastrointestinal: Negative for abdominal pain, diarrhea and nausea.  Genitourinary: Negative for dysuria.  Musculoskeletal: Negative for arthralgias and myalgias.    Objective:  BP  136/70   Pulse 81   Temp (!) 97.1 F (36.2 C) (Oral)   Ht 4\' 11"  (1.499 m)   Wt 169 lb (76.7 kg)   LMP 03/02/2004   BMI 34.13 kg/m   BP Readings from Last 3 Encounters:  05/13/17 136/70  02/08/17 121/65  11/08/16 136/67    Wt Readings from Last 3 Encounters:  05/13/17 169 lb (76.7 kg)  02/08/17 170 lb (77.1 kg)  11/08/16 169 lb (76.7 kg)     Physical Exam  Constitutional: She is oriented to person, place, and time. She appears well-developed and well-nourished. No distress.  HENT:  Head: Normocephalic and atraumatic.  Right Ear: External ear normal.  Left Ear: External ear normal.  Nose: Nose normal.  Mouth/Throat: Oropharynx is clear and moist.  Eyes: Pupils are equal, round, and reactive to light. Conjunctivae and EOM are normal.  Neck: Normal range of motion. Neck supple. No thyromegaly present.  Cardiovascular: Normal rate, regular rhythm and normal heart sounds.   No murmur heard. Pulmonary/Chest: Effort normal and breath sounds normal. No respiratory distress. She has no wheezes. She has no rales.  Abdominal: Soft. Bowel sounds are normal. She exhibits no distension. There is no tenderness.  Lymphadenopathy:    She has no cervical adenopathy.  Neurological: She is alert and oriented to person, place, and time. She has normal reflexes.  Skin: Skin is warm and dry.  Psychiatric: She has a normal mood and affect. Her behavior is normal. Judgment and thought content normal.     Assessment & Plan:   Morgan Schmidt was seen today for diabetes.  Diagnoses and all orders for this visit:  Mixed diabetic hyperlipidemia associated with type 2 diabetes mellitus ([place])  Long term current use of insulin ([place])  Essential hypertension  Other specified hypothyroidism   I have discontinued Morgan Schmidt's meloxicam. I am also having her maintain her Vitamin D, diclofenac sodium, ACCU-CHEK FASTCLIX LANCETS, glucose blood, valsartan, Insulin Glargine, ezetimibe, metFORMIN,  Fenofibric Acid, levothyroxine, TRULICITY, and NOVOFINE.  No orders of the defined types were placed in this encounter.    Follow-up: Return in about 3 months (around 08/12/2017).  Claretta Fraise, M.D.

## 2017-05-27 MED FILL — LANTUS SOLOSTAR 100 UNITS/M: 100 | 28 days supply | Qty: 30 | Fill #8

## 2017-06-12 MED FILL — TRULICITY 1.5 MG/0.5 ML PEN: 1.5 | 28 days supply | Qty: 2 | Fill #1

## 2017-06-26 ENCOUNTER — Other Ambulatory Visit: Payer: Self-pay | Admitting: Family Medicine

## 2017-06-26 DIAGNOSIS — Z1231 Encounter for screening mammogram for malignant neoplasm of breast: Secondary | ICD-10-CM

## 2017-06-28 MED FILL — EZETIMIBE 10 MG TABS: 10 | 90 days supply | Qty: 90 | Fill #2

## 2017-06-28 MED FILL — FENOFIBRIC ACID DR 135 MG C: 135 | 30 days supply | Qty: 30 | Fill #3

## 2017-06-28 MED FILL — metFORMIN HCL 1000 MG TABS: 1000 | 90 days supply | Qty: 180 | Fill #0

## 2017-07-02 ENCOUNTER — Encounter: Payer: Self-pay | Admitting: Family Medicine

## 2017-07-02 ENCOUNTER — Ambulatory Visit (INDEPENDENT_AMBULATORY_CARE_PROVIDER_SITE_OTHER): Payer: 59 | Admitting: Family Medicine

## 2017-07-02 VITALS — BP 153/70 | HR 70 | Temp 97.6°F | Ht 59.0 in | Wt 168.4 lb

## 2017-07-02 DIAGNOSIS — J0100 Acute maxillary sinusitis, unspecified: Secondary | ICD-10-CM | POA: Diagnosis not present

## 2017-07-02 MED ORDER — BETAMETHASONE SOD PHOS & ACET 30 MG/5ML IJ KIT: 6.0000 mg | PACK | Freq: Once | INTRAMUSCULAR | Status: DC

## 2017-07-02 MED ORDER — AMOXICILLIN-POT CLAVULANATE 875-125 MG PO TABS: 1.0000 | ORAL_TABLET | Freq: Two times a day (BID) | ORAL | 0 refills | Status: DC

## 2017-07-02 MED ORDER — BETAMETHASONE SOD PHOS & ACET 6 (3-3) MG/ML IJ SUSP
6.0000 mg | Freq: Once | INTRAMUSCULAR | Status: AC
  Administered 2017-07-02: 6 mg via INTRAMUSCULAR

## 2017-07-02 NOTE — Progress Notes (Signed)
Chief Complaint  Patient presents with  . Sinusitis    started Fri night at work, ran fever, broke last pm, chills, OTC tylenol sinus, helped saturday, work sunday night worse yesterday  . Sore Throat  . Ear Pain  . Fever    HPI  Patient presents today for Patient presents with upper respiratory congestion. Rhinorrhea that is frequently purulent. There is moderate sore throat. Patient reports no coughing. There is  fever, chills & sweats. The patient denies being short of breath. Onset was 3-5 days ago. Gradually worsening. Tried OTCs without improvement.  PMH: Smoking status noted ROS: Per HPI  Objective: BP (!) 153/70 (BP Location: Right Arm, Patient Position: Sitting, Cuff Size: Normal)   Pulse 70   Temp 97.6 F (36.4 C) (Oral)   Ht _0  (1.499 m)   Wt 168 lb 6.4 oz (76.4 kg)   LMP 03/02/2004   BMI 34.01 kg/m  Gen: NAD, alert, cooperative with exam HEENT: NCAT, Nasal passages swollen, red TMS normal pharynx red with drainage tracts.  Maxillary sinuses mildly tender CV: RRR, good S1/S2, no murmur Resp: Clear to auscultation Ext: No edema, warm Neuro: Alert and oriented, No gross deficits  Assessment and plan:  1. Acute maxillary sinusitis, recurrence not specified     Meds ordered this encounter  Medications  . amoxicillin-clavulanate (AUGMENTIN) 875-125 MG tablet    Sig: Take 1 tablet 2 (two) times daily by mouth.    Dispense:  20 tablet    Refill:  0  . Betamethasone Sod Phos & Acet 30 MG/5ML KIT 6 mg      Follow up as needed.  Claretta Fraise, MD

## 2017-07-02 NOTE — Addendum Note (Signed)
Addended by: Antonietta Barcelona D on: 07/02/2017 05:50 PM   Modules accepted: Orders

## 2017-07-08 MED FILL — TRULICITY 1.5 MG/0.5 ML PEN: 1.5 | 28 days supply | Qty: 2 | Fill #2

## 2017-07-08 MED FILL — LANTUS SOLOSTAR 100 UNITS/M: 100 | 28 days supply | Qty: 30 | Fill #9

## 2017-07-23 ENCOUNTER — Other Ambulatory Visit: Payer: Self-pay

## 2017-07-23 NOTE — Patient Outreach (Signed)
[facility]) Care Management  07/23/2017  TASHANA HABERL 1951-03-08 [id number]   Case closed in [place].  Member is being followed by the [employer] program Member enrolled in an external program. Peter Garter RN, [facility] Care Management Coordinator-Link to [place] Management [phone]

## 2017-07-25 ENCOUNTER — Ambulatory Visit
Admission: RE | Admit: 2017-07-25 | Discharge: 2017-07-25 | Disposition: A | Discharge status: Home / Self Care | Payer: 59 | Source: Ambulatory Visit | Attending: Family Medicine | Admitting: Family Medicine

## 2017-07-25 DIAGNOSIS — Z1231 Encounter for screening mammogram for malignant neoplasm of breast: Secondary | ICD-10-CM | POA: Diagnosis not present

## 2017-08-05 ENCOUNTER — Other Ambulatory Visit: Payer: Self-pay | Admitting: Family Medicine

## 2017-08-05 MED FILL — LEVOTHYROXINE 125 MCG TAB: 125 | 90 days supply | Qty: 90 | Fill #2

## 2017-08-05 MED FILL — FENOFIBRIC ACID DR 135 MG C: 135 | 30 days supply | Qty: 30 | Fill #0

## 2017-08-05 MED FILL — OLMESARTAN MEDOXOMIL 20 MG: 20 | 90 days supply | Qty: 90 | Fill #1

## 2017-08-06 MED FILL — TRULICITY 1.5 MG/0.5 ML PEN: 1.5 | 28 days supply | Qty: 2 | Fill #0

## 2017-08-08 MED FILL — ACCU-CHEK GUIDE TEST STRIP: 50 days supply | Qty: 100 | Fill #2

## 2017-08-08 MED FILL — LANTUS SOLOSTAR 100 UNITS/M: 100 | 28 days supply | Qty: 30 | Fill #10

## 2017-08-08 MED FILL — ACCU-CHEK FASTCLIX LANCETS: 51 days supply | Qty: 102 | Fill #2

## 2017-08-08 MED FILL — NOVOFINE 30 NEEDLES: 30G X 8 MM | 90 days supply | Qty: 100 | Fill #1

## 2017-08-15 ENCOUNTER — Ambulatory Visit: Payer: 59 | Admitting: Family Medicine

## 2017-08-23 ENCOUNTER — Other Ambulatory Visit: Payer: 59

## 2017-08-23 DIAGNOSIS — E782 Mixed hyperlipidemia: Secondary | ICD-10-CM

## 2017-08-23 DIAGNOSIS — E038 Other specified hypothyroidism: Secondary | ICD-10-CM | POA: Diagnosis not present

## 2017-08-23 DIAGNOSIS — E1169 Type 2 diabetes mellitus with other specified complication: Secondary | ICD-10-CM | POA: Diagnosis not present

## 2017-08-23 LAB — BAYER DCA HB A1C WAIVED: HB A1C (BAYER DCA - WAIVED): 6.6 % (ref <7.0)

## 2017-08-24 LAB — CMP14+EGFR
A/G RATIO: 2.3 — AB (ref 1.2–2.2)
ALT: 24 IU/L (ref 0–32)
AST: 25 IU/L (ref 0–40)
Albumin: 4.4 g/dL (ref 3.6–4.8)
Alkaline Phosphatase: 54 IU/L (ref 39–117)
BILIRUBIN TOTAL: 0.7 mg/dL (ref 0.0–1.2)
BUN / CREAT RATIO: 15 (ref 12–28)
BUN: 10 mg/dL (ref 8–27)
CHLORIDE: 102 mmol/L (ref 96–106)
CO2: 22 mmol/L (ref 20–29)
Calcium: 8.3 mg/dL — ABNORMAL LOW (ref 8.7–10.3)
Creatinine, Ser: 0.68 mg/dL (ref 0.57–1.00)
GFR calc Af Amer: 107 mL/min/{1.73_m2} (ref >59)
GFR calc non Af Amer: 93 mL/min/{1.73_m2} (ref >59)
GLOBULIN, TOTAL: 1.9 g/dL (ref 1.5–4.5)
Glucose: 97 mg/dL (ref 65–99)
POTASSIUM: 4 mmol/L (ref 3.5–5.2)
SODIUM: 143 mmol/L (ref 134–144)
TOTAL PROTEIN: 6.3 g/dL (ref 6.0–8.5)

## 2017-08-24 LAB — CBC WITH DIFFERENTIAL/PLATELET
BASOS ABS: 0.1 10*3/uL (ref 0.0–0.2)
BASOS: 1 %
EOS (ABSOLUTE): 0.2 10*3/uL (ref 0.0–0.4)
Eos: 3 %
Hematocrit: 37.4 % (ref 34.0–46.6)
Hemoglobin: 13 g/dL (ref 11.1–15.9)
Immature Grans (Abs): 0 10*3/uL (ref 0.0–0.1)
Immature Granulocytes: 1 %
LYMPHS ABS: 1.9 10*3/uL (ref 0.7–3.1)
LYMPHS: 32 %
MCH: 27.8 pg (ref 26.6–33.0)
MCHC: 34.8 g/dL (ref 31.5–35.7)
MCV: 80 fL (ref 79–97)
MONOS ABS: 0.4 10*3/uL (ref 0.1–0.9)
Monocytes: 7 %
NEUTROS ABS: 3.4 10*3/uL (ref 1.4–7.0)
Neutrophils: 56 %
PLATELETS: 204 10*3/uL (ref 150–379)
RBC: 4.68 x10E6/uL (ref 3.77–5.28)
RDW: 14 % (ref 12.3–15.4)
WBC: 6 10*3/uL (ref 3.4–10.8)

## 2017-08-24 LAB — T4 AND TSH
T4, Total: 14.4 ug/dL — ABNORMAL HIGH (ref 4.5–12.0)
TSH: 0.477 u[IU]/mL (ref 0.450–4.500)

## 2017-08-24 LAB — LIPID PANEL
CHOL/HDL RATIO: 4.6 ratio — AB (ref 0.0–4.4)
CHOLESTEROL TOTAL: 172 mg/dL (ref 100–199)
HDL: 37 mg/dL — AB (ref >39)
LDL Calculated: 106 mg/dL — ABNORMAL HIGH (ref 0–99)
TRIGLYCERIDES: 144 mg/dL (ref 0–149)
VLDL Cholesterol Cal: 29 mg/dL (ref 5–40)

## 2017-08-26 ENCOUNTER — Encounter: Payer: Self-pay | Admitting: Family Medicine

## 2017-08-26 ENCOUNTER — Ambulatory Visit (INDEPENDENT_AMBULATORY_CARE_PROVIDER_SITE_OTHER): Payer: 59 | Admitting: Family Medicine

## 2017-08-26 VITALS — BP 120/61 | HR 80 | Temp 97.5°F | Ht 59.0 in | Wt 166.0 lb

## 2017-08-26 DIAGNOSIS — E1169 Type 2 diabetes mellitus with other specified complication: Secondary | ICD-10-CM

## 2017-08-26 DIAGNOSIS — E782 Mixed hyperlipidemia: Secondary | ICD-10-CM

## 2017-08-26 DIAGNOSIS — E039 Hypothyroidism, unspecified: Secondary | ICD-10-CM | POA: Diagnosis not present

## 2017-08-26 DIAGNOSIS — I10 Essential (primary) hypertension: Secondary | ICD-10-CM

## 2017-08-26 DIAGNOSIS — E119 Type 2 diabetes mellitus without complications: Secondary | ICD-10-CM

## 2017-08-26 DIAGNOSIS — Z794 Long term (current) use of insulin: Secondary | ICD-10-CM

## 2017-08-26 MED ORDER — LEVOTHYROXINE SODIUM 125 MCG PO TABS: 125.0000 ug | ORAL_TABLET | Freq: Every day | ORAL | 2 refills | Status: DC

## 2017-08-26 MED ORDER — DULAGLUTIDE 1.5 MG/0.5ML ~~LOC~~ SOAJ: SUBCUTANEOUS | 3 refills | Status: DC

## 2017-08-26 MED ORDER — OLMESARTAN MEDOXOMIL 20 MG PO TABS: 20.0000 mg | ORAL_TABLET | Freq: Every day | ORAL | 1 refills | Status: DC

## 2017-08-26 MED ORDER — INSULIN PEN NEEDLE 30G X 8 MM MISC: 1 refills | Status: DC

## 2017-08-26 MED ORDER — INSULIN GLARGINE 100 UNIT/ML SOLOSTAR PEN: 110.0000 [IU] | PEN_INJECTOR | SUBCUTANEOUS | 99 refills | Status: DC

## 2017-08-26 MED ORDER — METFORMIN HCL 1000 MG PO TABS: ORAL_TABLET | ORAL | 1 refills | Status: DC

## 2017-08-26 NOTE — Progress Notes (Signed)
Subjective:  [name],  female    DOB: 30-Nov-1950  Age: 67 y.o.    CC: Diabetes (pt here today for routine follow up of Morgan Schmidt chronic medical conditions, no other concerns voiced.)   HPI Morgan Schmidt presents for  follow-up of hypertension. Patient Schmidt no history of headache chest pain or shortness of breath or recent cough. Patient also Schmidt symptoms of TIA such as numbness weakness lateralizing. Patient checks  blood pressure at home. Recent readings have been good Patient Schmidt side effects from medication. States taking it regularly.  Patient also  in for follow-up of elevated cholesterol. Doing well without complaints on current medication. Schmidt side effects of statin including myalgia and arthralgia and nausea. Also in today for liver function testing. Currently no chest pain, shortness of breath or other cardiovascular related symptoms noted.  Follow-up of diabetes. Patient does check blood sugar at home. Readings run between 106  and <180 Patient Schmidt symptoms such as polyuria, polydipsia, excessive hunger, nausea No significant hypoglycemic spells noted. Medications reviewed. Pt Schmidt taking them regularly. Pt. Schmidt complication/adverse reaction today.    History Morgan Schmidt Schmidt a past medical history of Arthritis, Cancer ([place]), Diabetes mellitus, and Thyroid disease.   Morgan Schmidt a past surgical history that includes Thyroidectomy (2009); Cesarean section; Knee arthroscopy (Left, 10/22/2016); Knee arthroscopy with medial menisectomy (10/22/2016); and Knee arthroscopy with subchondroplasty (Left, 10/22/2016).   Morgan Schmidt family history Schmidt not on file.Morgan Schmidt that Morgan Schmidt quit smoking about 17 years ago. Morgan Schmidt never used smokeless tobacco. Morgan Schmidt that Morgan Schmidt does not drink alcohol or use drugs.  Current Outpatient Medications on File Prior to Visit  Medication Sig Dispense Refill  . ACCU-CHEK FASTCLIX LANCETS MISC Use to check Blood Sugars twice daily. 102  each 3  . Cholecalciferol (VITAMIN D) 2000 UNITS tablet Take 1 tablet (2,000 Units total) by mouth daily. (Patient taking differently: Take 2,000 Units by mouth every evening. ) 30 tablet 11  . Choline Fenofibrate (FENOFIBRIC ACID) 135 MG CPDR Take 1 capsule by mouth daily. 30 capsule 5  . diclofenac sodium (VOLTAREN) 1 % GEL Apply topically 4 (four) times daily.    Marland Kitchen ezetimibe (ZETIA) 10 MG tablet TAKE 1 TABLET BY MOUTH ONCE DAILY FOR CHOLESTEROL 90 tablet 3  . glucose blood (ACCU-CHEK GUIDE) test strip Use as instructed 100 each 3   No current facility-administered medications on file prior to visit.     ROS Review of Systems  Constitutional: Negative for activity change, appetite change and fever.  HENT: Negative for congestion, rhinorrhea and sore throat.   Eyes: Negative for visual disturbance.  Respiratory: Negative for cough and shortness of breath.   Cardiovascular: Negative for chest pain and palpitations.  Gastrointestinal: Negative for abdominal pain, diarrhea and nausea.  Genitourinary: Negative for dysuria.  Musculoskeletal: Negative for arthralgias and myalgias.    Objective:  BP 120/61   Pulse 80   Temp (!) 97.5 F (36.4 C) (Oral)   Ht 4' 11"  (1.499 m)   Wt 166 lb (75.3 kg)   LMP 03/02/2004   BMI 33.53 kg/m   BP Readings from Last 3 Encounters:  08/26/17 120/61  07/02/17 (!) 153/70  05/13/17 136/70    Wt Readings from Last 3 Encounters:  08/26/17 166 lb (75.3 kg)  07/02/17 168 lb 6.4 oz (76.4 kg)  05/13/17 169 lb (76.7 kg)     Physical Exam  Constitutional: Morgan Schmidt oriented to person, place, and time. Morgan Schmidt well-developed  and well-nourished. No distress.  HENT:  Head: Normocephalic and atraumatic.  Right Ear: External ear normal.  Left Ear: External ear normal.  Nose: Nose normal.  Mouth/Throat: Oropharynx Schmidt clear and moist.  Eyes: Conjunctivae and EOM are normal. Pupils are equal, round, and reactive to light.  Neck: Normal range of motion.  Neck supple. No thyromegaly present.  Cardiovascular: Normal rate, regular rhythm and normal heart sounds.  No murmur heard. Pulmonary/Chest: Effort normal and breath sounds normal. No respiratory distress. Morgan Schmidt no wheezes. Morgan Schmidt no rales.  Abdominal: Soft. Bowel sounds are normal. Morgan Schmidt exhibits no distension. There Schmidt no tenderness.  Lymphadenopathy:    Morgan Schmidt no cervical adenopathy.  Neurological: Morgan Schmidt alert and oriented to person, place, and time. Morgan Schmidt normal reflexes.  Skin: Skin Schmidt warm and dry.  Psychiatric: Morgan Schmidt a normal mood and affect. Morgan Schmidt Schmidt normal. Judgment and thought content normal.    Results for orders placed or performed in visit on 08/23/17  T4 AND TSH  Result Value Ref Range   TSH 0.477 0.450 - 4.500 uIU/mL   T4, Total 14.4 (H) 4.5 - 12.0 ug/dL  Lipid panel  Result Value Ref Range   Cholesterol, Total 172 100 - 199 mg/dL   Triglycerides 144 0 - 149 mg/dL   HDL 37 (L) >39 mg/dL   VLDL Cholesterol Cal 29 5 - 40 mg/dL   LDL Calculated 106 (H) 0 - 99 mg/dL   Chol/HDL Ratio 4.6 (H) 0.0 - 4.4 ratio  CMP14+EGFR  Result Value Ref Range   Glucose 97 65 - 99 mg/dL   BUN 10 8 - 27 mg/dL   Creatinine, Ser 0.68 0.57 - 1.00 mg/dL   GFR calc non Af Amer 93 >59 mL/min/1.73   GFR calc Af Amer 107 >59 mL/min/1.73   BUN/Creatinine Ratio 15 12 - 28   Sodium 143 134 - 144 mmol/L   Potassium 4.0 3.5 - 5.2 mmol/L   Chloride 102 96 - 106 mmol/L   CO2 22 20 - 29 mmol/L   Calcium 8.3 (L) 8.7 - 10.3 mg/dL   Total Protein 6.3 6.0 - 8.5 g/dL   Albumin 4.4 3.6 - 4.8 g/dL   Globulin, Total 1.9 1.5 - 4.5 g/dL   Albumin/Globulin Ratio 2.3 (H) 1.2 - 2.2   Bilirubin Total 0.7 0.0 - 1.2 mg/dL   Alkaline Phosphatase 54 39 - 117 IU/L   AST 25 0 - 40 IU/L   ALT 24 0 - 32 IU/L  CBC with Differential/Platelet  Result Value Ref Range   WBC 6.0 3.4 - 10.8 x10E3/uL   RBC 4.68 3.77 - 5.28 x10E6/uL   Hemoglobin 13.0 11.1 - 15.9 g/dL   Hematocrit 37.4 34.0 - 46.6 %   MCV  80 79 - 97 fL   MCH 27.8 26.6 - 33.0 pg   MCHC 34.8 31.5 - 35.7 g/dL   RDW 14.0 12.3 - 15.4 %   Platelets 204 150 - 379 x10E3/uL   Neutrophils 56 Not Estab. %   Lymphs 32 Not Estab. %   Monocytes 7 Not Estab. %   Eos 3 Not Estab. %   Basos 1 Not Estab. %   Neutrophils Absolute 3.4 1.4 - 7.0 x10E3/uL   Lymphocytes Absolute 1.9 0.7 - 3.1 x10E3/uL   Monocytes Absolute 0.4 0.1 - 0.9 x10E3/uL   EOS (ABSOLUTE) 0.2 0.0 - 0.4 x10E3/uL   Basophils Absolute 0.1 0.0 - 0.2 x10E3/uL   Immature Granulocytes 1 Not Estab. %  Immature Grans (Abs) 0.0 0.0 - 0.1 x10E3/uL  Bayer DCA Hb A1c Waived  Result Value Ref Range   Bayer DCA Hb A1c Waived 6.6 <7.0 %       Assessment & Plan:   Rhylen was seen today for diabetes.  Diagnoses and all orders for this visit:  Essential hypertension  Hypothyroidism -     levothyroxine (SYNTHROID, LEVOTHROID) 125 MCG tablet; Take 1 tablet (125 mcg total) by mouth daily.  Mixed diabetic hyperlipidemia associated with type 2 diabetes mellitus ([place])  Type 2 diabetes mellitus without complication, with long-term current use of insulin ([facility])  Other orders -     Insulin Glargine (LANTUS SOLOSTAR) 100 UNIT/ML Solostar Pen; Inject 110 Units into the skin every morning. -     metFORMIN (GLUCOPHAGE) 1000 MG tablet; TAKE 1 TABLET BY MOUTH 2 TIMES DAILY WITH A MEAL. -     Insulin Pen Needle (NOVOFINE) 30G X 8 MM MISC; use as directed -     olmesartan (BENICAR) 20 MG tablet; Take 1 tablet (20 mg total) by mouth daily. -     Dulaglutide (TRULICITY) 1.5 AU/4.5VP SOPN; INJECT 1 PEN INTO THE SKIN WEEKLY   Morgan Schmidt amoxicillin-clavulanate. Morgan have changed Morgan Schmidt NOVOFINE to Insulin Pen Needle and TRULICITY to Dulaglutide. Morgan have also changed Morgan Schmidt levothyroxine. Additionally, Morgan am having Morgan Schmidt maintain Morgan Schmidt Vitamin D, diclofenac sodium, ACCU-CHEK FASTCLIX LANCETS, glucose blood, ezetimibe, Fenofibric Acid, Insulin Glargine, metFORMIN, and  olmesartan.  Meds ordered this encounter  Medications  . levothyroxine (SYNTHROID, LEVOTHROID) 125 MCG tablet    Sig: Take 1 tablet (125 mcg total) by mouth daily.    Dispense:  90 tablet    Refill:  2  . Insulin Glargine (LANTUS SOLOSTAR) 100 UNIT/ML Solostar Pen    Sig: Inject 110 Units into the skin every morning.    Dispense:  11 pen    Refill:  PRN  . metFORMIN (GLUCOPHAGE) 1000 MG tablet    Sig: TAKE 1 TABLET BY MOUTH 2 TIMES DAILY WITH A MEAL.    Dispense:  180 tablet    Refill:  1  . Insulin Pen Needle (NOVOFINE) 30G X 8 MM MISC    Sig: use as directed    Dispense:  100 each    Refill:  1    E11.9  . olmesartan (BENICAR) 20 MG tablet    Sig: Take 1 tablet (20 mg total) by mouth daily.    Dispense:  90 tablet    Refill:  1  . Dulaglutide (TRULICITY) 1.5 LW/8.5RV SOPN    Sig: INJECT 1 PEN INTO THE SKIN WEEKLY    Dispense:  12 mL    Refill:  3     Follow-up: No Follow-up on file.  Claretta Fraise, M.D.

## 2017-08-26 NOTE — Patient Instructions (Signed)
Start taking Calcium 600mg  twice daily (otc)

## 2017-09-05 MED FILL — TRULICITY 1.5 MG/0.5 ML PEN: 1.5 | 28 days supply | Qty: 2 | Fill #1

## 2017-09-23 MED FILL — FENOFIBRIC ACID 135 MG CPDR: 135 | 30 days supply | Qty: 30 | Fill #1

## 2017-09-23 MED FILL — LANTUS SOLOSTAR 100 UNITS/M: 100 | 28 days supply | Qty: 30 | Fill #0

## 2017-09-23 MED FILL — EZETIMIBE 10 MG TABS: 10 | 90 days supply | Qty: 90 | Fill #3

## 2017-09-23 MED FILL — metFORMIN HCL 1000 MG TABS: 1000 | 90 days supply | Qty: 180 | Fill #1

## 2017-10-07 MED FILL — TRULICITY 1.5 MG/0.5 ML PEN: 1.5 | 28 days supply | Qty: 2 | Fill #2

## 2017-10-29 MED FILL — TRULICITY 1.5 MG/0.5 ML PEN: 1.5 | 84 days supply | Qty: 6 | Fill #0

## 2017-11-05 ENCOUNTER — Other Ambulatory Visit: Payer: Self-pay | Admitting: Family Medicine

## 2017-11-05 MED FILL — LANTUS SOLOSTAR 100 UNITS/M: 100 | 28 days supply | Qty: 30 | Fill #1

## 2017-11-05 MED FILL — FENOFIBRIC ACID 135 MG CPDR: 135 | 30 days supply | Qty: 30 | Fill #2

## 2017-11-05 MED FILL — LEVOTHYROXINE 125 MCG TAB: 125 | 90 days supply | Qty: 90 | Fill #0

## 2017-11-25 ENCOUNTER — Ambulatory Visit: Payer: 59 | Admitting: Family Medicine

## 2017-11-28 ENCOUNTER — Other Ambulatory Visit: Payer: 59

## 2017-11-28 DIAGNOSIS — E1169 Type 2 diabetes mellitus with other specified complication: Secondary | ICD-10-CM | POA: Diagnosis not present

## 2017-11-28 DIAGNOSIS — E782 Mixed hyperlipidemia: Secondary | ICD-10-CM | POA: Diagnosis not present

## 2017-11-28 DIAGNOSIS — E038 Other specified hypothyroidism: Secondary | ICD-10-CM

## 2017-11-28 LAB — BAYER DCA HB A1C WAIVED: HB A1C (BAYER DCA - WAIVED): 8.2 % — ABNORMAL HIGH (ref <7.0)

## 2017-11-29 ENCOUNTER — Ambulatory Visit (INDEPENDENT_AMBULATORY_CARE_PROVIDER_SITE_OTHER): Payer: 59 | Admitting: Family Medicine

## 2017-11-29 ENCOUNTER — Encounter: Payer: Self-pay | Admitting: Family Medicine

## 2017-11-29 VITALS — BP 100/58 | HR 75 | Temp 97.7°F | Ht 59.0 in | Wt 169.0 lb

## 2017-11-29 DIAGNOSIS — I10 Essential (primary) hypertension: Secondary | ICD-10-CM | POA: Diagnosis not present

## 2017-11-29 DIAGNOSIS — E782 Mixed hyperlipidemia: Secondary | ICD-10-CM | POA: Diagnosis not present

## 2017-11-29 DIAGNOSIS — E038 Other specified hypothyroidism: Secondary | ICD-10-CM

## 2017-11-29 DIAGNOSIS — R0989 Other specified symptoms and signs involving the circulatory and respiratory systems: Secondary | ICD-10-CM | POA: Diagnosis not present

## 2017-11-29 DIAGNOSIS — Z794 Long term (current) use of insulin: Secondary | ICD-10-CM

## 2017-11-29 DIAGNOSIS — E1169 Type 2 diabetes mellitus with other specified complication: Secondary | ICD-10-CM

## 2017-11-29 LAB — CMP14+EGFR
ALBUMIN: 4.6 g/dL (ref 3.6–4.8)
ALT: 25 IU/L (ref 0–32)
AST: 28 IU/L (ref 0–40)
Albumin/Globulin Ratio: 2.3 — ABNORMAL HIGH (ref 1.2–2.2)
Alkaline Phosphatase: 58 IU/L (ref 39–117)
BUN / CREAT RATIO: 13 (ref 12–28)
BUN: 11 mg/dL (ref 8–27)
Bilirubin Total: 0.6 mg/dL (ref 0.0–1.2)
CALCIUM: 9 mg/dL (ref 8.7–10.3)
CO2: 23 mmol/L (ref 20–29)
CREATININE: 0.83 mg/dL (ref 0.57–1.00)
Chloride: 100 mmol/L (ref 96–106)
GFR calc Af Amer: 86 mL/min/{1.73_m2} (ref >59)
GFR, EST NON AFRICAN AMERICAN: 75 mL/min/{1.73_m2} (ref >59)
GLOBULIN, TOTAL: 2 g/dL (ref 1.5–4.5)
Glucose: 81 mg/dL (ref 65–99)
Potassium: 4.2 mmol/L (ref 3.5–5.2)
SODIUM: 141 mmol/L (ref 134–144)
Total Protein: 6.6 g/dL (ref 6.0–8.5)

## 2017-11-29 LAB — CBC WITH DIFFERENTIAL/PLATELET
Basophils Absolute: 0.1 10*3/uL (ref 0.0–0.2)
Basos: 1 %
EOS (ABSOLUTE): 0.2 10*3/uL (ref 0.0–0.4)
EOS: 3 %
HEMATOCRIT: 42.4 % (ref 34.0–46.6)
Hemoglobin: 14.3 g/dL (ref 11.1–15.9)
Immature Grans (Abs): 0 10*3/uL (ref 0.0–0.1)
Immature Granulocytes: 0 %
LYMPHS ABS: 3.1 10*3/uL (ref 0.7–3.1)
Lymphs: 37 %
MCH: 27.3 pg (ref 26.6–33.0)
MCHC: 33.7 g/dL (ref 31.5–35.7)
MCV: 81 fL (ref 79–97)
MONOS ABS: 0.5 10*3/uL (ref 0.1–0.9)
Monocytes: 7 %
NEUTROS ABS: 4.4 10*3/uL (ref 1.4–7.0)
Neutrophils: 52 %
Platelets: 256 10*3/uL (ref 150–379)
RBC: 5.24 x10E6/uL (ref 3.77–5.28)
RDW: 14.3 % (ref 12.3–15.4)
WBC: 8.4 10*3/uL (ref 3.4–10.8)

## 2017-11-29 LAB — LIPID PANEL
CHOLESTEROL TOTAL: 143 mg/dL (ref 100–199)
Chol/HDL Ratio: 4.1 ratio (ref 0.0–4.4)
HDL: 35 mg/dL — ABNORMAL LOW (ref >39)
LDL CALC: 75 mg/dL (ref 0–99)
TRIGLYCERIDES: 163 mg/dL — AB (ref 0–149)
VLDL Cholesterol Cal: 33 mg/dL (ref 5–40)

## 2017-11-29 LAB — T4 AND TSH
T4, Total: 14 ug/dL — ABNORMAL HIGH (ref 4.5–12.0)
TSH: 1.18 u[IU]/mL (ref 0.450–4.500)

## 2017-11-29 MED ORDER — FENOFIBRIC ACID 135 MG PO CPDR: 1.0000 | DELAYED_RELEASE_CAPSULE | Freq: Every day | ORAL | 5 refills | Status: DC

## 2017-11-29 MED ORDER — GLUCOSE BLOOD VI STRP: ORAL_STRIP | 3 refills | Status: DC

## 2017-11-29 MED ORDER — EZETIMIBE 10 MG PO TABS: ORAL_TABLET | ORAL | 3 refills | Status: DC

## 2017-11-29 MED ORDER — OLMESARTAN MEDOXOMIL 20 MG PO TABS: 20.0000 mg | ORAL_TABLET | Freq: Every day | ORAL | 0 refills | Status: DC

## 2017-11-29 MED FILL — ACCU-CHEK GUIDE STRP: 50 days supply | Qty: 100 | Fill #0

## 2017-11-29 MED FILL — OLMESARTAN MEDOXOMIL 20 MG: 20 | 90 days supply | Qty: 90 | Fill #0

## 2017-11-29 MED FILL — FENOFIBRIC ACID 135 MG CPDR: 135 | 30 days supply | Qty: 30 | Fill #0

## 2017-11-29 NOTE — Patient Instructions (Signed)
Use Allegra D 24 in the evening.  Start a Food diary.   Carbohydrate Counting for Diabetes Mellitus, Adult Carbohydrate counting is a method for keeping track of how many carbohydrates you eat. Eating carbohydrates naturally increases the amount of sugar (glucose) in the blood. Counting how many carbohydrates you eat helps keep your blood glucose within normal limits, which helps you manage your diabetes (diabetes mellitus). It is important to know how many carbohydrates you can safely have in each meal. This is different for every person. A diet and nutrition specialist (registered dietitian) can help you make a meal plan and calculate how many carbohydrates you should have at each meal and snack. Carbohydrates are found in the following foods:  Grains, such as breads and cereals.  Dried beans and soy products.  Starchy vegetables, such as potatoes, peas, and corn.  Fruit and fruit juices.  Milk and yogurt.  Sweets and snack foods, such as cake, cookies, candy, chips, and soft drinks.  How do I count carbohydrates? There are two ways to count carbohydrates in food. You can use either of the methods or a combination of both. Reading "Nutrition Facts" on packaged food The "Nutrition Facts" list is included on the labels of almost all packaged foods and beverages in the [place] It includes:  The serving size.  Information about nutrients in each serving, including the grams (g) of carbohydrate per serving.  To use the "Nutrition Facts":  Decide how many servings you will have.  Multiply the number of servings by the number of carbohydrates per serving.  The resulting number is the total amount of carbohydrates that you will be having.  Learning standard serving sizes of other foods When you eat foods containing carbohydrates that are not packaged or do not include "Nutrition Facts" on the label, you need to measure the servings in order to count the amount of  carbohydrates:  Measure the foods that you will eat with a food scale or measuring cup, if needed.  Decide how many standard-size servings you will eat.  Multiply the number of servings by 15. Most carbohydrate-rich foods have about 15 g of carbohydrates per serving. ? For example, if you eat 8 oz (170 g) of strawberries, you will have eaten 2 servings and 30 g of carbohydrates (2 servings x 15 g = 30 g).  For foods that have more than one food mixed, such as soups and casseroles, you must count the carbohydrates in each food that is included.  The following list contains standard serving sizes of common carbohydrate-rich foods. Each of these servings has about 15 g of carbohydrates:   hamburger bun or  English muffin.   oz (15 mL) syrup.   oz (14 g) jelly.  1 slice of bread.  1 six-inch tortilla.  3 oz (85 g) cooked rice or pasta.  4 oz (113 g) cooked dried beans.  4 oz (113 g) starchy vegetable, such as peas, corn, or potatoes.  4 oz (113 g) hot cereal.  4 oz (113 g) mashed potatoes or  of a large baked potato.  4 oz (113 g) canned or frozen fruit.  4 oz (120 mL) fruit juice.  4-6 crackers.  6 chicken nuggets.  6 oz (170 g) unsweetened dry cereal.  6 oz (170 g) plain fat-free yogurt or yogurt sweetened with artificial sweeteners.  8 oz (240 mL) milk.  8 oz (170 g) fresh fruit or one small piece of fruit.  24 oz (680 g) popped popcorn.  Example of carbohydrate counting Sample meal  3 oz (85 g) chicken breast.  6 oz (170 g) brown rice.  4 oz (113 g) corn.  8 oz (240 mL) milk.  8 oz (170 g) strawberries with sugar-free whipped topping. Carbohydrate calculation 1. Identify the foods that contain carbohydrates: ? Rice. ? Corn. ? Milk. ? Strawberries. 2. Calculate how many servings you have of each food: ? 2 servings rice. ? 1 serving corn. ? 1 serving milk. ? 1 serving strawberries. 3. Multiply each number of servings by 15 g: ? 2 servings  rice x 15 g = 30 g. ? 1 serving corn x 15 g = 15 g. ? 1 serving milk x 15 g = 15 g. ? 1 serving strawberries x 15 g = 15 g. 4. Add together all of the amounts to find the total grams of carbohydrates eaten: ? 30 g + 15 g + 15 g + 15 g = 75 g of carbohydrates total. This information is not intended to replace advice given to you by your health care provider. Make sure you discuss any questions you have with your health care provider. Document Released: 08/06/2005 Document Revised: 02/24/2016 Document Reviewed: 01/18/2016 Elsevier Interactive Patient Education  2018 [employer]  

## 2017-12-01 ENCOUNTER — Encounter: Payer: Self-pay | Admitting: Family Medicine

## 2017-12-01 NOTE — Progress Notes (Signed)
Subjective:  [name],  female    DOB: 1951/01/31  Age: 67 y.o.    CC: Hypertension (pt here today for routine follow up of her chronic medical conditions)   HPI Morgan Schmidt presents for  follow-up of hypertension. Patient has no history of headache chest pain or shortness of breath or recent cough. Patient also denies symptoms of TIA such as numbness weakness lateralizing. Patient denies side effects from medication. States taking it regularly.  Patient also  in for follow-up of elevated cholesterol. Doing well without complaints on current medication. Denies side effects  including myalgia and arthralgia and nausea. Also in today for liver function testing. Currently no chest pain, shortness of breath or other cardiovascular related symptoms noted.  Follow-up of diabetes. Patient does check blood sugar at home. Readings run between 100 and 120 fasting and 150-160 postprandial Patient denies symptoms such as excessive hunger or urinary frequency, excessive hunger, nausea No significant hypoglycemic spells noted. Medications reviewed. Pt reports taking them regularly. Pt. denies complication/adverse reaction today.   Patient has allergic rhinitis symptoms including sneezing frequently sniffling, clear rhinorrhea, watery and itchy eyes. There has been no fever no chills no sweats. No earaches. There is some scratchy throat but no sore throat or difficulty swallowing. There is some nasal congestion.  She has had some frontal headache as well.  History Morgan Schmidt has a past medical history of Arthritis, Cancer ([place]), Diabetes mellitus, and Thyroid disease.   She has a past surgical history that includes Thyroidectomy (2009); Cesarean section; Knee arthroscopy (Left, 10/22/2016); Knee arthroscopy with medial menisectomy (10/22/2016); and Knee arthroscopy with subchondroplasty (Left, 10/22/2016).   Her family history is not on file.She reports that she quit smoking about 17  years ago. She has never used smokeless tobacco. She reports that she does not drink alcohol or use drugs.  Current Outpatient Medications on File Prior to Visit  Medication Sig Dispense Refill  . ACCU-CHEK FASTCLIX LANCETS MISC Use to check Blood Sugars twice daily. 102 each 3  . Cholecalciferol (VITAMIN D) 2000 UNITS tablet Take 1 tablet (2,000 Units total) by mouth daily. (Patient taking differently: Take 2,000 Units by mouth every evening. ) 30 tablet 11  . diclofenac sodium (VOLTAREN) 1 % GEL Apply topically 4 (four) times daily.    . Dulaglutide (TRULICITY) 1.5 NW/2.9FA SOPN INJECT 1 PEN INTO THE SKIN WEEKLY 12 mL 3  . Insulin Glargine (LANTUS SOLOSTAR) 100 UNIT/ML Solostar Pen Inject 110 Units into the skin every morning. 11 pen PRN  . Insulin Pen Needle (NOVOFINE) 30G X 8 MM MISC use as directed 100 each 1  . levothyroxine (SYNTHROID, LEVOTHROID) 125 MCG tablet Take 1 tablet (125 mcg total) by mouth daily. 90 tablet 2  . metFORMIN (GLUCOPHAGE) 1000 MG tablet TAKE 1 TABLET BY MOUTH 2 TIMES DAILY WITH A MEAL. 180 tablet 1   No current facility-administered medications on file prior to visit.     ROS Review of Systems  Constitutional: Negative.   HENT: Positive for congestion, postnasal drip, rhinorrhea and sneezing.   Eyes: Negative for visual disturbance.  Respiratory: Negative for shortness of breath.   Cardiovascular: Negative for chest pain.  Gastrointestinal: Negative for abdominal pain, constipation, diarrhea, nausea and vomiting.  Genitourinary: Negative for difficulty urinating.  Musculoskeletal: Negative for arthralgias and myalgias.  Neurological: Positive for headaches.  Psychiatric/Behavioral: Negative for sleep disturbance.    Objective:  BP (!) 100/58   Pulse 75   Temp 97.7 F (36.5  C) (Oral)   Ht 4\' 11"  (1.499 m)   Wt 169 lb (76.7 kg)   LMP 03/02/2004   BMI 34.13 kg/m   BP Readings from Last 3 Encounters:  11/29/17 (!) 100/58  08/26/17 120/61  07/02/17  (!) 153/70    Wt Readings from Last 3 Encounters:  11/29/17 169 lb (76.7 kg)  08/26/17 166 lb (75.3 kg)  07/02/17 168 lb 6.4 oz (76.4 kg)     Physical Exam  Constitutional: She is oriented to person, place, and time. She appears well-developed and well-nourished. No distress.  HENT:  Head: Normocephalic and atraumatic.  Right Ear: External ear normal.  Left Ear: External ear normal.  Nose: Nose normal.  Mouth/Throat: Oropharynx is clear and moist.  Eyes: Pupils are equal, round, and reactive to light. Conjunctivae and EOM are normal.  Neck: Normal range of motion. Neck supple. No thyromegaly present.  Cardiovascular: Normal rate, regular rhythm and normal heart sounds.  No murmur heard. Pulses:      Carotid pulses are 2+ on the right side with bruit. Pulmonary/Chest: Effort normal and breath sounds normal. No respiratory distress. She has no wheezes. She has no rales.  Abdominal: Soft. Bowel sounds are normal. She exhibits no distension. There is no tenderness.  Lymphadenopathy:    She has no cervical adenopathy.  Neurological: She is alert and oriented to person, place, and time. She has normal reflexes.  Skin: Skin is warm and dry.  Psychiatric: She has a normal mood and affect. Her behavior is normal. Judgment and thought content normal.    Assessment & Plan:   [name] was seen today for hypertension.  Diagnoses and all orders for this visit:  Mixed diabetic hyperlipidemia associated with type 2 diabetes mellitus ([facility])  Carotid bruit, unspecified laterality -     US Carotid Duplex Bilateral; Future  Essential hypertension  Other specified hypothyroidism  Long term current use of insulin ([facility])  Other orders -     Choline Fenofibrate (FENOFIBRIC ACID) 135 MG CPDR; Take 1 capsule by mouth daily. -     ezetimibe (ZETIA) 10 MG tablet; TAKE 1 TABLET BY MOUTH ONCE DAILY FOR CHOLESTEROL -     glucose blood (ACCU-CHEK GUIDE) test strip; Use as instructed -      olmesartan (BENICAR) 20 MG tablet; Take 1 tablet (20 mg total) by mouth daily.   I am having Salomon Mast. Nembhard maintain her Vitamin D, diclofenac sodium, ACCU-CHEK FASTCLIX LANCETS, levothyroxine, Insulin Glargine, metFORMIN, Insulin Pen Needle, Dulaglutide, Fenofibric Acid, ezetimibe, glucose blood, and olmesartan.  Meds ordered this encounter  Medications  . Choline Fenofibrate (FENOFIBRIC ACID) 135 MG CPDR    Sig: Take 1 capsule by mouth daily.    Dispense:  30 capsule    Refill:  5  . ezetimibe (ZETIA) 10 MG tablet    Sig: TAKE 1 TABLET BY MOUTH ONCE DAILY FOR CHOLESTEROL    Dispense:  90 tablet    Refill:  3  . glucose blood (ACCU-CHEK GUIDE) test strip    Sig: Use as instructed    Dispense:  100 each    Refill:  3  . olmesartan (BENICAR) 20 MG tablet    Sig: Take 1 tablet (20 mg total) by mouth daily.    Dispense:  90 tablet    Refill:  0     Follow-up: No follow-ups on file.  Claretta Fraise, M.D.

## 2017-12-02 ENCOUNTER — Other Ambulatory Visit: Payer: Self-pay

## 2017-12-02 DIAGNOSIS — R0989 Other specified symptoms and signs involving the circulatory and respiratory systems: Secondary | ICD-10-CM

## 2017-12-04 ENCOUNTER — Ambulatory Visit (HOSPITAL_COMMUNITY): Payer: 59

## 2017-12-05 ENCOUNTER — Other Ambulatory Visit: Payer: Self-pay | Admitting: Family Medicine

## 2017-12-05 MED FILL — UNIFINE PENTIPS 8MM 31G: 31G X 8 MM | 90 days supply | Qty: 100 | Fill #0

## 2017-12-05 MED FILL — LANTUS SOLOSTAR 100 UNITS/M: 100 | 28 days supply | Qty: 30 | Fill #0

## 2017-12-10 ENCOUNTER — Ambulatory Visit (HOSPITAL_COMMUNITY)
Admission: RE | Admit: 2017-12-10 | Discharge: 2017-12-10 | Disposition: A | Discharge status: Home / Self Care | Payer: 59 | Source: Ambulatory Visit | Attending: Family Medicine | Admitting: Family Medicine

## 2017-12-10 DIAGNOSIS — E785 Hyperlipidemia, unspecified: Secondary | ICD-10-CM | POA: Insufficient documentation

## 2017-12-10 DIAGNOSIS — I6523 Occlusion and stenosis of bilateral carotid arteries: Secondary | ICD-10-CM | POA: Insufficient documentation

## 2017-12-10 DIAGNOSIS — R0989 Other specified symptoms and signs involving the circulatory and respiratory systems: Secondary | ICD-10-CM | POA: Insufficient documentation

## 2017-12-10 DIAGNOSIS — I10 Essential (primary) hypertension: Secondary | ICD-10-CM | POA: Diagnosis not present

## 2017-12-12 DIAGNOSIS — H5213 Myopia, bilateral: Secondary | ICD-10-CM | POA: Diagnosis not present

## 2018-01-14 MED FILL — LANTUS SOLOSTAR 100 UNITS/M: 100 | 28 days supply | Qty: 30 | Fill #1

## 2018-01-21 MED FILL — TRULICITY 1.5 MG/0.5 ML PEN: 1.5 | 84 days supply | Qty: 6 | Fill #1

## 2018-02-05 MED FILL — FENOFIBRIC ACID 135 MG CPDR: 135 | 30 days supply | Qty: 30 | Fill #1

## 2018-02-05 MED FILL — metFORMIN HCL 1000 MG TABS: 1000 | 90 days supply | Qty: 180 | Fill #0

## 2018-02-05 MED FILL — EZETIMIBE 10 MG TABLET: 10 | 90 days supply | Qty: 90 | Fill #0

## 2018-02-05 MED FILL — LEVOTHYROXINE 125 MCG TAB: 125 | 90 days supply | Qty: 90 | Fill #1

## 2018-02-18 MED FILL — LANTUS SOLOSTAR 100 UNITS/M: 100 | 28 days supply | Qty: 30 | Fill #2

## 2018-02-28 ENCOUNTER — Other Ambulatory Visit: Payer: 59

## 2018-02-28 DIAGNOSIS — E1169 Type 2 diabetes mellitus with other specified complication: Secondary | ICD-10-CM

## 2018-02-28 DIAGNOSIS — E782 Mixed hyperlipidemia: Secondary | ICD-10-CM

## 2018-02-28 DIAGNOSIS — E038 Other specified hypothyroidism: Secondary | ICD-10-CM | POA: Diagnosis not present

## 2018-02-28 LAB — BAYER DCA HB A1C WAIVED: HB A1C (BAYER DCA - WAIVED): 7.4 % — ABNORMAL HIGH (ref <7.0)

## 2018-03-01 LAB — CBC WITH DIFFERENTIAL/PLATELET
BASOS: 1 %
Basophils Absolute: 0.1 10*3/uL (ref 0.0–0.2)
EOS (ABSOLUTE): 0.1 10*3/uL (ref 0.0–0.4)
EOS: 2 %
HEMATOCRIT: 38.5 % (ref 34.0–46.6)
Hemoglobin: 12.7 g/dL (ref 11.1–15.9)
Immature Grans (Abs): 0 10*3/uL (ref 0.0–0.1)
Immature Granulocytes: 0 %
LYMPHS ABS: 2.4 10*3/uL (ref 0.7–3.1)
Lymphs: 39 %
MCH: 27.3 pg (ref 26.6–33.0)
MCHC: 33 g/dL (ref 31.5–35.7)
MCV: 83 fL (ref 79–97)
MONOS ABS: 0.4 10*3/uL (ref 0.1–0.9)
Monocytes: 6 %
Neutrophils Absolute: 3.1 10*3/uL (ref 1.4–7.0)
Neutrophils: 52 %
Platelets: 213 10*3/uL (ref 150–450)
RBC: 4.65 x10E6/uL (ref 3.77–5.28)
RDW: 14.4 % (ref 12.3–15.4)
WBC: 6.2 10*3/uL (ref 3.4–10.8)

## 2018-03-01 LAB — CMP14+EGFR
A/G RATIO: 2 (ref 1.2–2.2)
ALK PHOS: 64 IU/L (ref 39–117)
ALT: 35 IU/L — ABNORMAL HIGH (ref 0–32)
AST: 40 IU/L (ref 0–40)
Albumin: 4.4 g/dL (ref 3.6–4.8)
BUN / CREAT RATIO: 17 (ref 12–28)
BUN: 13 mg/dL (ref 8–27)
Bilirubin Total: 0.4 mg/dL (ref 0.0–1.2)
CO2: 22 mmol/L (ref 20–29)
CREATININE: 0.76 mg/dL (ref 0.57–1.00)
Calcium: 9.5 mg/dL (ref 8.7–10.3)
Chloride: 102 mmol/L (ref 96–106)
GFR calc Af Amer: 96 mL/min/{1.73_m2} (ref >59)
GFR calc non Af Amer: 83 mL/min/{1.73_m2} (ref >59)
GLOBULIN, TOTAL: 2.2 g/dL (ref 1.5–4.5)
Glucose: 69 mg/dL (ref 65–99)
POTASSIUM: 3.6 mmol/L (ref 3.5–5.2)
SODIUM: 142 mmol/L (ref 134–144)
Total Protein: 6.6 g/dL (ref 6.0–8.5)

## 2018-03-01 LAB — LIPID PANEL
CHOLESTEROL TOTAL: 167 mg/dL (ref 100–199)
Chol/HDL Ratio: 4.2 ratio (ref 0.0–4.4)
HDL: 40 mg/dL (ref >39)
LDL Calculated: 90 mg/dL (ref 0–99)
TRIGLYCERIDES: 187 mg/dL — AB (ref 0–149)
VLDL CHOLESTEROL CAL: 37 mg/dL (ref 5–40)

## 2018-03-01 LAB — T4 AND TSH
T4 TOTAL: 12.3 ug/dL — AB (ref 4.5–12.0)
TSH: 1.04 u[IU]/mL (ref 0.450–4.500)

## 2018-03-03 ENCOUNTER — Ambulatory Visit (INDEPENDENT_AMBULATORY_CARE_PROVIDER_SITE_OTHER): Payer: 59 | Admitting: Family Medicine

## 2018-03-03 ENCOUNTER — Encounter: Payer: Self-pay | Admitting: Family Medicine

## 2018-03-03 VITALS — BP 126/70 | HR 75 | Temp 97.4°F | Ht 59.0 in | Wt 171.0 lb

## 2018-03-03 DIAGNOSIS — E782 Mixed hyperlipidemia: Secondary | ICD-10-CM

## 2018-03-03 DIAGNOSIS — Z780 Asymptomatic menopausal state: Secondary | ICD-10-CM | POA: Diagnosis not present

## 2018-03-03 DIAGNOSIS — I10 Essential (primary) hypertension: Secondary | ICD-10-CM

## 2018-03-03 DIAGNOSIS — E1169 Type 2 diabetes mellitus with other specified complication: Secondary | ICD-10-CM | POA: Diagnosis not present

## 2018-03-03 DIAGNOSIS — E039 Hypothyroidism, unspecified: Secondary | ICD-10-CM | POA: Diagnosis not present

## 2018-03-03 MED ORDER — METFORMIN HCL 1000 MG PO TABS: ORAL_TABLET | ORAL | 1 refills | Status: DC

## 2018-03-03 MED ORDER — ACCU-CHEK FASTCLIX LANCETS MISC: 3 refills | Status: DC

## 2018-03-03 MED ORDER — OLMESARTAN MEDOXOMIL 20 MG PO TABS: 20.0000 mg | ORAL_TABLET | Freq: Every day | ORAL | 1 refills | Status: DC

## 2018-03-03 MED ORDER — DULAGLUTIDE 1.5 MG/0.5ML ~~LOC~~ SOAJ: SUBCUTANEOUS | 3 refills | Status: DC

## 2018-03-03 MED ORDER — LEVOTHYROXINE SODIUM 125 MCG PO TABS: 125.0000 ug | ORAL_TABLET | Freq: Every day | ORAL | 2 refills | Status: DC

## 2018-03-03 MED ORDER — INSULIN PEN NEEDLE 30G X 8 MM MISC: 1 refills | Status: DC

## 2018-03-03 MED FILL — ACCU-CHEK FASTCLIX LANCETS: 51 days supply | Qty: 102 | Fill #0

## 2018-03-03 MED FILL — UNIFINE PENTIPS 8MM 31G: 31G X 8 MM | 30 days supply | Qty: 100 | Fill #0

## 2018-03-03 MED FILL — OLMESARTAN MEDOXOMIL 20 MG: 20 | 90 days supply | Qty: 90 | Fill #0

## 2018-03-03 NOTE — Patient Instructions (Signed)

## 2018-03-06 ENCOUNTER — Encounter: Payer: Self-pay | Admitting: Family Medicine

## 2018-03-06 NOTE — Progress Notes (Signed)
Subjective:  [name],  female    DOB: 1950/12/06  Age: 67 y.o.    CC: Medical Management of Chronic Issues   HPI Morgan Schmidt presents for  follow-up of hypertension. Patient has no history of headache chest pain or shortness of breath or recent cough. Patient also denies symptoms of TIA such as numbness weakness lateralizing. Patient denies side effects from medication. States taking it regularly.  Patient also  in for follow-up of elevated cholesterol. Doing well without complaints on current medication. Denies side effects  including myalgia and arthralgia and nausea. Also in today for liver function testing. Currently no chest pain, shortness of breath or other cardiovascular related symptoms noted.  Follow-up of diabetes. Patient does check blood sugar at home. Readings run between 109 fasting  and 150-180 postprandial. Patient denies symptoms such as excessive hunger or urinary frequency, excessive hunger, nausea. Says bread is her downfall, but she is exercising more. No significant hypoglycemic spells noted. Medications reviewed. Pt reports taking them regularly. Pt. denies complication/adverse reaction today.   Patient presents for follow-up on  thyroid. The patient has a history of hypothyroidism for many years. It has been stable recently. Pt. denies any change in  voice, loss of hair, heat or cold intolerance. Energy level has been adequate to good. Patient denies constipation and diarrhea. No myxedema. Medication is as noted below. Verified that pt is taking it daily on an empty stomach. Well tolerated.   History Morgan Schmidt has a past medical history of Arthritis, Cancer ([place]), Diabetes mellitus, and Thyroid disease.   She has a past surgical history that includes Thyroidectomy (2009); Cesarean section; Knee arthroscopy (Left, 10/22/2016); Knee arthroscopy with medial menisectomy (10/22/2016); and Knee arthroscopy with subchondroplasty (Left, 10/22/2016).    Her family history is not on file.She reports that she quit smoking about 17 years ago. She has never used smokeless tobacco. She reports that she does not drink alcohol or use drugs.  Current Outpatient Medications on File Prior to Visit  Medication Sig Dispense Refill  . Cholecalciferol (VITAMIN D) 2000 UNITS tablet Take 1 tablet (2,000 Units total) by mouth daily. (Patient taking differently: Take 2,000 Units by mouth every evening. ) 30 tablet 11  . Choline Fenofibrate (FENOFIBRIC ACID) 135 MG CPDR Take 1 capsule by mouth daily. 30 capsule 5  . diclofenac sodium (VOLTAREN) 1 % GEL Apply topically 4 (four) times daily.    Marland Kitchen ezetimibe (ZETIA) 10 MG tablet TAKE 1 TABLET BY MOUTH ONCE DAILY FOR CHOLESTEROL 90 tablet 3  . glucose blood (ACCU-CHEK GUIDE) test strip Use as instructed 100 each 3  . Insulin Glargine (LANTUS SOLOSTAR) 100 UNIT/ML Solostar Pen Inject 110 Units into the skin every morning. 11 pen PRN   No current facility-administered medications on file prior to visit.     ROS Review of Systems  Constitutional: Negative.   HENT: Negative for congestion.   Eyes: Negative for visual disturbance.  Respiratory: Negative for shortness of breath.   Cardiovascular: Negative for chest pain.  Gastrointestinal: Negative for abdominal pain, constipation, diarrhea, nausea and vomiting.  Genitourinary: Negative for difficulty urinating.  Musculoskeletal: Negative for arthralgias and myalgias.  Neurological: Negative for headaches.  Psychiatric/Behavioral: Negative for sleep disturbance.    Objective:  BP 126/70   Pulse 75   Temp (!) 97.4 F (36.3 C) (Oral)   Ht 4\' 11"  (1.499 m)   Wt 171 lb (77.6 kg)   LMP 03/02/2004   BMI 34.54 kg/m  BP Readings from Last 3 Encounters:  03/03/18 126/70  11/29/17 (!) 100/58  08/26/17 120/61    Wt Readings from Last 3 Encounters:  03/03/18 171 lb (77.6 kg)  11/29/17 169 lb (76.7 kg)  08/26/17 166 lb (75.3 kg)     Physical Exam   Constitutional: She is oriented to person, place, and time. She appears well-developed and well-nourished. No distress.  HENT:  Head: Normocephalic and atraumatic.  Right Ear: External ear normal.  Left Ear: External ear normal.  Nose: Nose normal.  Mouth/Throat: Oropharynx is clear and moist.  Eyes: Pupils are equal, round, and reactive to light. Conjunctivae and EOM are normal.  Neck: Normal range of motion. Neck supple. No thyromegaly present.  Cardiovascular: Normal rate, regular rhythm and normal heart sounds.  No murmur heard. Pulmonary/Chest: Effort normal and breath sounds normal. No respiratory distress. She has no wheezes. She has no rales.  Abdominal: Soft. Bowel sounds are normal. She exhibits no distension. There is no tenderness.  Lymphadenopathy:    She has no cervical adenopathy.  Neurological: She is alert and oriented to person, place, and time. She has normal reflexes.  Skin: Skin is warm and dry.  Psychiatric: She has a normal mood and affect. Her behavior is normal. Judgment and thought content normal.    Diabetic Foot Exam - Simple   Simple Foot Form Diabetic Foot exam was performed with the following findings:  Yes 03/03/2018  8:30 AM  Visual Inspection No deformities, no ulcerations, no other skin breakdown bilaterally:  Yes Sensation Testing Intact to touch and monofilament testing bilaterally:  Yes Pulse Check Posterior Tibialis and Dorsalis pulse intact bilaterally:  Yes Comments       Assessment & Plan:   Morgan Schmidt was seen today for medical management of chronic issues.  Diagnoses and all orders for this visit:  Mixed diabetic hyperlipidemia associated with type 2 diabetes mellitus ([place])  Hypothyroidism, unspecified type -     levothyroxine (SYNTHROID, LEVOTHROID) 125 MCG tablet; Take 1 tablet (125 mcg total) by mouth daily.  Postmenopausal -     DG [facility] DEXA  Essential hypertension  Other orders -     ACCU-CHEK FASTCLIX LANCETS MISC; Use  to check Blood Sugars twice daily. -     Dulaglutide (TRULICITY) 1.5 AV/4.0JW SOPN; INJECT 1 PEN INTO THE SKIN WEEKLY -     Insulin Pen Needle (NOVOFINE) 30G X 8 MM MISC; use as directed -     metFORMIN (GLUCOPHAGE) 1000 MG tablet; TAKE 1 TABLET BY MOUTH 2 TIMES DAILY WITH A MEAL. -     olmesartan (BENICAR) 20 MG tablet; Take 1 tablet (20 mg total) by mouth daily.   I have discontinued Aly Seidenberg. Levels's LANTUS SOLOSTAR and NOVOFINE. I am also having her maintain her Vitamin D, diclofenac sodium, Insulin Glargine, Fenofibric Acid, ezetimibe, glucose blood, levothyroxine, ACCU-CHEK FASTCLIX LANCETS, Dulaglutide, Insulin Pen Needle, metFORMIN, and olmesartan.  Meds ordered this encounter  Medications  . levothyroxine (SYNTHROID, LEVOTHROID) 125 MCG tablet    Sig: Take 1 tablet (125 mcg total) by mouth daily.    Dispense:  90 tablet    Refill:  2  . ACCU-CHEK FASTCLIX LANCETS MISC    Sig: Use to check Blood Sugars twice daily.    Dispense:  102 each    Refill:  3  . Dulaglutide (TRULICITY) 1.5 JX/9.1YN SOPN    Sig: INJECT 1 PEN INTO THE SKIN WEEKLY    Dispense:  12 mL    Refill:  3  .  Insulin Pen Needle (NOVOFINE) 30G X 8 MM MISC    Sig: use as directed    Dispense:  100 each    Refill:  1    E11.9  . metFORMIN (GLUCOPHAGE) 1000 MG tablet    Sig: TAKE 1 TABLET BY MOUTH 2 TIMES DAILY WITH A MEAL.    Dispense:  180 tablet    Refill:  1  . olmesartan (BENICAR) 20 MG tablet    Sig: Take 1 tablet (20 mg total) by mouth daily.    Dispense:  90 tablet    Refill:  1     Follow-up: No follow-ups on file.  Claretta Fraise, M.D.

## 2018-03-07 ENCOUNTER — Other Ambulatory Visit: Payer: 59

## 2018-03-08 ENCOUNTER — Inpatient Hospital Stay (HOSPITAL_COMMUNITY)
Admission: EM | Admit: 2018-03-08 | Discharge: 2018-03-12 | DRG: 481 | Disposition: A | Discharge status: Home Health | Payer: 59 | Attending: Internal Medicine | Admitting: Internal Medicine

## 2018-03-08 DIAGNOSIS — E1165 Type 2 diabetes mellitus with hyperglycemia: Secondary | ICD-10-CM | POA: Diagnosis not present

## 2018-03-08 DIAGNOSIS — Z888 Allergy status to other drugs, medicaments and biological substances status: Secondary | ICD-10-CM

## 2018-03-08 DIAGNOSIS — E871 Hypo-osmolality and hyponatremia: Secondary | ICD-10-CM | POA: Diagnosis not present

## 2018-03-08 DIAGNOSIS — M1990 Unspecified osteoarthritis, unspecified site: Secondary | ICD-10-CM | POA: Diagnosis present

## 2018-03-08 DIAGNOSIS — S72002A Fracture of unspecified part of neck of left femur, initial encounter for closed fracture: Secondary | ICD-10-CM | POA: Diagnosis not present

## 2018-03-08 DIAGNOSIS — Z87891 Personal history of nicotine dependence: Secondary | ICD-10-CM

## 2018-03-08 DIAGNOSIS — E785 Hyperlipidemia, unspecified: Secondary | ICD-10-CM | POA: Diagnosis not present

## 2018-03-08 DIAGNOSIS — E038 Other specified hypothyroidism: Secondary | ICD-10-CM | POA: Diagnosis not present

## 2018-03-08 DIAGNOSIS — E1169 Type 2 diabetes mellitus with other specified complication: Secondary | ICD-10-CM | POA: Diagnosis not present

## 2018-03-08 DIAGNOSIS — Z794 Long term (current) use of insulin: Secondary | ICD-10-CM

## 2018-03-08 DIAGNOSIS — D649 Anemia, unspecified: Secondary | ICD-10-CM | POA: Diagnosis present

## 2018-03-08 DIAGNOSIS — W010XXA Fall on same level from slipping, tripping and stumbling without subsequent striking against object, initial encounter: Secondary | ICD-10-CM | POA: Diagnosis present

## 2018-03-08 DIAGNOSIS — E669 Obesity, unspecified: Secondary | ICD-10-CM | POA: Diagnosis not present

## 2018-03-08 DIAGNOSIS — E782 Mixed hyperlipidemia: Secondary | ICD-10-CM

## 2018-03-08 DIAGNOSIS — Z79890 Hormone replacement therapy (postmenopausal): Secondary | ICD-10-CM

## 2018-03-08 DIAGNOSIS — Z6832 Body mass index (BMI) 32.0-32.9, adult: Secondary | ICD-10-CM | POA: Diagnosis not present

## 2018-03-08 DIAGNOSIS — S99912A Unspecified injury of left ankle, initial encounter: Secondary | ICD-10-CM | POA: Diagnosis not present

## 2018-03-08 DIAGNOSIS — M25572 Pain in left ankle and joints of left foot: Secondary | ICD-10-CM | POA: Diagnosis not present

## 2018-03-08 DIAGNOSIS — I10 Essential (primary) hypertension: Secondary | ICD-10-CM | POA: Diagnosis not present

## 2018-03-08 DIAGNOSIS — Z79899 Other long term (current) drug therapy: Secondary | ICD-10-CM

## 2018-03-08 DIAGNOSIS — S72042A Displaced fracture of base of neck of left femur, initial encounter for closed fracture: Secondary | ICD-10-CM | POA: Diagnosis not present

## 2018-03-08 DIAGNOSIS — S8992XA Unspecified injury of left lower leg, initial encounter: Secondary | ICD-10-CM | POA: Diagnosis not present

## 2018-03-08 DIAGNOSIS — Z419 Encounter for procedure for purposes other than remedying health state, unspecified: Secondary | ICD-10-CM

## 2018-03-08 DIAGNOSIS — S72012A Unspecified intracapsular fracture of left femur, initial encounter for closed fracture: Secondary | ICD-10-CM | POA: Diagnosis not present

## 2018-03-08 DIAGNOSIS — S93402A Sprain of unspecified ligament of left ankle, initial encounter: Secondary | ICD-10-CM | POA: Diagnosis present

## 2018-03-08 HISTORY — DX: Fracture of unspecified part of neck of unspecified femur, initial encounter for closed fracture: S72.009A

## 2018-03-09 ENCOUNTER — Emergency Department (HOSPITAL_COMMUNITY): Payer: 59

## 2018-03-09 ENCOUNTER — Encounter (HOSPITAL_COMMUNITY): Payer: Self-pay | Admitting: *Deleted

## 2018-03-09 ENCOUNTER — Other Ambulatory Visit: Payer: Self-pay

## 2018-03-09 DIAGNOSIS — M25572 Pain in left ankle and joints of left foot: Secondary | ICD-10-CM | POA: Diagnosis not present

## 2018-03-09 DIAGNOSIS — Z6832 Body mass index (BMI) 32.0-32.9, adult: Secondary | ICD-10-CM | POA: Diagnosis not present

## 2018-03-09 DIAGNOSIS — Z794 Long term (current) use of insulin: Secondary | ICD-10-CM | POA: Diagnosis not present

## 2018-03-09 DIAGNOSIS — I10 Essential (primary) hypertension: Secondary | ICD-10-CM | POA: Diagnosis not present

## 2018-03-09 DIAGNOSIS — E1169 Type 2 diabetes mellitus with other specified complication: Secondary | ICD-10-CM | POA: Diagnosis not present

## 2018-03-09 DIAGNOSIS — S72042A Displaced fracture of base of neck of left femur, initial encounter for closed fracture: Secondary | ICD-10-CM | POA: Diagnosis not present

## 2018-03-09 DIAGNOSIS — D649 Anemia, unspecified: Secondary | ICD-10-CM | POA: Diagnosis present

## 2018-03-09 DIAGNOSIS — W010XXA Fall on same level from slipping, tripping and stumbling without subsequent striking against object, initial encounter: Secondary | ICD-10-CM | POA: Diagnosis present

## 2018-03-09 DIAGNOSIS — E871 Hypo-osmolality and hyponatremia: Secondary | ICD-10-CM | POA: Diagnosis not present

## 2018-03-09 DIAGNOSIS — E038 Other specified hypothyroidism: Secondary | ICD-10-CM | POA: Diagnosis not present

## 2018-03-09 DIAGNOSIS — S72002A Fracture of unspecified part of neck of left femur, initial encounter for closed fracture: Secondary | ICD-10-CM | POA: Diagnosis not present

## 2018-03-09 DIAGNOSIS — Z01818 Encounter for other preprocedural examination: Secondary | ICD-10-CM | POA: Diagnosis not present

## 2018-03-09 DIAGNOSIS — S72012A Unspecified intracapsular fracture of left femur, initial encounter for closed fracture: Secondary | ICD-10-CM | POA: Diagnosis not present

## 2018-03-09 DIAGNOSIS — Z79890 Hormone replacement therapy (postmenopausal): Secondary | ICD-10-CM | POA: Diagnosis not present

## 2018-03-09 DIAGNOSIS — S93402A Sprain of unspecified ligament of left ankle, initial encounter: Secondary | ICD-10-CM | POA: Diagnosis not present

## 2018-03-09 DIAGNOSIS — Z79899 Other long term (current) drug therapy: Secondary | ICD-10-CM | POA: Diagnosis not present

## 2018-03-09 DIAGNOSIS — Z87891 Personal history of nicotine dependence: Secondary | ICD-10-CM | POA: Diagnosis not present

## 2018-03-09 DIAGNOSIS — S8992XA Unspecified injury of left lower leg, initial encounter: Secondary | ICD-10-CM | POA: Diagnosis not present

## 2018-03-09 DIAGNOSIS — E119 Type 2 diabetes mellitus without complications: Secondary | ICD-10-CM | POA: Diagnosis not present

## 2018-03-09 DIAGNOSIS — S99912A Unspecified injury of left ankle, initial encounter: Secondary | ICD-10-CM | POA: Diagnosis not present

## 2018-03-09 DIAGNOSIS — M1990 Unspecified osteoarthritis, unspecified site: Secondary | ICD-10-CM | POA: Diagnosis not present

## 2018-03-09 DIAGNOSIS — W19XXXA Unspecified fall, initial encounter: Secondary | ICD-10-CM | POA: Diagnosis not present

## 2018-03-09 DIAGNOSIS — Z888 Allergy status to other drugs, medicaments and biological substances status: Secondary | ICD-10-CM | POA: Diagnosis not present

## 2018-03-09 DIAGNOSIS — S72042D Displaced fracture of base of neck of left femur, subsequent encounter for closed fracture with routine healing: Secondary | ICD-10-CM | POA: Diagnosis not present

## 2018-03-09 DIAGNOSIS — E1165 Type 2 diabetes mellitus with hyperglycemia: Secondary | ICD-10-CM | POA: Diagnosis not present

## 2018-03-09 DIAGNOSIS — E782 Mixed hyperlipidemia: Secondary | ICD-10-CM | POA: Diagnosis not present

## 2018-03-09 DIAGNOSIS — E785 Hyperlipidemia, unspecified: Secondary | ICD-10-CM | POA: Diagnosis not present

## 2018-03-09 DIAGNOSIS — E669 Obesity, unspecified: Secondary | ICD-10-CM | POA: Diagnosis not present

## 2018-03-09 HISTORY — DX: Fracture of unspecified part of neck of left femur, initial encounter for closed fracture: S72.002A

## 2018-03-09 LAB — URINALYSIS, ROUTINE W REFLEX MICROSCOPIC
Bilirubin Urine: NEGATIVE
Glucose, UA: >=500 mg/dL — AB
Hgb urine dipstick: NEGATIVE
Ketones, ur: NEGATIVE mg/dL
NITRITE: NEGATIVE
Protein, ur: NEGATIVE mg/dL
Specific Gravity, Urine: 1.015 (ref 1.005–1.030)
pH: 5 (ref 5.0–8.0)

## 2018-03-09 LAB — BASIC METABOLIC PANEL
Anion gap: 11 (ref 5–15)
BUN: 11 mg/dL (ref 8–23)
CO2: 23 mmol/L (ref 22–32)
Calcium: 8.5 mg/dL — ABNORMAL LOW (ref 8.9–10.3)
Chloride: 100 mmol/L (ref 98–111)
Creatinine, Ser: 0.69 mg/dL (ref 0.44–1.00)
GFR calc Af Amer: >60 mL/min (ref >60)
GFR calc non Af Amer: >60 mL/min (ref >60)
GLUCOSE: 265 mg/dL — AB (ref 70–99)
Potassium: 3.7 mmol/L (ref 3.5–5.1)
Sodium: 134 mmol/L — ABNORMAL LOW (ref 135–145)

## 2018-03-09 LAB — CBG MONITORING, ED: Glucose-Capillary: 253 mg/dL — ABNORMAL HIGH (ref 70–99)

## 2018-03-09 LAB — CBC
HEMATOCRIT: 38.3 % (ref 36.0–46.0)
HEMOGLOBIN: 12.4 g/dL (ref 12.0–15.0)
MCH: 27.3 pg (ref 26.0–34.0)
MCHC: 32.4 g/dL (ref 30.0–36.0)
MCV: 84.4 fL (ref 78.0–100.0)
Platelets: 212 10*3/uL (ref 150–400)
RBC: 4.54 MIL/uL (ref 3.87–5.11)
RDW: 13.4 % (ref 11.5–15.5)
WBC: 7.6 10*3/uL (ref 4.0–10.5)

## 2018-03-09 LAB — GLUCOSE, CAPILLARY
GLUCOSE-CAPILLARY: 210 mg/dL — AB (ref 70–99)
Glucose-Capillary: 239 mg/dL — ABNORMAL HIGH (ref 70–99)

## 2018-03-09 LAB — TYPE AND SCREEN
ABO/RH(D): O POS
Antibody Screen: NEGATIVE

## 2018-03-09 LAB — PROTIME-INR
INR: 1.02
Prothrombin Time: 13.3 seconds (ref 11.4–15.2)

## 2018-03-09 LAB — HIV ANTIBODY (ROUTINE TESTING W REFLEX): HIV Screen 4th Generation wRfx: NONREACTIVE

## 2018-03-09 LAB — ABO/RH: ABO/RH(D): O POS

## 2018-03-09 MED ORDER — MORPHINE SULFATE (PF) 4 MG/ML IV SOLN
4.0000 mg | Freq: Once | INTRAVENOUS | Status: AC
  Administered 2018-03-09: 4 mg via INTRAVENOUS
  Filled 2018-03-09: qty 1

## 2018-03-09 MED ORDER — HEPARIN SODIUM (PORCINE) 5000 UNIT/ML IJ SOLN
5000.0000 [IU] | Freq: Three times a day (TID) | INTRAMUSCULAR | Status: AC
  Administered 2018-03-09 (×2): 5000 [IU] via SUBCUTANEOUS
  Filled 2018-03-09 (×2): qty 1

## 2018-03-09 MED ORDER — DEXTROSE 5 % IV SOLN
500.0000 mg | Freq: Four times a day (QID) | INTRAVENOUS | Status: DC | PRN
  Filled 2018-03-09: qty 5

## 2018-03-09 MED ORDER — LEVOTHYROXINE SODIUM 25 MCG PO TABS
125.0000 ug | ORAL_TABLET | Freq: Every day | ORAL | Status: DC
  Administered 2018-03-09 – 2018-03-12 (×3): 125 ug via ORAL
  Filled 2018-03-09 (×4): qty 1

## 2018-03-09 MED ORDER — ONDANSETRON HCL 4 MG/2ML IJ SOLN
4.0000 mg | Freq: Once | INTRAMUSCULAR | Status: AC
  Administered 2018-03-09: 4 mg via INTRAVENOUS
  Filled 2018-03-09: qty 2

## 2018-03-09 MED ORDER — INSULIN ASPART 100 UNIT/ML ~~LOC~~ SOLN: 0.0000 [IU] | SUBCUTANEOUS | Status: DC

## 2018-03-09 MED ORDER — INSULIN ASPART 100 UNIT/ML ~~LOC~~ SOLN
0.0000 [IU] | Freq: Every day | SUBCUTANEOUS | Status: DC
  Administered 2018-03-09: 2 [IU] via SUBCUTANEOUS

## 2018-03-09 MED ORDER — IRBESARTAN 150 MG PO TABS: 150.0000 mg | ORAL_TABLET | Freq: Every day | ORAL | Status: DC

## 2018-03-09 MED ORDER — DOCUSATE SODIUM 100 MG PO CAPS
100.0000 mg | ORAL_CAPSULE | Freq: Two times a day (BID) | ORAL | Status: DC
  Administered 2018-03-09 (×2): 100 mg via ORAL
  Filled 2018-03-09 (×2): qty 1

## 2018-03-09 MED ORDER — METHOCARBAMOL 500 MG PO TABS
500.0000 mg | ORAL_TABLET | Freq: Four times a day (QID) | ORAL | Status: DC | PRN
  Administered 2018-03-10 (×3): 500 mg via ORAL
  Filled 2018-03-09 (×3): qty 1

## 2018-03-09 MED ORDER — OXYCODONE-ACETAMINOPHEN 5-325 MG PO TABS
1.0000 | ORAL_TABLET | Freq: Once | ORAL | Status: AC
  Administered 2018-03-09: 1 via ORAL
  Filled 2018-03-09: qty 1

## 2018-03-09 MED ORDER — FENTANYL CITRATE (PF) 100 MCG/2ML IJ SOLN: 25.0000 ug | INTRAMUSCULAR | Status: DC | PRN

## 2018-03-09 MED ORDER — ZOLPIDEM TARTRATE 5 MG PO TABS: 5.0000 mg | ORAL_TABLET | Freq: Every evening | ORAL | Status: DC | PRN

## 2018-03-09 MED ORDER — INSULIN GLARGINE 100 UNIT/ML ~~LOC~~ SOLN
10.0000 [IU] | Freq: Every day | SUBCUTANEOUS | Status: DC
  Administered 2018-03-09: 10 [IU] via SUBCUTANEOUS
  Filled 2018-03-09: qty 0.1

## 2018-03-09 MED ORDER — INSULIN ASPART 100 UNIT/ML ~~LOC~~ SOLN
0.0000 [IU] | Freq: Three times a day (TID) | SUBCUTANEOUS | Status: DC
  Administered 2018-03-09: 5 [IU] via SUBCUTANEOUS
  Administered 2018-03-09 (×2): 3 [IU] via SUBCUTANEOUS
  Administered 2018-03-10: 2 [IU] via SUBCUTANEOUS

## 2018-03-09 MED ORDER — MORPHINE SULFATE (PF) 4 MG/ML IV SOLN
0.5000 mg | INTRAVENOUS | Status: DC | PRN
  Administered 2018-03-09 – 2018-03-10 (×5): 0.52 mg via INTRAVENOUS
  Filled 2018-03-09 (×4): qty 1

## 2018-03-09 MED ORDER — SODIUM CHLORIDE 0.9 % IV SOLN
INTRAVENOUS | Status: AC
  Administered 2018-03-09: 09:00:00 via INTRAVENOUS

## 2018-03-09 MED ORDER — HYDROCODONE-ACETAMINOPHEN 5-325 MG PO TABS
1.0000 | ORAL_TABLET | Freq: Four times a day (QID) | ORAL | Status: DC | PRN
  Administered 2018-03-10: 1 via ORAL
  Administered 2018-03-10: 2 via ORAL
  Filled 2018-03-09: qty 1
  Filled 2018-03-09: qty 2

## 2018-03-09 NOTE — Consult Note (Signed)
Reason for Consult:  L hip pain Referring Physician:  Dr. Mammie Russian Morgan Schmidt is an 67 y.o. female.  HPI:  67 y/o female with PMH of diabetes fell yesterday at her grandson's wedding onto her left side.  She denies LOC.  She denies recent illnesses.  Her diabetes is generally well controlled.  She believes she fell due to walking on uneven ground and losing her balance.  She c/o mild soreness in the left ankle and moderate pain in the left hip.  No h/o previous hip surgery or injury.  She had her left knee scoped by Dr. Alvan Dame last year.  She feels better when lying still in bed.  Past Medical History:  Diagnosis Date  . Arthritis   . Cancer ([place])    thyroid CA  . Diabetes mellitus    type 2  . Hip fracture ([place])   . Thyroid disease     Past Surgical History:  Procedure Laterality Date  . CESAREAN SECTION    . KNEE ARTHROSCOPY Left 10/22/2016   Procedure: ARTHROSCOPY LEFT KNEE WITH DEBRIDEMENT;  Surgeon: Paralee Cancel, MD;  Location: [facility];  Service: Orthopedics;  Laterality: Left;  . KNEE ARTHROSCOPY WITH MEDIAL MENISECTOMY  10/22/2016   Procedure: KNEE ARTHROSCOPY WITH MEDIAL MENISECTOMY;  Surgeon: Paralee Cancel, MD;  Location: [facility];  Service: Orthopedics;;  . KNEE ARTHROSCOPY WITH SUBCHONDROPLASTY Left 10/22/2016   Procedure: KNEE ARTHROSCOPY WITH MEDIAL CHONDROPLASTY;  Surgeon: Paralee Cancel, MD;  Location: [facility];  Service: Orthopedics;  Laterality: Left;  . THYROIDECTOMY  2009    Family History  Problem Relation Age of Onset  . Colon cancer Neg Hx   . Esophageal cancer Neg Hx   . Rectal cancer Neg Hx   . Stomach cancer Neg Hx   . Breast cancer Neg Hx     Social History:  reports that she quit smoking about 17 years ago. She has never used smokeless tobacco. She reports that she does not drink alcohol or use drugs.  Allergies:  Allergies  Allergen Reactions  . Ace Inhibitors Cough    Medications:  reviewed  Results for orders placed or performed during the hospital encounter of 03/08/18 (from the past 48 hour(s))  POC CBG, ED     Status: Abnormal   Collection Time: 03/09/18  3:13 AM  Result Value Ref Range   Glucose-Capillary 253 (H) 70 - 99 mg/dL  CBC     Status: None   Collection Time: 03/09/18  5:12 AM  Result Value Ref Range   WBC 7.6 4.0 - 10.5 K/uL   RBC 4.54 3.87 - 5.11 MIL/uL   Hemoglobin 12.4 12.0 - 15.0 g/dL   HCT 38.3 36.0 - 46.0 %   MCV 84.4 78.0 - 100.0 fL   MCH 27.3 26.0 - 34.0 pg   MCHC 32.4 30.0 - 36.0 g/dL   RDW 13.4 11.5 - 15.5 %   Platelets 212 150 - 400 K/uL    Comment: Performed at [facility], [place] [address], [place], [place] [address]  Basic metabolic panel     Status: Abnormal   Collection Time: 03/09/18  5:12 AM  Result Value Ref Range   Sodium 134 (L) 135 - 145 mmol/L   Potassium 3.7 3.5 - 5.1 mmol/L   Chloride 100 98 - 111 mmol/L    Comment: Please note change in reference range.   CO2 23 22 - 32 mmol/L   Glucose, Bld 265 (  H) 70 - 99 mg/dL    Comment: Please note change in reference range.   BUN 11 8 - 23 mg/dL    Comment: Please note change in reference range.   Creatinine, Ser 0.69 0.44 - 1.00 mg/dL   Calcium 8.5 (L) 8.9 - 10.3 mg/dL   GFR calc non Af Amer >60 >60 mL/min   GFR calc Af Amer >60 >60 mL/min    Comment: (NOTE) The eGFR has been calculated using the CKD EPI equation. This calculation has not been validated in all clinical situations. eGFR's persistently <60 mL/min signify possible Chronic Kidney Disease.    Anion gap 11 5 - 15    Comment: Performed at [facility] [address], [place], [place] [address]  Protime-INR     Status: None   Collection Time: 03/09/18  5:12 AM  Result Value Ref Range   Prothrombin Time 13.3 11.4 - 15.2 seconds   INR 1.02     Comment: Performed at [facility] [address], [place], [place] [address]  Type and screen [place]     Status: None    Collection Time: 03/09/18  6:05 AM  Result Value Ref Range   ABO/RH(D) O POS    Antibody Screen NEG    Sample Expiration      03/12/2018 Performed at [facility], [place] [address], [place], [place] [address]   ABO/Rh     Status: None (Preliminary result)   Collection Time: 03/09/18  6:05 AM  Result Value Ref Range   ABO/RH(D)      O POS Performed at [facility] [address] [place], [place] [address]     Dg Ankle Complete Left  Result Date: 03/09/2018 CLINICAL DATA:  Initial evaluation for acute trauma, fall. EXAM: LEFT ANKLE COMPLETE - 3+ VIEW COMPARISON:  None. FINDINGS: No acute fracture or dislocation. Ankle mortise approximated. Talar dome intact. No acute soft tissue abnormality. Posterior and plantar calcaneal enthesophytes noted. IMPRESSION: No acute osseous abnormality about the left ankle. Electronically Signed   By: Jeannine Boga M.D.   On: 03/09/2018 04:33   Dg Chest Port 1 View  Result Date: 03/09/2018 CLINICAL DATA:  Preoperative evaluation for left hip fracture. EXAM: PORTABLE CHEST 1 VIEW COMPARISON:  Prior radiograph from 04/19/2010 FINDINGS: Mild cardiomegaly, stable. Mediastinal silhouette normal. Mild aortic atherosclerosis. Lungs are hypoinflated. No focal infiltrates. No pulmonary edema or pleural effusion. No pneumothorax. No acute osseous abnormality. IMPRESSION: 1. No active cardiopulmonary disease. 2. Mild cardiomegaly, stable. Electronically Signed   By: Jeannine Boga M.D.   On: 03/09/2018 07:21   Dg Knee Complete 4 Views Left  Result Date: 03/09/2018 CLINICAL DATA:  Initial evaluation for acute fall, pain. EXAM: LEFT KNEE - COMPLETE 4+ VIEW COMPARISON:  Prior radiograph from 09/22/2016 FINDINGS: No acute fracture or dislocation. No joint effusion. Enthesophyte at the superior pole the patella. Mild degenerative osteoarthritic changes about the knee. Osseous mineralization normal. No soft tissue abnormality. IMPRESSION: No acute  osseous abnormality about the knee. Electronically Signed   By: Jeannine Boga M.D.   On: 03/09/2018 04:34   Dg Hip Unilat W Or Wo Pelvis 2-3 Views Left  Result Date: 03/09/2018 CLINICAL DATA:  Initial evaluation for acute trauma, fall. EXAM: DG HIP (WITH OR WITHOUT PELVIS) 2-3V LEFT COMPARISON:  None. FINDINGS: Acute subcapital fracture of the left femoral neck with slight impaction. Femoral head remains in normal alignment within the acetabulum. Bony pelvis intact.  SI joints approximated. No acute abnormality about the right hip. Soft tissues within normal limits. IMPRESSION: Acute subcapital fracture of the left femoral neck with mild impaction. Electronically Signed   By: Jeannine Boga M.D.   On: 03/09/2018 04:32    ROS:  No recent f/c/n/v/wt loss PE:  Blood pressure 136/66, pulse 66, temperature (!) 97 F (36.1 C), resp. rate 18, height 4' 11" (1.499 m), weight 70.3 kg (155 lb), last menstrual period 03/02/2004, SpO2 97 %. wn wd female in nad.  A and Ox 4.  Mood and affect normal.  EOMI.  resp unlabored.  L LE with pain on IR and ER.  Skin healty and intact.  No lymphadenopathy.  5/5 strength in PF and DF fo the ankel.  No ankle swelling.  Slightly TTP at the ATFL.  Pulses palpable in the elft foot.  Sens to LT intact aboutt he foot.  5/5 strength in PF and DF of the ankle and toes.    Assessment/Plan: L ankle sprain and L hip femoral neck fracture -  I explained the nature of these injuries tot he patient in detail.  Her hip will require operative treatment to be determined by Dr. Alvan Dame.  She can bear weight on the ankle when she gets her hip fixed.  I believe the ankel sprain will heal without difficulty.  She is unlikely to have surgery today.  She can eat, but we'll hold blood thinners in preparation for possible surgery tomorrow.  Wylene Simmer 03/09/2018, 10:05 AM

## 2018-03-09 NOTE — ED Triage Notes (Signed)
The pt fell earlier tonight and onto her lt knee  She has pain in her lt knee lt hip   Soreness  Surgery on her lt knee one year ago

## 2018-03-09 NOTE — H&P (Signed)
History and Physical    Morgan Schmidt NIO:[id number] DOB: 1951/01/09 DOA: 03/08/2018  PCP: Claretta Fraise, MD Patient coming from: home  Chief Complaint: fall  HPI: Morgan Schmidt is a 67 y.o. female with medical history significant for diabetes, thyroid disease, arthritis,hypertension since emergency department with chief complaint of left hip pain after a mechanical fall. Initial evaluation reveals closed left hip fracture. [facility] are asked to admit  Information is obtained from the patient.she was at her grandsons wedding yesterday. She states they were "lining up" and her left ankle gave way and then her knee gave way and she fell to the ground. She immediately felt pain in her left hip. She went home continued to have pain in that left hip. She denies her head or losing consciousness during the fall. She denies any other pain. She states bearing weight makes the pain worse. She denies headache dizziness syncope or near-syncope. She denies chest pain palpitations shortness of breath. She denies abdominal pain nausea vomiting diarrhea constipation melena bright red blood per rectum. She denies any lower extremity edema or orthopnea. She denies any numbness or tingling of her lower extremities   ED Course: emergency department she's afebrile hemodynamically stable and not hypoxic  Review of Systems: As per HPI otherwise all other systems reviewed and are negative.   Ambulatory Status: ambulates independently is independent with ADLs  Past Medical History:  Diagnosis Date  . Arthritis   . Cancer ([place])    thyroid CA  . Diabetes mellitus    type 2  . Hip fracture ([place])   . Thyroid disease     Past Surgical History:  Procedure Laterality Date  . CESAREAN SECTION    . KNEE ARTHROSCOPY Left 10/22/2016   Procedure: ARTHROSCOPY LEFT KNEE WITH DEBRIDEMENT;  Surgeon: Paralee Cancel, MD;  Location: [facility];  Service: Orthopedics;  Laterality: Left;  . KNEE  ARTHROSCOPY WITH MEDIAL MENISECTOMY  10/22/2016   Procedure: KNEE ARTHROSCOPY WITH MEDIAL MENISECTOMY;  Surgeon: Paralee Cancel, MD;  Location: [facility];  Service: Orthopedics;;  . KNEE ARTHROSCOPY WITH SUBCHONDROPLASTY Left 10/22/2016   Procedure: KNEE ARTHROSCOPY WITH MEDIAL CHONDROPLASTY;  Surgeon: Paralee Cancel, MD;  Location: [facility];  Service: Orthopedics;  Laterality: Left;  . THYROIDECTOMY  2009    Social History   Socioeconomic History  . Marital status: Divorced    Spouse name: Not on file  . Number of children: Not on file  . Years of education: Not on file  . Highest education level: Not on file  Occupational History  . Not on file  Social Needs  . Financial resource strain: Not on file  . Food insecurity:    Worry: Not on file    Inability: Not on file  . Transportation needs:    Medical: Not on file    Non-medical: Not on file  Tobacco Use  . Smoking status: Former Smoker    Last attempt to quit: 08/23/2000    Years since quitting: 17.5  . Smokeless tobacco: Never Used  Substance and Sexual Activity  . Alcohol use: No  . Drug use: No  . Sexual activity: Not on file  Lifestyle  . Physical activity:    Days per week: Not on file    Minutes per session: Not on file  . Stress: Not on file  Relationships  . Social connections:    Talks on phone: Not on file    Gets together: Not on file  Attends religious service: Not on file    Active member of club or organization: Not on file    Attends meetings of clubs or organizations: Not on file    Relationship status: Not on file  . Intimate partner violence:    Fear of current or ex partner: Not on file    Emotionally abused: Not on file    Physically abused: Not on file    Forced sexual activity: Not on file  Other Topics Concern  . Not on file  Social History Narrative  . Not on file    Allergies  Allergen Reactions  . Ace Inhibitors Cough    Family History  Problem  Relation Age of Onset  . Colon cancer Neg Hx   . Esophageal cancer Neg Hx   . Rectal cancer Neg Hx   . Stomach cancer Neg Hx   . Breast cancer Neg Hx     Prior to Admission medications   Medication Sig Start Date End Date Taking? Authorizing Provider  ACCU-CHEK FASTCLIX LANCETS MISC Use to check Blood Sugars twice daily. 03/03/18   Claretta Fraise, MD  Cholecalciferol (VITAMIN D) 2000 UNITS tablet Take 1 tablet (2,000 Units total) by mouth daily. Patient taking differently: Take 2,000 Units by mouth every evening.  03/18/15   Claretta Fraise, MD  Choline Fenofibrate (FENOFIBRIC ACID) 135 MG CPDR Take 1 capsule by mouth daily. 11/29/17   Claretta Fraise, MD  diclofenac sodium (VOLTAREN) 1 % GEL Apply topically 4 (four) times daily.    [provider]  Dulaglutide (TRULICITY) 1.5 MW/1.0UV SOPN INJECT 1 PEN INTO THE SKIN WEEKLY 03/03/18   Claretta Fraise, MD  ezetimibe (ZETIA) 10 MG tablet TAKE 1 TABLET BY MOUTH ONCE DAILY FOR CHOLESTEROL 11/29/17   Claretta Fraise, MD  glucose blood (ACCU-CHEK GUIDE) test strip Use as instructed 11/29/17   Claretta Fraise, MD  Insulin Glargine (LANTUS SOLOSTAR) 100 UNIT/ML Solostar Pen Inject 110 Units into the skin every morning. 08/26/17   Claretta Fraise, MD  Insulin Pen Needle (NOVOFINE) 30G X 8 MM MISC use as directed 03/03/18   Claretta Fraise, MD  levothyroxine (SYNTHROID, LEVOTHROID) 125 MCG tablet Take 1 tablet (125 mcg total) by mouth daily. 03/03/18   Claretta Fraise, MD  metFORMIN (GLUCOPHAGE) 1000 MG tablet TAKE 1 TABLET BY MOUTH 2 TIMES DAILY WITH A MEAL. 03/03/18   Claretta Fraise, MD  olmesartan (BENICAR) 20 MG tablet Take 1 tablet (20 mg total) by mouth daily. 03/03/18   Claretta Fraise, MD    Physical Exam: Vitals:   03/09/18 0330 03/09/18 0530 03/09/18 0600 03/09/18 0700  BP: 138/69 138/72 137/64 136/66  Pulse: 86 79 66   Resp:  14 18   Temp:    (!) 97 F (36.1 C)  TempSrc:      SpO2: 97% 94% 98% 97%  Weight:      Height:         General:   Appears well-nourished, slightly anxious in no acute distress. Eyes:  PERRL, EOMI, normal lids, iris ENT:  grossly normal hearing, lips & tongue, mucous membranes of her mouth are slightly dry but pink. Neck:  no LAD, masses or thyromegaly Cardiovascular:  RRR, no m/r/g. No LE edema.  Respiratory:  CTA bilaterally, no w/r/r. Normal respiratory effort. Abdomen:  soft, ntnd, positive bowel sounds no guarding or rebounding Skin:  no rash or induration seen on limited exam Musculoskeletal:  Left leg slightly externally rotated decreased range of motion and tenderness to palpation to that  left hip Psychiatric:  grossly normal mood and affect, speech fluent and appropriate, AOx3 Neurologic:  CN 2-12 grossly intact, moves all extremities in coordinated fashion, sensation intact  Labs on Admission: I have personally reviewed following labs and imaging studies  CBC: Recent Labs  Lab 03/09/18 0512  WBC 7.6  HGB 12.4  HCT 38.3  MCV 84.4  PLT 782   Basic Metabolic Panel: Recent Labs  Lab 03/09/18 0512  NA 134*  K 3.7  CL 100  CO2 23  GLUCOSE 265*  BUN 11  CREATININE 0.69  CALCIUM 8.5*   GFR: Estimated Creatinine Clearance: 59.8 mL/min (by C-G formula based on SCr of 0.69 mg/dL). Liver Function Tests: No results for input(s): AST, ALT, ALKPHOS, BILITOT, PROT, ALBUMIN in the last 168 hours. No results for input(s): LIPASE, AMYLASE in the last 168 hours. No results for input(s): AMMONIA in the last 168 hours. Coagulation Profile: Recent Labs  Lab 03/09/18 0512  INR 1.02   Cardiac Enzymes: No results for input(s): CKTOTAL, CKMB, CKMBINDEX, TROPONINI in the last 168 hours. BNP (last 3 results) No results for input(s): PROBNP in the last 8760 hours. HbA1C: No results for input(s): HGBA1C in the last 72 hours. CBG: Recent Labs  Lab 03/09/18 0313  GLUCAP 253*   Lipid Profile: No results for input(s): CHOL, HDL, LDLCALC, TRIG, CHOLHDL, LDLDIRECT in the last 72 hours. Thyroid  Function Tests: No results for input(s): TSH, T4TOTAL, FREET4, T3FREE, THYROIDAB in the last 72 hours. Anemia Panel: No results for input(s): VITAMINB12, FOLATE, FERRITIN, TIBC, IRON, RETICCTPCT in the last 72 hours. Urine analysis:    Component Value Date/Time   COLORURINE YELLOW 06/30/2008 0645   APPEARANCEUR Clear 02/08/2017 1049   LABSPEC 1.019 06/30/2008 0645   PHURINE 5.5 06/30/2008 0645   GLUCOSEU Negative 02/08/2017 1049   HGBUR NEGATIVE 06/30/2008 0645   BILIRUBINUR Negative 02/08/2017 1049   KETONESUR NEGATIVE 06/30/2008 0645   PROTEINUR Negative 02/08/2017 1049   PROTEINUR NEGATIVE 06/30/2008 0645   UROBILINOGEN negative 09/23/2015 1056   UROBILINOGEN 2.0 (H) 06/30/2008 0645   NITRITE Negative 02/08/2017 1049   NITRITE NEGATIVE 06/30/2008 0645   LEUKOCYTESUR Trace (A) 02/08/2017 1049    Creatinine Clearance: Estimated Creatinine Clearance: 59.8 mL/min (by C-G formula based on SCr of 0.69 mg/dL).  Sepsis Labs: @LABRCNTIP (procalcitonin:4,lacticidven:4) )No results found for this or any previous visit (from the past 240 hour(s)).   Radiological Exams on Admission: Dg Ankle Complete Left  Result Date: 03/09/2018 CLINICAL DATA:  Initial evaluation for acute trauma, fall. EXAM: LEFT ANKLE COMPLETE - 3+ VIEW COMPARISON:  None. FINDINGS: No acute fracture or dislocation. Ankle mortise approximated. Talar dome intact. No acute soft tissue abnormality. Posterior and plantar calcaneal enthesophytes noted. IMPRESSION: No acute osseous abnormality about the left ankle. Electronically Signed   By: Jeannine Boga M.D.   On: 03/09/2018 04:33   Dg Chest Port 1 View  Result Date: 03/09/2018 CLINICAL DATA:  Preoperative evaluation for left hip fracture. EXAM: PORTABLE CHEST 1 VIEW COMPARISON:  Prior radiograph from 04/19/2010 FINDINGS: Mild cardiomegaly, stable. Mediastinal silhouette normal. Mild aortic atherosclerosis. Lungs are hypoinflated. No focal infiltrates. No pulmonary  edema or pleural effusion. No pneumothorax. No acute osseous abnormality. IMPRESSION: 1. No active cardiopulmonary disease. 2. Mild cardiomegaly, stable. Electronically Signed   By: Jeannine Boga M.D.   On: 03/09/2018 07:21   Dg Knee Complete 4 Views Left  Result Date: 03/09/2018 CLINICAL DATA:  Initial evaluation for acute fall, pain. EXAM: LEFT KNEE - COMPLETE 4+ VIEW  COMPARISON:  Prior radiograph from 09/22/2016 FINDINGS: No acute fracture or dislocation. No joint effusion. Enthesophyte at the superior pole the patella. Mild degenerative osteoarthritic changes about the knee. Osseous mineralization normal. No soft tissue abnormality. IMPRESSION: No acute osseous abnormality about the knee. Electronically Signed   By: Jeannine Boga M.D.   On: 03/09/2018 04:34   Dg Hip Unilat W Or Wo Pelvis 2-3 Views Left  Result Date: 03/09/2018 CLINICAL DATA:  Initial evaluation for acute trauma, fall. EXAM: DG HIP (WITH OR WITHOUT PELVIS) 2-3V LEFT COMPARISON:  None. FINDINGS: Acute subcapital fracture of the left femoral neck with slight impaction. Femoral head remains in normal alignment within the acetabulum. Bony pelvis intact. SI joints approximated. No acute abnormality about the right hip. Soft tissues within normal limits. IMPRESSION: Acute subcapital fracture of the left femoral neck with mild impaction. Electronically Signed   By: Jeannine Boga M.D.   On: 03/09/2018 04:32    EKG: Independently reviewed. Sinus rhythm Atrial premature complex Right bundle branch block  Assessment/Plan Principal Problem:   Closed left hip fracture, initial encounter ([place]) Active Problems:   Mixed diabetic hyperlipidemia associated with type 2 diabetes mellitus ([place])   Essential hypertension   Other specified hypothyroidism   #1. Left hip fracture after mechanical fall. X-ray confirms subcapital fracture of the left femoral neck. Evaluated by orthopedics who opined surgery likely tomorrow. Chest  x-ray and EKG as noted above. INR 1.02 -Admit -Pain management -Nothing by mouth past midnight -Further management per orthopedics  #2. Diabetes. Type II. Home medications include Lantus and trulicity and metformin. Serum glucose 265 on admission. -We will continue Lantus at lower dose -Sliding scale insulin for optimal control -Obtain a hemoglobin A1c  #3. Hypertension.controlled in the emergency department. Home medications include Benicar -resume benicar tomorrow -monitor  #4. Hypothyroidism -Continue home meds     DVT prophylaxis: heparin x2 doses  Code Status: full  Family Communication: significant other  Disposition Plan: home  Consults called:  [facility] Admission status: inpatient    Radene Gunning MD [facility]  If 7PM-7AM, please contact night-coverage [email or web address] Password [email or web address]  03/09/2018, 9:01 AM

## 2018-03-09 NOTE — ED Notes (Signed)
Patient transported to X-ray 

## 2018-03-09 NOTE — ED Provider Notes (Addendum)
[facility] EMERGENCY DEPARTMENT Provider Note   CSN: [id number] Arrival date & time: 03/08/18  2315     History   Chief Complaint Chief Complaint  Patient presents with  . Fall    HPI Morgan Schmidt is a 67 y.o. female.  Patient presents to the emergency department for evaluation of left hip pain after a fall.  Patient reports that she was walking and slipped on something, fell to the left.  She tried to catch herself but could not, landed on the left lateral hip.  She has been having severe pain on the outside portion of the hip ever since the fall.  She did not hit her head.  No loss of consciousness.  Denies upper extremity injury, chest pain, abdominal pain, cervical, thoracic, lumbar pain.     Past Medical History:  Diagnosis Date  . Arthritis   . Cancer ([place])    thyroid CA  . Diabetes mellitus    type 2  . Thyroid disease     Patient Active Problem List   Diagnosis Date Noted  . Essential hypertension 03/23/2016  . Other specified hypothyroidism 12/16/2014  . Mixed diabetic hyperlipidemia associated with type 2 diabetes mellitus ([place]) 12/16/2014  . Long term current use of insulin ([place]) 12/16/2014  . Polyposis of colon 12/16/2014  . History of thyroid cancer 08/24/2011    Past Surgical History:  Procedure Laterality Date  . CESAREAN SECTION    . KNEE ARTHROSCOPY Left 10/22/2016   Procedure: ARTHROSCOPY LEFT KNEE WITH DEBRIDEMENT;  Surgeon: Paralee Cancel, MD;  Location: [facility];  Service: Orthopedics;  Laterality: Left;  . KNEE ARTHROSCOPY WITH MEDIAL MENISECTOMY  10/22/2016   Procedure: KNEE ARTHROSCOPY WITH MEDIAL MENISECTOMY;  Surgeon: Paralee Cancel, MD;  Location: [facility];  Service: Orthopedics;;  . KNEE ARTHROSCOPY WITH SUBCHONDROPLASTY Left 10/22/2016   Procedure: KNEE ARTHROSCOPY WITH MEDIAL CHONDROPLASTY;  Surgeon: Paralee Cancel, MD;  Location: [facility];  Service: Orthopedics;   Laterality: Left;  . THYROIDECTOMY  2009     OB History   None      Home Medications    Prior to Admission medications   Medication Sig Start Date End Date Taking? Authorizing Provider  ACCU-CHEK FASTCLIX LANCETS MISC Use to check Blood Sugars twice daily. 03/03/18   Claretta Fraise, MD  Cholecalciferol (VITAMIN D) 2000 UNITS tablet Take 1 tablet (2,000 Units total) by mouth daily. Patient taking differently: Take 2,000 Units by mouth every evening.  03/18/15   Claretta Fraise, MD  Choline Fenofibrate (FENOFIBRIC ACID) 135 MG CPDR Take 1 capsule by mouth daily. 11/29/17   Claretta Fraise, MD  diclofenac sodium (VOLTAREN) 1 % GEL Apply topically 4 (four) times daily.    [provider]  Dulaglutide (TRULICITY) 1.5 CN/4.7SJ SOPN INJECT 1 PEN INTO THE SKIN WEEKLY 03/03/18   Claretta Fraise, MD  ezetimibe (ZETIA) 10 MG tablet TAKE 1 TABLET BY MOUTH ONCE DAILY FOR CHOLESTEROL 11/29/17   Claretta Fraise, MD  glucose blood (ACCU-CHEK GUIDE) test strip Use as instructed 11/29/17   Claretta Fraise, MD  Insulin Glargine (LANTUS SOLOSTAR) 100 UNIT/ML Solostar Pen Inject 110 Units into the skin every morning. 08/26/17   Claretta Fraise, MD  Insulin Pen Needle (NOVOFINE) 30G X 8 MM MISC use as directed 03/03/18   Claretta Fraise, MD  levothyroxine (SYNTHROID, LEVOTHROID) 125 MCG tablet Take 1 tablet (125 mcg total) by mouth daily. 03/03/18   Claretta Fraise, MD  metFORMIN (GLUCOPHAGE) 1000 MG  tablet TAKE 1 TABLET BY MOUTH 2 TIMES DAILY WITH A MEAL. 03/03/18   Claretta Fraise, MD  olmesartan (BENICAR) 20 MG tablet Take 1 tablet (20 mg total) by mouth daily. 03/03/18   Claretta Fraise, MD    Family History Family History  Problem Relation Age of Onset  . Colon cancer Neg Hx   . Esophageal cancer Neg Hx   . Rectal cancer Neg Hx   . Stomach cancer Neg Hx   . Breast cancer Neg Hx     Social History Social History   Tobacco Use  . Smoking status: Former Smoker    Last attempt to quit: 08/23/2000    Years  since quitting: 17.5  . Smokeless tobacco: Never Used  Substance Use Topics  . Alcohol use: No  . Drug use: No     Allergies   Ace inhibitors   Review of Systems Review of Systems  Musculoskeletal: Positive for arthralgias.  All other systems reviewed and are negative.    Physical Exam Updated Vital Signs BP 138/69   Pulse 86   Temp 98.6 F (37 C) (Oral)   Resp 16   Ht 4\' 11"  (1.499 m)   Wt 70.3 kg (155 lb)   LMP 03/02/2004   SpO2 97%   BMI 31.31 kg/m   Physical Exam  Constitutional: She is oriented to person, place, and time. She appears well-developed and well-nourished. No distress.  HENT:  Head: Normocephalic and atraumatic.  Right Ear: Hearing normal.  Left Ear: Hearing normal.  Nose: Nose normal.  Mouth/Throat: Oropharynx is clear and moist and mucous membranes are normal.  Eyes: Pupils are equal, round, and reactive to light. Conjunctivae and EOM are normal.  Neck: Normal range of motion. Neck supple.  Cardiovascular: Regular rhythm, S1 normal and S2 normal. Exam reveals no gallop and no friction rub.  No murmur heard. Pulmonary/Chest: Effort normal and breath sounds normal. No respiratory distress. She exhibits no tenderness.  Abdominal: Soft. Normal appearance and bowel sounds are normal. There is no hepatosplenomegaly. There is no tenderness. There is no rebound, no guarding, no tenderness at McBurney's point and negative Murphy's sign. No hernia.  Musculoskeletal:       Left hip: She exhibits decreased range of motion and tenderness.       Left knee: She exhibits normal range of motion, no swelling and no deformity. Tenderness found.       Left ankle: She exhibits normal range of motion, no swelling and no deformity. Tenderness.  Neurological: She is alert and oriented to person, place, and time. She has normal strength. No cranial nerve deficit or sensory deficit. Coordination normal. GCS eye subscore is 4. GCS verbal subscore is 5. GCS motor subscore is  6.  Skin: Skin is warm, dry and intact. No rash noted. No cyanosis.  Psychiatric: She has a normal mood and affect. Her speech is normal and behavior is normal. Thought content normal.  Nursing note and vitals reviewed.    ED Treatments / Results  Labs (all labs ordered are listed, but only abnormal results are displayed) Labs Reviewed  CBG MONITORING, ED - Abnormal; Notable for the following components:      Result Value   Glucose-Capillary 253 (*)    All other components within normal limits  CBC  BASIC METABOLIC PANEL  PROTIME-INR    EKG None  Radiology Dg Ankle Complete Left  Result Date: 03/09/2018 CLINICAL DATA:  Initial evaluation for acute trauma, fall. EXAM: LEFT ANKLE COMPLETE -  3+ VIEW COMPARISON:  None. FINDINGS: No acute fracture or dislocation. Ankle mortise approximated. Talar dome intact. No acute soft tissue abnormality. Posterior and plantar calcaneal enthesophytes noted. IMPRESSION: No acute osseous abnormality about the left ankle. Electronically Signed   By: Jeannine Boga M.D.   On: 03/09/2018 04:33   Dg Knee Complete 4 Views Left  Result Date: 03/09/2018 CLINICAL DATA:  Initial evaluation for acute fall, pain. EXAM: LEFT KNEE - COMPLETE 4+ VIEW COMPARISON:  Prior radiograph from 09/22/2016 FINDINGS: No acute fracture or dislocation. No joint effusion. Enthesophyte at the superior pole the patella. Mild degenerative osteoarthritic changes about the knee. Osseous mineralization normal. No soft tissue abnormality. IMPRESSION: No acute osseous abnormality about the knee. Electronically Signed   By: Jeannine Boga M.D.   On: 03/09/2018 04:34   Dg Hip Unilat W Or Wo Pelvis 2-3 Views Left  Result Date: 03/09/2018 CLINICAL DATA:  Initial evaluation for acute trauma, fall. EXAM: DG HIP (WITH OR WITHOUT PELVIS) 2-3V LEFT COMPARISON:  None. FINDINGS: Acute subcapital fracture of the left femoral neck with slight impaction. Femoral head remains in normal  alignment within the acetabulum. Bony pelvis intact. SI joints approximated. No acute abnormality about the right hip. Soft tissues within normal limits. IMPRESSION: Acute subcapital fracture of the left femoral neck with mild impaction. Electronically Signed   By: Jeannine Boga M.D.   On: 03/09/2018 04:32    Procedures Procedures (including critical care time)  Medications Ordered in ED Medications  oxyCODONE-acetaminophen (PERCOCET/ROXICET) 5-325 MG per tablet 1 tablet (1 tablet Oral Given 03/09/18 0314)  morphine 4 MG/ML injection 4 mg (4 mg Intravenous Given 03/09/18 0513)  ondansetron (ZOFRAN) injection 4 mg (4 mg Intravenous Given 03/09/18 0513)     Initial Impression / Assessment and Plan / ED Course  I have reviewed the triage vital signs and the nursing notes.  Pertinent labs & imaging results that were available during my care of the patient were reviewed by me and considered in my medical decision making (see chart for details).     Patient presents to the ER for evaluation of left hip pain after a fall.  No deformity noted on examination, patient has tenderness and pain with motion of the hip.  X-ray confirms subcapital fracture of the left femoral neck with impaction.  Will require hospitalization for management of hip fracture.  Discussed with Dr. Tonita Cong, on-call for orthopedics (Dr. Alvan Dame performed left total surgery last year).  Will be seen in consultation, no surgery today (likely Monday).  Final Clinical Impressions(s) / ED Diagnoses   Final diagnoses:  Subcapital fracture of hip, left, closed, initial encounter [facility])    ED Discharge Orders    None       Orpah Greek, MD 03/09/18 6578    Orpah Greek, MD 03/09/18 [phone]

## 2018-03-09 NOTE — Progress Notes (Signed)
Received report from ED AT 0639. Pt arrived on 5N at 0658 am. Pt is in stable condition. VS stable report given to day shift nurse.

## 2018-03-09 NOTE — Consult Note (Signed)
Reason for Consult:left hip fracture Referring Physician: EDP  Morgan Schmidt is an 66 y.o. female.  HPI: [place] yesterday  Past Medical History:  Diagnosis Date  . Arthritis   . Cancer ([place])    thyroid CA  . Diabetes mellitus    type 2  . Hip fracture ([place])   . Thyroid disease     Past Surgical History:  Procedure Laterality Date  . CESAREAN SECTION    . KNEE ARTHROSCOPY Left 10/22/2016   Procedure: ARTHROSCOPY LEFT KNEE WITH DEBRIDEMENT;  Surgeon: Paralee Cancel, MD;  Location: [facility];  Service: Orthopedics;  Laterality: Left;  . KNEE ARTHROSCOPY WITH MEDIAL MENISECTOMY  10/22/2016   Procedure: KNEE ARTHROSCOPY WITH MEDIAL MENISECTOMY;  Surgeon: Paralee Cancel, MD;  Location: [facility];  Service: Orthopedics;;  . KNEE ARTHROSCOPY WITH SUBCHONDROPLASTY Left 10/22/2016   Procedure: KNEE ARTHROSCOPY WITH MEDIAL CHONDROPLASTY;  Surgeon: Paralee Cancel, MD;  Location: [facility];  Service: Orthopedics;  Laterality: Left;  . THYROIDECTOMY  2009    Family History  Problem Relation Age of Onset  . Colon cancer Neg Hx   . Esophageal cancer Neg Hx   . Rectal cancer Neg Hx   . Stomach cancer Neg Hx   . Breast cancer Neg Hx     Social History:  reports that she quit smoking about 17 years ago. She has never used smokeless tobacco. She reports that she does not drink alcohol or use drugs.  Allergies:  Allergies  Allergen Reactions  . Ace Inhibitors Cough    Medications: I have reviewed the patient's current medications.  Results for orders placed or performed during the hospital encounter of 03/08/18 (from the past 48 hour(s))  POC CBG, ED     Status: Abnormal   Collection Time: 03/09/18  3:13 AM  Result Value Ref Range   Glucose-Capillary 253 (H) 70 - 99 mg/dL  CBC     Status: None   Collection Time: 03/09/18  5:12 AM  Result Value Ref Range   WBC 7.6 4.0 - 10.5 K/uL   RBC 4.54 3.87 - 5.11 MIL/uL   Hemoglobin 12.4 12.0 - 15.0  g/dL   HCT 38.3 36.0 - 46.0 %   MCV 84.4 78.0 - 100.0 fL   MCH 27.3 26.0 - 34.0 pg   MCHC 32.4 30.0 - 36.0 g/dL   RDW 13.4 11.5 - 15.5 %   Platelets 212 150 - 400 K/uL    Comment: Performed at [facility], [place] [address], [place], [place] [address]  Basic metabolic panel     Status: Abnormal   Collection Time: 03/09/18  5:12 AM  Result Value Ref Range   Sodium 134 (L) 135 - 145 mmol/L   Potassium 3.7 3.5 - 5.1 mmol/L   Chloride 100 98 - 111 mmol/L    Comment: Please note change in reference range.   CO2 23 22 - 32 mmol/L   Glucose, Bld 265 (H) 70 - 99 mg/dL    Comment: Please note change in reference range.   BUN 11 8 - 23 mg/dL    Comment: Please note change in reference range.   Creatinine, Ser 0.69 0.44 - 1.00 mg/dL   Calcium 8.5 (L) 8.9 - 10.3 mg/dL   GFR calc non Af Amer >60 >60 mL/min   GFR calc Af Amer >60 >60 mL/min    Comment: (NOTE) The eGFR has been calculated using the CKD EPI equation. This calculation has not been  validated in all clinical situations. eGFR's persistently <60 mL/min signify possible Chronic Kidney Disease.    Anion gap 11 5 - 15    Comment: Performed at [facility] [address] [place], [place] [address]  Protime-INR     Status: None   Collection Time: 03/09/18  5:12 AM  Result Value Ref Range   Prothrombin Time 13.3 11.4 - 15.2 seconds   INR 1.02     Comment: Performed at [facility] [address] [place], [place] [address]  Type and screen [place]     Status: None   Collection Time: 03/09/18  6:05 AM  Result Value Ref Range   ABO/RH(D) O POS    Antibody Screen NEG    Sample Expiration      03/12/2018 Performed at [facility], [place] [address], [place], [place] [address]   ABO/Rh     Status: None (Preliminary result)   Collection Time: 03/09/18  6:05 AM  Result Value Ref Range   ABO/RH(D)      O POS Performed at [facility] [address], [place],  [address]      Dg Ankle Complete Left  Result Date: 03/09/2018 CLINICAL DATA:  Initial evaluation for acute trauma, fall. EXAM: LEFT ANKLE COMPLETE - 3+ VIEW COMPARISON:  None. FINDINGS: No acute fracture or dislocation. Ankle mortise approximated. Talar dome intact. No acute soft tissue abnormality. Posterior and plantar calcaneal enthesophytes noted. IMPRESSION: No acute osseous abnormality about the left ankle. Electronically Signed   By: Jeannine Boga M.D.   On: 03/09/2018 04:33   Dg Chest Port 1 View  Result Date: 03/09/2018 CLINICAL DATA:  Preoperative evaluation for left hip fracture. EXAM: PORTABLE CHEST 1 VIEW COMPARISON:  Prior radiograph from 04/19/2010 FINDINGS: Mild cardiomegaly, stable. Mediastinal silhouette normal. Mild aortic atherosclerosis. Lungs are hypoinflated. No focal infiltrates. No pulmonary edema or pleural effusion. No pneumothorax. No acute osseous abnormality. IMPRESSION: 1. No active cardiopulmonary disease. 2. Mild cardiomegaly, stable. Electronically Signed   By: Jeannine Boga M.D.   On: 03/09/2018 07:21   Dg Knee Complete 4 Views Left  Result Date: 03/09/2018 CLINICAL DATA:  Initial evaluation for acute fall, pain. EXAM: LEFT KNEE - COMPLETE 4+ VIEW COMPARISON:  Prior radiograph from 09/22/2016 FINDINGS: No acute fracture or dislocation. No joint effusion. Enthesophyte at the superior pole the patella. Mild degenerative osteoarthritic changes about the knee. Osseous mineralization normal. No soft tissue abnormality. IMPRESSION: No acute osseous abnormality about the knee. Electronically Signed   By: Jeannine Boga M.D.   On: 03/09/2018 04:34   Dg Hip Unilat W Or Wo Pelvis 2-3 Views Left  Result Date: 03/09/2018 CLINICAL DATA:  Initial evaluation for acute trauma, fall. EXAM: DG HIP (WITH OR WITHOUT PELVIS) 2-3V LEFT COMPARISON:  None. FINDINGS: Acute subcapital fracture of the left femoral neck with slight impaction. Femoral head remains in normal alignment  within the acetabulum. Bony pelvis intact. SI joints approximated. No acute abnormality about the right hip. Soft tissues within normal limits. IMPRESSION: Acute subcapital fracture of the left femoral neck with mild impaction. Electronically Signed   By: Jeannine Boga M.D.   On: 03/09/2018 04:32    Review of Systems  Musculoskeletal: Positive for falls and joint pain.  All other systems reviewed and are negative.  Blood pressure 136/66, pulse 66, temperature (!) 97 F (36.1 C), resp. rate 18, height _0  (1.499 m), weight 70.3 kg (155 lb), last menstrual  period 03/02/2004, SpO2 97 %. Physical Exam  Constitutional: She is oriented to person, place, and time. She appears well-developed.  Per EDP  HENT:  Head: Normocephalic.  Eyes: Pupils are equal, round, and reactive to light.  Neck: Normal range of motion.  Cardiovascular: Normal rate.  Respiratory: Effort normal.  GI: Soft.  Musculoskeletal: She exhibits tenderness.  Left hip pain with rotation. No DVT. NVI  Neurological: She is alert and oriented to person, place, and time.  Skin: Skin is warm and dry.  Psychiatric: She has a normal mood and affect.    Assessment/Plan:  Left femoral neck fracture with displacement. Will likely need Hip replacement. Will discuss with Dr. Alvan Dame for timing. Probable not today. IDDM glucose elevated. Discussed with Dr. Murriel Hopper C 03/09/2018, 8:12 AM

## 2018-03-10 ENCOUNTER — Encounter (HOSPITAL_COMMUNITY): Payer: Self-pay | Admitting: Certified Registered Nurse Anesthetist

## 2018-03-10 ENCOUNTER — Encounter (HOSPITAL_COMMUNITY)
Admission: EM | Disposition: A | Discharge status: Home / Self Care | Payer: Self-pay | Source: Home / Self Care | Attending: Family Medicine

## 2018-03-10 ENCOUNTER — Inpatient Hospital Stay (HOSPITAL_COMMUNITY): Payer: 59

## 2018-03-10 ENCOUNTER — Inpatient Hospital Stay (HOSPITAL_COMMUNITY): Payer: 59 | Admitting: Certified Registered Nurse Anesthetist

## 2018-03-10 DIAGNOSIS — E038 Other specified hypothyroidism: Secondary | ICD-10-CM

## 2018-03-10 DIAGNOSIS — I10 Essential (primary) hypertension: Secondary | ICD-10-CM

## 2018-03-10 DIAGNOSIS — E782 Mixed hyperlipidemia: Secondary | ICD-10-CM

## 2018-03-10 DIAGNOSIS — E1169 Type 2 diabetes mellitus with other specified complication: Secondary | ICD-10-CM

## 2018-03-10 DIAGNOSIS — Z794 Long term (current) use of insulin: Secondary | ICD-10-CM

## 2018-03-10 HISTORY — PX: HIP PINNING,CANNULATED: SHX1758

## 2018-03-10 LAB — GLUCOSE, CAPILLARY
GLUCOSE-CAPILLARY: 140 mg/dL — AB (ref 70–99)
GLUCOSE-CAPILLARY: 116 mg/dL — AB (ref 70–99)
GLUCOSE-CAPILLARY: 233 mg/dL — AB (ref 70–99)
Glucose-Capillary: 140 mg/dL — ABNORMAL HIGH (ref 70–99)
Glucose-Capillary: 191 mg/dL — ABNORMAL HIGH (ref 70–99)
Glucose-Capillary: 192 mg/dL — ABNORMAL HIGH (ref 70–99)
Glucose-Capillary: 240 mg/dL — ABNORMAL HIGH (ref 70–99)
Glucose-Capillary: 253 mg/dL — ABNORMAL HIGH (ref 70–99)

## 2018-03-10 LAB — BASIC METABOLIC PANEL
Anion gap: 11 (ref 5–15)
BUN: 9 mg/dL (ref 8–23)
CHLORIDE: 100 mmol/L (ref 98–111)
CO2: 23 mmol/L (ref 22–32)
CREATININE: 0.68 mg/dL (ref 0.44–1.00)
Calcium: 7.8 mg/dL — ABNORMAL LOW (ref 8.9–10.3)
GFR calc Af Amer: >60 mL/min (ref >60)
GFR calc non Af Amer: >60 mL/min (ref >60)
Glucose, Bld: 218 mg/dL — ABNORMAL HIGH (ref 70–99)
Potassium: 4 mmol/L (ref 3.5–5.1)
Sodium: 134 mmol/L — ABNORMAL LOW (ref 135–145)

## 2018-03-10 LAB — CBC
HEMATOCRIT: 36.4 % (ref 36.0–46.0)
HEMOGLOBIN: 11.8 g/dL — AB (ref 12.0–15.0)
MCH: 27.4 pg (ref 26.0–34.0)
MCHC: 32.4 g/dL (ref 30.0–36.0)
MCV: 84.5 fL (ref 78.0–100.0)
Platelets: 193 10*3/uL (ref 150–400)
RBC: 4.31 MIL/uL (ref 3.87–5.11)
RDW: 13.4 % (ref 11.5–15.5)
WBC: 6.8 10*3/uL (ref 4.0–10.5)

## 2018-03-10 LAB — SURGICAL PCR SCREEN
MRSA, PCR: POSITIVE — AB
Staphylococcus aureus: POSITIVE — AB

## 2018-03-10 SURGERY — FIXATION, FEMUR, NECK, PERCUTANEOUS, USING SCREW
Anesthesia: General | Site: Hip | Laterality: Left

## 2018-03-10 MED ORDER — MIDAZOLAM HCL 2 MG/2ML IJ SOLN
INTRAMUSCULAR | Status: AC
  Filled 2018-03-10: qty 2

## 2018-03-10 MED ORDER — METHOCARBAMOL 500 MG PO TABS: 500.0000 mg | ORAL_TABLET | Freq: Four times a day (QID) | ORAL | 0 refills | Status: DC | PRN

## 2018-03-10 MED ORDER — 0.9 % SODIUM CHLORIDE (POUR BTL) OPTIME
TOPICAL | Status: DC | PRN
  Administered 2018-03-10: 1000 mL

## 2018-03-10 MED ORDER — MUPIROCIN 2 % EX OINT
1.0000 "application " | TOPICAL_OINTMENT | Freq: Two times a day (BID) | CUTANEOUS | Status: DC
  Administered 2018-03-10 – 2018-03-12 (×5): 1 via NASAL
  Filled 2018-03-10: qty 22

## 2018-03-10 MED ORDER — HYDROCODONE-ACETAMINOPHEN 5-325 MG PO TABS
1.0000 | ORAL_TABLET | ORAL | Status: DC | PRN
  Administered 2018-03-10 – 2018-03-12 (×6): 2 via ORAL
  Filled 2018-03-10 (×6): qty 2

## 2018-03-10 MED ORDER — OXYCODONE HCL 5 MG PO TABS
ORAL_TABLET | ORAL | Status: AC
  Filled 2018-03-10: qty 1

## 2018-03-10 MED ORDER — FENTANYL CITRATE (PF) 250 MCG/5ML IJ SOLN
INTRAMUSCULAR | Status: AC
  Filled 2018-03-10: qty 5

## 2018-03-10 MED ORDER — MIDAZOLAM HCL 5 MG/5ML IJ SOLN
INTRAMUSCULAR | Status: DC | PRN
  Administered 2018-03-10: 2 mg via INTRAVENOUS

## 2018-03-10 MED ORDER — HYDROCODONE-ACETAMINOPHEN 7.5-325 MG PO TABS: 1.0000 | ORAL_TABLET | ORAL | Status: DC | PRN

## 2018-03-10 MED ORDER — FERROUS SULFATE 325 (65 FE) MG PO TABS
325.0000 mg | ORAL_TABLET | Freq: Three times a day (TID) | ORAL | Status: DC
  Administered 2018-03-11 – 2018-03-12 (×5): 325 mg via ORAL
  Filled 2018-03-10 (×5): qty 1

## 2018-03-10 MED ORDER — BISACODYL 10 MG RE SUPP: 10.0000 mg | Freq: Every day | RECTAL | Status: DC | PRN

## 2018-03-10 MED ORDER — DOCUSATE SODIUM 100 MG PO CAPS
100.0000 mg | ORAL_CAPSULE | Freq: Two times a day (BID) | ORAL | Status: DC
  Administered 2018-03-10 – 2018-03-12 (×5): 100 mg via ORAL
  Filled 2018-03-10 (×4): qty 1

## 2018-03-10 MED ORDER — ONDANSETRON HCL 4 MG PO TABS: 4.0000 mg | ORAL_TABLET | Freq: Four times a day (QID) | ORAL | Status: DC | PRN

## 2018-03-10 MED ORDER — DOCUSATE SODIUM 100 MG PO CAPS: 100.0000 mg | ORAL_CAPSULE | Freq: Two times a day (BID) | ORAL | 0 refills | Status: DC

## 2018-03-10 MED ORDER — FENTANYL CITRATE (PF) 100 MCG/2ML IJ SOLN
INTRAMUSCULAR | Status: DC | PRN
  Administered 2018-03-10 (×2): 50 ug via INTRAVENOUS

## 2018-03-10 MED ORDER — PHENOL 1.4 % MT LIQD: 1.0000 | OROMUCOSAL | Status: DC | PRN

## 2018-03-10 MED ORDER — ACETAMINOPHEN 325 MG PO TABS: 325.0000 mg | ORAL_TABLET | Freq: Four times a day (QID) | ORAL | Status: DC | PRN

## 2018-03-10 MED ORDER — ROCURONIUM BROMIDE 100 MG/10ML IV SOLN
INTRAVENOUS | Status: DC | PRN
  Administered 2018-03-10: 50 mg via INTRAVENOUS

## 2018-03-10 MED ORDER — CHLORHEXIDINE GLUCONATE 4 % EX LIQD
60.0000 mL | Freq: Once | CUTANEOUS | Status: AC
  Administered 2018-03-10: 4 via TOPICAL

## 2018-03-10 MED ORDER — HYDROCODONE-ACETAMINOPHEN 7.5-325 MG PO TABS: 1.0000 | ORAL_TABLET | ORAL | 0 refills | Status: DC | PRN

## 2018-03-10 MED ORDER — PROPOFOL 10 MG/ML IV BOLUS
INTRAVENOUS | Status: AC
  Filled 2018-03-10: qty 40

## 2018-03-10 MED ORDER — DEXAMETHASONE SODIUM PHOSPHATE 4 MG/ML IJ SOLN
INTRAMUSCULAR | Status: DC | PRN
  Administered 2018-03-10: 10 mg via INTRAVENOUS

## 2018-03-10 MED ORDER — PROPOFOL 10 MG/ML IV BOLUS
INTRAVENOUS | Status: DC | PRN
  Administered 2018-03-10: 50 mg via INTRAVENOUS
  Administered 2018-03-10: 150 mg via INTRAVENOUS

## 2018-03-10 MED ORDER — INSULIN GLARGINE 100 UNIT/ML ~~LOC~~ SOLN
15.0000 [IU] | Freq: Every day | SUBCUTANEOUS | Status: DC
  Administered 2018-03-10: 15 [IU] via SUBCUTANEOUS
  Filled 2018-03-10: qty 0.15

## 2018-03-10 MED ORDER — ONDANSETRON HCL 4 MG/2ML IJ SOLN: 4.0000 mg | Freq: Four times a day (QID) | INTRAMUSCULAR | Status: DC | PRN

## 2018-03-10 MED ORDER — METOCLOPRAMIDE HCL 5 MG PO TABS: 5.0000 mg | ORAL_TABLET | Freq: Three times a day (TID) | ORAL | Status: DC | PRN

## 2018-03-10 MED ORDER — CEFAZOLIN SODIUM-DEXTROSE 2-4 GM/100ML-% IV SOLN
2.0000 g | INTRAVENOUS | Status: AC
  Administered 2018-03-10: 2 g via INTRAVENOUS
  Filled 2018-03-10: qty 100

## 2018-03-10 MED ORDER — FERROUS SULFATE 325 (65 FE) MG PO TABS: 325.0000 mg | ORAL_TABLET | Freq: Three times a day (TID) | ORAL | 3 refills | Status: DC

## 2018-03-10 MED ORDER — OXYCODONE HCL 5 MG/5ML PO SOLN: 5.0000 mg | Freq: Once | ORAL | Status: AC | PRN

## 2018-03-10 MED ORDER — PHENYLEPHRINE HCL 10 MG/ML IJ SOLN
INTRAMUSCULAR | Status: DC | PRN
  Administered 2018-03-10: 80 ug via INTRAVENOUS
  Administered 2018-03-10: 40 ug via INTRAVENOUS
  Administered 2018-03-10: 80 ug via INTRAVENOUS

## 2018-03-10 MED ORDER — MORPHINE SULFATE (PF) 2 MG/ML IV SOLN: 0.5000 mg | INTRAVENOUS | Status: DC | PRN

## 2018-03-10 MED ORDER — ONDANSETRON HCL 4 MG/2ML IJ SOLN
INTRAMUSCULAR | Status: DC | PRN
  Administered 2018-03-10: 4 mg via INTRAVENOUS

## 2018-03-10 MED ORDER — SENNOSIDES-DOCUSATE SODIUM 8.6-50 MG PO TABS: 1.0000 | ORAL_TABLET | Freq: Every evening | ORAL | Status: DC | PRN

## 2018-03-10 MED ORDER — POLYETHYLENE GLYCOL 3350 17 G PO PACK: 17.0000 g | PACK | Freq: Two times a day (BID) | ORAL | 0 refills | Status: DC

## 2018-03-10 MED ORDER — IRBESARTAN 150 MG PO TABS
150.0000 mg | ORAL_TABLET | Freq: Every day | ORAL | Status: DC
  Administered 2018-03-11 – 2018-03-12 (×3): 150 mg via ORAL
  Filled 2018-03-10 (×2): qty 1

## 2018-03-10 MED ORDER — OXYCODONE HCL 5 MG PO TABS
5.0000 mg | ORAL_TABLET | Freq: Once | ORAL | Status: AC | PRN
  Administered 2018-03-10: 5 mg via ORAL

## 2018-03-10 MED ORDER — LACTATED RINGERS IV SOLN
INTRAVENOUS | Status: DC
  Administered 2018-03-10 – 2018-03-11 (×3): via INTRAVENOUS

## 2018-03-10 MED ORDER — FENTANYL CITRATE (PF) 100 MCG/2ML IJ SOLN: 25.0000 ug | INTRAMUSCULAR | Status: DC | PRN

## 2018-03-10 MED ORDER — ONDANSETRON HCL 4 MG/2ML IJ SOLN
INTRAMUSCULAR | Status: AC
  Filled 2018-03-10: qty 2

## 2018-03-10 MED ORDER — ONDANSETRON HCL 4 MG/2ML IJ SOLN: 4.0000 mg | Freq: Once | INTRAMUSCULAR | Status: DC | PRN

## 2018-03-10 MED ORDER — EPHEDRINE SULFATE 50 MG/ML IJ SOLN
INTRAMUSCULAR | Status: DC | PRN
  Administered 2018-03-10 (×3): 10 mg via INTRAVENOUS

## 2018-03-10 MED ORDER — METOCLOPRAMIDE HCL 5 MG/ML IJ SOLN: 5.0000 mg | Freq: Three times a day (TID) | INTRAMUSCULAR | Status: DC | PRN

## 2018-03-10 MED ORDER — CELECOXIB 200 MG PO CAPS
200.0000 mg | ORAL_CAPSULE | Freq: Two times a day (BID) | ORAL | Status: DC
  Administered 2018-03-10 – 2018-03-12 (×5): 200 mg via ORAL
  Filled 2018-03-10 (×4): qty 1

## 2018-03-10 MED ORDER — ASPIRIN EC 325 MG PO TBEC
325.0000 mg | DELAYED_RELEASE_TABLET | Freq: Two times a day (BID) | ORAL | Status: DC
  Administered 2018-03-11 – 2018-03-12 (×3): 325 mg via ORAL
  Filled 2018-03-10 (×3): qty 1

## 2018-03-10 MED ORDER — POVIDONE-IODINE 10 % EX SWAB
2.0000 "application " | Freq: Once | CUTANEOUS | Status: AC
  Administered 2018-03-10: 2 via TOPICAL

## 2018-03-10 MED ORDER — LIDOCAINE HCL (CARDIAC) PF 100 MG/5ML IV SOSY
PREFILLED_SYRINGE | INTRAVENOUS | Status: DC | PRN
  Administered 2018-03-10: 60 mg via INTRAVENOUS

## 2018-03-10 MED ORDER — SUGAMMADEX SODIUM 200 MG/2ML IV SOLN
INTRAVENOUS | Status: DC | PRN
  Administered 2018-03-10: 300 mg via INTRAVENOUS

## 2018-03-10 MED ORDER — ASPIRIN 81 MG PO CHEW: 81.0000 mg | CHEWABLE_TABLET | Freq: Two times a day (BID) | ORAL | 0 refills | Status: AC

## 2018-03-10 MED ORDER — CHLORHEXIDINE GLUCONATE CLOTH 2 % EX PADS
6.0000 | MEDICATED_PAD | Freq: Every day | CUTANEOUS | Status: DC
  Administered 2018-03-10 – 2018-03-12 (×3): 6 via TOPICAL

## 2018-03-10 MED ORDER — CEFAZOLIN SODIUM-DEXTROSE 2-4 GM/100ML-% IV SOLN
2.0000 g | Freq: Four times a day (QID) | INTRAVENOUS | Status: AC
  Administered 2018-03-10 – 2018-03-11 (×2): 2 g via INTRAVENOUS
  Filled 2018-03-10 (×2): qty 100

## 2018-03-10 MED ORDER — INSULIN ASPART 100 UNIT/ML ~~LOC~~ SOLN
0.0000 [IU] | SUBCUTANEOUS | Status: DC
  Administered 2018-03-10: 2 [IU] via SUBCUTANEOUS
  Administered 2018-03-10 – 2018-03-11 (×2): 8 [IU] via SUBCUTANEOUS

## 2018-03-10 MED ORDER — MENTHOL 3 MG MT LOZG: 1.0000 | LOZENGE | OROMUCOSAL | Status: DC | PRN

## 2018-03-10 SURGICAL SUPPLY — 47 items
BIT DRILL 4.8X200 CANN (BIT) ×1 IMPLANT
BNDG GAUZE ELAST 4 BULKY (GAUZE/BANDAGES/DRESSINGS) ×1 IMPLANT
COVER PERINEAL POST (MISCELLANEOUS) ×2 IMPLANT
COVER SURGICAL LIGHT HANDLE (MISCELLANEOUS) ×2 IMPLANT
DERMABOND ADVANCED (GAUZE/BANDAGES/DRESSINGS) ×1
DERMABOND ADVANCED .7 DNX12 (GAUZE/BANDAGES/DRESSINGS) IMPLANT
DRAPE STERI IOBAN 125X83 (DRAPES) ×2 IMPLANT
DRAPE SURG 17X23 STRL (DRAPES) ×4 IMPLANT
DRSG ADAPTIC 3X8 NADH LF (GAUZE/BANDAGES/DRESSINGS) ×1 IMPLANT
DRSG AQUACEL AG ADV 3.5X 4 (GAUZE/BANDAGES/DRESSINGS) ×1 IMPLANT
DRSG MEPILEX BORDER 4X4 (GAUZE/BANDAGES/DRESSINGS) ×2 IMPLANT
DURAPREP 26ML APPLICATOR (WOUND CARE) ×2 IMPLANT
ELECT REM PT RETURN 9FT ADLT (ELECTROSURGICAL) ×2
ELECTRODE REM PT RTRN 9FT ADLT (ELECTROSURGICAL) ×1 IMPLANT
FACESHIELD WRAPAROUND (MASK) ×2 IMPLANT
FACESHIELD WRAPAROUND OR TEAM (MASK) ×1 IMPLANT
GLOVE BIOGEL PI IND STRL 7.5 (GLOVE) ×1 IMPLANT
GLOVE BIOGEL PI IND STRL 8 (GLOVE) ×1 IMPLANT
GLOVE BIOGEL PI INDICATOR 7.5 (GLOVE) ×1
GLOVE BIOGEL PI INDICATOR 8 (GLOVE) ×1
GLOVE ORTHO TXT STRL SZ7.5 (GLOVE) ×2 IMPLANT
GLOVE SURG ORTHO 8.0 STRL STRW (GLOVE) ×2 IMPLANT
GOWN STRL REUS W/ TWL LRG LVL3 (GOWN DISPOSABLE) ×3 IMPLANT
GOWN STRL REUS W/TWL LRG LVL3 (GOWN DISPOSABLE) ×3
KIT BASIN OR (CUSTOM PROCEDURE TRAY) ×2 IMPLANT
KIT TURNOVER KIT B (KITS) ×2 IMPLANT
LINER BOOT UNIVERSAL DISP (MISCELLANEOUS) ×2 IMPLANT
MANIFOLD NEPTUNE II (INSTRUMENTS) ×1 IMPLANT
NS IRRIG 1000ML POUR BTL (IV SOLUTION) ×2 IMPLANT
PACK GENERAL/GYN (CUSTOM PROCEDURE TRAY) ×2 IMPLANT
PAD ARMBOARD 7.5X6 YLW CONV (MISCELLANEOUS) ×4 IMPLANT
PIN GUIDE DRILL TIP 2.8X300 (DRILL) ×3 IMPLANT
SCREW 8.0X80MMX16 (Screw) ×1 IMPLANT
SCREW CANN 8.0X70 16 THRD (Screw) IMPLANT
SCREW CANNULATED 8.0X70MM (Screw) ×2 IMPLANT
SCREW CANNULATED 8.0X75MM (Screw) ×1 IMPLANT
STAPLER VISISTAT 35W (STAPLE) ×1 IMPLANT
SUT MNCRL AB 4-0 PS2 18 (SUTURE) ×1 IMPLANT
SUT VIC AB 0 CT1 27 (SUTURE) ×1
SUT VIC AB 0 CT1 27XBRD ANBCTR (SUTURE) ×1 IMPLANT
SUT VIC AB 1 CT1 27 (SUTURE) ×1
SUT VIC AB 1 CT1 27XBRD ANBCTR (SUTURE) IMPLANT
SUT VIC AB 2-0 CT1 27 (SUTURE) ×1
SUT VIC AB 2-0 CT1 TAPERPNT 27 (SUTURE) ×1 IMPLANT
TOWEL OR 17X24 6PK STRL BLUE (TOWEL DISPOSABLE) ×2 IMPLANT
TOWEL OR 17X26 10 PK STRL BLUE (TOWEL DISPOSABLE) ×2 IMPLANT
WATER STERILE IRR 1000ML POUR (IV SOLUTION) ×2 IMPLANT

## 2018-03-10 NOTE — Consult Note (Signed)
   [facility] CM Inpatient Consult   03/10/2018  SHADAVIA DAMPIER 28-Apr-1951 [id number]    Came to visit Ms. Domingo Cocking on behalf of [place] [employer] program for [facility] [facility] employees/dependents with [employer] insurance.  Discussed that [facility] will contact her post hospital discharge. Ms. Kosloski expresses appreciation of visit. However, she reports she is retiring.   Provided 24-hr nurse advice line magnet.   Marthenia Rolling, MSN-Ed, RN,BSN [facility] Liaison [phone]

## 2018-03-10 NOTE — Interval H&P Note (Signed)
History and Physical Interval Note:  03/10/2018 6:08 PM  Morgan Schmidt  has presented today for surgery, with the diagnosis of femoral neck fracture  The various methods of treatment have been discussed with the patient and family. After consideration of risks, benefits and other options for treatment, the patient has consented to  Procedure(s): CANNULATED HIP PINNING (Left) as a surgical intervention .  The patient's history has been reviewed, patient examined, no change in status, stable for surgery.  I have reviewed the patient's chart and labs.  Questions were answered to the patient's satisfaction.     Mauri Pole

## 2018-03-10 NOTE — Anesthesia Preprocedure Evaluation (Signed)
Anesthesia Evaluation  Patient identified by MRN, date of birth, ID band Patient awake    Reviewed: Allergy & Precautions, NPO status , Patient's Chart, lab work & pertinent test results  Airway Mallampati: II  TM Distance: >3 FB Neck ROM: Full    Dental  (+) Edentulous Upper, Dental Advisory Given   Pulmonary former smoker,    breath sounds clear to auscultation       Cardiovascular  Rhythm:Regular Rate:Normal     Neuro/Psych    GI/Hepatic   Endo/Other  diabetes  Renal/GU      Musculoskeletal   Abdominal (+) + obese,   Peds  Hematology   Anesthesia Other Findings   Reproductive/Obstetrics                             Anesthesia Physical Anesthesia Plan  ASA: III  Anesthesia Plan: General   Post-op Pain Management:    Induction: Intravenous  PONV Risk Score and Plan: Ondansetron  Airway Management Planned: Oral ETT  Additional Equipment:   Intra-op Plan:   Post-operative Plan: Extubation in OR  Informed Consent: I have reviewed the patients History and Physical, chart, labs and discussed the procedure including the risks, benefits and alternatives for the proposed anesthesia with the patient or authorized representative who has indicated his/her understanding and acceptance.   Dental advisory given  Plan Discussed with: CRNA and Anesthesiologist  Anesthesia Plan Comments:         Anesthesia Quick Evaluation

## 2018-03-10 NOTE — Transfer of Care (Signed)
Immediate Anesthesia Transfer of Care Note  Patient: Morgan Schmidt  Procedure(s) Performed: CANNULATED HIP PINNING (Left Hip)  Patient Location: PACU  Anesthesia Type:General  Level of Consciousness: awake, alert , oriented and patient cooperative  Airway & Oxygen Therapy: Patient Spontanous Breathing and Patient connected to nasal cannula oxygen  Post-op Assessment: Report given to RN and Post -op Vital signs reviewed and stable  Post vital signs: Reviewed and stable  Last Vitals:  Vitals Value Taken Time  BP 147/57 03/10/2018  8:00 PM  Temp 36.9 C 03/10/2018  8:00 PM  Pulse 90 03/10/2018  8:11 PM  Resp 16 03/10/2018  8:11 PM  SpO2 92 % 03/10/2018  8:11 PM  Vitals shown include unvalidated device data.  Last Pain:  Vitals:   03/10/18 2000  TempSrc:   PainSc: 4       Patients Stated Pain Goal: 3 (65/68/12 7517)  Complications: No apparent anesthesia complications

## 2018-03-10 NOTE — Progress Notes (Signed)
     Subjective: Acute subcapital fracture of the left femoral neck with mild impaction    Patient reports pain as mild, pain minimal when sitting in bed.  Pain significantly increases with attempts of motion.  We have discussed the risks, benefits and expectations of surgery and not proceeding with surgery.  She understands the risks, benefits and expectation and wishes to proceed with ORIF of the left hip.    Objective:   VITALS:   Vitals:   03/09/18 1954 03/10/18 0358  BP: (!) 143/67 (!) 137/57  Pulse: 83 80  Resp: 16 15  Temp: 99 F (37.2 C) 99.3 F (37.4 C)  SpO2: 98% 93%    Neurovascular intact Dorsiflexion/Plantar flexion intact No cellulitis present Compartment soft  LABS Recent Labs    03/09/18 0512 03/10/18 0340  HGB 12.4 11.8*  HCT 38.3 36.4  WBC 7.6 6.8  PLT 212 193    Recent Labs    03/09/18 0512 03/10/18 0340  NA 134* 134*  K 3.7 4.0  BUN 11 9  CREATININE 0.69 0.68  GLUCOSE 265* 218*     Assessment/Plan: Acute subcapital fracture of the left femoral neck with mild impaction     Plan for surgery today, per Dr. Alvan Dame  Clear liquid diet until 0900  NPO after 0900  Plan for percutaneous screw fixation, consent order placed  Risks, benefits and expectations were discussed with the patient.  Risks including but not limited to the risk of anesthesia, blood clots, nerve damage, blood vessel damage, failure of the prosthesis, infection and up to and including death.  Patient understand the risks, benefits and expectations and wishes to proceed with surgery.      Morgan Schmidt   PAC  03/10/2018, 7:24 AM

## 2018-03-10 NOTE — Brief Op Note (Signed)
03/10/2018  7:53 PM  PATIENT:  Morgan Schmidt  67 y.o. female  PRE-OPERATIVE DIAGNOSIS:  Left valgus impacted femoral neck fracture  POST-OPERATIVE DIAGNOSIS:  Left valgus impacted femoral neck fracture   PROCEDURE:  Procedure(s): CANNULATED HIP PINNING (Left)  SURGEON:  Surgeon(s) and Role:    Paralee Cancel, MD - Primary  PHYSICIAN ASSISTANT: none  ANESTHESIA:   general  EBL:  Minimal  BLOOD ADMINISTERED:none  DRAINS: none   LOCAL MEDICATIONS USED:  NONE  SPECIMEN:  No Specimen  DISPOSITION OF SPECIMEN:  N/A  COUNTS:  YES  TOURNIQUET:  * No tourniquets in log *  DICTATION: .Other Dictation: Dictation Number [id number]  PLAN OF CARE: Admit to inpatient   PATIENT DISPOSITION:  PACU - hemodynamically stable.   Delay start of Pharmacological VTE agent (>24hrs) due to surgical blood loss or risk of bleeding: no

## 2018-03-10 NOTE — Anesthesia Procedure Notes (Signed)
Procedure Name: Intubation Date/Time: 03/10/2018 6:43 PM Performed by: Oletta Lamas, CRNA Pre-anesthesia Checklist: Patient identified, Emergency Drugs available, Suction available and Patient being monitored Patient Re-evaluated:Patient Re-evaluated prior to induction Oxygen Delivery Method: Circle System Utilized Preoxygenation: Pre-oxygenation with 100% oxygen Induction Type: IV induction and Cricoid Pressure applied Ventilation: Mask ventilation without difficulty and Oral airway inserted - appropriate to patient size Laryngoscope Size: Sabra Heck and 2 Grade View: Grade I Tube type: Oral Tube size: 7.0 mm Number of attempts: 1 Airway Equipment and Method: Stylet and Oral airway Placement Confirmation: ETT inserted through vocal cords under direct vision,  positive ETCO2 and breath sounds checked- equal and bilateral Secured at: 21 cm Tube secured with: Tape Dental Injury: Teeth and Oropharynx as per pre-operative assessment

## 2018-03-10 NOTE — H&P (View-Only) (Signed)
     Subjective: Acute subcapital fracture of the left femoral neck with mild impaction    Patient reports pain as mild, pain minimal when sitting in bed.  Pain significantly increases with attempts of motion.  We have discussed the risks, benefits and expectations of surgery and not proceeding with surgery.  She understands the risks, benefits and expectation and wishes to proceed with ORIF of the left hip.    Objective:   VITALS:   Vitals:   03/09/18 1954 03/10/18 0358  BP: (!) 143/67 (!) 137/57  Pulse: 83 80  Resp: 16 15  Temp: 99 F (37.2 C) 99.3 F (37.4 C)  SpO2: 98% 93%    Neurovascular intact Dorsiflexion/Plantar flexion intact No cellulitis present Compartment soft  LABS Recent Labs    03/09/18 0512 03/10/18 0340  HGB 12.4 11.8*  HCT 38.3 36.4  WBC 7.6 6.8  PLT 212 193    Recent Labs    03/09/18 0512 03/10/18 0340  NA 134* 134*  K 3.7 4.0  BUN 11 9  CREATININE 0.69 0.68  GLUCOSE 265* 218*     Assessment/Plan: Acute subcapital fracture of the left femoral neck with mild impaction     Plan for surgery today, per Dr. Alvan Dame  Clear liquid diet until 0900  NPO after 0900  Plan for percutaneous screw fixation, consent order placed  Risks, benefits and expectations were discussed with the patient.  Risks including but not limited to the risk of anesthesia, blood clots, nerve damage, blood vessel damage, failure of the prosthesis, infection and up to and including death.  Patient understand the risks, benefits and expectations and wishes to proceed with surgery.      Morgan Schmidt   PAC  03/10/2018, 7:24 AM

## 2018-03-10 NOTE — Progress Notes (Signed)
PROGRESS NOTE    Morgan Schmidt  MHD:[id number] DOB: [birth year] DOA: 03/08/2018 PCP: Morgan Fraise, MD      Brief Narrative:  Morgan Schmidt is a 67 y.o. F with DM, HTN, and hypothyroidism who was at her grandson's wedding, lining up for a photo, lost her balance or slipped on some wood chips, had a mechanical fall on concrete, and had immediate LEFT hip pain.  Found in ER to have a femoral neck fracture.       Assessment & Plan:  LEFT femoral neck fracuture -Pain control per Ortho -DVT prophylaxis per Ortho -To the OR today -If no contraindications, recommend vitamin D and calcium supplementation at discharge -Recommend DEXA scan if not previously done   Diabetes -Continue glargine -Scheduled SSI every 4 until after surgery then with meals  Hypertension -Resume irbesartan postoperatively  Hypothyroidism -Continue levothyroxine   Hyponatremia Mild -Trend BMP post-op  Anemia Normocytic, mild.   -Trend Hgb post-op       DVT prophylaxis: SCDs Code Status: FULL Family Communication: DAUGHTER at bedside MDM and disposition Plan: The below labs and imaging reports were reviewed and summarized above.    The patient was admitted with left hip fracture.  To OR today.   Consultants:   Orthopedics  Procedures:   None  Antimicrobials:   None    Subjective: Feeling well, left hip pain mild.  Has no chest pain, palpitaations, dyspnea on exertion, no confusion, no orthopnea, no leg swelling.  Objective: Vitals:   03/09/18 0700 03/09/18 1520 03/09/18 1954 03/10/18 0358  BP: 136/66 (!) 146/71 (!) 143/67 (!) 137/57  Pulse:  76 83 80  Resp:  16 16 15   Temp: (!) 97 F (36.1 C) 98.6 F (37 C) 99 F (37.2 C) 99.3 F (37.4 C)  TempSrc:  Oral Oral Oral  SpO2: 97% 95% 98% 93%  Weight:      Height:        Intake/Output Summary (Last 24 hours) at 03/10/2018 1432 Last data filed at 03/10/2018 0359 Gross per 24 hour  Intake 360 ml  Output 500 ml  Net  -140 ml   Filed Weights   03/09/18 0315  Weight: 70.3 kg (155 lb)    Examination: General appearance: Obese adult female, alert and in no acute distress.   HEENT: Anicteric, conjunctiva pink, lids and lashes normal. No nasal deformity, discharge, epistaxis.  Lips moist.   Skin: Warm and dry.  no jaundice.  No suspicious rashes or lesions. Cardiac: RRR, nl S1-S2, no murmurs appreciated.  Capillary refill is brisk.  JVP not visible.  No LE edema.  Radia  pulses 2+ and symmetric. Respiratory: Normal respiratory rate and rhythm.  CTAB without rales or wheezes. Abdomen: Abdomen soft.  no TTP. No ascites, distension, hepatosplenomegaly.   MSK: No deformities or effusions, except the left leg in foreshortened and externally rotated Neuro: Awake and alert.  EOMI, moves all extremities. Speech fluent.    Psych: Sensorium intact and responding to questions, attention normal. Affect normal.  Judgment and insight appear normal.    Data Reviewed: I have personally reviewed following labs and imaging studies:  CBC: Recent Labs  Lab 03/09/18 0512 03/10/18 0340  WBC 7.6 6.8  HGB 12.4 11.8*  HCT 38.3 36.4  MCV 84.4 84.5  PLT 212 211   Basic Metabolic Panel: Recent Labs  Lab 03/09/18 0512 03/10/18 0340  NA 134* 134*  K 3.7 4.0  CL 100 100  CO2 23 23  GLUCOSE 265* 218*  BUN 11 9  CREATININE 0.69 0.68  CALCIUM 8.5* 7.8*   GFR: Estimated Creatinine Clearance: 59.8 mL/min (by C-G formula based on SCr of 0.68 mg/dL). Liver Function Tests: No results for input(s): AST, ALT, ALKPHOS, BILITOT, PROT, ALBUMIN in the last 168 hours. No results for input(s): LIPASE, AMYLASE in the last 168 hours. No results for input(s): AMMONIA in the last 168 hours. Coagulation Profile: Recent Labs  Lab 03/09/18 0512  INR 1.02   Cardiac Enzymes: No results for input(s): CKTOTAL, CKMB, CKMBINDEX, TROPONINI in the last 168 hours. BNP (last 3 results) No results for input(s): PROBNP in the last 8760  hours. HbA1C: No results for input(s): HGBA1C in the last 72 hours. CBG: Recent Labs  Lab 03/09/18 1616 03/09/18 2131 03/10/18 0700 03/10/18 0822 03/10/18 1153  GLUCAP 240* 210* 192* 191* 140*   Lipid Profile: No results for input(s): CHOL, HDL, LDLCALC, TRIG, CHOLHDL, LDLDIRECT in the last 72 hours. Thyroid Function Tests: No results for input(s): TSH, T4TOTAL, FREET4, T3FREE, THYROIDAB in the last 72 hours. Anemia Panel: No results for input(s): VITAMINB12, FOLATE, FERRITIN, TIBC, IRON, RETICCTPCT in the last 72 hours. Urine analysis:    Component Value Date/Time   COLORURINE YELLOW 03/09/2018 1837   APPEARANCEUR CLEAR 03/09/2018 1837   APPEARANCEUR Clear 02/08/2017 1049   LABSPEC 1.015 03/09/2018 1837   PHURINE 5.0 03/09/2018 1837   GLUCOSEU >=500 (A) 03/09/2018 1837   HGBUR NEGATIVE 03/09/2018 1837   BILIRUBINUR NEGATIVE 03/09/2018 1837   BILIRUBINUR Negative 02/08/2017 1049   KETONESUR NEGATIVE 03/09/2018 1837   PROTEINUR NEGATIVE 03/09/2018 1837   UROBILINOGEN negative 09/23/2015 1056   UROBILINOGEN 2.0 (H) 06/30/2008 0645   NITRITE NEGATIVE 03/09/2018 1837   LEUKOCYTESUR TRACE (A) 03/09/2018 1837   LEUKOCYTESUR Trace (A) 02/08/2017 1049   Sepsis Labs: @LABRCNTIP (procalcitonin:4,lacticacidven:4)  ) Recent Results (from the past 240 hour(s))  Surgical pcr screen     Status: Abnormal   Collection Time: 03/10/18  2:18 AM  Result Value Ref Range Status   MRSA, PCR POSITIVE (A) NEGATIVE Final    Comment: RESULT CALLED TO, READ BACK BY AND VERIFIED WITH: Morgan Schmidt 0429 03/10/18 Morgan Schmidt    Staphylococcus aureus POSITIVE (A) NEGATIVE Final    Comment: (NOTE) The Xpert SA Assay (FDA approved for NASAL specimens in patients 47 years of age and older), is one component of a comprehensive surveillance program. It is not intended to diagnose infection nor to guide or monitor treatment.          Radiology Studies: Dg Ankle Complete Left  Result Date:  03/09/2018 CLINICAL DATA:  Initial evaluation for acute trauma, fall. EXAM: LEFT ANKLE COMPLETE - 3+ VIEW COMPARISON:  None. FINDINGS: No acute fracture or dislocation. Ankle mortise approximated. Talar dome intact. No acute soft tissue abnormality. Posterior and plantar calcaneal enthesophytes noted. IMPRESSION: No acute osseous abnormality about the left ankle. Electronically Signed   By: Jeannine Boga M.D.   On: 03/09/2018 04:33   Dg Chest Port 1 View  Result Date: 03/09/2018 CLINICAL DATA:  Preoperative evaluation for left hip fracture. EXAM: PORTABLE CHEST 1 VIEW COMPARISON:  Prior radiograph from 04/19/2010 FINDINGS: Mild cardiomegaly, stable. Mediastinal silhouette normal. Mild aortic atherosclerosis. Lungs are hypoinflated. No focal infiltrates. No pulmonary edema or pleural effusion. No pneumothorax. No acute osseous abnormality. IMPRESSION: 1. No active cardiopulmonary disease. 2. Mild cardiomegaly, stable. Electronically Signed   By: Jeannine Boga M.D.   On: 03/09/2018 07:21   Dg Knee Complete 4 Views Left  Result Date: 03/09/2018  CLINICAL DATA:  Initial evaluation for acute fall, pain. EXAM: LEFT KNEE - COMPLETE 4+ VIEW COMPARISON:  Prior radiograph from 09/22/2016 FINDINGS: No acute fracture or dislocation. No joint effusion. Enthesophyte at the superior pole the patella. Mild degenerative osteoarthritic changes about the knee. Osseous mineralization normal. No soft tissue abnormality. IMPRESSION: No acute osseous abnormality about the knee. Electronically Signed   By: Jeannine Boga M.D.   On: 03/09/2018 04:34   Dg Hip Unilat W Or Wo Pelvis 2-3 Views Left  Result Date: 03/09/2018 CLINICAL DATA:  Initial evaluation for acute trauma, fall. EXAM: DG HIP (WITH OR WITHOUT PELVIS) 2-3V LEFT COMPARISON:  None. FINDINGS: Acute subcapital fracture of the left femoral neck with slight impaction. Femoral head remains in normal alignment within the acetabulum. Bony pelvis intact. SI  joints approximated. No acute abnormality about the right hip. Soft tissues within normal limits. IMPRESSION: Acute subcapital fracture of the left femoral neck with mild impaction. Electronically Signed   By: Jeannine Boga M.D.   On: 03/09/2018 04:32        Scheduled Meds: . Chlorhexidine Gluconate Cloth  6 each Topical Q0600  . docusate sodium  100 mg Oral BID  . insulin aspart  0-15 Units Subcutaneous Q4H  . insulin glargine  15 Units Subcutaneous QHS  . [START ON 03/11/2018] irbesartan  150 mg Oral Daily  . levothyroxine  125 mcg Oral QAC breakfast  . mupirocin ointment  1 application Nasal BID   Continuous Infusions: .  ceFAZolin (ANCEF) IV    . methocarbamol (ROBAXIN)  IV       LOS: 1 day    Time spent: 25 minutes    Edwin Dada, MD [facility] 03/10/2018, 2:32 PM     Pager [phone] --- please page though AMION:  [email or web address] Password [email or web address] If 7PM-7AM, please contact night-coverage

## 2018-03-11 ENCOUNTER — Encounter (HOSPITAL_COMMUNITY): Payer: Self-pay | Admitting: Orthopedic Surgery

## 2018-03-11 LAB — BASIC METABOLIC PANEL
Anion gap: 14 (ref 5–15)
BUN: 12 mg/dL (ref 8–23)
CHLORIDE: 99 mmol/L (ref 98–111)
CO2: 22 mmol/L (ref 22–32)
Calcium: 7.7 mg/dL — ABNORMAL LOW (ref 8.9–10.3)
Creatinine, Ser: 0.74 mg/dL (ref 0.44–1.00)
GFR calc Af Amer: >60 mL/min (ref >60)
GFR calc non Af Amer: >60 mL/min (ref >60)
GLUCOSE: 272 mg/dL — AB (ref 70–99)
POTASSIUM: 4.1 mmol/L (ref 3.5–5.1)
Sodium: 135 mmol/L (ref 135–145)

## 2018-03-11 LAB — CBC
HEMATOCRIT: 37 % (ref 36.0–46.0)
Hemoglobin: 12.2 g/dL (ref 12.0–15.0)
MCH: 27.6 pg (ref 26.0–34.0)
MCHC: 33 g/dL (ref 30.0–36.0)
MCV: 83.7 fL (ref 78.0–100.0)
Platelets: 214 10*3/uL (ref 150–400)
RBC: 4.42 MIL/uL (ref 3.87–5.11)
RDW: 13.4 % (ref 11.5–15.5)
WBC: 8.2 10*3/uL (ref 4.0–10.5)

## 2018-03-11 LAB — GLUCOSE, CAPILLARY
GLUCOSE-CAPILLARY: 290 mg/dL — AB (ref 70–99)
GLUCOSE-CAPILLARY: 342 mg/dL — AB (ref 70–99)
GLUCOSE-CAPILLARY: 247 mg/dL — AB (ref 70–99)
GLUCOSE-CAPILLARY: 282 mg/dL — AB (ref 70–99)
GLUCOSE-CAPILLARY: 321 mg/dL — AB (ref 70–99)

## 2018-03-11 MED ORDER — INSULIN GLARGINE 100 UNIT/ML ~~LOC~~ SOLN
18.0000 [IU] | Freq: Every day | SUBCUTANEOUS | Status: DC
  Administered 2018-03-11: 18 [IU] via SUBCUTANEOUS
  Filled 2018-03-11: qty 0.18

## 2018-03-11 MED ORDER — INSULIN ASPART 100 UNIT/ML ~~LOC~~ SOLN
0.0000 [IU] | Freq: Three times a day (TID) | SUBCUTANEOUS | Status: DC
  Administered 2018-03-11: 7 [IU] via SUBCUTANEOUS
  Administered 2018-03-11: 11 [IU] via SUBCUTANEOUS
  Administered 2018-03-12: 7 [IU] via SUBCUTANEOUS
  Administered 2018-03-12: 15 [IU] via SUBCUTANEOUS

## 2018-03-11 MED ORDER — INSULIN ASPART 100 UNIT/ML ~~LOC~~ SOLN
0.0000 [IU] | Freq: Every day | SUBCUTANEOUS | Status: DC
  Administered 2018-03-11: 4 [IU] via SUBCUTANEOUS

## 2018-03-11 NOTE — Progress Notes (Signed)
[name], female   DOB: 1950/09/12, 67 y.o.   MRN: [id number] Subjective: 1 Day Post-Op Procedure(s) (LRB): CANNULATED HIP PINNING (Left)    Patient reports pain as mild.  Comfortable this am. No events  Objective:   VITALS:   Vitals:   03/10/18 2100 03/11/18 0442  BP: (!) 143/61 140/65  Pulse: 84 73  Resp: 20   Temp: 97.7 F (36.5 C) 97.8 F (36.6 C)  SpO2: 96% 98%    Neurovascular intact Incision: dressing C/D/I  LABS Recent Labs    03/09/18 0512 03/10/18 0340 03/11/18 0432  HGB 12.4 11.8* 12.2  HCT 38.3 36.4 37.0  WBC 7.6 6.8 8.2  PLT 212 193 214    Recent Labs    03/09/18 0512 03/10/18 0340 03/11/18 0432  NA 134* 134* 135  K 3.7 4.0 4.1  BUN 11 9 12   CREATININE 0.69 0.68 0.74  GLUCOSE 265* 218* 272*    Recent Labs    03/09/18 0512  INR 1.02     Assessment/Plan: 1 Day Post-Op Procedure(s) (LRB): CANNULATED HIP PINNING (Left)   Advance diet Up with therapy Plan for discharge tomorrow - likely if does well with therapy PWB LLE Aspirin for DVT prophylaxis

## 2018-03-11 NOTE — Plan of Care (Signed)

## 2018-03-11 NOTE — Evaluation (Signed)
Physical Therapy Evaluation Patient Details Name: Morgan Schmidt MRN: [id number] DOB: 04/28/1951 Today's Date: 03/11/2018   History of Present Illness  Morgan Schmidt is a 67 y.o. F with a L Femoral Neck Fx after a fall, s/p percutaneous cannulated screw fixation. PMH includes T2DM, HTN, Hypothyroidism,   Clinical Impression  Pt presents with problems above and deficits below. Pt was educated on [facility] precautions. Pt performed HEP, sit<>stand transfers, and ambulated in the room with RW and MinG-MinA. Pt had no visible LOB and followed PWB precautions. Expect pt to progress well and will be able to progress to home with Home Health PT and assist from daughter. Will continue to follow acutely to support independence, mobility, and safety.     Follow Up Recommendations Home health PT    Equipment Recommendations  3in1 (PT);Rolling walker with 5" wheels([employer] Walker)    Recommendations for Other Services OT consult     Precautions / Restrictions Precautions Precautions: Fall Restrictions Weight Bearing Restrictions: Yes LLE Weight Bearing: Partial weight bearing LLE Partial Weight Bearing Percentage or Pounds: 50%      Mobility  Bed Mobility Overal bed mobility: Needs Assistance Bed Mobility: Supine to Sit     Supine to sit: HOB elevated;Min guard;Min assist     General bed mobility comments: Pt required increased time to move to EOB with MinG, and used MinA HH grip to fully sit up and scoot hips to EOB. Pt coughed at EOB and reported some minor incontinence as a result of coughing.   Transfers Overall transfer level: Needs assistance Equipment used: Rolling walker (2 wheeled) Transfers: Sit to/from Stand Sit to Stand: Min assist;Min guard         General transfer comment: Pt required MinA for intitial power up of transfer, and MinG as trunk straightened and pt assumed upright posture. Pt reported some dizziness upon standing, which then resolved.    Ambulation/Gait Ambulation/Gait assistance: Min guard;Min assist Gait Distance (Feet): 20 Feet(To curtain and back from opposite edge of bed) Assistive device: Rolling walker (2 wheeled) Gait Pattern/deviations: Step-to pattern;Decreased stance time - left;Decreased weight shift to left;Antalgic;Narrow base of support;Decreased stride length;Decreased step length - right;Decreased step length - left Gait velocity: Decreased   General Gait Details: Pt reported an increased in pain with ambulation. Pt ambualted with RW and MinG-MinA. Pt required MinA as pt fatigued, but ambulated with upright posture and followed PWB precautions. Pt followed cues for appropriate gait pattern and safe use of RW. Pt had no visible LOB during ambulation.   Stairs            Wheelchair Mobility    Modified Rankin (Stroke Patients Only)       Balance Overall balance assessment: Needs assistance Sitting-balance support: Feet unsupported;No upper extremity supported Sitting balance-Leahy Scale: Fair Sitting balance - Comments: Pt was able to raise arms overhead while PT put on gait belt   Standing balance support: Bilateral upper extremity supported Standing balance-Leahy Scale: Poor Standing balance comment: Pt was able stand with MinG/MinA and RW, and had no visible LOB.                             Pertinent Vitals/Pain Pain Assessment: 0-10 Pain Score: 4  Pain Location: L Hip Pain Descriptors / Indicators: Grimacing;Guarding Pain Intervention(s): Limited activity within patient's tolerance;Monitored during session;Repositioned    Home Living Family/patient expects to be discharged to:: Private residence Living Arrangements: Children(Daughter and granddaughter) Available Help  at Discharge: Family;Available 24 hours/day Type of Home: House Home Access: Stairs to enter Entrance Stairs-Rails: None Entrance Stairs-Number of Steps: 1(Porch step) Home Layout: Two level;Able to live  on main level with bedroom/bathroom(Reports she can sleep on couch as needed downstairs) Home Equipment: None      Prior Function Level of Independence: Independent         Comments: Pt reports prior to fall at her grandson's wedding, 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 Pt reported normal sensation, but upon ambulating, reported that her L leg felt a little like "jello" to stand on. Pt was able to perform TherEx below, but had limited AROM in hip and knee flexion. LLE: Unable to fully assess due to pain(Limited ROM) LLE Sensation: WNL(Pt reports normal sensation)    Cervical / Trunk Assessment Cervical / Trunk Assessment: Normal  Communication   Communication: No difficulties  Cognition Arousal/Alertness: Awake/alert Behavior During Therapy: WFL for tasks assessed/performed Overall Cognitive Status: Within Functional Limits for tasks assessed                                 General Comments: Pt was alert, attentive to details, responsive to cues, and mindful of safety.       General Comments General comments (skin integrity, edema, etc.): Pt reported that moving felt "good" and that she was glad to be up and about.     Exercises General Exercises - Lower Extremity Ankle Circles/Pumps: AROM;Both;20 reps;Supine Quad Sets: AROM;10 reps;Supine;Left Long Arc Quad: AROM;Both;5 reps;Seated Heel Slides: AROM;Left;10 reps;Supine   Assessment/Plan    PT Assessment Patient needs continued PT services  PT Problem List Decreased strength;Decreased range of motion;Decreased activity tolerance;Decreased mobility;Decreased knowledge of  use of DME;Decreased knowledge of precautions;Pain       PT Treatment Interventions DME instruction;Gait training;Functional mobility training;Stair training;Therapeutic activities;Therapeutic exercise;Patient/family education    PT Goals (Current goals can be found in the Care Plan section)  Acute Rehab PT Goals Patient Stated Goal: To go home with her daughter PT Goal Formulation: With patient Time For Goal Achievement: 03/25/18 Potential to Achieve Goals: Good    Frequency Min 5X/week   Barriers to discharge        Co-evaluation               AM-PAC PT "6 Clicks" Daily Activity  Outcome Measure Difficulty turning over in bed (including adjusting bedclothes, sheets and blankets)?: Unable Difficulty moving from lying on back to sitting on the side of the bed? : Unable Difficulty sitting down on and standing up from a chair with arms (e.g., wheelchair, bedside commode, etc,.)?: Unable Help needed moving to and from a bed to chair (including a wheelchair)?: A Little Help needed walking in hospital room?: A Little Help needed climbing 3-5 steps with a railing? : A Lot 6 Click Score: 11    End of Session Equipment Utilized During Treatment: Gait belt Activity Tolerance: Patient limited by fatigue;Patient limited by pain Patient left: in chair;with call bell/phone within reach;with chair alarm set Nurse Communication: Mobility status PT Visit Diagnosis: Other abnormalities of gait and mobility (R26.89);Muscle  weakness (generalized) (M62.81);History of falling (Z91.81);Difficulty in walking, not elsewhere classified (R26.2);Pain Pain - Right/Left: Left Pain - part of body: Hip    Time: 1208-1243 PT Time Calculation (min) (ACUTE ONLY): 35 min   Charges:   PT Evaluation $PT Eval Low Complexity: 1 Low PT Treatments $Gait Training: 8-22 mins   PT G Codes:        Elwin Mocha, S-DPT Acute Care Rehab Student [phone]   03/11/2018, 3:35 PM

## 2018-03-11 NOTE — Op Note (Signed)
NAME: Morgan Schmidt, SHORT MEDICAL RECORD [id number] ACCOUNT [id number] DATE OF BIRTH:1950-11-24 FACILITY: [facility] LOCATION: [facility] PHYSICIAN:Jinelle Butchko Marian Sorrow, MD  OPERATIVE REPORT  DATE OF PROCEDURE:  03/10/2018  PREOPERATIVE DIAGNOSIS:  Nondisplaced valgus impacted left femoral neck fracture.  POSTOPERATIVE DIAGNOSIS:  Nondisplaced valgus impacted left femoral neck fracture.  PROCEDURE:  Closed reduction and percutaneous cannulated screw fixation utilizing Biomet 8.0 mm cannulated screws with an 80 mm screw inferiorly, 75 posterior, 70 anterior proximal.  SURGEON:  Paralee Cancel, MD.  ASSISTANT:  Surgical team.  ANESTHESIA:  General.  SPECIMENS:  None.  COMPLICATIONS:  None.  ESTIMATED BLOOD LOSS:  Minimal.  INDICATIONS:  The patient is a pleasant 67 year old female who unfortunately had a fall in preparation a family wedding.  She had immediate onset of pain; however, she sat through the wedding for a 4-1/2 hours before coming to the emergency room.   Radiographs revealed a nondisplaced femoral neck fracture.  She was admitted to the hospitalist service per routine.  Orthopedics was consulted.  I reviewed her fracture pattern both AP and lateral planes and she seemed to have a stable fracture pattern  in this left femoral neck so that would be amenable to cannulated screw fixation.  I reviewed the risks and benefits, pros and cons of doing this versus hemi or total hip arthroplasty.  Consent was obtained.  Risks including infection, malunion,  nonunion, need for surgery were discussed for benefit of joint preservation.  Consent was obtained.  DESCRIPTION OF PROCEDURE:  The patient was brought to the operative theater.  Once adequate anesthesia, preoperative antibiotics, Ancef administered, she was positioned on the fracture table with perineal post and all bony prominences adequately padded  and positioned appropriately.  The left lower extremity was placed in a traction boot.  The  right lower extremity was placed into the well leg holder with flexion and abduction.  Again, lateral fibula area was padded.  Fluoroscopy was brought to the field to confirm maintenance of the fracture reduction.  Once this was done, the left hip was prepped and draped in sterile fashion using shower curtain technique.  Once this was done, fluoroscopy was brought back to the  field identifying landmarks.  An incision was then made lateral to the level of the lesser trochanter.  Guidewire was first centered in the center of the femoral neck in the inferior aspect of the neck.  This was confirmed radiographically.  While doing  so 2 other guide pins were placed proximal, 1 posterior and 1 anterior.  Once I was satisfied with the orientation and position, we measured and selected screw lengths.  Traction was let off.  I then placed three 8.0 mm cannulated screws with the  shoulder cannulated thread, distance  based on the subcapital nature of the fracture to allow for compression.  The screws were placed in the order of an 80 mm inferior screw, 75 mm posterior and a 70 mm anterior screw.  These were all compressed against  their lateral cortex with good stability.  Final radiographs were obtained in AP and lateral planes.  The wound was irrigated.  I closed the iliotibial band with #1 Vicryl.  The remaining wound was closed with 2-0 Vicryl and a running Monocryl stitch.   The hip was then cleaned, dried and dressed sterilely using surgical glue and Aquacel dressing.  She was then brought to the recovery room in stable condition, extubated, tolerated the procedure well.  Postoperatively, she will be partial weightbearing.   She will  resume aspirin therapy.  We will see her back in the office in 2 weeks for initial radiographs and follow until union.  TN/NUANCE  D:03/10/2018 T:03/11/2018 JOB:[id number]

## 2018-03-11 NOTE — Progress Notes (Signed)
Inpatient Diabetes Program Recommendations  AACE/ADA: New Consensus Statement on Inpatient Glycemic Control (2015)  Target Ranges:  Prepandial:   less than 140 mg/dL      Peak postprandial:   less than 180 mg/dL (1-2 hours)      Critically ill patients:  140 - 180 mg/dL   Lab Results  Component Value Date   GLUCAP 247 (H) 03/11/2018   HGBA1C 7.6 (H) 10/18/2016    Review of Glycemic Control  Diabetes history: DM2 Outpatient Diabetes medications: Trulicity 1.5 mg Q weekly, Lantus 110 units after breakfast, metformin 1000 mg bid Current orders for Inpatient glycemic control: Lantus 18 units QHS, Novolog 0-20 units tidwc and hs  HgbA1C 7.6%.   Inpatient Diabetes Program Recommendations:     Increase Lantus to 40 units QHS  Continue to follow.  Thank you. Lorenda Peck, RD, LDN, CDE Inpatient Diabetes Coordinator [phone]

## 2018-03-11 NOTE — Progress Notes (Signed)
PROGRESS NOTE    Morgan Schmidt  EPP:[id number] DOB: 1951/02/23 DOA: 03/08/2018 PCP: Morgan Fraise, MD      Brief Narrative:  Morgan Schmidt is a 67 y.o. F with DM, HTN, and hypothyroidism who was at her grandson's wedding, lining up for a photo, lost her balance or slipped on some wood chips, had a mechanical fall on concrete, and had immediate LEFT hip pain.  Found in ER to have a femoral neck fracture.       Assessment & Plan:  LEFT femoral neck fracuture S/p Closed reduction and percutaneous cannulated screw fixation on 7/22 -Pain control per Ortho -DVT prophylaxis per Ortho: Aspirin -If no contraindications, recommend vitamin D and calcium supplementation at discharge -Recommend DEXA scan if not previously done   Diabetes Hyperglycemic overnight -Increase glargine again -Continue SSI, increase dose  Hypertension -Continue Irbesartan  Hypothyroidism -Continue levothyroxine   Hyponatremia Resolved  Anemia Normocytic, mild.   No Hgb drop.         DVT prophylaxis: SCDs Code Status: FULL Family Communication: None present MDM and disposition Plan: The below labs and imaging reports were reviewed and summarized above.  Presented with left hip fracture, went to the OR 6/06, uncomplicated surgery, stable post-op course.  Agree, likely home with [facility] or to SNF tomorrow.  PT pending.   Consultants:   Orthopedics  Procedures:   None  Antimicrobials:   None    Subjective: Feeling wel.  Pain much better today.  No chest pain, dyspnea, confusion.  No dysuria, fever.  No blurry vision, polyuria.       Objective: Vitals:   03/10/18 2000 03/10/18 2015 03/10/18 2100 03/11/18 0442  BP: (!) 147/57 (!) 131/57 (!) 143/61 140/65  Pulse: 95 87 84 73  Resp: 18 (!) 21 20   Temp: 98.4 F (36.9 C)  97.7 F (36.5 C) 97.8 F (36.6 C)  TempSrc:   Oral Oral  SpO2: 91% 95% 96% 98%  Weight:      Height:        Intake/Output Summary (Last 24 hours) at  03/11/2018 3016 Last data filed at 03/11/2018 0902 Gross per 24 hour  Intake 840 ml  Output 1100 ml  Net -260 ml   Filed Weights   03/09/18 0315  Weight: 70.3 kg (155 lb)    Examination: General appearance: Obese adult female, lying in bed, no acute distress, interactive. HEENT: Anicteric, conjunctiva pink, lids and lashes normal. No nasal deformity, discharge, epistaxis.  Lips moist.   Skin: Warm and dry.  no jaundice.  No suspicious rashes or lesions. Cardiac: RRR, no murmurs, no LE edema Respiratory: Normal RR and rhythm.  No rales or wheezes. Abdomen: Abdomen soft without tenderness to palpation or masses MSK:  Neuro: Awake and alert.  EOMI, visual stracking smooth, moves extremities with symmetric strength.  Speech fluent. Psych: Sensorium intact and responding to qusestions.  Attention normal.  Judgment and insight normal.    Data Reviewed: I have personally reviewed following labs and imaging studies:  CBC: Recent Labs  Lab 03/09/18 0512 03/10/18 0340 03/11/18 0432  WBC 7.6 6.8 8.2  HGB 12.4 11.8* 12.2  HCT 38.3 36.4 37.0  MCV 84.4 84.5 83.7  PLT 212 193 010   Basic Metabolic Panel: Recent Labs  Lab 03/09/18 0512 03/10/18 0340 03/11/18 0432  NA 134* 134* 135  K 3.7 4.0 4.1  CL 100 100 99  CO2 23 23 22   GLUCOSE 265* 218* 272*  BUN 11 9 12   CREATININE  0.69 0.68 0.74  CALCIUM 8.5* 7.8* 7.7*   GFR: Estimated Creatinine Clearance: 59.8 mL/min (by C-G formula based on SCr of 0.74 mg/dL). Liver Function Tests: No results for input(s): AST, ALT, ALKPHOS, BILITOT, PROT, ALBUMIN in the last 168 hours. No results for input(s): LIPASE, AMYLASE in the last 168 hours. No results for input(s): AMMONIA in the last 168 hours. Coagulation Profile: Recent Labs  Lab 03/09/18 0512  INR 1.02   Cardiac Enzymes: No results for input(s): CKTOTAL, CKMB, CKMBINDEX, TROPONINI in the last 168 hours. BNP (last 3 results) No results for input(s): PROBNP in the last 8760  hours. HbA1C: No results for input(s): HGBA1C in the last 72 hours. CBG: Recent Labs  Lab 03/10/18 2005 03/10/18 2219 03/10/18 2341 03/11/18 0440 03/11/18 0836  GLUCAP 140* 233* 253* 290* 342*   Lipid Profile: No results for input(s): CHOL, HDL, LDLCALC, TRIG, CHOLHDL, LDLDIRECT in the last 72 hours. Thyroid Function Tests: No results for input(s): TSH, T4TOTAL, FREET4, T3FREE, THYROIDAB in the last 72 hours. Anemia Panel: No results for input(s): VITAMINB12, FOLATE, FERRITIN, TIBC, IRON, RETICCTPCT in the last 72 hours. Urine analysis:    Component Value Date/Time   COLORURINE YELLOW 03/09/2018 1837   APPEARANCEUR CLEAR 03/09/2018 1837   APPEARANCEUR Clear 02/08/2017 1049   LABSPEC 1.015 03/09/2018 1837   PHURINE 5.0 03/09/2018 1837   GLUCOSEU >=500 (A) 03/09/2018 1837   HGBUR NEGATIVE 03/09/2018 1837   BILIRUBINUR NEGATIVE 03/09/2018 1837   BILIRUBINUR Negative 02/08/2017 1049   KETONESUR NEGATIVE 03/09/2018 1837   PROTEINUR NEGATIVE 03/09/2018 1837   UROBILINOGEN negative 09/23/2015 1056   UROBILINOGEN 2.0 (H) 06/30/2008 0645   NITRITE NEGATIVE 03/09/2018 1837   LEUKOCYTESUR TRACE (A) 03/09/2018 1837   LEUKOCYTESUR Trace (A) 02/08/2017 1049   Sepsis Labs: @LABRCNTIP (procalcitonin:4,lacticacidven:4)  ) Recent Results (from the past 240 hour(s))  Surgical pcr screen     Status: Abnormal   Collection Time: 03/10/18  2:18 AM  Result Value Ref Range Status   MRSA, PCR POSITIVE (A) NEGATIVE Final    Comment: RESULT CALLED TO, READ BACK BY AND VERIFIED WITH: HANDY,C RN 0429 03/10/18 MITCHELL,L    Staphylococcus aureus POSITIVE (A) NEGATIVE Final    Comment: (NOTE) The Xpert SA Assay (FDA approved for NASAL specimens in patients 79 years of age and older), is one component of a comprehensive surveillance program. It is not intended to diagnose infection nor to guide or monitor treatment.          Radiology Studies: Dg C-arm 1-60 Min  Result Date:  03/10/2018 CLINICAL DATA:  Left hip pinning EXAM: DG C-ARM 61-120 MIN; OPERATIVE LEFT HIP WITH PELVIS COMPARISON:  03/09/2018 FINDINGS: 2 fluoroscopic spot images document placement of 3 orthopedic screws across the left femoral neck fracture, fragments in near anatomic alignment. IMPRESSION: 1. Pinning across the left femoral neck as above. Electronically Signed   By: Lucrezia Europe M.D.   On: 03/10/2018 20:02   Dg Hip Operative Unilat W Or W/o Pelvis Left  Result Date: 03/10/2018 CLINICAL DATA:  Left hip pinning EXAM: DG C-ARM 61-120 MIN; OPERATIVE LEFT HIP WITH PELVIS COMPARISON:  03/09/2018 FINDINGS: 2 fluoroscopic spot images document placement of 3 orthopedic screws across the left femoral neck fracture, fragments in near anatomic alignment. IMPRESSION: 1. Pinning across the left femoral neck as above. Electronically Signed   By: Lucrezia Europe M.D.   On: 03/10/2018 20:02        Scheduled Meds: . aspirin EC  325 mg Oral BID  .  celecoxib  200 mg Oral BID  . Chlorhexidine Gluconate Cloth  6 each Topical Q0600  . docusate sodium  100 mg Oral BID  . ferrous sulfate  325 mg Oral TID PC  . insulin aspart  0-20 Units Subcutaneous TID WC  . insulin aspart  0-5 Units Subcutaneous QHS  . insulin glargine  18 Units Subcutaneous QHS  . irbesartan  150 mg Oral Daily  . levothyroxine  125 mcg Oral QAC breakfast  . mupirocin ointment  1 application Nasal BID   Continuous Infusions: . lactated ringers 10 mL/hr at 03/10/18 1750  . methocarbamol (ROBAXIN)  IV       LOS: 2 days    Time spent: 25 minutes    Edwin Dada, MD [facility] 03/11/2018, 9:27 AM     Pager [phone] --- please page though AMION:  [email or web address] If 7PM-7AM, please contact night-coverage

## 2018-03-12 LAB — CBC
HCT: 35.4 % — ABNORMAL LOW (ref 36.0–46.0)
Hemoglobin: 11.2 g/dL — ABNORMAL LOW (ref 12.0–15.0)
MCH: 27.5 pg (ref 26.0–34.0)
MCHC: 31.6 g/dL (ref 30.0–36.0)
MCV: 86.8 fL (ref 78.0–100.0)
PLATELETS: 222 10*3/uL (ref 150–400)
RBC: 4.08 MIL/uL (ref 3.87–5.11)
RDW: 13.9 % (ref 11.5–15.5)
WBC: 7.6 10*3/uL (ref 4.0–10.5)

## 2018-03-12 LAB — BASIC METABOLIC PANEL
Anion gap: 10 (ref 5–15)
BUN: 19 mg/dL (ref 8–23)
CALCIUM: 7.5 mg/dL — AB (ref 8.9–10.3)
CO2: 25 mmol/L (ref 22–32)
CREATININE: 0.76 mg/dL (ref 0.44–1.00)
Chloride: 101 mmol/L (ref 98–111)
GFR calc Af Amer: >60 mL/min (ref >60)
GFR calc non Af Amer: >60 mL/min (ref >60)
Glucose, Bld: 259 mg/dL — ABNORMAL HIGH (ref 70–99)
Potassium: 3.7 mmol/L (ref 3.5–5.1)
SODIUM: 136 mmol/L (ref 135–145)

## 2018-03-12 LAB — GLUCOSE, CAPILLARY
GLUCOSE-CAPILLARY: 243 mg/dL — AB (ref 70–99)
GLUCOSE-CAPILLARY: 340 mg/dL — AB (ref 70–99)

## 2018-03-12 MED ORDER — INSULIN GLARGINE 100 UNIT/ML ~~LOC~~ SOLN
15.0000 [IU] | Freq: Two times a day (BID) | SUBCUTANEOUS | Status: DC
  Filled 2018-03-12: qty 0.15

## 2018-03-12 MED ORDER — INSULIN GLARGINE 100 UNIT/ML ~~LOC~~ SOLN
40.0000 [IU] | Freq: Every day | SUBCUTANEOUS | Status: DC
  Filled 2018-03-12: qty 0.4

## 2018-03-12 MED FILL — METHOCARBAMOL 500 MG TABS: 500 | 10 days supply | Qty: 40 | Fill #0

## 2018-03-12 MED FILL — HYDROCODON-APAP 7.5-325: 7.5-325 | 5 days supply | Qty: 60 | Fill #0

## 2018-03-12 MED FILL — ASPIRIN CHILD 81 MG TAB CHE: 81 | 27 days supply | Qty: 54 | Fill #0

## 2018-03-12 NOTE — Anesthesia Postprocedure Evaluation (Signed)
Anesthesia Post Note  Patient: Morgan Schmidt  Procedure(s) Performed: CANNULATED HIP PINNING (Left Hip)     Patient location during evaluation: PACU Anesthesia Type: General Level of consciousness: awake and alert Pain management: pain level controlled Vital Signs Assessment: post-procedure vital signs reviewed and stable Respiratory status: spontaneous breathing, nonlabored ventilation, respiratory function stable and patient connected to nasal cannula oxygen Cardiovascular status: blood pressure returned to baseline and stable Postop Assessment: no apparent nausea or vomiting Anesthetic complications: no    Last Vitals:  Vitals:   03/11/18 2142 03/12/18 0700  BP: (!) 131/56 124/60  Pulse: 84 64  Resp: 15 15  Temp: 36.9 C 36.4 C  SpO2: 95% 93%    Last Pain:  Vitals:   03/12/18 0720  TempSrc:   PainSc: 7                  Refugia Laneve COKER

## 2018-03-12 NOTE — Progress Notes (Signed)
Patient discharging today. Discharge instructions explained to patient and she verbalized understanding. Took all personal belongings. No further questions or concerns voiced.

## 2018-03-12 NOTE — Discharge Summary (Signed)
Discharge Summary  Morgan Schmidt BJY:[id number] DOB: Jan 03, 1951  PCP: Claretta Fraise, MD  Admit date: 03/08/2018 Discharge date: 03/12/2018  Time spent: 35 mins   Recommendations for Outpatient Follow-up:  1. PCP in 1 week 2. Orthopedics  Discharge Diagnoses:  Active Hospital Problems   Diagnosis Date Noted  . Closed left hip fracture, initial encounter ([place]) 03/09/2018  . Essential hypertension 03/23/2016  . Mixed diabetic hyperlipidemia associated with type 2 diabetes mellitus ([place]) 12/16/2014  . Other specified hypothyroidism 12/16/2014    Resolved Hospital Problems  No resolved problems to display.    Discharge Condition: Stable  Diet recommendation: Heart healthy/ mod carb  Vitals:   03/11/18 2142 03/12/18 0700  BP: (!) 131/56 124/60  Pulse: 84 64  Resp: 15 15  Temp: 98.5 F (36.9 C) 97.6 F (36.4 C)  SpO2: 95% 93%    History of present illness:  Morgan Schmidt is a 67 y.o. F with DM, HTN, and hypothyroidism who was at her grandson's wedding, lining up for a photo, lost her balance or slipped on some wood chips, had a mechanical fall on concrete, and had immediate LEFT hip pain.  Found in ER to have a femoral neck fracture. Orthopedics consulted, had surgical repair on 7/22. Pt stable to be discharged home from ortho stand point.  Today, pt had her retirement party here in the hospital as she worked as a NT here at [employer]. Pt denies any chest pain, SOB, abdominal pain, worsening L hip pain, fever/chills    Hospital Course:  Principal Problem:   Closed left hip fracture, initial encounter Morgan Schmidt) Active Problems:   Other specified hypothyroidism   Mixed diabetic hyperlipidemia associated with type 2 diabetes mellitus ([place])   Essential hypertension  LEFT femoral neck fracuture S/p Closed reduction and percutaneous cannulated screw fixation on 7/22 Managed primarily by orthopedics Follow up with orthopedics: Pain management and DVT ppx as per ortho upon  discharge  Type 2 DM Uncontrolled Continue home regimen Follow up with PCP  Hypertension Continue ACEi  Hypothyroidism Continue levothyroxine   Hyponatremia Resolved     Procedures: S/p Closed reduction and percutaneous cannulated screw fixation on 7/22   Consultations:  Orthopedics   Discharge Exam: BP 124/60 (BP Location: Left Arm)   Pulse 64   Temp 97.6 F (36.4 C) (Oral)   Resp 15   Ht 4\' 11"  (1.499 m)   Wt 70.3 kg (155 lb)   LMP 03/02/2004   SpO2 93%   BMI 31.31 kg/m   General: NAD  Cardiovascular: S1, S2 present Respiratory: CTAB   Discharge Instructions You were cared for by a hospitalist during your hospital stay. If you have any questions about your discharge medications or the care you received while you were in the hospital after you are discharged, you can call the unit and asked to speak with the hospitalist on call if the hospitalist that took care of you is not available. Once you are discharged, your primary care physician will handle any further medical issues. Please note that NO REFILLS for any discharge medications will be authorized once you are discharged, as it is imperative that you return to your primary care physician (or establish a relationship with a primary care physician if you do not have one) for your aftercare needs so that they can reassess your need for medications and monitor your lab values.  Discharge Instructions    Partial weight bearing   Complete by:  As directed    % Body  Weight:  50   Laterality:  left   Extremity:  Lower   Partial weight bearing   Complete by:  As directed    % Body Weight:  50   Laterality:  left   Extremity:  Lower     Allergies as of 03/12/2018      Reactions   Ace Inhibitors Cough      Medication List    STOP taking these medications   aspirin EC 81 MG tablet Replaced by:  aspirin 81 MG chewable tablet     TAKE these medications   ACCU-CHEK FASTCLIX LANCETS Misc Use to check  Blood Sugars twice daily.   acetaminophen 325 MG tablet Commonly known as:  TYLENOL Take 325-650 mg by mouth every 6 (six) hours as needed (for pain or headaches).   aspirin 81 MG chewable tablet Commonly known as:  ASPIRIN CHILDRENS Chew 1 tablet (81 mg total) by mouth 2 (two) times daily. Take for 4 weeks, then resume regular dose. Replaces:  aspirin EC 81 MG tablet   diclofenac sodium 1 % Gel Commonly known as:  VOLTAREN Apply 2-4 g topically See admin instructions. Apply 2-4 grams to each hand up to 4 times a day as needed for arthritis in the fingers   docusate sodium 100 MG capsule Commonly known as:  COLACE Take 1 capsule (100 mg total) by mouth 2 (two) times daily.   Dulaglutide 1.5 MG/0.5ML Sopn Commonly known as:  TRULICITY INJECT 1 PEN INTO THE SKIN WEEKLY   ezetimibe 10 MG tablet Commonly known as:  ZETIA TAKE 1 TABLET BY MOUTH ONCE DAILY FOR CHOLESTEROL   Fenofibric Acid 135 MG Cpdr Take 1 capsule by mouth daily. What changed:  how much to take   ferrous sulfate 325 (65 FE) MG tablet Commonly known as:  FERROUSUL Take 1 tablet (325 mg total) by mouth 3 (three) times daily with meals.   glucose blood test strip Commonly known as:  ACCU-CHEK GUIDE Use as instructed   HYDROcodone-acetaminophen 7.5-325 MG tablet Commonly known as:  NORCO Take 1-2 tablets by mouth every 4 (four) hours as needed for moderate pain.   Insulin Glargine 100 UNIT/ML Solostar Pen Commonly known as:  LANTUS SOLOSTAR Inject 110 Units into the skin every morning. What changed:  when to take this   Insulin Pen Needle 30G X 8 MM Misc Commonly known as:  NOVOFINE use as directed   levothyroxine 125 MCG tablet Commonly known as:  SYNTHROID, LEVOTHROID Take 1 tablet (125 mcg total) by mouth daily.   metFORMIN 1000 MG tablet Commonly known as:  GLUCOPHAGE TAKE 1 TABLET BY MOUTH 2 TIMES DAILY WITH A MEAL.   methocarbamol 500 MG tablet Commonly known as:  ROBAXIN Take 1 tablet (500  mg total) by mouth every 6 (six) hours as needed for muscle spasms.   olmesartan 20 MG tablet Commonly known as:  BENICAR Take 1 tablet (20 mg total) by mouth daily.   polyethylene glycol packet Commonly known as:  MIRALAX / GLYCOLAX Take 17 g by mouth 2 (two) times daily.   Vitamin D 2000 units tablet Take 1 tablet (2,000 Units total) by mouth daily. What changed:  when to take this            Durable Medical Equipment  (From admission, onward)        Start     Ordered   03/11/18 1139  For home use only DME Walker rolling  Once    Comments:  You[employer]  walker  Question:  Patient needs a walker to treat with the following condition  Answer:  S/P ORIF (open reduction internal fixation) fracture   03/11/18 1138   03/11/18 1134  For home use only DME 3 n 1  Once     03/11/18 1135       Discharge Care Instructions  (From admission, onward)        Start     Ordered   03/12/18 0000  Partial weight bearing    Question Answer Comment  % Body Weight 50   Laterality left   Extremity Lower      03/12/18 0717   03/10/18 0000  Partial weight bearing    Question Answer Comment  % Body Weight 50   Laterality left   Extremity Lower      03/10/18 1854     Allergies  Allergen Reactions  . Ace Inhibitors Cough   Follow-up Information    Paralee Cancel, MD. Schedule an appointment as soon as possible for a visit in 2 weeks.   Specialty:  Orthopedic Surgery Contact information: [address] [place] [address] [phone]        Claretta Fraise, MD. Schedule an appointment as soon as possible for a visit in 1 week(s).   Specialty:  Family Medicine Contact information: [place] [address] [phone]            The results of significant diagnostics from this hospitalization (including imaging, microbiology, ancillary and laboratory) are listed below for reference.    Significant Diagnostic Studies: Dg Ankle Complete  Left  Result Date: 03/09/2018 CLINICAL DATA:  Initial evaluation for acute trauma, fall. EXAM: LEFT ANKLE COMPLETE - 3+ VIEW COMPARISON:  None. FINDINGS: No acute fracture or dislocation. Ankle mortise approximated. Talar dome intact. No acute soft tissue abnormality. Posterior and plantar calcaneal enthesophytes noted. IMPRESSION: No acute osseous abnormality about the left ankle. Electronically Signed   By: Jeannine Boga M.D.   On: 03/09/2018 04:33   Dg Chest Port 1 View  Result Date: 03/09/2018 CLINICAL DATA:  Preoperative evaluation for left hip fracture. EXAM: PORTABLE CHEST 1 VIEW COMPARISON:  Prior radiograph from 04/19/2010 FINDINGS: Mild cardiomegaly, stable. Mediastinal silhouette normal. Mild aortic atherosclerosis. Lungs are hypoinflated. No focal infiltrates. No pulmonary edema or pleural effusion. No pneumothorax. No acute osseous abnormality. IMPRESSION: 1. No active cardiopulmonary disease. 2. Mild cardiomegaly, stable. Electronically Signed   By: Jeannine Boga M.D.   On: 03/09/2018 07:21   Dg Knee Complete 4 Views Left  Result Date: 03/09/2018 CLINICAL DATA:  Initial evaluation for acute fall, pain. EXAM: LEFT KNEE - COMPLETE 4+ VIEW COMPARISON:  Prior radiograph from 09/22/2016 FINDINGS: No acute fracture or dislocation. No joint effusion. Enthesophyte at the superior pole the patella. Mild degenerative osteoarthritic changes about the knee. Osseous mineralization normal. No soft tissue abnormality. IMPRESSION: No acute osseous abnormality about the knee. Electronically Signed   By: Jeannine Boga M.D.   On: 03/09/2018 04:34   Dg C-arm 1-60 Min  Result Date: 03/10/2018 CLINICAL DATA:  Left hip pinning EXAM: DG C-ARM 61-120 MIN; OPERATIVE LEFT HIP WITH PELVIS COMPARISON:  03/09/2018 FINDINGS: 2 fluoroscopic spot images document placement of 3 orthopedic screws across the left femoral neck fracture, fragments in near anatomic alignment. IMPRESSION: 1. Pinning across  the left femoral neck as above. Electronically Signed   By: Lucrezia Europe M.D.   On: 03/10/2018 20:02   Dg Hip Operative Unilat W Or W/o Pelvis  Left  Result Date: 03/10/2018 CLINICAL DATA:  Left hip pinning EXAM: DG C-ARM 61-120 MIN; OPERATIVE LEFT HIP WITH PELVIS COMPARISON:  03/09/2018 FINDINGS: 2 fluoroscopic spot images document placement of 3 orthopedic screws across the left femoral neck fracture, fragments in near anatomic alignment. IMPRESSION: 1. Pinning across the left femoral neck as above. Electronically Signed   By: Lucrezia Europe M.D.   On: 03/10/2018 20:02   Dg Hip Unilat W Or Wo Pelvis 2-3 Views Left  Result Date: 03/09/2018 CLINICAL DATA:  Initial evaluation for acute trauma, fall. EXAM: DG HIP (WITH OR WITHOUT PELVIS) 2-3V LEFT COMPARISON:  None. FINDINGS: Acute subcapital fracture of the left femoral neck with slight impaction. Femoral head remains in normal alignment within the acetabulum. Bony pelvis intact. SI joints approximated. No acute abnormality about the right hip. Soft tissues within normal limits. IMPRESSION: Acute subcapital fracture of the left femoral neck with mild impaction. Electronically Signed   By: Jeannine Boga M.D.   On: 03/09/2018 04:32    Microbiology: Recent Results (from the past 240 hour(s))  Surgical pcr screen     Status: Abnormal   Collection Time: 03/10/18  2:18 AM  Result Value Ref Range Status   MRSA, PCR POSITIVE (A) NEGATIVE Final    Comment: RESULT CALLED TO, READ BACK BY AND VERIFIED WITH: HANDY,C RN [id number] 03/10/18 MITCHELL,L    Staphylococcus aureus POSITIVE (A) NEGATIVE Final    Comment: (NOTE) The Xpert SA Assay (FDA approved for NASAL specimens in patients 33 years of age and older), is one component of a comprehensive surveillance program. It is not intended to diagnose infection nor to guide or monitor treatment.      Labs: Basic Metabolic Panel: Recent Labs  Lab 03/09/18 0512 03/10/18 0340 03/11/18 0432 03/12/18 0424   NA 134* 134* 135 136  K 3.7 4.0 4.1 3.7  CL 100 100 99 101  CO2 23 23 22 25   GLUCOSE 265* 218* 272* 259*  BUN 11 9 12 19   CREATININE 0.69 0.68 0.74 0.76  CALCIUM 8.5* 7.8* 7.7* 7.5*   Liver Function Tests: No results for input(s): AST, ALT, ALKPHOS, BILITOT, PROT, ALBUMIN in the last 168 hours. No results for input(s): LIPASE, AMYLASE in the last 168 hours. No results for input(s): AMMONIA in the last 168 hours. CBC: Recent Labs  Lab 03/09/18 0512 03/10/18 0340 03/11/18 0432 03/12/18 0424  WBC 7.6 6.8 8.2 7.6  HGB 12.4 11.8* 12.2 11.2*  HCT 38.3 36.4 37.0 35.4*  MCV 84.4 84.5 83.7 86.8  PLT 212 193 214 222   Cardiac Enzymes: No results for input(s): CKTOTAL, CKMB, CKMBINDEX, TROPONINI in the last 168 hours. BNP: BNP (last 3 results) No results for input(s): BNP in the last 8760 hours.  ProBNP (last 3 results) No results for input(s): PROBNP in the last 8760 hours.  CBG: Recent Labs  Lab 03/11/18 1120 03/11/18 1639 03/11/18 2101 03/12/18 0628 03/12/18 1154  GLUCAP 247* 282* 321* 243* 340*       Signed:  Alma Friendly, MD [facility] 03/12/2018, 1:24 PM

## 2018-03-12 NOTE — Plan of Care (Signed)

## 2018-03-12 NOTE — Progress Notes (Signed)
Physical Therapy Treatment Patient Details Name: Morgan Schmidt MRN: [id number] DOB: 07/23/1951 Today's Date: 03/12/2018    History of Present Illness Morgan Schmidt is a 67 y.o. F with a L Femoral Neck Fx after a fall, s/p percutaneous cannulated screw fixation. PMH includes T2DM, HTN, Hypothyroidism,     PT Comments    Pt is progressing towards goals. Pt ambulated with MinG-Supervision with RW, and was able to tolerate balance challenges during ambulation without visible LOB. Pt performed seated HEP, and was educated on HEP . Pt practiced climbing 1 step with RW with MinG-Supervision. Pt reports comfort with all mobility tasks, and is eager to d/c home today. Will continue to follow acutely to support independence, mobility, and safety.   Follow Up Recommendations  Home health PT     Equipment Recommendations  3in1 (PT);Rolling walker with 5" wheels(Youth walker)    Recommendations for Other Services OT consult     Precautions / Restrictions Precautions Precautions: Fall Restrictions Weight Bearing Restrictions: Yes LLE Weight Bearing: Partial weight bearing LLE Partial Weight Bearing Percentage or Pounds: 50    Mobility  Bed Mobility               General bed mobility comments: Pt in chair on PT arrival  Transfers Overall transfer level: Needs assistance Equipment used: Rolling walker (2 wheeled) Transfers: Sit to/from Stand Sit to Stand: Supervision         General transfer comment: Pt was able to sit<>stand with RW with Supervision. Pt had no visible LOB, and no dizziness. Pt performed transfers with more confidence and reduced guarding of L Hip compared to previous session.    Ambulation/Gait Ambulation/Gait assistance: Min guard;Supervision Gait Distance (Feet): 100 Feet Assistive device: Rolling walker (2 wheeled) Gait Pattern/deviations: Step-through pattern;Decreased stride length;Decreased weight shift to left;Decreased stance time - left;Narrow  base of support;Antalgic Gait velocity: Decreased   General Gait Details: Pt reported some pain with ambulation, but was able to use RW with MinG, which transitioned to Supervision. Pt was able to matain prolonged head turn to L/R with ambulation, as well as look up. Pts gait speed was decreased, and walked with a narrow BOS, but had no visible LOB.    Stairs Stairs: Yes Stairs assistance: Supervision;Min guard Stair Management: No rails;Backwards;With walker Number of Stairs: 1 General stair comments: Pt was instructed on safe use of RW with stairs backwards and forwards with MinG-Supervision, and was able to follow PWB precautions. Pt reported she felt comfortable performing task, and was able to repeat back proper sequencing; "R Leg up, L Leg down."   Wheelchair Mobility    Modified Rankin (Stroke Patients Only)       Balance Overall balance assessment: Needs assistance Sitting-balance support: Feet supported;No upper extremity supported Sitting balance-Leahy Scale: Good Sitting balance - Comments: Pt was able to raise arms in chair while PT put on a gait belt.   Standing balance support: Bilateral upper extremity supported Standing balance-Leahy Scale: Poor Standing balance comment: Pt was able to stand with MinG/Supervision with RW, and no visible LOB.                             Cognition Arousal/Alertness: Awake/alert Behavior During Therapy: WFL for tasks assessed/performed Overall Cognitive Status: Within Functional Limits for tasks assessed  General Comments: Pt was alert, pleasant, and mindful of safety.      Exercises General Exercises - Lower Extremity Long Arc Quad: AROM;Both;10 reps;Seated Hip Flexion/Marching: AROM;Both;10 reps;Seated Other Exercises Other Exercises: 10X Bilateral Synchronized oppositional heel/toe raises    General Comments General comments (skin integrity, edema, etc.): Pt  reports she had her retirement party this morning, and she's looking forward to going hom "today" (03/12/2018)      Pertinent Vitals/Pain Pain Assessment: 0-10 Pain Score: 4  Pain Location: L Hip Pain Descriptors / Indicators: Grimacing;Guarding Pain Intervention(s): Limited activity within patient's tolerance;Monitored during session;Repositioned    Home Living                      Prior Function            PT Goals (current goals can now be found in the care plan section) Acute Rehab PT Goals Patient Stated Goal: To go home with her daughter PT Goal Formulation: With patient Time For Goal Achievement: 03/25/18 Potential to Achieve Goals: Good Progress towards PT goals: Progressing toward goals    Frequency    Min 5X/week      PT Plan Current plan remains appropriate    Co-evaluation              AM-PAC PT "6 Clicks" Daily Activity  Outcome Measure  Difficulty turning over in bed (including adjusting bedclothes, sheets and blankets)?: A Little Difficulty moving from lying on back to sitting on the side of the bed? : A Little Difficulty sitting down on and standing up from a chair with arms (e.g., wheelchair, bedside commode, etc,.)?: A Little Help needed moving to and from a bed to chair (including a wheelchair)?: A Little Help needed walking in hospital room?: A Little Help needed climbing 3-5 steps with a railing? : A Little 6 Click Score: 18    End of Session Equipment Utilized During Treatment: Gait belt Activity Tolerance: Patient tolerated treatment well Patient left: in chair;with call bell/phone within reach;with family/visitor present Nurse Communication: Mobility status PT Visit Diagnosis: Other abnormalities of gait and mobility (R26.89);Muscle weakness (generalized) (M62.81);History of falling (Z91.81);Pain Pain - Right/Left: Left Pain - part of body: Hip     Time: 1217-1237 PT Time Calculation (min) (ACUTE ONLY): 20  min  Charges:  $Gait Training: 8-22 mins                    G Codes:      Elwin Mocha, S-DPT [place] [phone]   03/12/2018, 1:43 PM

## 2018-03-12 NOTE — Progress Notes (Signed)
     Subjective: 2 Days Post-Op Procedure(s) (LRB): CANNULATED HIP PINNING (Left)   Patient reports pain as mild, pain controlled.  No events throughout the night.  Patient is seen today at her retirement party.  Orthopaedically stable.  Discussed the dressing as well as PWB status.   Objective:   VITALS:   Vitals:   03/11/18 2142 03/12/18 0700  BP: (!) 131/56 124/60  Pulse: 84 64  Resp: 15 15  Temp: 98.5 F (36.9 C) 97.6 F (36.4 C)  SpO2: 95% 93%    Dorsiflexion/Plantar flexion intact Incision: dressing C/D/I No cellulitis present Compartment soft  LABS Recent Labs    03/10/18 0340 03/11/18 0432 03/12/18 0424  HGB 11.8* 12.2 11.2*  HCT 36.4 37.0 35.4*  WBC 6.8 8.2 7.6  PLT 193 214 222    Recent Labs    03/10/18 0340 03/11/18 0432 03/12/18 0424  NA 134* 135 136  K 4.0 4.1 3.7  BUN 9 12 19   CREATININE 0.68 0.74 0.76  GLUCOSE 218* 272* 259*     Assessment/Plan: 2 Days Post-Op Procedure(s) (LRB): CANNULATED HIP PINNING (Left) Up with therapy Discharge home when ready medically   Ortho recommendations:  ASA 81 mg bid for 4 weeks for anticoagulation, unless other medically indicated.  Norco for pain management (Rx written).  MiraLax and Colace for constipation  Iron 325 mg tid for 2-3 weeks   PWB 50% on the left leg.  Dressing to remain in place until follow in clinic in 2 weeks.  Dressing is waterproof and may shower with it in place.  Follow up in 2 [facility] at [facility]. Follow up with OLIN,Dhiya Smits D in 2 weeks.  Contact information:  [facility] [address] [place]   PAC  03/12/2018, 7:59 AM

## 2018-03-13 ENCOUNTER — Other Ambulatory Visit: Payer: Self-pay

## 2018-03-13 NOTE — Patient Outreach (Signed)
[facility]) Care Management  03/13/2018  Morgan Schmidt Aug 09, 1951 [id number]   Telephone call for transition of care call.  Member was hospitalized 03/09/18-03/12/18 for subcapital fracture of lt hip, S/P closed reduction and percutaneous cannulated screw fixation lt hip.  Member has hx of Type 2 DM, HTN, hyperlipidemia, and hyperthyroidism.  Subjective: Member states that she fell at her Grandson's wedding and broke her hip.  States she stayed at the wedding until the bride and groom left.  States her daughter and other family members are there to help her and take her to her appts.  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  States they had her retirement breakfast before she went home yesterday.  States she has signed up for Medicare but she has not signed up for a supplement plan yet.  States her blood sugar was 83 this morning.  States that she has an appt with Dr.Olin on 03/24/18.  States she has not called her primary care for a follow up yet.      Objective: per medical record member was hospitalized 03/09/18-03/12/18 for subcapital fracture of lt hip, S/P closed reduction and percutaneous cannulated screw fixation lt hip.  Member has hx of Type 2 DM, HTN, hyperlipidemia, and hyperthyroidism. Member currently in the [employer] digital diabetes program    Assessment: Transition of care call completed Reviewed [facility] benefits and advised to call benefits line to confirm when her [employer] plan will end.Member did not take the hospital indemnity plan.  She will not apply for [facility] as she has retired. Instructed on the [employer] [place] Program to assist her to choose a Medicare Plan and given number to contact Reviewed s/s to call MD Encouraged to call to make follow up appt with primary care provider.   Plan: Plan to close case as member was assessed with no further intervention needed Plan to send information  sheet on the [employer] [place] along with a successful outreach letter Peter Garter RN, [facility], [place] Management Coordinator [facility] Care Management [phone]

## 2018-03-14 DIAGNOSIS — Z85850 Personal history of malignant neoplasm of thyroid: Secondary | ICD-10-CM | POA: Diagnosis not present

## 2018-03-14 DIAGNOSIS — E1169 Type 2 diabetes mellitus with other specified complication: Secondary | ICD-10-CM | POA: Diagnosis not present

## 2018-03-14 DIAGNOSIS — Z9181 History of falling: Secondary | ICD-10-CM | POA: Diagnosis not present

## 2018-03-14 DIAGNOSIS — W010XXD Fall on same level from slipping, tripping and stumbling without subsequent striking against object, subsequent encounter: Secondary | ICD-10-CM | POA: Diagnosis not present

## 2018-03-14 DIAGNOSIS — E038 Other specified hypothyroidism: Secondary | ICD-10-CM | POA: Diagnosis not present

## 2018-03-14 DIAGNOSIS — S72012D Unspecified intracapsular fracture of left femur, subsequent encounter for closed fracture with routine healing: Secondary | ICD-10-CM | POA: Diagnosis not present

## 2018-03-14 DIAGNOSIS — M1990 Unspecified osteoarthritis, unspecified site: Secondary | ICD-10-CM | POA: Diagnosis not present

## 2018-03-14 DIAGNOSIS — I10 Essential (primary) hypertension: Secondary | ICD-10-CM | POA: Diagnosis not present

## 2018-03-14 DIAGNOSIS — E782 Mixed hyperlipidemia: Secondary | ICD-10-CM | POA: Diagnosis not present

## 2018-03-17 DIAGNOSIS — S72012D Unspecified intracapsular fracture of left femur, subsequent encounter for closed fracture with routine healing: Secondary | ICD-10-CM | POA: Diagnosis not present

## 2018-03-17 DIAGNOSIS — E1169 Type 2 diabetes mellitus with other specified complication: Secondary | ICD-10-CM | POA: Diagnosis not present

## 2018-03-17 DIAGNOSIS — E782 Mixed hyperlipidemia: Secondary | ICD-10-CM | POA: Diagnosis not present

## 2018-03-17 DIAGNOSIS — E038 Other specified hypothyroidism: Secondary | ICD-10-CM | POA: Diagnosis not present

## 2018-03-17 DIAGNOSIS — M1990 Unspecified osteoarthritis, unspecified site: Secondary | ICD-10-CM | POA: Diagnosis not present

## 2018-03-17 DIAGNOSIS — Z9181 History of falling: Secondary | ICD-10-CM | POA: Diagnosis not present

## 2018-03-17 DIAGNOSIS — Z85850 Personal history of malignant neoplasm of thyroid: Secondary | ICD-10-CM | POA: Diagnosis not present

## 2018-03-17 DIAGNOSIS — W010XXD Fall on same level from slipping, tripping and stumbling without subsequent striking against object, subsequent encounter: Secondary | ICD-10-CM | POA: Diagnosis not present

## 2018-03-17 DIAGNOSIS — I10 Essential (primary) hypertension: Secondary | ICD-10-CM | POA: Diagnosis not present

## 2018-03-18 MED FILL — LANTUS SOLOSTAR 100 UNITS/M: 100 | 28 days supply | Qty: 30 | Fill #3

## 2018-03-19 ENCOUNTER — Ambulatory Visit (INDEPENDENT_AMBULATORY_CARE_PROVIDER_SITE_OTHER): Payer: 59 | Admitting: Family

## 2018-03-19 ENCOUNTER — Encounter: Payer: Self-pay | Admitting: Family

## 2018-03-19 VITALS — BP 133/63 | HR 76 | Temp 98.1°F | Ht 59.0 in | Wt 170.2 lb

## 2018-03-19 DIAGNOSIS — Z794 Long term (current) use of insulin: Secondary | ICD-10-CM | POA: Diagnosis not present

## 2018-03-19 DIAGNOSIS — Z09 Encounter for follow-up examination after completed treatment for conditions other than malignant neoplasm: Secondary | ICD-10-CM

## 2018-03-19 DIAGNOSIS — S72002A Fracture of unspecified part of neck of left femur, initial encounter for closed fracture: Secondary | ICD-10-CM | POA: Diagnosis not present

## 2018-03-19 DIAGNOSIS — E118 Type 2 diabetes mellitus with unspecified complications: Secondary | ICD-10-CM | POA: Diagnosis not present

## 2018-03-19 NOTE — Progress Notes (Signed)
   Subjective:    [name], female    DOB: 1951-04-21, 67 y.o.   MRN: [id number]  Chief Complaint  Patient presents with  . Hospitalization Follow-up    HPI PT presents to the office today for hospital follow up. PT states she fell at her grandson's wedding. She went to the ED and found to have femoral neck fracture. Pt had ortho surgical repair scheduled on 03/10/18.  She reports doing better. States she is having intermittent aching pain of 5 out 10. PT taking Norco that helps.    She Ortho follow up appt on 03/24/18. She has physical therapy  coming to her home two to three times a week. She reports doing 50 % weight bearing on left hip.  Pt denies and SOB, chest pain, redness, or calf tenderness.   States her glucose has been averaging 60-80's in the AM and even some times during the day.    Review of Systems  Musculoskeletal:       Left hip pain  All other systems reviewed and are negative.      Objective:   Physical Exam  Constitutional: She is oriented to person, place, and time. She appears well-developed and well-nourished. No distress.  HENT:  Head: Normocephalic and atraumatic.  Right Ear: External ear normal.  Left Ear: External ear normal.  Mouth/Throat: Oropharynx is clear and moist.  Eyes: Pupils are equal, round, and reactive to light.  Neck: Normal range of motion. Neck supple. No thyromegaly present.  Cardiovascular: Normal rate, regular rhythm, normal heart sounds and intact distal pulses.  No murmur heard. Pulmonary/Chest: Effort normal and breath sounds normal. No respiratory distress. She has no wheezes.  Abdominal: Soft. Bowel sounds are normal. She exhibits no distension. There is no tenderness.  Musculoskeletal: She exhibits no edema or tenderness.  Left hip pain with rotation and weight bearing, using rolling walker  Neurological: She is alert and oriented to person, place, and time. She has normal reflexes. No cranial nerve  deficit.  Skin: Skin is warm and dry.  Psychiatric: She has a normal mood and affect. Her behavior is normal. Judgment and thought content normal.  Vitals reviewed.     BP 133/63   Pulse 76   Temp 98.1 F (36.7 C) (Oral)   Ht '4\' 11"'$  (1.499 m)   Wt 170 lb 3.2 oz (77.2 kg)   LMP 03/02/2004   BMI 34.38 kg/m      Assessment & Plan:  Morgan Schmidt comes in today with chief complaint of Hospitalization Follow-up   Diagnosis and orders addressed:  1. Hospital discharge follow-up - CMP14+EGFR - CBC with Differential/Platelet  2. Closed left hip fracture, initial encounter ([place]) -DVT prevention discussed Red flags discussed that she would need to report - CMP14+EGFR - CBC with Differential/Platelet  3. Controlled type 2 diabetes mellitus with complication, with long-term current use of insulin ([place]) Discussed patient can decrease to 107 units of Lantus from 110 Continue low carb  - CMP14+EGFR - CBC with Differential/Platelet   Labs pending Diet and exercise encouraged  Follow up plan: Keep ortho and PCP follow up   Evelina Dun, FNP

## 2018-03-19 NOTE — Patient Instructions (Signed)
Hip Fracture A hip fracture is a fracture of the upper part of your thigh bone (femur). What are the causes? A hip fracture is caused by a direct blow to the side of your hip. This is usually the result of a fall but can occur in other circumstances, such as an automobile accident. What increases the risk? There is an increased risk of hip fractures in people with:  An unsteady walking pattern (gait) and those with conditions that contribute to poor balance, such as Parkinson's disease or dementia.  Osteopenia and osteoporosis.  Cancer that spreads to the leg bones.  Certain metabolic diseases.  What are the signs or symptoms? Symptoms of hip fracture include:  Pain over the injured hip.  Inability to put weight on the leg in which the fracture occurred (although, some patients are able to walk after a hip fracture).  Toes and foot of the affected leg point outward when you lie down.  How is this diagnosed? A physical exam can determine if a hip fracture is likely to have occurred. X-ray exams are needed to confirm the fracture and to look for other injuries. The X-ray exam can help to determine the type of hip fracture. Rarely, the fracture is not visible on an X-ray image and a CT scan or MRI will have to be done. How is this treated? The treatment for a fracture is usually surgery. This means using a screw, nail, or rod to hold the bones in place. Follow these instructions at home: Take all medicines as directed by your health care provider. Contact a health care provider if: Pain continues, even after taking pain medicine. This information is not intended to replace advice given to you by your health care provider. Make sure you discuss any questions you have with your health care provider. Document Released: 08/06/2005 Document Revised: 01/12/2016 Document Reviewed: 03/18/2013 Elsevier Interactive Patient Education  2017 Elsevier Inc.  

## 2018-03-20 LAB — CMP14+EGFR
A/G RATIO: 2.1 (ref 1.2–2.2)
ALT: 30 IU/L (ref 0–32)
AST: 34 IU/L (ref 0–40)
Albumin: 4.5 g/dL (ref 3.6–4.8)
Alkaline Phosphatase: 67 IU/L (ref 39–117)
BILIRUBIN TOTAL: 0.6 mg/dL (ref 0.0–1.2)
BUN/Creatinine Ratio: 12 (ref 12–28)
BUN: 9 mg/dL (ref 8–27)
CALCIUM: 8.9 mg/dL (ref 8.7–10.3)
CHLORIDE: 99 mmol/L (ref 96–106)
CO2: 24 mmol/L (ref 20–29)
Creatinine, Ser: 0.75 mg/dL (ref 0.57–1.00)
GFR calc Af Amer: 97 mL/min/{1.73_m2} (ref >59)
GFR calc non Af Amer: 84 mL/min/{1.73_m2} (ref >59)
GLOBULIN, TOTAL: 2.1 g/dL (ref 1.5–4.5)
Glucose: 108 mg/dL — ABNORMAL HIGH (ref 65–99)
POTASSIUM: 4.4 mmol/L (ref 3.5–5.2)
Sodium: 139 mmol/L (ref 134–144)
Total Protein: 6.6 g/dL (ref 6.0–8.5)

## 2018-03-20 LAB — CBC WITH DIFFERENTIAL/PLATELET
Basophils Absolute: 0.1 10*3/uL (ref 0.0–0.2)
Basos: 1 %
EOS (ABSOLUTE): 0.2 10*3/uL (ref 0.0–0.4)
Eos: 3 %
Hematocrit: 40.5 % (ref 34.0–46.6)
Hemoglobin: 12.5 g/dL (ref 11.1–15.9)
IMMATURE GRANULOCYTES: 1 %
Immature Grans (Abs): 0.1 10*3/uL (ref 0.0–0.1)
Lymphocytes Absolute: 1.7 10*3/uL (ref 0.7–3.1)
Lymphs: 27 %
MCH: 26 pg — ABNORMAL LOW (ref 26.6–33.0)
MCHC: 30.9 g/dL — ABNORMAL LOW (ref 31.5–35.7)
MCV: 84 fL (ref 79–97)
Monocytes Absolute: 0.5 10*3/uL (ref 0.1–0.9)
Monocytes: 7 %
Neutrophils Absolute: 3.9 10*3/uL (ref 1.4–7.0)
Neutrophils: 61 %
Platelets: 274 10*3/uL (ref 150–450)
RBC: 4.8 x10E6/uL (ref 3.77–5.28)
RDW: 14.6 % (ref 12.3–15.4)
WBC: 6.3 10*3/uL (ref 3.4–10.8)

## 2018-03-24 DIAGNOSIS — Z4789 Encounter for other orthopedic aftercare: Secondary | ICD-10-CM | POA: Diagnosis not present

## 2018-03-25 DIAGNOSIS — Z85850 Personal history of malignant neoplasm of thyroid: Secondary | ICD-10-CM | POA: Diagnosis not present

## 2018-03-25 DIAGNOSIS — E782 Mixed hyperlipidemia: Secondary | ICD-10-CM | POA: Diagnosis not present

## 2018-03-25 DIAGNOSIS — M1990 Unspecified osteoarthritis, unspecified site: Secondary | ICD-10-CM | POA: Diagnosis not present

## 2018-03-25 DIAGNOSIS — W010XXD Fall on same level from slipping, tripping and stumbling without subsequent striking against object, subsequent encounter: Secondary | ICD-10-CM | POA: Diagnosis not present

## 2018-03-25 DIAGNOSIS — S72012D Unspecified intracapsular fracture of left femur, subsequent encounter for closed fracture with routine healing: Secondary | ICD-10-CM | POA: Diagnosis not present

## 2018-03-25 DIAGNOSIS — I10 Essential (primary) hypertension: Secondary | ICD-10-CM | POA: Diagnosis not present

## 2018-03-25 DIAGNOSIS — E1169 Type 2 diabetes mellitus with other specified complication: Secondary | ICD-10-CM | POA: Diagnosis not present

## 2018-03-25 DIAGNOSIS — Z9181 History of falling: Secondary | ICD-10-CM | POA: Diagnosis not present

## 2018-03-25 DIAGNOSIS — E038 Other specified hypothyroidism: Secondary | ICD-10-CM | POA: Diagnosis not present

## 2018-04-09 MED FILL — FENOFIBRIC ACID 135 MG CPDR: 135 | 30 days supply | Qty: 30 | Fill #2

## 2018-04-24 MED FILL — TRULICITY 1.5 MG/0.5 ML PEN: 1.5 | 28 days supply | Qty: 2 | Fill #2

## 2018-04-24 MED FILL — LANTUS SOLOSTAR 100 UNITS/M: 100 | 28 days supply | Qty: 30 | Fill #4

## 2018-05-19 MED FILL — EZETIMIBE 10 MG TABLET: 10 | 90 days supply | Qty: 90 | Fill #1

## 2018-05-19 MED FILL — LEVOTHYROXINE 125 MCG TAB: 125 | 90 days supply | Qty: 90 | Fill #2

## 2018-05-19 MED FILL — FENOFIBRIC ACID 135 MG CPDR: 135 | 30 days supply | Qty: 30 | Fill #3

## 2018-05-19 MED FILL — metFORMIN HCL 1000 MG TABS: 1000 | 90 days supply | Qty: 180 | Fill #1

## 2018-05-22 DIAGNOSIS — Z4789 Encounter for other orthopedic aftercare: Secondary | ICD-10-CM | POA: Diagnosis not present

## 2018-05-22 DIAGNOSIS — M1712 Unilateral primary osteoarthritis, left knee: Secondary | ICD-10-CM | POA: Diagnosis not present

## 2018-05-22 DIAGNOSIS — M25562 Pain in left knee: Secondary | ICD-10-CM | POA: Diagnosis not present

## 2018-05-22 MED FILL — TRULICITY 1.5 MG/0.5 ML PEN: 1.5 | 28 days supply | Qty: 2 | Fill #3

## 2018-06-02 ENCOUNTER — Other Ambulatory Visit: Payer: Self-pay

## 2018-06-02 DIAGNOSIS — E782 Mixed hyperlipidemia: Secondary | ICD-10-CM | POA: Diagnosis not present

## 2018-06-02 DIAGNOSIS — E1169 Type 2 diabetes mellitus with other specified complication: Secondary | ICD-10-CM | POA: Diagnosis not present

## 2018-06-02 DIAGNOSIS — E038 Other specified hypothyroidism: Secondary | ICD-10-CM

## 2018-06-02 LAB — BAYER DCA HB A1C WAIVED: HB A1C (BAYER DCA - WAIVED): 7.5 % — ABNORMAL HIGH (ref <7.0)

## 2018-06-02 MED FILL — LANTUS SOLOSTAR 100 UNITS/M: 100 | 28 days supply | Qty: 30 | Fill #5

## 2018-06-03 ENCOUNTER — Encounter: Payer: Self-pay | Admitting: Family Medicine

## 2018-06-03 ENCOUNTER — Telehealth: Payer: Self-pay | Admitting: Family Medicine

## 2018-06-03 ENCOUNTER — Ambulatory Visit (INDEPENDENT_AMBULATORY_CARE_PROVIDER_SITE_OTHER): Payer: PPO | Admitting: Family Medicine

## 2018-06-03 VITALS — BP 132/75 | HR 78 | Temp 98.5°F | Ht 59.0 in | Wt 170.3 lb

## 2018-06-03 DIAGNOSIS — Z794 Long term (current) use of insulin: Secondary | ICD-10-CM

## 2018-06-03 DIAGNOSIS — Z23 Encounter for immunization: Secondary | ICD-10-CM | POA: Diagnosis not present

## 2018-06-03 DIAGNOSIS — I10 Essential (primary) hypertension: Secondary | ICD-10-CM

## 2018-06-03 DIAGNOSIS — E118 Type 2 diabetes mellitus with unspecified complications: Secondary | ICD-10-CM | POA: Diagnosis not present

## 2018-06-03 LAB — CBC WITH DIFFERENTIAL/PLATELET
BASOS ABS: 0.1 10*3/uL (ref 0.0–0.2)
Basos: 1 %
EOS (ABSOLUTE): 0.1 10*3/uL (ref 0.0–0.4)
Eos: 2 %
Hematocrit: 39.2 % (ref 34.0–46.6)
Hemoglobin: 12.8 g/dL (ref 11.1–15.9)
Immature Grans (Abs): 0 10*3/uL (ref 0.0–0.1)
Immature Granulocytes: 1 %
LYMPHS ABS: 1.9 10*3/uL (ref 0.7–3.1)
Lymphs: 30 %
MCH: 26.4 pg — AB (ref 26.6–33.0)
MCHC: 32.7 g/dL (ref 31.5–35.7)
MCV: 81 fL (ref 79–97)
MONOS ABS: 0.4 10*3/uL (ref 0.1–0.9)
Monocytes: 7 %
NEUTROS ABS: 3.8 10*3/uL (ref 1.4–7.0)
NEUTROS PCT: 59 %
PLATELETS: 237 10*3/uL (ref 150–450)
RBC: 4.85 x10E6/uL (ref 3.77–5.28)
RDW: 13 % (ref 12.3–15.4)
WBC: 6.4 10*3/uL (ref 3.4–10.8)

## 2018-06-03 LAB — CMP14+EGFR
ALK PHOS: 61 IU/L (ref 39–117)
ALT: 30 IU/L (ref 0–32)
AST: 28 IU/L (ref 0–40)
Albumin/Globulin Ratio: 2.2 (ref 1.2–2.2)
Albumin: 4.4 g/dL (ref 3.6–4.8)
BILIRUBIN TOTAL: 0.4 mg/dL (ref 0.0–1.2)
BUN / CREAT RATIO: 14 (ref 12–28)
BUN: 10 mg/dL (ref 8–27)
CHLORIDE: 102 mmol/L (ref 96–106)
CO2: 23 mmol/L (ref 20–29)
Calcium: 8.4 mg/dL — ABNORMAL LOW (ref 8.7–10.3)
Creatinine, Ser: 0.72 mg/dL (ref 0.57–1.00)
GFR calc Af Amer: 102 mL/min/{1.73_m2} (ref >59)
GFR calc non Af Amer: 88 mL/min/{1.73_m2} (ref >59)
Globulin, Total: 2 g/dL (ref 1.5–4.5)
Glucose: 89 mg/dL (ref 65–99)
Potassium: 4 mmol/L (ref 3.5–5.2)
Sodium: 142 mmol/L (ref 134–144)
Total Protein: 6.4 g/dL (ref 6.0–8.5)

## 2018-06-03 LAB — LIPID PANEL
CHOLESTEROL TOTAL: 154 mg/dL (ref 100–199)
Chol/HDL Ratio: 4.2 ratio (ref 0.0–4.4)
HDL: 37 mg/dL — AB (ref >39)
LDL Calculated: 84 mg/dL (ref 0–99)
Triglycerides: 167 mg/dL — ABNORMAL HIGH (ref 0–149)
VLDL CHOLESTEROL CAL: 33 mg/dL (ref 5–40)

## 2018-06-03 LAB — T4 AND TSH
T4, Total: 11.1 ug/dL (ref 4.5–12.0)
TSH: 1.51 u[IU]/mL (ref 0.450–4.500)

## 2018-06-03 MED ORDER — METFORMIN HCL 1000 MG PO TABS: ORAL_TABLET | ORAL | 1 refills | Status: DC

## 2018-06-03 MED ORDER — OLMESARTAN MEDOXOMIL 20 MG PO TABS: 20.0000 mg | ORAL_TABLET | Freq: Every day | ORAL | 1 refills | Status: DC

## 2018-06-03 MED ORDER — INSULIN PEN NEEDLE 30G X 8 MM MISC: 1 refills | Status: DC

## 2018-06-03 MED ORDER — DULAGLUTIDE 1.5 MG/0.5ML ~~LOC~~ SOAJ: SUBCUTANEOUS | 3 refills | Status: DC

## 2018-06-03 MED ORDER — FENOFIBRIC ACID 135 MG PO CPDR: 135.0000 mg | DELAYED_RELEASE_CAPSULE | Freq: Every day | ORAL | 3 refills | Status: DC

## 2018-06-03 MED ORDER — INSULIN GLARGINE 100 UNIT/ML SOLOSTAR PEN: 110.0000 [IU] | PEN_INJECTOR | Freq: Every day | SUBCUTANEOUS | 3 refills | Status: DC

## 2018-06-03 MED ORDER — INSULIN PEN NEEDLE 31G X 8 MM MISC: 1.0000 | Freq: Every day | 11 refills | Status: DC | PRN

## 2018-06-03 NOTE — Telephone Encounter (Signed)
New rx for insulin pen needles sent to pharmacy

## 2018-06-03 NOTE — Progress Notes (Signed)
Subjective:  [name], female    DOB: [birth year]  Age: 67 y.o. MRN: [id number]  CC: Medical Management of Chronic Issues   HPI Morgan Schmidt presents forFollow-up of diabetes. Patient checks blood sugar at home.   101 fasting and under 180 postprandial Patient denies symptoms such as polyuria, polydipsia, excessive hunger, nausea No significant hypoglycemic spells noted. Medications reviewed. Pt reports taking them regularly without complication/adverse reaction being reported today.  Checking feet daily. Last eye appt was over 1 year ago.  History Morgan Schmidt has a past medical history of Arthritis, Cancer ([place]), Diabetes mellitus, Hip fracture ([place]), and Thyroid disease.   Morgan Schmidt has a past surgical history that includes Thyroidectomy (2009); Cesarean section; Knee arthroscopy (Left, 10/22/2016); Knee arthroscopy with medial menisectomy (10/22/2016); Knee arthroscopy with subchondroplasty (Left, 10/22/2016); and Hip pinning, cannulated (Left, 03/10/2018).   Morgan Schmidt family history is not on file.Morgan Schmidt reports that Morgan Schmidt quit smoking about 17 years ago. Morgan Schmidt has never used smokeless tobacco. Morgan Schmidt reports that Morgan Schmidt does not drink alcohol or use drugs.  Current Outpatient Medications on File Prior to Visit  Medication Sig Dispense Refill  . ACCU-CHEK FASTCLIX LANCETS MISC Use to check Blood Sugars twice daily. 102 each 3  . acetaminophen (TYLENOL) 325 MG tablet Take 325-650 mg by mouth every 6 (six) hours as needed (for pain or headaches).    . Cholecalciferol (VITAMIN D) 2000 UNITS tablet Take 1 tablet (2,000 Units total) by mouth daily. (Patient taking differently: Take 2,000 Units by mouth every evening. ) 30 tablet 11  . diclofenac sodium (VOLTAREN) 1 % GEL Apply 2-4 g topically See admin instructions. Apply 2-4 grams to each hand up to 4 times a day as needed for arthritis in the fingers    . docusate sodium (COLACE) 100 MG capsule Take 1 capsule (100 mg total) by mouth 2 (two) times  daily. 10 capsule 0  . ezetimibe (ZETIA) 10 MG tablet TAKE 1 TABLET BY MOUTH ONCE DAILY FOR CHOLESTEROL 90 tablet 3  . glucose blood (ACCU-CHEK GUIDE) test strip Use as instructed 100 each 3  . levothyroxine (SYNTHROID, LEVOTHROID) 125 MCG tablet Take 1 tablet (125 mcg total) by mouth daily. 90 tablet 2  . methocarbamol (ROBAXIN) 500 MG tablet Take 1 tablet (500 mg total) by mouth every 6 (six) hours as needed for muscle spasms. 40 tablet 0   No current facility-administered medications on file prior to visit.     ROS Review of Systems  Constitutional: Negative.   HENT: Negative for congestion.   Eyes: Negative for visual disturbance.  Respiratory: Negative for shortness of breath.   Cardiovascular: Negative for chest pain.  Gastrointestinal: Negative for abdominal pain, constipation, diarrhea, nausea and vomiting.  Genitourinary: Negative for difficulty urinating.  Musculoskeletal: Negative for arthralgias and myalgias.  Neurological: Negative for headaches.  Psychiatric/Behavioral: Negative for sleep disturbance.    Objective:  BP 132/75   Pulse 78   Temp 98.5 F (36.9 C) (Oral)   Ht 4' 11" (1.499 m)   Wt 170 lb 5.1 oz (77.3 kg)   LMP 03/02/2004   BMI 34.40 kg/m   BP Readings from Last 3 Encounters:  06/03/18 132/75  03/19/18 133/63  03/12/18 124/60    Wt Readings from Last 3 Encounters:  06/03/18 170 lb 5.1 oz (77.3 kg)  03/19/18 170 lb 3.2 oz (77.2 kg)  03/09/18 155 lb (70.3 kg)     Physical Exam  Constitutional: Morgan Schmidt is oriented to person, place, and time. Morgan Schmidt  appears well-developed and well-nourished. No distress.  HENT:  Head: Normocephalic and atraumatic.  Right Ear: External ear normal.  Left Ear: External ear normal.  Nose: Nose normal.  Mouth/Throat: Oropharynx is clear and moist.  Eyes: Pupils are equal, round, and reactive to light. Conjunctivae and EOM are normal.  Neck: Normal range of motion. Neck supple. No thyromegaly present.  Cardiovascular:  Normal rate, regular rhythm and normal heart sounds.  No murmur heard. Pulmonary/Chest: Effort normal and breath sounds normal. No respiratory distress. Morgan Schmidt has no wheezes. Morgan Schmidt has no rales.  Abdominal: Soft. Bowel sounds are normal. Morgan Schmidt exhibits no distension. There is no tenderness.  Lymphadenopathy:    Morgan Schmidt has no cervical adenopathy.  Neurological: Morgan Schmidt is alert and oriented to person, place, and time. Morgan Schmidt has normal reflexes.  Skin: Skin is warm and dry.  Psychiatric: Morgan Schmidt has a normal mood and affect. Morgan Schmidt behavior is normal. Judgment and thought content normal.   Results for orders placed or performed in visit on 06/02/18  T4 AND TSH  Result Value Ref Range   TSH 1.510 0.450 - 4.500 uIU/mL   T4, Total 11.1 4.5 - 12.0 ug/dL  CBC with Differential/Platelet  Result Value Ref Range   WBC 6.4 3.4 - 10.8 x10E3/uL   RBC 4.85 3.77 - 5.28 x10E6/uL   Hemoglobin 12.8 11.1 - 15.9 g/dL   Hematocrit 39.2 34.0 - 46.6 %   MCV 81 79 - 97 fL   MCH 26.4 (L) 26.6 - 33.0 pg   MCHC 32.7 31.5 - 35.7 g/dL   RDW 13.0 12.3 - 15.4 %   Platelets 237 150 - 450 x10E3/uL   Neutrophils 59 Not Estab. %   Lymphs 30 Not Estab. %   Monocytes 7 Not Estab. %   Eos 2 Not Estab. %   Basos 1 Not Estab. %   Neutrophils Absolute 3.8 1.4 - 7.0 x10E3/uL   Lymphocytes Absolute 1.9 0.7 - 3.1 x10E3/uL   Monocytes Absolute 0.4 0.1 - 0.9 x10E3/uL   EOS (ABSOLUTE) 0.1 0.0 - 0.4 x10E3/uL   Basophils Absolute 0.1 0.0 - 0.2 x10E3/uL   Immature Granulocytes 1 Not Estab. %   Immature Grans (Abs) 0.0 0.0 - 0.1 x10E3/uL  CMP14+EGFR  Result Value Ref Range   Glucose 89 65 - 99 mg/dL   BUN 10 8 - 27 mg/dL   Creatinine, Ser 0.72 0.57 - 1.00 mg/dL   GFR calc non Af Amer 88 >59 mL/min/1.73   GFR calc Af Amer 102 >59 mL/min/1.73   BUN/Creatinine Ratio 14 12 - 28   Sodium 142 134 - 144 mmol/L   Potassium 4.0 3.5 - 5.2 mmol/L   Chloride 102 96 - 106 mmol/L   CO2 23 20 - 29 mmol/L   Calcium 8.4 (L) 8.7 - 10.3 mg/dL   Total  Protein 6.4 6.0 - 8.5 g/dL   Albumin 4.4 3.6 - 4.8 g/dL   Globulin, Total 2.0 1.5 - 4.5 g/dL   Albumin/Globulin Ratio 2.2 1.2 - 2.2   Bilirubin Total 0.4 0.0 - 1.2 mg/dL   Alkaline Phosphatase 61 39 - 117 IU/L   AST 28 0 - 40 IU/L   ALT 30 0 - 32 IU/L  Lipid panel  Result Value Ref Range   Cholesterol, Total 154 100 - 199 mg/dL   Triglycerides 167 (H) 0 - 149 mg/dL   HDL 37 (L) >39 mg/dL   VLDL Cholesterol Cal 33 5 - 40 mg/dL   LDL Calculated 84 0 - 99 mg/dL  Chol/HDL Ratio 4.2 0.0 - 4.4 ratio  Bayer DCA Hb A1c Waived  Result Value Ref Range   HB A1C (BAYER DCA - WAIVED) 7.5 (H) <7.0 %      Assessment & Plan:   Morgan Schmidt was seen today for medical management of chronic issues.  Diagnoses and all orders for this visit:  Controlled type 2 diabetes mellitus with complication, with long-term current use of insulin ([place])  Encounter for immunization -     Flu vaccine HIGH DOSE PF  Essential hypertension  Other orders -     olmesartan (BENICAR) 20 MG tablet; Take 1 tablet (20 mg total) by mouth daily. -     metFORMIN (GLUCOPHAGE) 1000 MG tablet; TAKE 1 TABLET BY MOUTH 2 TIMES DAILY WITH A MEAL. -     Discontinue: Insulin Pen Needle (NOVOFINE) 30G X 8 MM MISC; use as directed -     Insulin Glargine (LANTUS SOLOSTAR) 100 UNIT/ML Solostar Pen; Inject 110 Units into the skin daily after breakfast. -     Dulaglutide (TRULICITY) 1.5 LP/5.3YY SOPN; INJECT 1 PEN INTO THE SKIN WEEKLY -     Choline Fenofibrate (FENOFIBRIC ACID) 135 MG CPDR; Take 135 mg by mouth daily.      I have discontinued Ilia Dimaano. Neece's Insulin Pen Needle, ferrous sulfate, polyethylene glycol, and HYDROcodone-acetaminophen. I have also changed Morgan Schmidt Insulin Glargine and Fenofibric Acid. Additionally, I am having Morgan Schmidt maintain Morgan Schmidt Vitamin D, diclofenac sodium, ezetimibe, glucose blood, levothyroxine, ACCU-CHEK FASTCLIX LANCETS, acetaminophen, docusate sodium, methocarbamol, olmesartan, metFORMIN, and  Dulaglutide.  Meds ordered this encounter  Medications  . olmesartan (BENICAR) 20 MG tablet    Sig: Take 1 tablet (20 mg total) by mouth daily.    Dispense:  90 tablet    Refill:  1  . metFORMIN (GLUCOPHAGE) 1000 MG tablet    Sig: TAKE 1 TABLET BY MOUTH 2 TIMES DAILY WITH A MEAL.    Dispense:  180 tablet    Refill:  1  . DISCONTD: Insulin Pen Needle (NOVOFINE) 30G X 8 MM MISC    Sig: use as directed    Dispense:  100 each    Refill:  1    E11.9  . Insulin Glargine (LANTUS SOLOSTAR) 100 UNIT/ML Solostar Pen    Sig: Inject 110 Units into the skin daily after breakfast.    Dispense:  5 pen    Refill:  3  . Dulaglutide (TRULICITY) 1.5 FR/1.0YT SOPN    Sig: INJECT 1 PEN INTO THE SKIN WEEKLY    Dispense:  12 mL    Refill:  3  . Choline Fenofibrate (FENOFIBRIC ACID) 135 MG CPDR    Sig: Take 135 mg by mouth daily.    Dispense:  90 capsule    Refill:  3     Follow-up: Return in about 3 months (around 09/03/2018).  Claretta Fraise, M.D.

## 2018-06-15 ENCOUNTER — Encounter: Payer: Self-pay | Admitting: Family Medicine

## 2018-06-18 MED FILL — TRULICITY 1.5 MG/0.5 ML PEN: 1.5 | 28 days supply | Qty: 2 | Fill #4

## 2018-07-01 DIAGNOSIS — M25562 Pain in left knee: Secondary | ICD-10-CM | POA: Insufficient documentation

## 2018-07-01 MED FILL — LANTUS SOLOSTAR 100 UNITS/M: 100 | 14 days supply | Qty: 15 | Fill #6

## 2018-07-02 DIAGNOSIS — M25562 Pain in left knee: Secondary | ICD-10-CM | POA: Diagnosis not present

## 2018-07-02 DIAGNOSIS — S72002D Fracture of unspecified part of neck of left femur, subsequent encounter for closed fracture with routine healing: Secondary | ICD-10-CM | POA: Diagnosis not present

## 2018-07-15 MED FILL — TRULICITY 1.5 MG/0.5 ML PEN: 1.5 | 28 days supply | Qty: 2 | Fill #5

## 2018-07-22 MED FILL — LANTUS SOLOSTAR 100 UNITS/M: 100 | 14 days supply | Qty: 15 | Fill #7

## 2018-08-06 MED FILL — LANTUS SOLOSTAR 100 UNITS/M: 100 | 14 days supply | Qty: 15 | Fill #8

## 2018-08-14 MED FILL — LEVOTHYROXINE 125 MCG TAB: 125 | 90 days supply | Qty: 90 | Fill #0

## 2018-08-21 MED FILL — TRULICITY 1.5 MG/0.5 ML PEN: 1.5 | 28 days supply | Qty: 2 | Fill #6

## 2018-08-21 MED FILL — LANTUS SOLOSTAR 100 UNITS/M: 100 | 28 days supply | Qty: 30 | Fill #9

## 2018-08-21 MED FILL — UNIFINE PENTIPS 8MM 31G: 31G X 8 MM | 30 days supply | Qty: 100 | Fill #1

## 2018-09-02 ENCOUNTER — Other Ambulatory Visit: Payer: PPO

## 2018-09-02 DIAGNOSIS — E038 Other specified hypothyroidism: Secondary | ICD-10-CM

## 2018-09-02 DIAGNOSIS — E782 Mixed hyperlipidemia: Secondary | ICD-10-CM

## 2018-09-02 DIAGNOSIS — E1169 Type 2 diabetes mellitus with other specified complication: Secondary | ICD-10-CM | POA: Diagnosis not present

## 2018-09-02 LAB — BAYER DCA HB A1C WAIVED: HB A1C (BAYER DCA - WAIVED): 6.8 % (ref <7.0)

## 2018-09-03 ENCOUNTER — Encounter: Payer: Self-pay | Admitting: Family Medicine

## 2018-09-03 ENCOUNTER — Ambulatory Visit (INDEPENDENT_AMBULATORY_CARE_PROVIDER_SITE_OTHER): Payer: PPO | Admitting: Family Medicine

## 2018-09-03 VITALS — BP 133/69 | HR 73 | Temp 98.4°F | Ht 59.0 in | Wt 171.5 lb

## 2018-09-03 DIAGNOSIS — H903 Sensorineural hearing loss, bilateral: Secondary | ICD-10-CM

## 2018-09-03 DIAGNOSIS — Z794 Long term (current) use of insulin: Secondary | ICD-10-CM

## 2018-09-03 DIAGNOSIS — Z23 Encounter for immunization: Secondary | ICD-10-CM | POA: Diagnosis not present

## 2018-09-03 DIAGNOSIS — E782 Mixed hyperlipidemia: Secondary | ICD-10-CM | POA: Diagnosis not present

## 2018-09-03 DIAGNOSIS — E118 Type 2 diabetes mellitus with unspecified complications: Secondary | ICD-10-CM

## 2018-09-03 DIAGNOSIS — E039 Hypothyroidism, unspecified: Secondary | ICD-10-CM | POA: Diagnosis not present

## 2018-09-03 DIAGNOSIS — E1169 Type 2 diabetes mellitus with other specified complication: Secondary | ICD-10-CM

## 2018-09-03 DIAGNOSIS — I10 Essential (primary) hypertension: Secondary | ICD-10-CM | POA: Diagnosis not present

## 2018-09-03 LAB — CMP14+EGFR
ALK PHOS: 79 IU/L (ref 39–117)
ALT: 26 IU/L (ref 0–32)
AST: 26 IU/L (ref 0–40)
Albumin/Globulin Ratio: 2.1 (ref 1.2–2.2)
Albumin: 4.5 g/dL (ref 3.6–4.8)
BUN/Creatinine Ratio: 11 — ABNORMAL LOW (ref 12–28)
BUN: 7 mg/dL — ABNORMAL LOW (ref 8–27)
Bilirubin Total: 0.6 mg/dL (ref 0.0–1.2)
CO2: 24 mmol/L (ref 20–29)
CREATININE: 0.63 mg/dL (ref 0.57–1.00)
Calcium: 8.7 mg/dL (ref 8.7–10.3)
Chloride: 98 mmol/L (ref 96–106)
GFR calc Af Amer: 109 mL/min/{1.73_m2} (ref >59)
GFR calc non Af Amer: 94 mL/min/{1.73_m2} (ref >59)
Globulin, Total: 2.1 g/dL (ref 1.5–4.5)
Glucose: 116 mg/dL — ABNORMAL HIGH (ref 65–99)
Potassium: 4 mmol/L (ref 3.5–5.2)
SODIUM: 138 mmol/L (ref 134–144)
TOTAL PROTEIN: 6.6 g/dL (ref 6.0–8.5)

## 2018-09-03 LAB — T4 AND TSH
T4, Total: 13.4 ug/dL — ABNORMAL HIGH (ref 4.5–12.0)
TSH: 1.18 u[IU]/mL (ref 0.450–4.500)

## 2018-09-03 LAB — LIPID PANEL
CHOLESTEROL TOTAL: 198 mg/dL (ref 100–199)
Chol/HDL Ratio: 4.4 ratio (ref 0.0–4.4)
HDL: 45 mg/dL (ref >39)
LDL Calculated: 118 mg/dL — ABNORMAL HIGH (ref 0–99)
Triglycerides: 176 mg/dL — ABNORMAL HIGH (ref 0–149)
VLDL CHOLESTEROL CAL: 35 mg/dL (ref 5–40)

## 2018-09-03 LAB — CBC WITH DIFFERENTIAL/PLATELET
BASOS: 1 %
Basophils Absolute: 0.1 10*3/uL (ref 0.0–0.2)
EOS (ABSOLUTE): 0.1 10*3/uL (ref 0.0–0.4)
EOS: 2 %
HEMATOCRIT: 41.6 % (ref 34.0–46.6)
Hemoglobin: 13.5 g/dL (ref 11.1–15.9)
IMMATURE GRANS (ABS): 0 10*3/uL (ref 0.0–0.1)
IMMATURE GRANULOCYTES: 1 %
Lymphocytes Absolute: 2 10*3/uL (ref 0.7–3.1)
Lymphs: 33 %
MCH: 27.1 pg (ref 26.6–33.0)
MCHC: 32.5 g/dL (ref 31.5–35.7)
MCV: 84 fL (ref 79–97)
Monocytes Absolute: 0.3 10*3/uL (ref 0.1–0.9)
Monocytes: 6 %
Neutrophils Absolute: 3.5 10*3/uL (ref 1.4–7.0)
Neutrophils: 57 %
Platelets: 237 10*3/uL (ref 150–450)
RBC: 4.98 x10E6/uL (ref 3.77–5.28)
RDW: 13.5 % (ref 11.7–15.4)
WBC: 6.1 10*3/uL (ref 3.4–10.8)

## 2018-09-03 MED ORDER — OLMESARTAN MEDOXOMIL 20 MG PO TABS: 20.0000 mg | ORAL_TABLET | Freq: Every day | ORAL | 1 refills | Status: DC

## 2018-09-03 MED ORDER — INSULIN GLARGINE 100 UNIT/ML SOLOSTAR PEN: 110.0000 [IU] | PEN_INJECTOR | Freq: Every day | SUBCUTANEOUS | 3 refills | Status: DC

## 2018-09-03 MED ORDER — METFORMIN HCL 1000 MG PO TABS: ORAL_TABLET | ORAL | 1 refills | Status: DC

## 2018-09-03 MED ORDER — EZETIMIBE 10 MG PO TABS: ORAL_TABLET | ORAL | 3 refills | Status: DC

## 2018-09-03 MED FILL — EZETIMIBE 10 MG TABS: 10 | 90 days supply | Qty: 90 | Fill #0

## 2018-09-03 MED FILL — OLMESARTAN MEDOXOMIL 20 MG: 20 | 90 days supply | Qty: 90 | Fill #0

## 2018-09-03 MED FILL — metFORMIN HCL 1000 MG TABS: 1000 | 90 days supply | Qty: 180 | Fill #0

## 2018-09-09 ENCOUNTER — Encounter: Payer: Self-pay | Admitting: Family Medicine

## 2018-09-09 NOTE — Progress Notes (Signed)
Subjective:  [name],  female    DOB: 1950-08-31  Age: 68 y.o.    CC: Medical Management of Chronic Issues   HPI Morgan Schmidt presents for  follow-up of hypertension. Patient has no history of headache chest pain or shortness of breath or recent cough. Patient also denies symptoms of TIA such as numbness weakness lateralizing. Patient denies side effects from medication. States taking it regularly.  Patient also  in for follow-up of elevated cholesterol. Doing well without complaints on current medication. Denies side effects  including myalgia and arthralgia and nausea. Also in today for liver function testing. Currently no chest pain, shortness of breath or other cardiovascular related symptoms noted.  Follow-up of diabetes. Patient does check blood sugar at home. Readings run between 100 and 150 Patient denies symptoms such as excessive hunger or urinary frequency, excessive hunger, nausea No significant hypoglycemic spells noted. Medications reviewed. Pt reports taking them regularly. Pt. denies complication/adverse reaction today.    History Morgan Schmidt has a past medical history of Arthritis, Cancer ([place]), Diabetes mellitus, Hip fracture ([place]), and Thyroid disease.   She has a past surgical history that includes Thyroidectomy (2009); Cesarean section; Knee arthroscopy (Left, 10/22/2016); Knee arthroscopy with medial menisectomy (10/22/2016); Knee arthroscopy with subchondroplasty (Left, 10/22/2016); and Hip pinning, cannulated (Left, 03/10/2018).   Her family history is not on file.She reports that she quit smoking about 18 years ago. She has never used smokeless tobacco. She reports that she does not drink alcohol or use drugs.  Current Outpatient Medications on File Prior to Visit  Medication Sig Dispense Refill  . ACCU-CHEK FASTCLIX LANCETS MISC Use to check Blood Sugars twice daily. 102 each 3  . acetaminophen (TYLENOL) 325 MG tablet Take 325-650 mg by mouth  every 6 (six) hours as needed (for pain or headaches).    . Cholecalciferol (VITAMIN D) 2000 UNITS tablet Take 1 tablet (2,000 Units total) by mouth daily. (Patient taking differently: Take 2,000 Units by mouth every evening. ) 30 tablet 11  . Choline Fenofibrate (FENOFIBRIC ACID) 135 MG CPDR Take 135 mg by mouth daily. 90 capsule 3  . diclofenac sodium (VOLTAREN) 1 % GEL Apply 2-4 g topically See admin instructions. Apply 2-4 grams to each hand up to 4 times a day as needed for arthritis in the fingers    . docusate sodium (COLACE) 100 MG capsule Take 1 capsule (100 mg total) by mouth 2 (two) times daily. 10 capsule 0  . Dulaglutide (TRULICITY) 1.5 WG/6.6ZL SOPN INJECT 1 PEN INTO THE SKIN WEEKLY 12 mL 3  . glucose blood (ACCU-CHEK GUIDE) test strip Use as instructed 100 each 3  . Insulin Pen Needle 31G X 8 MM MISC 1 each by Does not apply route daily as needed. 100 each 11  . levothyroxine (SYNTHROID, LEVOTHROID) 125 MCG tablet Take 1 tablet (125 mcg total) by mouth daily. 90 tablet 2   No current facility-administered medications on file prior to visit.     ROS Review of Systems  Constitutional: Negative.   HENT: Positive for hearing loss. Negative for congestion.   Eyes: Negative for visual disturbance.  Respiratory: Negative for shortness of breath.   Cardiovascular: Negative for chest pain.  Gastrointestinal: Negative for abdominal pain, constipation, diarrhea, nausea and vomiting.  Genitourinary: Negative for difficulty urinating.  Musculoskeletal: Negative for arthralgias and myalgias.  Neurological: Negative for headaches.  Psychiatric/Behavioral: Negative for sleep disturbance.    Objective:  BP 133/69   Pulse 73  Temp 98.4 F (36.9 C) (Oral)   Ht 4' 11" (1.499 m)   Wt 171 lb 8 oz (77.8 kg)   LMP 03/02/2004   BMI 34.64 kg/m   BP Readings from Last 3 Encounters:  09/03/18 133/69  06/03/18 132/75  03/19/18 133/63    Wt Readings from Last 3 Encounters:  09/03/18  171 lb 8 oz (77.8 kg)  06/03/18 170 lb 5.1 oz (77.3 kg)  03/19/18 170 lb 3.2 oz (77.2 kg)     Physical Exam Constitutional:      General: She is not in acute distress.    Appearance: She is well-developed.  HENT:     Head: Normocephalic and atraumatic.     Right Ear: External ear normal.     Left Ear: External ear normal.     Nose: Nose normal.  Eyes:     Conjunctiva/sclera: Conjunctivae normal.     Pupils: Pupils are equal, round, and reactive to light.  Neck:     Musculoskeletal: Normal range of motion and neck supple.     Thyroid: No thyromegaly.  Cardiovascular:     Rate and Rhythm: Normal rate and regular rhythm.     Heart sounds: Normal heart sounds. No murmur.  Pulmonary:     Effort: Pulmonary effort is normal. No respiratory distress.     Breath sounds: Normal breath sounds. No wheezing or rales.  Abdominal:     General: Bowel sounds are normal. There is no distension.     Palpations: Abdomen is soft.     Tenderness: There is no abdominal tenderness.  Lymphadenopathy:     Cervical: No cervical adenopathy.  Skin:    General: Skin is warm and dry.  Neurological:     Mental Status: She is alert and oriented to person, place, and time.     Deep Tendon Reflexes: Reflexes are normal and symmetric.  Psychiatric:        Behavior: Behavior normal.        Thought Content: Thought content normal.        Judgment: Judgment normal.     Results for orders placed or performed in visit on 09/02/18  T4 AND TSH  Result Value Ref Range   TSH 1.180 0.450 - 4.500 uIU/mL   T4, Total 13.4 (H) 4.5 - 12.0 ug/dL  Lipid panel  Result Value Ref Range   Cholesterol, Total 198 100 - 199 mg/dL   Triglycerides 176 (H) 0 - 149 mg/dL   HDL 45 >39 mg/dL   VLDL Cholesterol Cal 35 5 - 40 mg/dL   LDL Calculated 118 (H) 0 - 99 mg/dL   Chol/HDL Ratio 4.4 0.0 - 4.4 ratio  CMP14+EGFR  Result Value Ref Range   Glucose 116 (H) 65 - 99 mg/dL   BUN 7 (L) 8 - 27 mg/dL   Creatinine, Ser 0.63  0.57 - 1.00 mg/dL   GFR calc non Af Amer 94 >59 mL/min/1.73   GFR calc Af Amer 109 >59 mL/min/1.73   BUN/Creatinine Ratio 11 (L) 12 - 28   Sodium 138 134 - 144 mmol/L   Potassium 4.0 3.5 - 5.2 mmol/L   Chloride 98 96 - 106 mmol/L   CO2 24 20 - 29 mmol/L   Calcium 8.7 8.7 - 10.3 mg/dL   Total Protein 6.6 6.0 - 8.5 g/dL   Albumin 4.5 3.6 - 4.8 g/dL   Globulin, Total 2.1 1.5 - 4.5 g/dL   Albumin/Globulin Ratio 2.1 1.2 - 2.2   Bilirubin Total 0.6  0.0 - 1.2 mg/dL   Alkaline Phosphatase 79 39 - 117 IU/L   AST 26 0 - 40 IU/L   ALT 26 0 - 32 IU/L  CBC with Differential/Platelet  Result Value Ref Range   WBC 6.1 3.4 - 10.8 x10E3/uL   RBC 4.98 3.77 - 5.28 x10E6/uL   Hemoglobin 13.5 11.1 - 15.9 g/dL   Hematocrit 41.6 34.0 - 46.6 %   MCV 84 79 - 97 fL   MCH 27.1 26.6 - 33.0 pg   MCHC 32.5 31.5 - 35.7 g/dL   RDW 13.5 11.7 - 15.4 %   Platelets 237 150 - 450 x10E3/uL   Neutrophils 57 Not Estab. %   Lymphs 33 Not Estab. %   Monocytes 6 Not Estab. %   Eos 2 Not Estab. %   Basos 1 Not Estab. %   Neutrophils Absolute 3.5 1.4 - 7.0 x10E3/uL   Lymphocytes Absolute 2.0 0.7 - 3.1 x10E3/uL   Monocytes Absolute 0.3 0.1 - 0.9 x10E3/uL   EOS (ABSOLUTE) 0.1 0.0 - 0.4 x10E3/uL   Basophils Absolute 0.1 0.0 - 0.2 x10E3/uL   Immature Granulocytes 1 Not Estab. %   Immature Grans (Abs) 0.0 0.0 - 0.1 x10E3/uL  Bayer DCA Hb A1c Waived  Result Value Ref Range   HB A1C (BAYER DCA - WAIVED) 6.8 <7.0 %       Assessment & Plan:   Morgan Schmidt was seen today for medical management of chronic issues.  Diagnoses and all orders for this visit:  Controlled type 2 diabetes mellitus with complication, with long-term current use of insulin ([place])  Hypothyroidism, unspecified type  Essential hypertension  Mixed diabetic hyperlipidemia associated with type 2 diabetes mellitus ([place])  Sensorineural hearing loss (SNHL) of both ears -     Ambulatory referral to Audiology  Other orders -     ezetimibe (ZETIA) 10  MG tablet; TAKE 1 TABLET BY MOUTH ONCE DAILY FOR CHOLESTEROL -     Insulin Glargine (LANTUS SOLOSTAR) 100 UNIT/ML Solostar Pen; Inject 110 Units into the skin daily after breakfast. -     metFORMIN (GLUCOPHAGE) 1000 MG tablet; TAKE 1 TABLET BY MOUTH 2 TIMES DAILY WITH A MEAL. -     olmesartan (BENICAR) 20 MG tablet; Take 1 tablet (20 mg total) by mouth daily. -     Pneumococcal conjugate vaccine 13-valent   I have discontinued Salomon Mast. Quiggle's methocarbamol. I am also having her maintain her Vitamin D, diclofenac sodium, glucose blood, levothyroxine, ACCU-CHEK FASTCLIX LANCETS, acetaminophen, docusate sodium, Dulaglutide, Fenofibric Acid, Insulin Pen Needle, ezetimibe, Insulin Glargine, metFORMIN, and olmesartan.  Meds ordered this encounter  Medications  . ezetimibe (ZETIA) 10 MG tablet    Sig: TAKE 1 TABLET BY MOUTH ONCE DAILY FOR CHOLESTEROL    Dispense:  90 tablet    Refill:  3  . Insulin Glargine (LANTUS SOLOSTAR) 100 UNIT/ML Solostar Pen    Sig: Inject 110 Units into the skin daily after breakfast.    Dispense:  5 pen    Refill:  3  . metFORMIN (GLUCOPHAGE) 1000 MG tablet    Sig: TAKE 1 TABLET BY MOUTH 2 TIMES DAILY WITH A MEAL.    Dispense:  180 tablet    Refill:  1  . olmesartan (BENICAR) 20 MG tablet    Sig: Take 1 tablet (20 mg total) by mouth daily.    Dispense:  90 tablet    Refill:  1     Follow-up: Return in about 3 months (around 12/03/2018).  Claretta Fraise, M.D.

## 2018-09-17 ENCOUNTER — Other Ambulatory Visit: Payer: Self-pay | Admitting: Family Medicine

## 2018-09-17 DIAGNOSIS — Z1231 Encounter for screening mammogram for malignant neoplasm of breast: Secondary | ICD-10-CM

## 2018-09-22 MED FILL — TRULICITY 1.5 MG/0.5 ML PEN: 1.5 | 28 days supply | Qty: 2 | Fill #0

## 2018-09-23 ENCOUNTER — Ambulatory Visit (INDEPENDENT_AMBULATORY_CARE_PROVIDER_SITE_OTHER): Payer: PPO | Admitting: Family Medicine

## 2018-09-23 ENCOUNTER — Encounter: Payer: Self-pay | Admitting: Family Medicine

## 2018-09-23 VITALS — BP 138/83 | HR 78 | Temp 97.7°F | Ht 59.0 in | Wt 174.0 lb

## 2018-09-23 DIAGNOSIS — Z0000 Encounter for general adult medical examination without abnormal findings: Secondary | ICD-10-CM
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
## 2018-10-01 MED FILL — LANTUS SOLOSTAR 100 UNITS/M: 100 | 28 days supply | Qty: 30 | Fill #10

## 2018-10-15 ENCOUNTER — Ambulatory Visit
Admission: RE | Admit: 2018-10-15 | Discharge: 2018-10-15 | Disposition: A | Discharge status: Home / Self Care | Payer: PPO | Source: Ambulatory Visit | Attending: Family Medicine | Admitting: Family Medicine

## 2018-10-15 DIAGNOSIS — Z1231 Encounter for screening mammogram for malignant neoplasm of breast: Secondary | ICD-10-CM | POA: Diagnosis not present

## 2018-10-22 MED FILL — TRULICITY 1.5 MG/0.5 ML PEN: 1.5 | 28 days supply | Qty: 2 | Fill #1 | Status: TO

## 2018-11-03 MED FILL — LANTUS SOLOSTAR 100 UNITS/M: 100 | 15 days supply | Qty: 15 | Fill #11 | Status: TO

## 2018-11-12 MED FILL — FENOFIBRIC ACID 135 MG CPDR: 135 | 90 days supply | Qty: 90 | Fill #0

## 2018-11-12 MED FILL — LEVOTHYROXINE 125 MCG TABLE: 125 | 90 days supply | Qty: 90 | Fill #0

## 2018-11-17 MED FILL — TRULICITY 1.5 MG/0.5 ML PEN: 1.5 | 28 days supply | Qty: 2 | Fill #0

## 2018-11-19 MED FILL — LANTUS SOLOSTAR 100 UNITS/M: 100 | 28 days supply | Qty: 30 | Fill #0

## 2018-11-19 MED FILL — UNIFINE PENTIPS 8MM 31G: 31G X 8 MM | 90 days supply | Qty: 100 | Fill #0

## 2018-12-05 ENCOUNTER — Encounter: Payer: Self-pay | Admitting: Family Medicine

## 2018-12-05 ENCOUNTER — Other Ambulatory Visit: Payer: Self-pay

## 2018-12-05 ENCOUNTER — Ambulatory Visit (INDEPENDENT_AMBULATORY_CARE_PROVIDER_SITE_OTHER): Payer: PPO | Admitting: Family Medicine

## 2018-12-05 DIAGNOSIS — E039 Hypothyroidism, unspecified: Secondary | ICD-10-CM

## 2018-12-05 DIAGNOSIS — E038 Other specified hypothyroidism: Secondary | ICD-10-CM | POA: Diagnosis not present

## 2018-12-05 MED ORDER — LEVOTHYROXINE SODIUM 125 MCG PO TABS: 125.0000 ug | ORAL_TABLET | Freq: Every day | ORAL | 2 refills | Status: DC

## 2018-12-05 MED ORDER — INSULIN GLARGINE 100 UNIT/ML SOLOSTAR PEN: 110.0000 [IU] | PEN_INJECTOR | Freq: Every day | SUBCUTANEOUS | 3 refills | Status: DC

## 2018-12-05 MED ORDER — DICLOFENAC SODIUM 1 % TD GEL: 2.0000 g | TRANSDERMAL | 11 refills | Status: AC

## 2018-12-05 MED FILL — DICLOFENAC SODIUM 1 % GEL: 1 | 6 days supply | Qty: 100 | Fill #0

## 2018-12-05 NOTE — Progress Notes (Signed)
Subjective:  [name], female    DOB: June 17, 1951  Age: 68 y.o. MRN: [id number]  CC: No chief complaint on file.   HPI MERVE HOTARD presents forFollow-up of diabetes. Patient checks blood sugar at home.   90-115 fasting and 150-200 depending on what she ate postprandial Patient denies symptoms such as polyuria, polydipsia, excessive hunger, nausea No significant hypoglycemic spells noted. Medications reviewed. Pt reports taking them regularly without complication/adverse reaction being reported today.  Checking feet daily. Last eye appt was canceled due COVID a month ago. Rescheduled.   History Novalyn has a past medical history of Arthritis, Cancer ([place]), Diabetes mellitus, Hip fracture ([place]), and Thyroid disease.   She has a past surgical history that includes Thyroidectomy (2009); Cesarean section; Knee arthroscopy (Left, 10/22/2016); Knee arthroscopy with medial menisectomy (10/22/2016); Knee arthroscopy with subchondroplasty (Left, 10/22/2016); and Hip pinning, cannulated (Left, 03/10/2018).   Her family history is not on file.She reports that she quit smoking about 18 years ago. She has never used smokeless tobacco. She reports that she does not drink alcohol or use drugs.  Current Outpatient Medications on File Prior to Visit  Medication Sig Dispense Refill  . ACCU-CHEK FASTCLIX LANCETS MISC Use to check Blood Sugars twice daily. 102 each 3  . acetaminophen (TYLENOL) 325 MG tablet Take 325-650 mg by mouth every 6 (six) hours as needed (for pain or headaches).    . Cholecalciferol (VITAMIN D) 2000 UNITS tablet Take 1 tablet (2,000 Units total) by mouth daily. (Patient taking differently: Take 2,000 Units by mouth every evening. ) 30 tablet 11  . Choline Fenofibrate (FENOFIBRIC ACID) 135 MG CPDR Take 135 mg by mouth daily. 90 capsule 3  . docusate sodium (COLACE) 100 MG capsule Take 1 capsule (100 mg total) by mouth 2 (two) times daily. 10 capsule 0  . Dulaglutide  (TRULICITY) 1.5 AS/3.4HD SOPN INJECT 1 PEN INTO THE SKIN WEEKLY 12 mL 3  . ezetimibe (ZETIA) 10 MG tablet TAKE 1 TABLET BY MOUTH ONCE DAILY FOR CHOLESTEROL 90 tablet 3  . glucose blood (ACCU-CHEK GUIDE) test strip Use as instructed 100 each 3  . Insulin Pen Needle 31G X 8 MM MISC 1 each by Does not apply route daily as needed. 100 each 11  . metFORMIN (GLUCOPHAGE) 1000 MG tablet TAKE 1 TABLET BY MOUTH 2 TIMES DAILY WITH A MEAL. 180 tablet 1  . olmesartan (BENICAR) 20 MG tablet Take 1 tablet (20 mg total) by mouth daily. 90 tablet 1   No current facility-administered medications on file prior to visit.     ROS Review of Systems  Objective:  LMP 03/02/2004   BP Readings from Last 3 Encounters:  09/23/18 138/83  09/03/18 133/69  06/03/18 132/75    Wt Readings from Last 3 Encounters:  09/23/18 174 lb (78.9 kg)  09/03/18 171 lb 8 oz (77.8 kg)  06/03/18 170 lb 5.1 oz (77.3 kg)     Physical Exam    Assessment & Plan:   Diagnoses and all orders for this visit:  Other specified hypothyroidism  Hypothyroidism, unspecified type -     levothyroxine (SYNTHROID) 125 MCG tablet; Take 1 tablet (125 mcg total) by mouth daily.  Other orders -     diclofenac sodium (VOLTAREN) 1 % GEL; Apply 2-4 g topically See admin instructions. Apply 2-4 grams to each hand up to 4 times a day as needed for arthritis in the fingers -     Insulin Glargine (LANTUS SOLOSTAR) 100  UNIT/ML Solostar Pen; Inject 110 Units into the skin daily after breakfast.      I have changed Salomon Mast. [place]'s levothyroxine. I am also having her maintain her Vitamin D, glucose blood, Accu-Chek FastClix Lancets, acetaminophen, docusate sodium, Dulaglutide, Fenofibric Acid, Insulin Pen Needle, ezetimibe, metFORMIN, olmesartan, diclofenac sodium, and Insulin Glargine.  Meds ordered this encounter  Medications  . diclofenac sodium (VOLTAREN) 1 % GEL    Sig: Apply 2-4 g topically See admin instructions. Apply 2-4 grams to  each hand up to 4 times a day as needed for arthritis in the fingers    Dispense:  100 g    Refill:  11  . Insulin Glargine (LANTUS SOLOSTAR) 100 UNIT/ML Solostar Pen    Sig: Inject 110 Units into the skin daily after breakfast.    Dispense:  5 pen    Refill:  3  . levothyroxine (SYNTHROID) 125 MCG tablet    Sig: Take 1 tablet (125 mcg total) by mouth daily.    Dispense:  90 tablet    Refill:  2  Virtual Visit via telephone Note  I discussed the limitations, risks, security and privacy concerns of performing an evaluation and management service by telephone and the availability of in person appointments. I also discussed with the patient that there may be a patient responsible charge related to this service. The patient expressed understanding and agreed to proceed. Pt. Is at home. Dr. Livia Snellen is in his office.  Follow Up Instructions:   I discussed the assessment and treatment plan with the patient. The patient was provided an opportunity to ask questions and all were answered. The patient agreed with the plan and demonstrated an understanding of the instructions.   The patient was advised to call back or seek an in-person evaluation if the symptoms worsen or if the condition fails to improve as anticipated.  Visit started: 8:20 Call ended:  8:40 Total minutes including chart review and phone contact time: 25    Follow-up: Return in about 3 months (around 03/06/2019).  Claretta Fraise, M.D.

## 2018-12-23 IMAGING — US US CAROTID DUPLEX BILAT
1 series · 13 of 24 positions shown · non-contrast
Comparison: None.

CLINICAL DATA: 66-year-old female with a history of bruit.

Cardiovascular risk factors include hypertension, hyperlipidemia,
tobacco use
EXAM:
BILATERAL CAROTID DUPLEX ULTRASOUND
TECHNIQUE: Gray scale imaging, color Doppler and duplex ultrasound were
performed of bilateral carotid and vertebral arteries in the neck.

[Series 1: us carotid duplex bilat · 0.06mm/px · 13 of 71 slices shown]
[im 1/71]
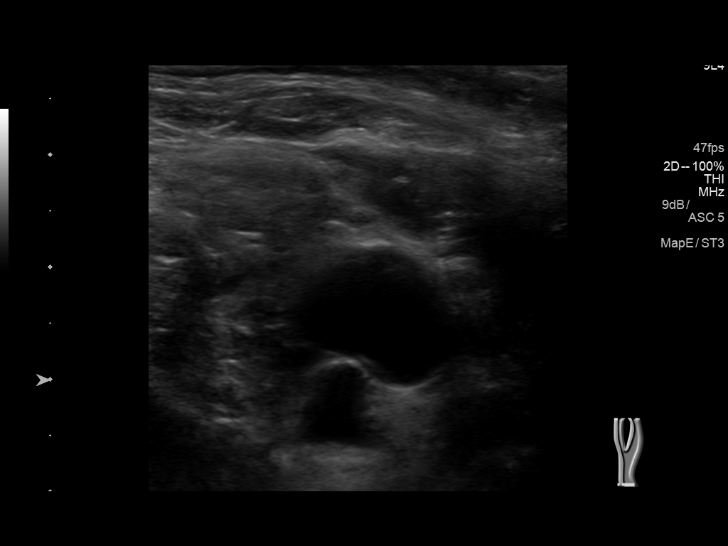
[im 7/71]
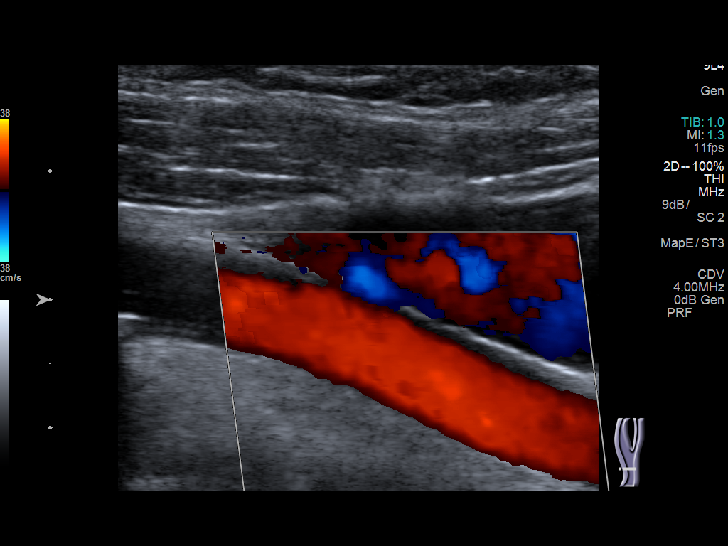
[im 13/71]
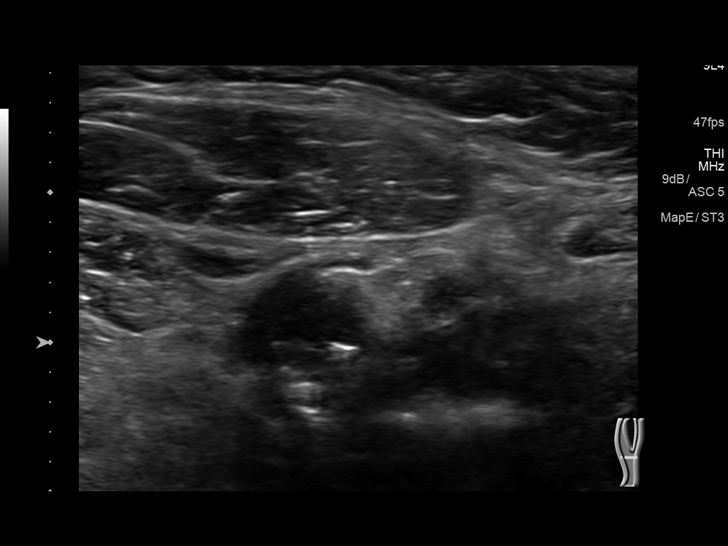
[im 19/71]
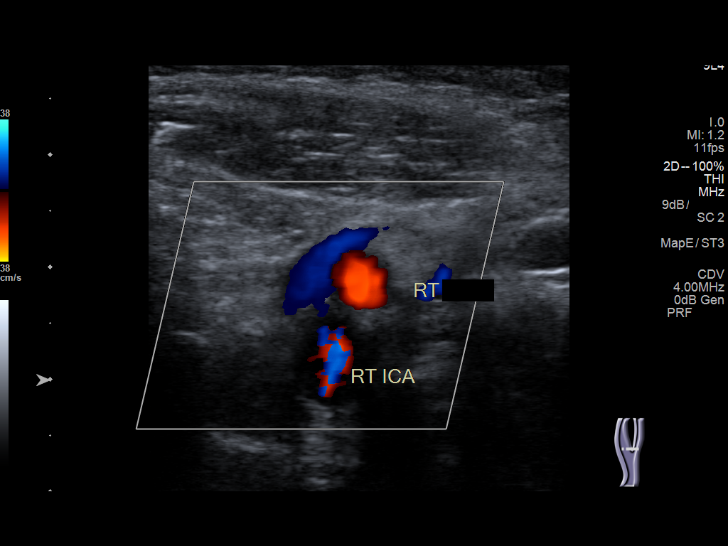
[im 25/71]
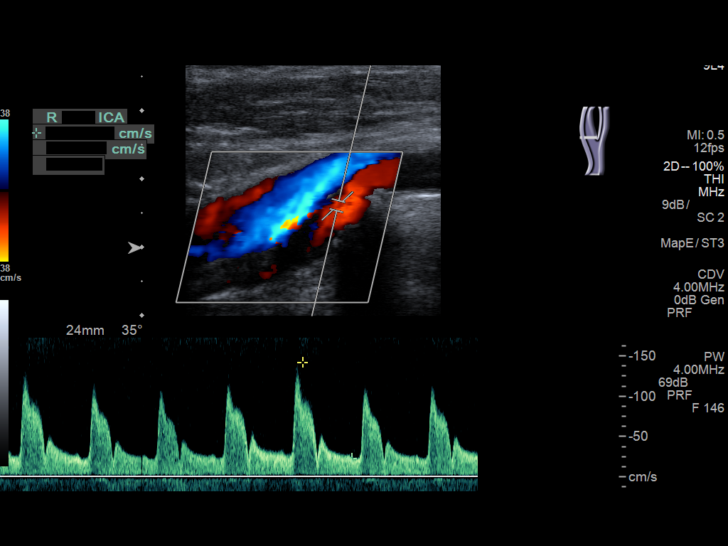
[im 31/71]
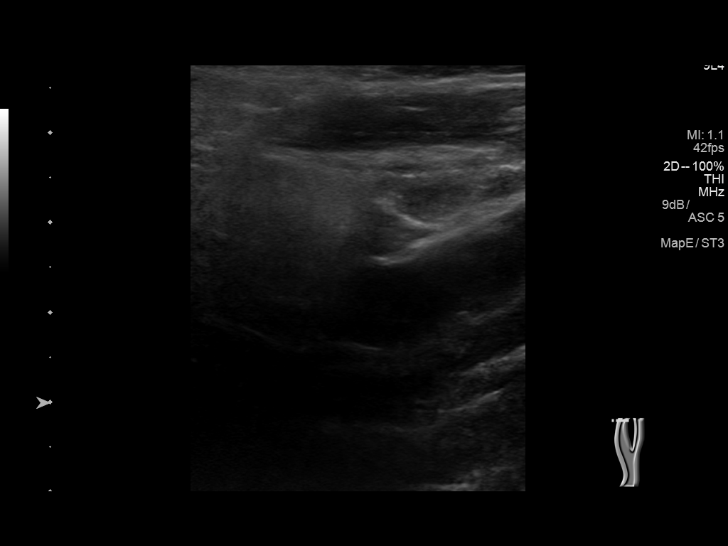
[im 37/71]
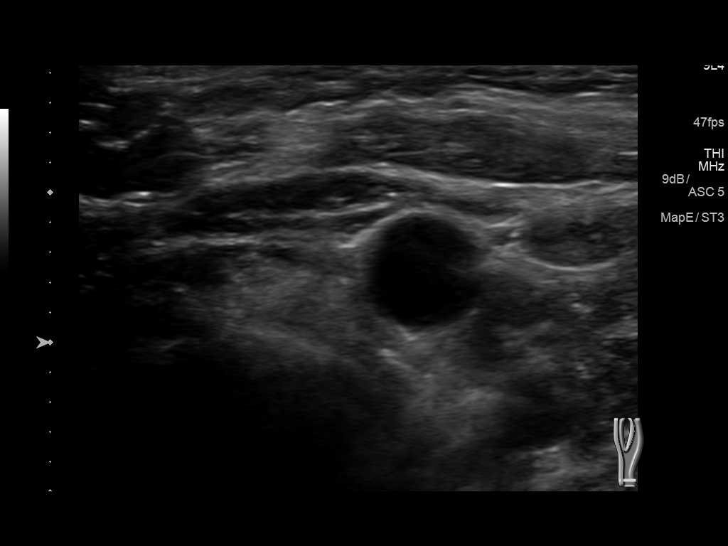
[im 40/71]
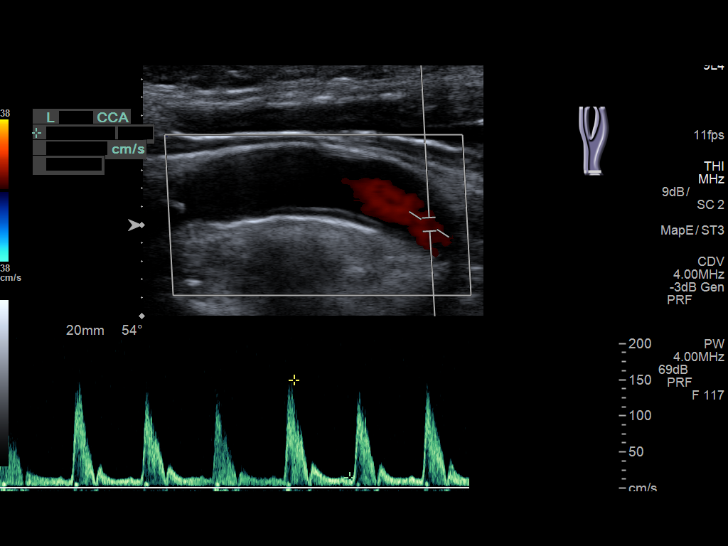
[im 46/71]
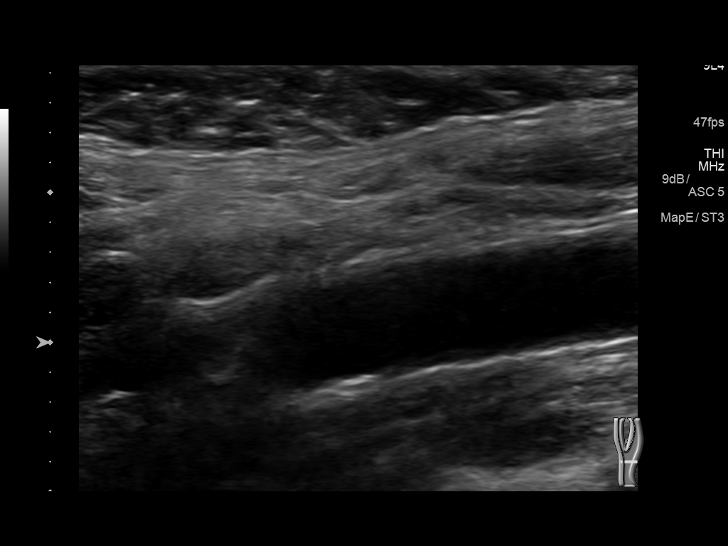
[im 52/71]
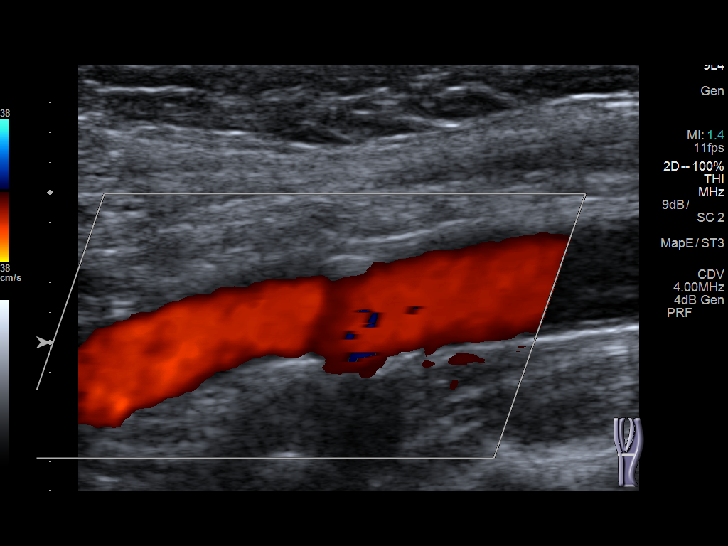
[im 58/71]
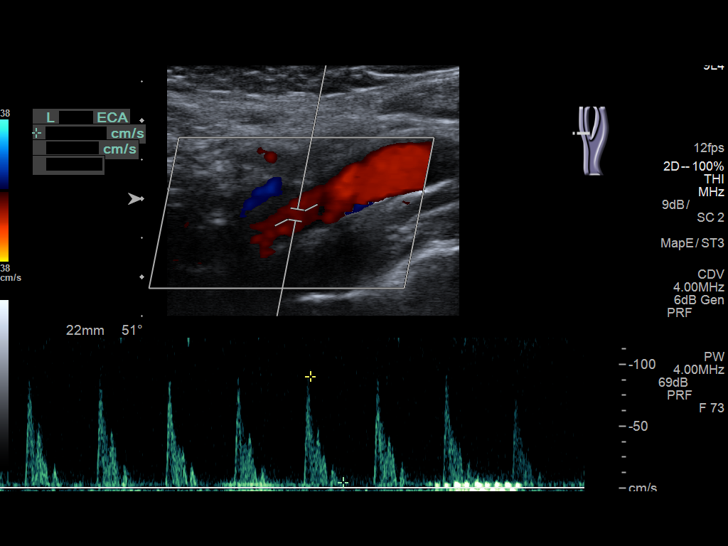
[im 64/71]
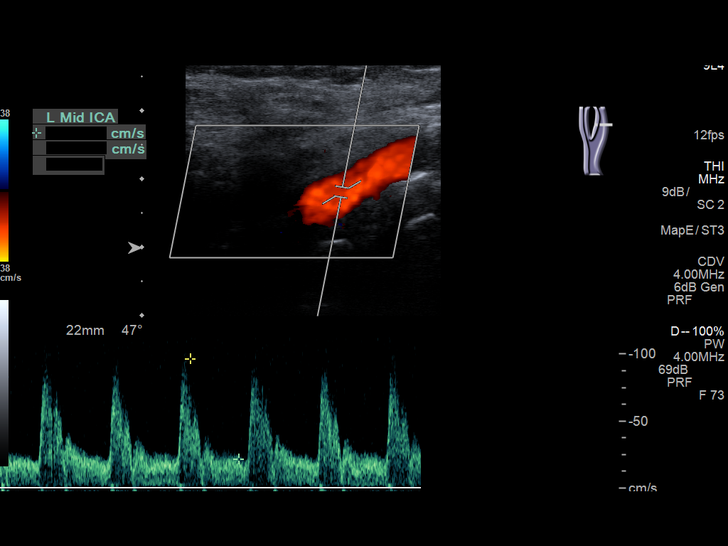
[im 71/71]
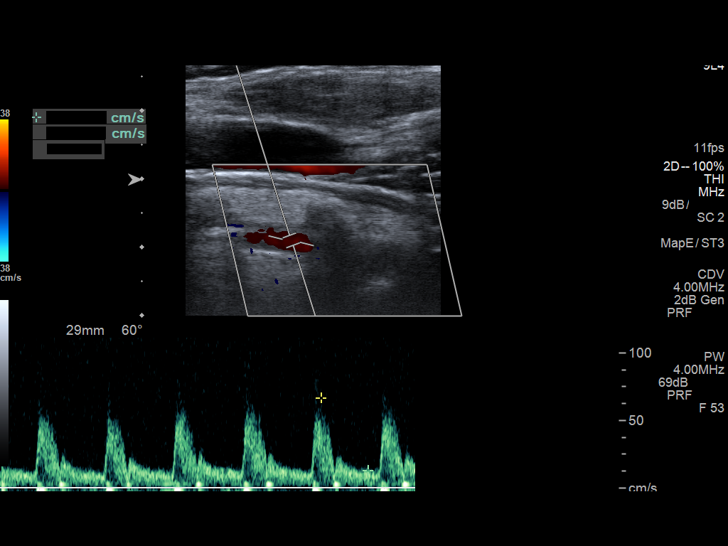

[13 of 24 positions shown; findings below may reference images not displayed]

FINDINGS: Criteria: Quantification of carotid stenosis is based on velocity
parameters that correlate the residual internal carotid diameter
with NASCET-based stenosis levels, using the diameter of the distal
internal carotid lumen as the denominator for stenosis measurement.

The following velocity measurements were obtained:

RIGHT

ICA:  Systolic 142 cm/sec, Diastolic 23 cm/sec

CCA:  99 cm/sec

SYSTOLIC ICA/CCA RATIO:

ECA:  108 cm/sec

LEFT

ICA:  Systolic 97 cm/sec, Diastolic 22 cm/sec

CCA:  109 cm/sec

SYSTOLIC ICA/CCA RATIO:

ECA:  90 cm/sec

Right Brachial SBP: Not acquired

Left Brachial SBP: Not acquired

RIGHT CAROTID ARTERY: No significant calcifications of the right
common carotid artery. Intermediate waveform maintained.
Heterogeneous and partially calcified plaque at the right carotid
bifurcation. No significant lumen shadowing. Low resistance waveform
of the right ICA. No significant tortuosity.

RIGHT VERTEBRAL ARTERY: Antegrade flow with low resistance waveform.

LEFT CAROTID ARTERY: No significant calcifications of the left
common carotid artery. Intermediate waveform maintained.
Heterogeneous and partially calcified plaque at the left carotid
bifurcation without significant lumen shadowing. Low resistance
waveform of the left ICA. No significant tortuosity.

LEFT VERTEBRAL ARTERY:  Antegrade flow with low resistance waveform.
IMPRESSION: Color duplex indicates minimal heterogeneous and calcified plaque,
with no hemodynamically significant stenosis by duplex criteria in
the extracranial cerebrovascular circulation.

## 2018-12-26 MED FILL — LANTUS SOLOSTAR 100 UNITS/M: 100 | 14 days supply | Qty: 15 | Fill #0 | Status: TO

## 2019-01-15 MED FILL — LANTUS SOLOSTAR 100 UNITS/M: 100 | 14 days supply | Qty: 15 | Fill #0

## 2019-01-16 ENCOUNTER — Other Ambulatory Visit: Payer: Self-pay | Admitting: Family Medicine

## 2019-01-16 NOTE — Telephone Encounter (Signed)
None available, pt aware 

## 2019-01-23 MED FILL — TRULICITY 1.5 MG/0.5 ML PEN: 1.5 | 28 days supply | Qty: 2 | Fill #1

## 2019-02-03 MED FILL — LEVOTHYROXINE 125 MCG TAB: 125 | 90 days supply | Qty: 90 | Fill #0

## 2019-02-03 MED FILL — LANTUS SOLOSTAR 100 UNITS/M: 100 | 14 days supply | Qty: 15 | Fill #0 | Status: TO

## 2019-02-06 ENCOUNTER — Other Ambulatory Visit: Payer: Self-pay | Admitting: Family Medicine

## 2019-02-06 MED ORDER — ROSUVASTATIN CALCIUM 10 MG PO TABS: 10.0000 mg | ORAL_TABLET | Freq: Every day | ORAL | 1 refills | Status: DC

## 2019-02-06 MED FILL — ROSUVASTATIN CALCIUM 10 MG: 10 | 90 days supply | Qty: 90 | Fill #0

## 2019-02-10 ENCOUNTER — Telehealth: Payer: Self-pay | Admitting: Family Medicine

## 2019-02-10 ENCOUNTER — Encounter: Payer: Self-pay | Admitting: Family Medicine

## 2019-02-10 NOTE — Telephone Encounter (Signed)
I spoke with pt. About concerns for MI & CVA in pt.s with diabetes. Risk is much decreased with Crestor. She had tried lipitor in remote past and did not tolerate it. Crestor is unique in that it is water soluble, so has fewer side effects. Pt agreed to give Crestor a try. WS

## 2019-02-10 NOTE — Telephone Encounter (Signed)
No lab comments

## 2019-02-17 MED FILL — LANTUS SOLOSTAR 100 UNITS/M: 100 | 14 days supply | Qty: 15 | Fill #0

## 2019-03-06 ENCOUNTER — Telehealth: Payer: Self-pay | Admitting: Family Medicine

## 2019-03-06 IMAGING — MG DIGITAL SCREENING BILATERAL MAMMOGRAM WITH TOMO AND CAD
8 series · 8 of 24 positions shown · non-contrast
Comparison: Previous exam(s).

CLINICAL DATA: Screening.

EXAM:
DIGITAL SCREENING BILATERAL MAMMOGRAM WITH TOMO AND CAD

[R MLO synth-2D]
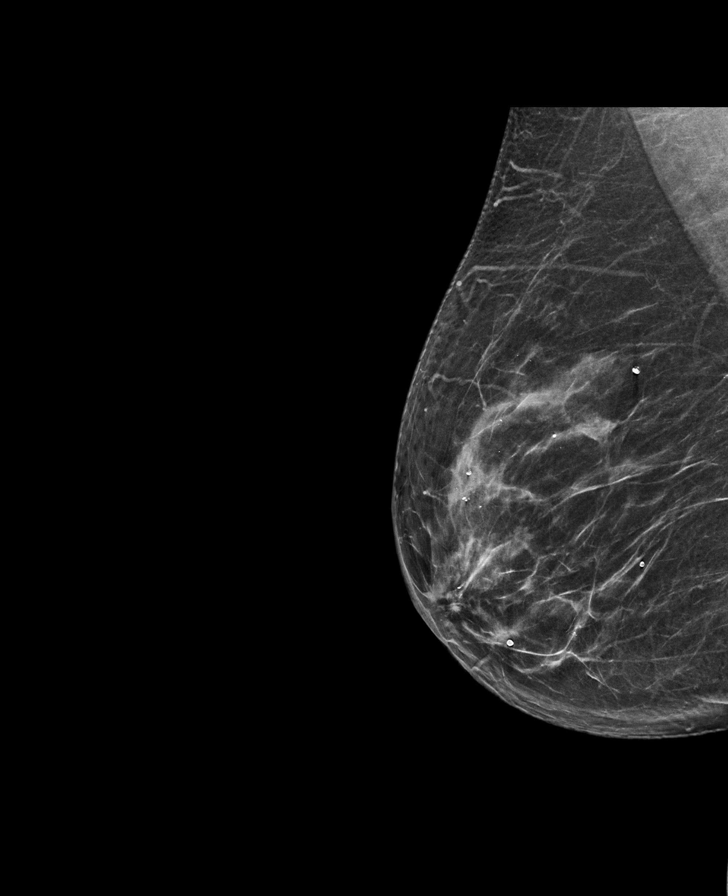

[R CC synth-2D]
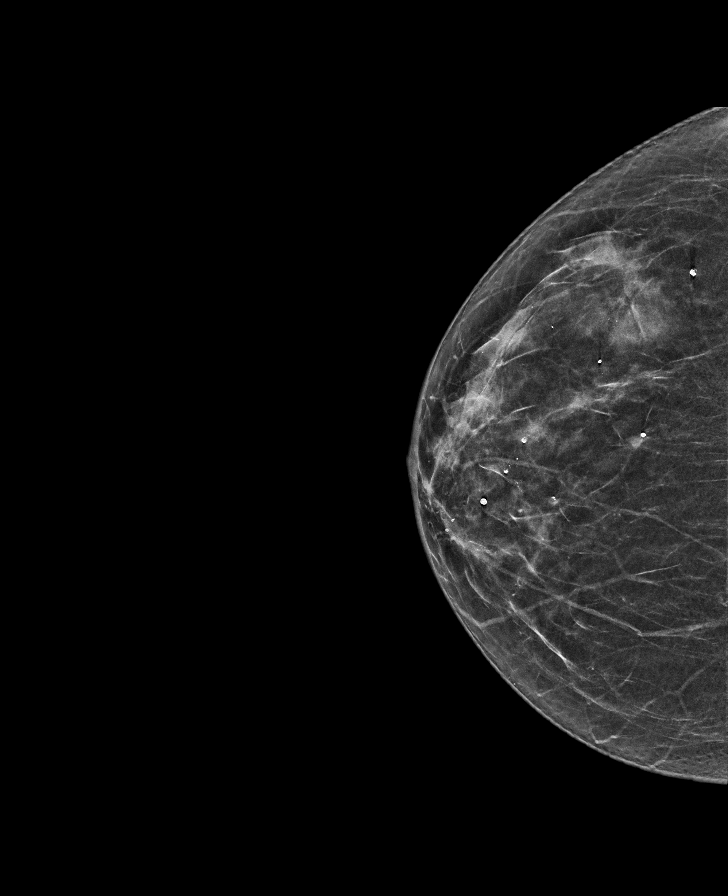

[L CC synth-2D]
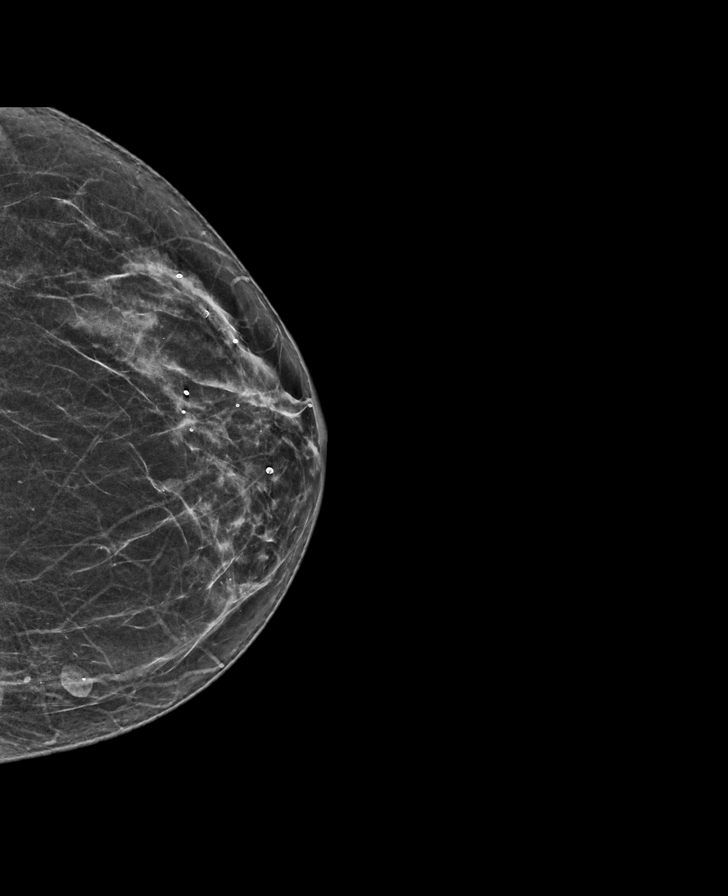

[L MLO synth-2D]
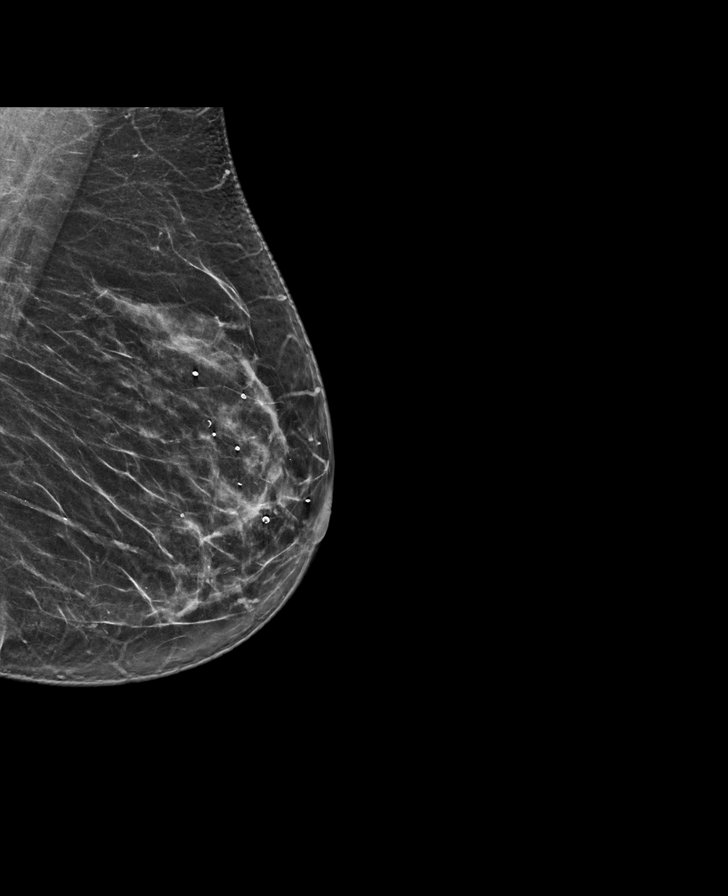

[R MLO tomo · tomo slice 34/67.0]
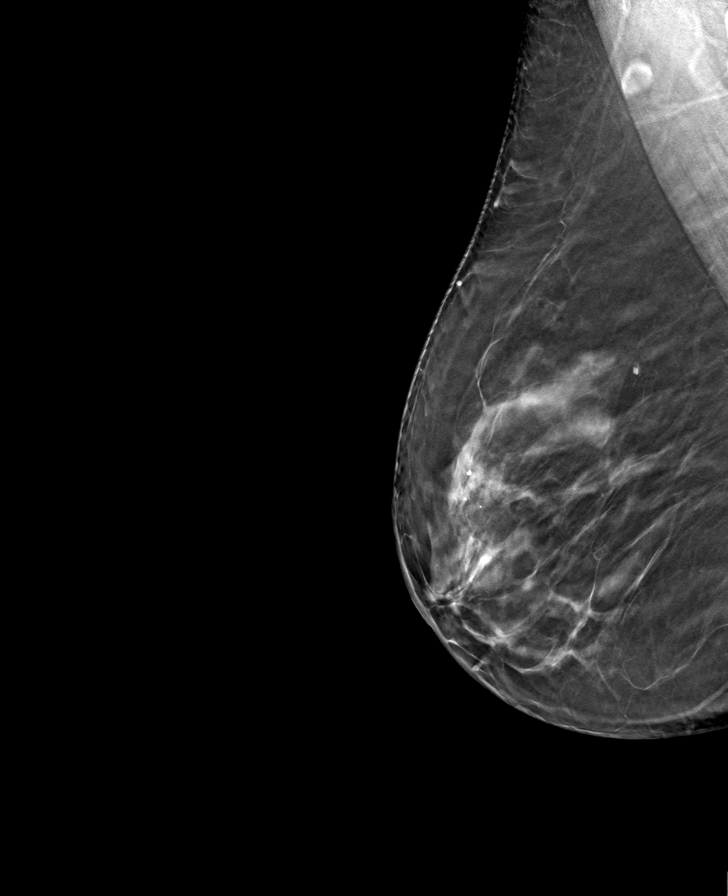

[R CC tomo · tomo slice 29/57.0]
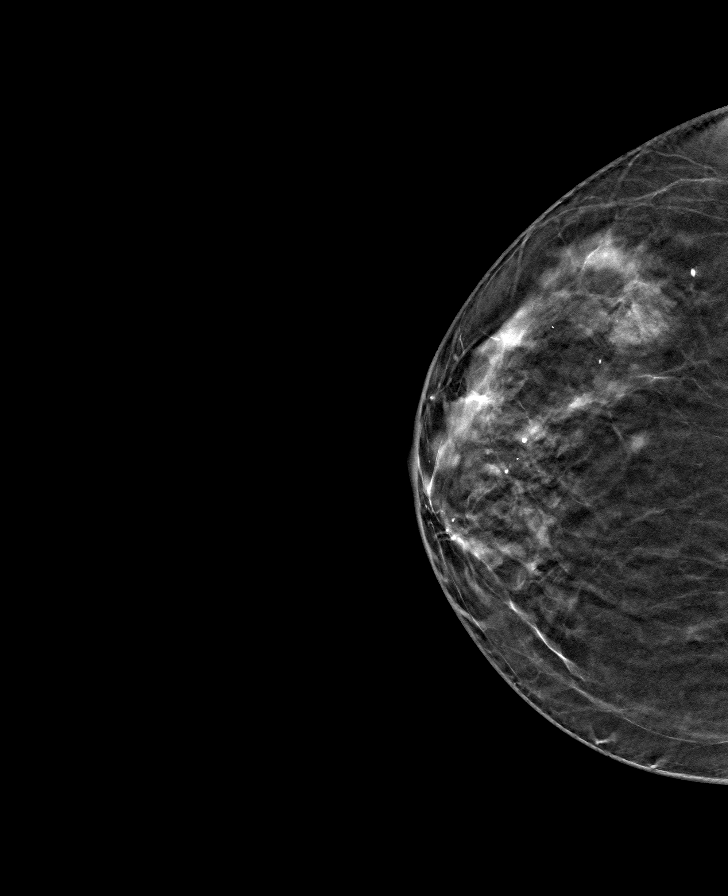

[L CC tomo · tomo slice 31/60.0]
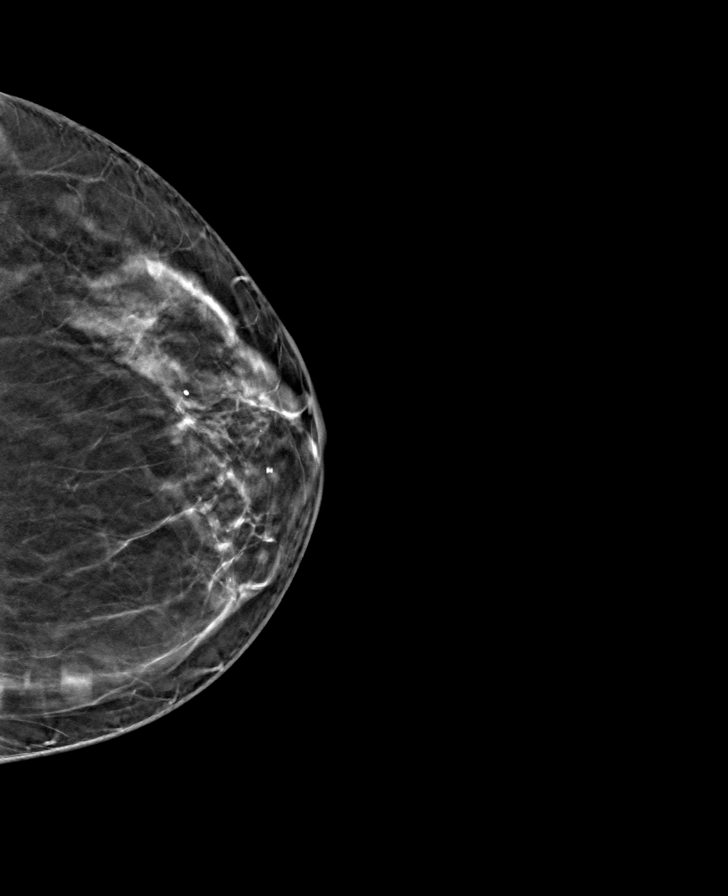

[L MLO tomo · tomo slice 35/68.0]
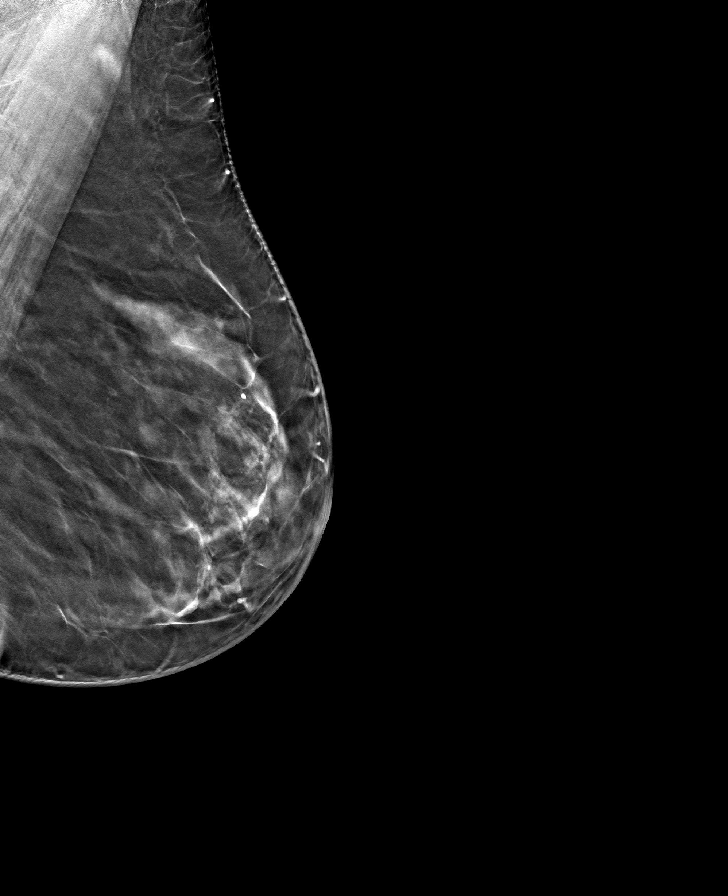

[8 of 24 positions shown; findings below may reference images not displayed]

ACR Breast Density Category b: There are scattered areas of
fibroglandular density.
FINDINGS: There are no findings suspicious for malignancy. Images were
processed with CAD.
IMPRESSION: No mammographic evidence of malignancy. A result letter of this
screening mammogram will be mailed directly to the patient.

RECOMMENDATION:
Screening mammogram in one year. (Code:[id number])

BI-RADS CATEGORY  1: Negative.

## 2019-03-06 NOTE — Telephone Encounter (Signed)
Patient is asking for Trulicity samples.

## 2019-03-06 NOTE — Telephone Encounter (Signed)
Aware.  No trulicity samples available.  She does not want any refills.  She will get samples from the [facility].

## 2019-03-10 MED FILL — TRULICITY 1.5 MG/0.5 ML PEN: 1.5 | 28 days supply | Qty: 2 | Fill #0

## 2019-03-20 MED FILL — UNIFINE PENTIPS 8MM 31G: 31G X 8 MM | 90 days supply | Qty: 100 | Fill #1

## 2019-03-24 ENCOUNTER — Ambulatory Visit: Payer: PPO | Admitting: Family Medicine

## 2019-04-13 ENCOUNTER — Other Ambulatory Visit: Payer: Self-pay

## 2019-04-13 ENCOUNTER — Other Ambulatory Visit: Payer: PPO

## 2019-04-13 DIAGNOSIS — E782 Mixed hyperlipidemia: Secondary | ICD-10-CM | POA: Diagnosis not present

## 2019-04-13 DIAGNOSIS — E038 Other specified hypothyroidism: Secondary | ICD-10-CM

## 2019-04-13 DIAGNOSIS — E1169 Type 2 diabetes mellitus with other specified complication: Secondary | ICD-10-CM

## 2019-04-13 LAB — BAYER DCA HB A1C WAIVED: HB A1C (BAYER DCA - WAIVED): 8 % — ABNORMAL HIGH (ref <7.0)

## 2019-04-14 ENCOUNTER — Encounter: Payer: Self-pay | Admitting: Family Medicine

## 2019-04-14 ENCOUNTER — Ambulatory Visit (INDEPENDENT_AMBULATORY_CARE_PROVIDER_SITE_OTHER): Payer: PPO | Admitting: Family Medicine

## 2019-04-14 VITALS — BP 140/62 | HR 77 | Temp 99.3°F | Ht 59.0 in | Wt 169.0 lb

## 2019-04-14 DIAGNOSIS — E038 Other specified hypothyroidism: Secondary | ICD-10-CM | POA: Diagnosis not present

## 2019-04-14 DIAGNOSIS — E039 Hypothyroidism, unspecified: Secondary | ICD-10-CM | POA: Diagnosis not present

## 2019-04-14 DIAGNOSIS — E782 Mixed hyperlipidemia: Secondary | ICD-10-CM | POA: Diagnosis not present

## 2019-04-14 DIAGNOSIS — I10 Essential (primary) hypertension: Secondary | ICD-10-CM

## 2019-04-14 DIAGNOSIS — E1169 Type 2 diabetes mellitus with other specified complication: Secondary | ICD-10-CM | POA: Diagnosis not present

## 2019-04-14 LAB — CBC WITH DIFFERENTIAL/PLATELET
Basophils Absolute: 0.1 10*3/uL (ref 0.0–0.2)
Basos: 1 %
EOS (ABSOLUTE): 0.1 10*3/uL (ref 0.0–0.4)
Eos: 3 %
Hematocrit: 38.7 % (ref 34.0–46.6)
Hemoglobin: 12.8 g/dL (ref 11.1–15.9)
Immature Grans (Abs): 0 10*3/uL (ref 0.0–0.1)
Immature Granulocytes: 0 %
Lymphocytes Absolute: 1.7 10*3/uL (ref 0.7–3.1)
Lymphs: 35 %
MCH: 27.2 pg (ref 26.6–33.0)
MCHC: 33.1 g/dL (ref 31.5–35.7)
MCV: 82 fL (ref 79–97)
Monocytes Absolute: 0.3 10*3/uL (ref 0.1–0.9)
Monocytes: 6 %
Neutrophils Absolute: 2.7 10*3/uL (ref 1.4–7.0)
Neutrophils: 55 %
Platelets: 212 10*3/uL (ref 150–450)
RBC: 4.71 x10E6/uL (ref 3.77–5.28)
RDW: 13.1 % (ref 11.7–15.4)
WBC: 5 10*3/uL (ref 3.4–10.8)

## 2019-04-14 LAB — CMP14+EGFR
ALT: 30 IU/L (ref 0–32)
AST: 50 IU/L — ABNORMAL HIGH (ref 0–40)
Albumin/Globulin Ratio: 2.7 — ABNORMAL HIGH (ref 1.2–2.2)
Albumin: 4.6 g/dL (ref 3.8–4.8)
Alkaline Phosphatase: 50 IU/L (ref 39–117)
BUN/Creatinine Ratio: 15 (ref 12–28)
BUN: 11 mg/dL (ref 8–27)
Bilirubin Total: 0.5 mg/dL (ref 0.0–1.2)
CO2: 22 mmol/L (ref 20–29)
Calcium: 8.8 mg/dL (ref 8.7–10.3)
Chloride: 102 mmol/L (ref 96–106)
Creatinine, Ser: 0.71 mg/dL (ref 0.57–1.00)
GFR calc Af Amer: 103 mL/min/{1.73_m2} (ref >59)
GFR calc non Af Amer: 89 mL/min/{1.73_m2} (ref >59)
Globulin, Total: 1.7 g/dL (ref 1.5–4.5)
Glucose: 93 mg/dL (ref 65–99)
Potassium: 3.8 mmol/L (ref 3.5–5.2)
Sodium: 140 mmol/L (ref 134–144)
Total Protein: 6.3 g/dL (ref 6.0–8.5)

## 2019-04-14 LAB — LIPID PANEL
Chol/HDL Ratio: 4.2 ratio (ref 0.0–4.4)
Cholesterol, Total: 157 mg/dL (ref 100–199)
HDL: 37 mg/dL — ABNORMAL LOW (ref >39)
LDL Calculated: 89 mg/dL (ref 0–99)
Triglycerides: 155 mg/dL — ABNORMAL HIGH (ref 0–149)
VLDL Cholesterol Cal: 31 mg/dL (ref 5–40)

## 2019-04-14 LAB — T4 AND TSH
T4, Total: 12.5 ug/dL — ABNORMAL HIGH (ref 4.5–12.0)
TSH: 0.857 u[IU]/mL (ref 0.450–4.500)

## 2019-04-14 MED ORDER — EZETIMIBE 10 MG PO TABS: ORAL_TABLET | ORAL | 1 refills | Status: DC

## 2019-04-14 MED ORDER — LEVOTHYROXINE SODIUM 125 MCG PO TABS: 125.0000 ug | ORAL_TABLET | Freq: Every day | ORAL | 1 refills | Status: DC

## 2019-04-14 MED ORDER — TRULICITY 1.5 MG/0.5ML ~~LOC~~ SOAJ: SUBCUTANEOUS | 3 refills | Status: DC

## 2019-04-14 MED ORDER — OLMESARTAN MEDOXOMIL 20 MG PO TABS: 20.0000 mg | ORAL_TABLET | Freq: Every day | ORAL | 1 refills | Status: DC

## 2019-04-14 MED ORDER — FENOFIBRIC ACID 135 MG PO CPDR: 135.0000 mg | DELAYED_RELEASE_CAPSULE | Freq: Every day | ORAL | 1 refills | Status: DC

## 2019-04-14 MED ORDER — METFORMIN HCL 1000 MG PO TABS: ORAL_TABLET | ORAL | 1 refills | Status: DC

## 2019-04-14 MED ORDER — ROSUVASTATIN CALCIUM 10 MG PO TABS: 10.0000 mg | ORAL_TABLET | Freq: Every day | ORAL | 1 refills | Status: DC

## 2019-04-14 MED FILL — FENOFIBRIC ACID 135 MG CPDR: 135 | 90 days supply | Qty: 90 | Fill #0

## 2019-04-14 MED FILL — TRULICITY 1.5 MG/0.5 ML PEN: 1.5 | 28 days supply | Qty: 2 | Fill #0

## 2019-04-14 MED FILL — OLMESARTAN MEDOXOMIL 20 MG: 20 | 90 days supply | Qty: 90 | Fill #0

## 2019-04-14 MED FILL — metFORMIN HCL 1000 MG TABS: 1000 | 90 days supply | Qty: 180 | Fill #0

## 2019-04-14 MED FILL — EZETIMIBE 10 MG TABS: 10 | 90 days supply | Qty: 90 | Fill #0

## 2019-04-14 NOTE — Addendum Note (Signed)
Addended by: Zannie Cove on: 04/14/2019 01:37 PM   Modules accepted: Orders

## 2019-04-14 NOTE — Progress Notes (Signed)
Subjective:  [name],  female    DOB: 1950/09/24  Age: 68 y.o.    CC: Medical Management of Chronic Issues (3 mo ), Hypertension, Hypothyroidism, Diabetes, and Hyperlipidemia   HPI [name] presents for  follow-up of hypertension. Patient has no history of headache chest pain or shortness of breath or recent cough. Patient also denies symptoms of TIA such as numbness weakness lateralizing. Patient denies side effects from medication. States taking it regularly.  Patient also  in for follow-up of elevated cholesterol. Doing well without complaints on current medication. Denies side effects  including myalgia and arthralgia and nausea. Also in today for liver function testing. Currently no chest pain, shortness of breath or other cardiovascular related symptoms noted.  Follow-up of diabetes. Patient does check blood sugar at home. Readings run between 99991111 Was out of trulicity for 3 weeks and had some higher readings Patient denies symptoms such as excessive hunger or urinary frequency, excessive hunger, nausea No significant hypoglycemic spells noted. Medications reviewed. Pt reports taking them regularly. Pt. denies complication/adverse reaction today.    History Larica has a past medical history of Arthritis, Cancer ([place]), Diabetes mellitus, Hip fracture ([place]), and Thyroid disease.   She has a past surgical history that includes Thyroidectomy (2009); Cesarean section; Knee arthroscopy (Left, 10/22/2016); Knee arthroscopy with medial menisectomy (10/22/2016); Knee arthroscopy with subchondroplasty (Left, 10/22/2016); and Hip pinning, cannulated (Left, 03/10/2018).   Her family history is not on file.She reports that she quit smoking about 18 years ago. She has never used smokeless tobacco. She reports that she does not drink alcohol or use drugs.  Current Outpatient Medications on File Prior to Visit  Medication Sig Dispense Refill  . ACCU-CHEK FASTCLIX LANCETS  MISC Use to check Blood Sugars twice daily. 102 each 3  . acetaminophen (TYLENOL) 325 MG tablet Take 325-650 mg by mouth every 6 (six) hours as needed (for pain or headaches).    . Cholecalciferol (VITAMIN D) 2000 UNITS tablet Take 1 tablet (2,000 Units total) by mouth daily. (Patient taking differently: Take 2,000 Units by mouth every evening. ) 30 tablet 11  . Choline Fenofibrate (FENOFIBRIC ACID) 135 MG CPDR Take 135 mg by mouth daily. 90 capsule 3  . diclofenac sodium (VOLTAREN) 1 % GEL Apply 2-4 g topically See admin instructions. Apply 2-4 grams to each hand up to 4 times a day as needed for arthritis in the fingers 100 g 11  . docusate sodium (COLACE) 100 MG capsule Take 1 capsule (100 mg total) by mouth 2 (two) times daily. 10 capsule 0  . Dulaglutide (TRULICITY) 1.5 0000000 SOPN INJECT 1 PEN INTO THE SKIN WEEKLY 12 mL 3  . ezetimibe (ZETIA) 10 MG tablet TAKE 1 TABLET BY MOUTH ONCE DAILY FOR CHOLESTEROL 90 tablet 3  . glucose blood (ACCU-CHEK GUIDE) test strip Use as instructed 100 each 3  . Insulin Glargine (LANTUS SOLOSTAR) 100 UNIT/ML Solostar Pen Inject 110 Units into the skin daily after breakfast. 5 pen 3  . Insulin Pen Needle 31G X 8 MM MISC 1 each by Does not apply route daily as needed. 100 each 11  . levothyroxine (SYNTHROID) 125 MCG tablet Take 1 tablet (125 mcg total) by mouth daily. 90 tablet 2  . metFORMIN (GLUCOPHAGE) 1000 MG tablet TAKE 1 TABLET BY MOUTH 2 TIMES DAILY WITH A MEAL. 180 tablet 1  . olmesartan (BENICAR) 20 MG tablet Take 1 tablet (20 mg total) by mouth daily. 90 tablet 1  .  rosuvastatin (CRESTOR) 10 MG tablet Take 1 tablet (10 mg total) by mouth daily. For cholesterol 90 tablet 1   No current facility-administered medications on file prior to visit.     ROS Review of Systems  Constitutional: Negative.   HENT: Negative for congestion.   Eyes: Negative for visual disturbance.  Respiratory: Negative for shortness of breath.   Cardiovascular: Negative for  chest pain.  Gastrointestinal: Negative for abdominal pain, constipation, diarrhea, nausea and vomiting.  Genitourinary: Negative for difficulty urinating.  Musculoskeletal: Negative for arthralgias and myalgias.  Neurological: Negative for headaches.  Psychiatric/Behavioral: Negative for sleep disturbance.    Objective:  BP 140/62 (BP Location: Right Arm, Cuff Size: Large)   Pulse 77   Temp 99.3 F (37.4 C)   Ht 4\' 11"  (1.499 m)   Wt 169 lb (76.7 kg)   LMP 03/02/2004   BMI 34.13 kg/m   BP Readings from Last 3 Encounters:  04/14/19 140/62  09/23/18 138/83  09/03/18 133/69    Wt Readings from Last 3 Encounters:  04/14/19 169 lb (76.7 kg)  09/23/18 174 lb (78.9 kg)  09/03/18 171 lb 8 oz (77.8 kg)     Physical Exam Constitutional:      General: She is not in acute distress.    Appearance: She is well-developed.  HENT:     Head: Normocephalic and atraumatic.     Right Ear: External ear normal.     Left Ear: External ear normal.     Nose: Nose normal.  Eyes:     Conjunctiva/sclera: Conjunctivae normal.     Pupils: Pupils are equal, round, and reactive to light.  Neck:     Musculoskeletal: Normal range of motion and neck supple.     Thyroid: No thyromegaly.  Cardiovascular:     Rate and Rhythm: Normal rate and regular rhythm.     Heart sounds: Normal heart sounds. No murmur.  Pulmonary:     Effort: Pulmonary effort is normal. No respiratory distress.     Breath sounds: Normal breath sounds. No wheezing or rales.  Abdominal:     General: Bowel sounds are normal. There is no distension.     Palpations: Abdomen is soft.     Tenderness: There is no abdominal tenderness.  Lymphadenopathy:     Cervical: No cervical adenopathy.  Skin:    General: Skin is warm and dry.  Neurological:     Mental Status: She is alert and oriented to person, place, and time.     Deep Tendon Reflexes: Reflexes are normal and symmetric.  Psychiatric:        Behavior: Behavior normal.         Thought Content: Thought content normal.        Judgment: Judgment normal.         Assessment & Plan:   Jadelin was seen today for medical management of chronic issues, hypertension, hypothyroidism, diabetes and hyperlipidemia.  Diagnoses and all orders for this visit:  Mixed diabetic hyperlipidemia associated with type 2 diabetes mellitus ([place])  Essential hypertension  Other specified hypothyroidism   I am having Salomon Mast. [place] maintain her Vitamin D, glucose blood, Accu-Chek FastClix Lancets, acetaminophen, docusate sodium, Dulaglutide, Fenofibric Acid, Insulin Pen Needle, ezetimibe, metFORMIN, olmesartan, diclofenac sodium, Insulin Glargine, levothyroxine, and rosuvastatin.  No orders of the defined types were placed in this encounter.    Follow-up: Return in about 3 months (around 07/15/2019).  Claretta Fraise, M.D.

## 2019-05-13 MED FILL — LEVOTHYROXINE 125 MCG TAB: 125 | 90 days supply | Qty: 90 | Fill #1

## 2019-07-08 ENCOUNTER — Other Ambulatory Visit: Payer: Self-pay

## 2019-07-09 ENCOUNTER — Ambulatory Visit (INDEPENDENT_AMBULATORY_CARE_PROVIDER_SITE_OTHER): Payer: PPO

## 2019-07-09 DIAGNOSIS — Z23 Encounter for immunization: Secondary | ICD-10-CM

## 2019-07-15 LAB — HM DIABETES EYE EXAM

## 2019-07-20 ENCOUNTER — Other Ambulatory Visit: Payer: Self-pay | Admitting: Family Medicine

## 2019-07-20 MED FILL — UNIFINE PENTIPS 8MM 31G: 31G X 8 MM | 90 days supply | Qty: 100 | Fill #0

## 2019-07-21 ENCOUNTER — Other Ambulatory Visit: Payer: Self-pay

## 2019-07-21 ENCOUNTER — Other Ambulatory Visit: Payer: PPO

## 2019-07-21 DIAGNOSIS — E1169 Type 2 diabetes mellitus with other specified complication: Secondary | ICD-10-CM

## 2019-07-21 DIAGNOSIS — E782 Mixed hyperlipidemia: Secondary | ICD-10-CM | POA: Diagnosis not present

## 2019-07-21 DIAGNOSIS — E038 Other specified hypothyroidism: Secondary | ICD-10-CM | POA: Diagnosis not present

## 2019-07-21 LAB — BAYER DCA HB A1C WAIVED: HB A1C (BAYER DCA - WAIVED): 6.9 % (ref <7.0)

## 2019-07-22 ENCOUNTER — Ambulatory Visit: Payer: PPO | Admitting: Family Medicine

## 2019-07-22 LAB — CBC WITH DIFFERENTIAL/PLATELET
Basophils Absolute: 0.1 10*3/uL (ref 0.0–0.2)
Basos: 1 %
EOS (ABSOLUTE): 0.2 10*3/uL (ref 0.0–0.4)
Eos: 3 %
Hematocrit: 39 % (ref 34.0–46.6)
Hemoglobin: 12.8 g/dL (ref 11.1–15.9)
Immature Grans (Abs): 0 10*3/uL (ref 0.0–0.1)
Immature Granulocytes: 1 %
Lymphocytes Absolute: 1.9 10*3/uL (ref 0.7–3.1)
Lymphs: 31 %
MCH: 27.1 pg (ref 26.6–33.0)
MCHC: 32.8 g/dL (ref 31.5–35.7)
MCV: 83 fL (ref 79–97)
Monocytes Absolute: 0.3 10*3/uL (ref 0.1–0.9)
Monocytes: 6 %
Neutrophils Absolute: 3.6 10*3/uL (ref 1.4–7.0)
Neutrophils: 58 %
Platelets: 224 10*3/uL (ref 150–450)
RBC: 4.72 x10E6/uL (ref 3.77–5.28)
RDW: 13.1 % (ref 11.7–15.4)
WBC: 6 10*3/uL (ref 3.4–10.8)

## 2019-07-22 LAB — T4 AND TSH
T4, Total: 13.6 ug/dL — ABNORMAL HIGH (ref 4.5–12.0)
TSH: 0.237 u[IU]/mL — ABNORMAL LOW (ref 0.450–4.500)

## 2019-07-22 LAB — CMP14+EGFR
ALT: 18 IU/L (ref 0–32)
AST: 26 IU/L (ref 0–40)
Albumin/Globulin Ratio: 2.3 — ABNORMAL HIGH (ref 1.2–2.2)
Albumin: 4.5 g/dL (ref 3.8–4.8)
Alkaline Phosphatase: 75 IU/L (ref 39–117)
BUN/Creatinine Ratio: 14 (ref 12–28)
BUN: 10 mg/dL (ref 8–27)
Bilirubin Total: 0.5 mg/dL (ref 0.0–1.2)
CO2: 24 mmol/L (ref 20–29)
Calcium: 8.6 mg/dL — ABNORMAL LOW (ref 8.7–10.3)
Chloride: 104 mmol/L (ref 96–106)
Creatinine, Ser: 0.74 mg/dL (ref 0.57–1.00)
GFR calc Af Amer: 98 mL/min/{1.73_m2} (ref >59)
GFR calc non Af Amer: 85 mL/min/{1.73_m2} (ref >59)
Globulin, Total: 2 g/dL (ref 1.5–4.5)
Glucose: 82 mg/dL (ref 65–99)
Potassium: 4.3 mmol/L (ref 3.5–5.2)
Sodium: 142 mmol/L (ref 134–144)
Total Protein: 6.5 g/dL (ref 6.0–8.5)

## 2019-07-22 LAB — LIPID PANEL
Chol/HDL Ratio: 3.1 ratio (ref 0.0–4.4)
Cholesterol, Total: 127 mg/dL (ref 100–199)
HDL: 41 mg/dL (ref >39)
LDL Chol Calc (NIH): 57 mg/dL (ref 0–99)
Triglycerides: 172 mg/dL — ABNORMAL HIGH (ref 0–149)
VLDL Cholesterol Cal: 29 mg/dL (ref 5–40)

## 2019-07-27 ENCOUNTER — Ambulatory Visit (INDEPENDENT_AMBULATORY_CARE_PROVIDER_SITE_OTHER): Payer: PPO | Admitting: Family Medicine

## 2019-07-27 ENCOUNTER — Encounter: Payer: Self-pay | Admitting: Family Medicine

## 2019-07-27 DIAGNOSIS — I10 Essential (primary) hypertension: Secondary | ICD-10-CM | POA: Diagnosis not present

## 2019-07-27 DIAGNOSIS — E782 Mixed hyperlipidemia: Secondary | ICD-10-CM | POA: Diagnosis not present

## 2019-07-27 DIAGNOSIS — Z794 Long term (current) use of insulin: Secondary | ICD-10-CM | POA: Diagnosis not present

## 2019-07-27 DIAGNOSIS — E118 Type 2 diabetes mellitus with unspecified complications: Secondary | ICD-10-CM

## 2019-07-27 DIAGNOSIS — E038 Other specified hypothyroidism: Secondary | ICD-10-CM

## 2019-07-27 DIAGNOSIS — E1169 Type 2 diabetes mellitus with other specified complication: Secondary | ICD-10-CM

## 2019-07-27 NOTE — Progress Notes (Signed)
    Subjective:    [name], female    DOB: 03-Feb-1951, 68 y.o.   MRN: [id number]   HPI: Morgan Schmidt is a 68 y.o. female presenting for presents forFollow-up of diabetes. Patient checks blood sugar at home.   150-62fasting and 190 postprandial Patient denies symptoms such as polyuria, polydipsia, excessive hunger, nausea No significant hypoglycemic spells noted. Medications reviewed. Pt reports taking them regularly without complication/adverse reaction being reported today.  Checking feet daily. Last eye appt was UTD    Depression screen [facility] 2/9 04/14/2019 09/23/2018 09/23/2018 09/03/2018 06/03/2018  Decreased Interest 0 0 0 0 0  Down, Depressed, Hopeless 0 0 0 0 0  PHQ - 2 Score 0 0 0 0 0     Relevant past medical, surgical, family and social history reviewed and updated as indicated.  Interim medical history since our last visit reviewed. Allergies and medications reviewed and updated.  ROS:  Review of Systems  Constitutional: Negative.   HENT: Negative for congestion.   Eyes: Negative for visual disturbance.  Respiratory: Negative for shortness of breath.   Cardiovascular: Negative for chest pain.  Gastrointestinal: Negative for abdominal pain, constipation, diarrhea, nausea and vomiting.  Genitourinary: Negative for difficulty urinating.  Musculoskeletal: Negative for arthralgias and myalgias.  Neurological: Negative for headaches.  Psychiatric/Behavioral: Negative for sleep disturbance.     Social History   Tobacco Use  Smoking Status Former Smoker  . Quit date: 08/23/2000  . Years since quitting: 18.9  Smokeless Tobacco Never Used       Objective:     Wt Readings from Last 3 Encounters:  04/14/19 169 lb (76.7 kg)  09/23/18 174 lb (78.9 kg)  09/03/18 171 lb 8 oz (77.8 kg)     Exam deferred. Pt. [place] due to COVID 19. Phone visit performed.   Assessment & Plan:   1. Controlled type 2 diabetes mellitus with complication, with  long-term current use of insulin ([place])   2. Other specified hypothyroidism   3. Essential hypertension   4. Mixed diabetic hyperlipidemia associated with type 2 diabetes mellitus ([place])     No orders of the defined types were placed in this encounter.   No orders of the defined types were placed in this encounter.     Diagnoses and all orders for this visit:  Controlled type 2 diabetes mellitus with complication, with long-term current use of insulin ([place])  Other specified hypothyroidism  Essential hypertension  Mixed diabetic hyperlipidemia associated with type 2 diabetes mellitus ([place])    Virtual Visit via telephone Note  I discussed the limitations, risks, security and privacy concerns of performing an evaluation and management service by telephone and the availability of in person appointments. The patient was identified with two identifiers. Pt.expressed understanding and agreed to proceed. Pt. Is at home. Dr. Livia Snellen is in his office.  Follow Up Instructions:   I discussed the assessment and treatment plan with the patient. The patient was provided an opportunity to ask questions and all were answered. The patient agreed with the plan and demonstrated an understanding of the instructions.   The patient was advised to call back or seek an in-person evaluation if the symptoms worsen or if the condition fails to improve as anticipated.   Total minutes including chart review and phone contact time: 21   Follow up plan: No follow-ups on file.  Claretta Fraise, MD [place]

## 2019-07-28 MED FILL — metFORMIN HCL 1000 MG TABS: 1000 | 90 days supply | Qty: 180 | Fill #0

## 2019-07-28 MED FILL — metFORMIN HCL 1000 MG TABS: 1000 | 90 days supply | Qty: 180 | Fill #0 | Status: TO

## 2019-08-11 MED FILL — LEVOTHYROXINE SODIUM 125 MC: 125 | 90 days supply | Qty: 90 | Fill #2

## 2019-08-11 MED FILL — OLMESARTAN MEDOXOMIL 20 MG: 20 | 90 days supply | Qty: 90 | Fill #0

## 2019-08-11 MED FILL — ROSUVASTATIN CALCIUM 10 MG: 10 | 90 days supply | Qty: 90 | Fill #0

## 2019-08-15 DIAGNOSIS — Z20828 Contact with and (suspected) exposure to other viral communicable diseases: Secondary | ICD-10-CM | POA: Diagnosis not present

## 2019-09-18 DIAGNOSIS — Z20822 Contact with and (suspected) exposure to covid-19: Secondary | ICD-10-CM | POA: Diagnosis not present

## 2019-09-18 DIAGNOSIS — Z20828 Contact with and (suspected) exposure to other viral communicable diseases: Secondary | ICD-10-CM | POA: Diagnosis not present

## 2019-09-18 DIAGNOSIS — Z03818 Encounter for observation for suspected exposure to other biological agents ruled out: Secondary | ICD-10-CM | POA: Diagnosis not present

## 2019-09-25 ENCOUNTER — Ambulatory Visit (INDEPENDENT_AMBULATORY_CARE_PROVIDER_SITE_OTHER): Payer: PPO | Admitting: *Deleted

## 2019-09-25 DIAGNOSIS — Z0000 Encounter for general adult medical examination without abnormal findings: Secondary | ICD-10-CM | POA: Diagnosis not present

## 2019-09-25 NOTE — Patient Instructions (Signed)
Preventive Care 38 Years and Older, Female Preventive care refers to lifestyle choices and visits with your health care provider that can promote health and wellness. This includes:  A yearly physical exam. This is also called an annual well check.  Regular dental and eye exams.  Immunizations.  Screening for certain conditions.  Healthy lifestyle choices, such as diet and exercise. What can I expect for my preventive care visit? Physical exam Your health care provider will check:  Height and weight. These may be used to calculate body mass index (BMI), which is a measurement that tells if you are at a healthy weight.  Heart rate and blood pressure.  Your skin for abnormal spots. Counseling Your health care provider may ask you questions about:  Alcohol, tobacco, and drug use.  Emotional well-being.  Home and relationship well-being.  Sexual activity.  Eating habits.  History of falls.  Memory and ability to understand (cognition).  Work and work Statistician.  Pregnancy and menstrual history. What immunizations do I need?  Influenza (flu) vaccine  This is recommended every year. Tetanus, diphtheria, and pertussis (Tdap) vaccine  You may need a Td booster every 10 years. Varicella (chickenpox) vaccine  You may need this vaccine if you have not already been vaccinated. Zoster (shingles) vaccine  You may need this after age 67. Pneumococcal conjugate (PCV13) vaccine  One dose is recommended after age 67. Pneumococcal polysaccharide (PPSV23) vaccine  One dose is recommended after age 72. Measles, mumps, and rubella (MMR) vaccine  You may need at least one dose of MMR if you were born in 1957 or later. You may also need a second dose. Meningococcal conjugate (MenACWY) vaccine  You may need this if you have certain conditions. Hepatitis A vaccine  You may need this if you have certain conditions or if you travel or work in places where you may be exposed  to hepatitis A. Hepatitis B vaccine  You may need this if you have certain conditions or if you travel or work in places where you may be exposed to hepatitis B. Haemophilus influenzae type b (Hib) vaccine  You may need this if you have certain conditions. You may receive vaccines as individual doses or as more than one vaccine together in one shot (combination vaccines). Talk with your health care provider about the risks and benefits of combination vaccines. What tests do I need? Blood tests  Lipid and cholesterol levels. These may be checked every 5 years, or more frequently depending on your overall health.  Hepatitis C test.  Hepatitis B test. Screening  Lung cancer screening. You may have this screening every year starting at age 67 if you have a 30-pack-year history of smoking and currently smoke or have quit within the past 15 years.  Colorectal cancer screening. All adults should have this screening starting at age 67 and continuing until age 15. Your health care provider may recommend screening at age 67 if you are at increased risk. You will have tests every 1-10 years, depending on your results and the type of screening test.  Diabetes screening. This is done by checking your blood sugar (glucose) after you have not eaten for a while (fasting). You may have this done every 1-3 years.  Mammogram. This may be done every 1-2 years. Talk with your health care provider about how often you should have regular mammograms.  BRCA-related cancer screening. This may be done if you have a family history of breast, ovarian, tubal, or peritoneal cancers.  Other tests  Sexually transmitted disease (STD) testing.  Bone density scan. This is done to screen for osteoporosis. You may have this done starting at age 67. Follow these instructions at home: Eating and drinking  Eat a diet that includes fresh fruits and vegetables, whole grains, lean protein, and low-fat dairy products. Limit  your intake of foods with high amounts of sugar, saturated fats, and salt.  Take vitamin and mineral supplements as recommended by your health care provider.  Do not drink alcohol if your health care provider tells you not to drink.  If you drink alcohol: ? Limit how much you have to 0-1 drink a day. ? Be aware of how much alcohol is in your drink. In the [place] one drink equals one 12 oz bottle of beer (355 mL), one 5 oz glass of wine (148 mL), or one 1 oz glass of hard liquor (44 mL). Lifestyle  Take daily care of your teeth and gums.  Stay active. Exercise for at least 30 minutes on 5 or more days each week.  Do not use any products that contain nicotine or tobacco, such as cigarettes, e-cigarettes, and chewing tobacco. If you need help quitting, ask your health care provider.  If you are sexually active, practice safe sex. Use a condom or other form of protection in order to prevent STIs (sexually transmitted infections).  Talk with your health care provider about taking a low-dose aspirin or statin. What's next?  Go to your health care provider once a year for a well check visit.  Ask your health care provider how often you should have your eyes and teeth checked.  Stay up to date on all vaccines. This information is not intended to replace advice given to you by your health care provider. Make sure you discuss any questions you have with your health care provider. Document Revised: 07/31/2018 Document Reviewed: 07/31/2018 Elsevier Patient Education  2020 [employer].

## 2019-09-25 NOTE — Progress Notes (Signed)
MEDICARE ANNUAL WELLNESS VISIT  09/25/2019  Telephone Visit Disclaimer This Medicare AWV was conducted by telephone due to national recommendations for restrictions regarding the COVID-19 Pandemic (e.g. social distancing).  I verified, using two identifiers, that I am speaking with Morgan Schmidt or their authorized healthcare agent. I discussed the limitations, risks, security, and privacy concerns of performing an evaluation and management service by telephone and the potential availability of an in-person appointment in the future. The patient expressed understanding and agreed to proceed.   Subjective:  Morgan Schmidt is a 69 y.o. female patient of Stacks, Cletus Gash, MD who had a Medicare Annual Wellness Visit today via telephone. Morgan Schmidt is Retired and lives with their partner. she has 3 children. she reports that she is socially active and does interact with friends/family regularly. she is moderately physically active and enjoys cooking and spending time with her grandson.  Patient Care Team: Claretta Fraise, MD as PCP - General (Family Medicine)  Advanced Directives 09/25/2019 03/09/2018 11/07/2016 10/22/2016 10/18/2016 06/11/2016 01/30/2016  Does Patient Have a Medical Advance Directive? No No No No No No No  Would patient like information on creating a medical advance directive? No - Patient declined No - Patient declined - Yes (MAU/Ambulatory/Procedural Areas - Information given) No - Patient declined Yes - Scientist, clinical (histocompatibility and immunogenetics) given Yes - Educational materials given    Hospital Utilization Over the Past 12 Months: # of hospitalizations or ER visits: 0 # of surgeries: 0  Review of Systems    Patient reports that her overall health is better compared to last year.  History obtained from chart review  Patient Reported Readings (BP, Pulse, CBG, Weight, etc) none  Pain Assessment Pain : No/denies pain     Current Medications & Allergies (verified) Allergies as of 09/25/2019     Reactions   Lipitor [atorvastatin Calcium]    Ace Inhibitors Cough      Medication List       Accurate as of September 25, 2019 11:15 AM. If you have any questions, ask your nurse or doctor.        STOP taking these medications   docusate sodium 100 MG capsule Commonly known as: Colace   ezetimibe 10 MG tablet Commonly known as: ZETIA   Fenofibric Acid 135 MG Cpdr     TAKE these medications   Accu-Chek FastClix Lancets Misc Use to check Blood Sugars twice daily.   acetaminophen 325 MG tablet Commonly known as: TYLENOL Take 325-650 mg by mouth every 6 (six) hours as needed (for pain or headaches).   diclofenac sodium 1 % Gel Commonly known as: VOLTAREN Apply 2-4 g topically See admin instructions. Apply 2-4 grams to each hand up to 4 times a day as needed for arthritis in the fingers   glucose blood test strip Commonly known as: Accu-Chek Guide Use as instructed   Insulin Glargine 100 UNIT/ML Solostar Pen Commonly known as: Lantus SoloStar Inject 110 Units into the skin daily after breakfast.   levothyroxine 125 MCG tablet Commonly known as: SYNTHROID Take 1 tablet (125 mcg total) by mouth daily.   metFORMIN 1000 MG tablet Commonly known as: GLUCOPHAGE TAKE 1 TABLET BY MOUTH 2 TIMES DAILY WITH A MEAL.   olmesartan 20 MG tablet Commonly known as: BENICAR Take 1 tablet (20 mg total) by mouth daily.   rosuvastatin 10 MG tablet Commonly known as: Crestor Take 1 tablet (10 mg total) by mouth daily. For cholesterol   Trulicity 1.5 0000000 Sopn Generic  drug: Dulaglutide INJECT 1 PEN INTO THE SKIN WEEKLY   Unifine Pentips 31G X 8 MM Misc Generic drug: Insulin Pen Needle Use as directed daily Dx E 11.69   Vitamin D 50 MCG (2000 UT) tablet Take 1 tablet (2,000 Units total) by mouth daily. What changed: when to take this       History (reviewed): Past Medical History:  Diagnosis Date  . Arthritis   . Cancer ([place])    thyroid CA  . Diabetes mellitus      type 2  . Hip fracture ([place])   . Thyroid disease    Past Surgical History:  Procedure Laterality Date  . CESAREAN SECTION    . HIP PINNING,CANNULATED Left 03/10/2018   Procedure: CANNULATED HIP PINNING;  Surgeon: Paralee Cancel, MD;  Location: [facility];  Service: Orthopedics;  Laterality: Left;  . KNEE ARTHROSCOPY Left 10/22/2016   Procedure: ARTHROSCOPY LEFT KNEE WITH DEBRIDEMENT;  Surgeon: Paralee Cancel, MD;  Location[facility];  Service: Orthopedics;  Laterality: Left;  . KNEE ARTHROSCOPY WITH MEDIAL MENISECTOMY  10/22/2016   Procedure: KNEE ARTHROSCOPY WITH MEDIAL MENISECTOMY;  Surgeon: Paralee Cancel, MD;  Location[facility];  Service: Orthopedics;;  . KNEE ARTHROSCOPY WITH SUBCHONDROPLASTY Left 10/22/2016   Procedure: KNEE ARTHROSCOPY WITH MEDIAL CHONDROPLASTY;  Surgeon: Paralee Cancel, MD;  Location[facility];  Service: Orthopedics;  Laterality: Left;  . THYROIDECTOMY  2009   Family History  Problem Relation Age of Onset  . Colon cancer Neg Hx   . Esophageal cancer Neg Hx   . Rectal cancer Neg Hx   . Stomach cancer Neg Hx   . Breast cancer Neg Hx    Social History   Socioeconomic History  . Marital status: Significant Other    Spouse name: Not on file  . Number of children: 3  . Years of education: 21  . Highest education level: High school graduate  Occupational History  . Occupation: retired  Tobacco Use  . Smoking status: Former Smoker    Quit date: 08/23/2000    Years since quitting: 19.1  . Smokeless tobacco: Never Used  Substance and Sexual Activity  . Alcohol use: No  . Drug use: No  . Sexual activity: Yes  Other Topics Concern  . Not on file  Social History Narrative  . Not on file   Social Determinants of Health   Financial Resource Strain: Low Risk   . Difficulty of Paying Living Expenses: Not hard at all  Food Insecurity: No Food Insecurity  . Worried About Charity fundraiser in the Last Year: Never true   . Ran Out of Food in the Last Year: Never true  Transportation Needs: No Transportation Needs  . Lack of Transportation (Medical): No  . Lack of Transportation (Non-Medical): No  Physical Activity: Sufficiently Active  . Days of Exercise per Week: 7 days  . Minutes of Exercise per Session: 30 min  Stress: No Stress Concern Present  . Feeling of Stress : Not at all  Social Connections: Not Isolated  . Frequency of Communication with Friends and Family: More than three times a week  . Frequency of Social Gatherings with Friends and Family: More than three times a week  . Attends Religious Services: More than 4 times per year  . Active Member of Clubs or Organizations: Yes  . Attends Archivist Meetings: More than 4 times per year  . Marital Status: Living with partner  Activities of Daily Living In your present state of health, do you have any difficulty performing the following activities: 09/25/2019  Hearing? Y  Comment has noticed some hearing changes-doesn't hear as good as she used to-declines Audiology at this time  Vision? N  Comment wears reading glasses for fine print-gets yearly eye exam  Difficulty concentrating or making decisions? N  Walking or climbing stairs? N  Dressing or bathing? N  Doing errands, shopping? N  Preparing Food and eating ? N  Using the Toilet? N  In the past six months, have you accidently leaked urine? Y  Comment some stress incontinence  Do you have problems with loss of bowel control? N  Managing your Medications? N  Managing your Finances? N  Housekeeping or managing your Housekeeping? N  Some recent data might be hidden    Patient Education/ Literacy How often do you need to have someone help you when you read instructions, pamphlets, or other written materials from your doctor or pharmacy?: 1 - Never What is the last grade level you completed in school?: 12th grade  Exercise Current Exercise Habits: Home exercise routine,  Type of exercise: walking, Time (Minutes): 30, Frequency (Times/Week): 7, Weekly Exercise (Minutes/Week): 210, Intensity: Mild, Exercise limited by: None identified  Diet Patient reports consuming 3 meals a day and 2 snack(s) a day Patient reports that her primary diet is: Regular Patient reports that she does have regular access to food.   Depression Screen PHQ 2/9 Scores 09/25/2019 04/14/2019 09/23/2018 09/23/2018 09/03/2018 06/03/2018 03/19/2018  PHQ - 2 Score 0 0 0 0 0 0 0     Fall Risk Fall Risk  09/25/2019 04/14/2019 09/23/2018 09/23/2018 09/03/2018  Falls in the past year? 0 0 1 0 0  Number falls in past yr: - - 0 - -  Injury with Fall? - - 1 - -  Comment - - - - -  Risk for fall due to : - - History of fall(s) - -     Objective:  Morgan Schmidt seemed alert and oriented and she participated appropriately during our telephone visit.  Blood Pressure Weight BMI  BP Readings from Last 3 Encounters:  04/14/19 140/62  09/23/18 138/83  09/03/18 133/69   Wt Readings from Last 3 Encounters:  04/14/19 169 lb (76.7 kg)  09/23/18 174 lb (78.9 kg)  09/03/18 171 lb 8 oz (77.8 kg)   BMI Readings from Last 1 Encounters:  04/14/19 34.13 kg/m    *Unable to obtain current vital signs, weight, and BMI due to telephone visit type  Hearing/Vision  . Morgan Schmidt did not seem to have difficulty with hearing/understanding during the telephone conversation . Reports that she has had a formal eye exam by an eye care professional within the past year . Reports that she has not had a formal hearing evaluation within the past year *Unable to fully assess hearing and vision during telephone visit type  Cognitive Function: 6CIT Screen 09/25/2019  What Year? 0 points  What month? 0 points  What time? 0 points  Count back from 20 0 points  Months in reverse 0 points  Repeat phrase 0 points  Total Score 0   (Normal:0-7, Significant for Dysfunction: >8)  Normal Cognitive Function Screening:  Yes   Immunization & Health Maintenance Record Immunization History  Administered Date(s) Administered  . Fluad Quad(high Dose 65+) 07/09/2019  . Influenza, High Dose Seasonal PF 06/03/2018  . Influenza-Unspecified 05/13/2017  . Pneumococcal Conjugate-13 09/03/2018    Health  Maintenance  Topic Date Due  . Hepatitis C Screening  Oct 26, 1950  . TETANUS/TDAP  03/02/1972  . DEXA SCAN  03/02/2018  . FOOT EXAM  03/04/2019  . PNA vac Low Risk Adult (2 of 2 - PPSV23) 09/04/2019  . HEMOGLOBIN A1C  01/19/2020  . OPHTHALMOLOGY EXAM  07/14/2020  . MAMMOGRAM  10/15/2020  . COLONOSCOPY  12/15/2024  . INFLUENZA VACCINE  Completed       Assessment  This is a routine wellness examination for Morgan Schmidt.  Health Maintenance: Due or Overdue Health Maintenance Due  Topic Date Due  . Hepatitis C Screening  [birth year]  . TETANUS/TDAP  03/02/1972  . DEXA SCAN  03/02/2018  . FOOT EXAM  03/04/2019  . PNA vac Low Risk Adult (2 of 2 - PPSV23) 09/04/2019    Morgan Schmidt does not need a referral for Community Assistance: Care Management:   no Social Work:    no Prescription Assistance:  no Nutrition/Diabetes Education:  no   Plan:  Personalized Goals Goals Addressed            This Visit's Progress   . DIET - DECREASE SODA OR JUICE INTAKE        Personalized Health Maintenance & Screening Recommendations  Td vaccine Bone densitometry screening Shingles vaccine  Lung Cancer Screening Recommended: no (Low Dose CT Chest recommended if Age 65-80 years, 30 pack-year currently smoking OR have quit w/in past 15 years) Hepatitis C Screening recommended: yes-will offer at next visit with PCP HIV Screening recommended: no  Advanced Directives: Written information was not prepared per patient's request.  Referrals & Orders No orders of the defined types were placed in this encounter.   Follow-up Plan . Follow-up with Claretta Fraise, MD as planned . Schedule your DEXA as  discussed . Speak with your pharmacist about TDAP and Shingrix vaccines . Consider Pneumovax vaccine at your next visit with your PCP   I have personally reviewed and noted the following in the patient's chart:   . Medical and social history . Use of alcohol, tobacco or illicit drugs  . Current medications and supplements . Functional ability and status . Nutritional status . Physical activity . Advanced directives . List of other physicians . Hospitalizations, surgeries, and ER visits in previous 12 months . Vitals . Screenings to include cognitive, depression, and falls . Referrals and appointments  In addition, I have reviewed and discussed with Morgan Schmidt certain preventive protocols, quality metrics, and best practice recommendations. A written personalized care plan for preventive services as well as general preventive health recommendations is available and can be mailed to the patient at her request.      Milas Hock, LPN  [phone]

## 2019-10-15 ENCOUNTER — Other Ambulatory Visit: Payer: Self-pay | Admitting: *Deleted

## 2019-10-15 DIAGNOSIS — E039 Hypothyroidism, unspecified: Secondary | ICD-10-CM

## 2019-10-15 MED ORDER — ROSUVASTATIN CALCIUM 10 MG PO TABS: 10.0000 mg | ORAL_TABLET | Freq: Every day | ORAL | 1 refills | Status: DC

## 2019-10-15 MED ORDER — METFORMIN HCL 1000 MG PO TABS: ORAL_TABLET | ORAL | 0 refills | Status: DC

## 2019-10-15 MED ORDER — ACCU-CHEK AVIVA PLUS W/DEVICE KIT: PACK | 0 refills | Status: AC

## 2019-10-15 MED ORDER — BD SWAB SINGLE USE REGULAR PADS: MEDICATED_PAD | 3 refills | Status: AC

## 2019-10-15 MED ORDER — OLMESARTAN MEDOXOMIL 20 MG PO TABS: 20.0000 mg | ORAL_TABLET | Freq: Every day | ORAL | 1 refills | Status: DC

## 2019-10-15 MED ORDER — ACCU-CHEK AVIVA VI SOLN: 3 refills | Status: AC

## 2019-10-15 MED ORDER — LEVOTHYROXINE SODIUM 125 MCG PO TABS: 125.0000 ug | ORAL_TABLET | Freq: Every day | ORAL | 1 refills | Status: DC

## 2019-10-15 MED ORDER — ACCU-CHEK AVIVA PLUS VI STRP: ORAL_STRIP | 3 refills | Status: DC

## 2019-10-15 MED ORDER — ACCU-CHEK FASTCLIX LANCETS MISC: 3 refills | Status: DC

## 2019-10-23 ENCOUNTER — Other Ambulatory Visit: Payer: Self-pay

## 2019-10-23 ENCOUNTER — Other Ambulatory Visit: Payer: Self-pay | Admitting: *Deleted

## 2019-10-23 MED ORDER — ACCU-CHEK FASTCLIX LANCETS MISC: 3 refills | Status: DC

## 2019-10-26 ENCOUNTER — Ambulatory Visit (INDEPENDENT_AMBULATORY_CARE_PROVIDER_SITE_OTHER): Payer: Medicare HMO | Admitting: Family Medicine

## 2019-10-26 ENCOUNTER — Other Ambulatory Visit: Payer: Self-pay

## 2019-10-26 ENCOUNTER — Encounter: Payer: Self-pay | Admitting: Family Medicine

## 2019-10-26 VITALS — BP 138/74 | HR 77 | Temp 99.2°F | Ht 59.0 in | Wt 170.5 lb

## 2019-10-26 DIAGNOSIS — E038 Other specified hypothyroidism: Secondary | ICD-10-CM

## 2019-10-26 DIAGNOSIS — Z794 Long term (current) use of insulin: Secondary | ICD-10-CM

## 2019-10-26 DIAGNOSIS — E782 Mixed hyperlipidemia: Secondary | ICD-10-CM

## 2019-10-26 DIAGNOSIS — E1169 Type 2 diabetes mellitus with other specified complication: Secondary | ICD-10-CM

## 2019-10-26 DIAGNOSIS — I10 Essential (primary) hypertension: Secondary | ICD-10-CM | POA: Diagnosis not present

## 2019-10-26 DIAGNOSIS — E118 Type 2 diabetes mellitus with unspecified complications: Secondary | ICD-10-CM | POA: Diagnosis not present

## 2019-10-26 DIAGNOSIS — Z23 Encounter for immunization: Secondary | ICD-10-CM | POA: Diagnosis not present

## 2019-10-26 LAB — BAYER DCA HB A1C WAIVED: HB A1C (BAYER DCA - WAIVED): 7.7 % — ABNORMAL HIGH (ref <7.0)

## 2019-10-26 MED ORDER — LEVOTHYROXINE SODIUM 125 MCG PO TABS: 125.0000 ug | ORAL_TABLET | Freq: Every day | ORAL | 1 refills | Status: DC

## 2019-10-26 MED ORDER — OLMESARTAN MEDOXOMIL 20 MG PO TABS: 20.0000 mg | ORAL_TABLET | Freq: Every day | ORAL | 1 refills | Status: DC

## 2019-10-26 MED ORDER — METFORMIN HCL 1000 MG PO TABS: ORAL_TABLET | ORAL | 0 refills | Status: DC

## 2019-10-26 MED ORDER — ROSUVASTATIN CALCIUM 10 MG PO TABS: 10.0000 mg | ORAL_TABLET | Freq: Every day | ORAL | 1 refills | Status: DC

## 2019-10-26 MED ORDER — TRULICITY 1.5 MG/0.5ML ~~LOC~~ SOAJ: SUBCUTANEOUS | 3 refills | Status: DC

## 2019-10-26 MED ORDER — LANTUS SOLOSTAR 100 UNIT/ML ~~LOC~~ SOPN: 110.0000 [IU] | PEN_INJECTOR | Freq: Every day | SUBCUTANEOUS | 3 refills | Status: DC

## 2019-10-26 MED ORDER — UNIFINE PENTIPS 31G X 8 MM MISC: 3 refills | Status: DC

## 2019-10-26 NOTE — Progress Notes (Signed)
Subjective:  [name],  female    DOB: [birth year]  Age: 69 y.o.    CC: Medical Management of Chronic Issues   HPI Morgan Schmidt presents for  follow-up of hypertension. Patient has no history of headache chest pain or shortness of breath or recent cough. Patient also denies symptoms of TIA such as numbness weakness lateralizing. Patient denies side effects from medication. States taking it regularly.  Patient also  in for follow-up of elevated cholesterol. Doing well without complaints on current medication. Denies side effects  including myalgia and arthralgia and nausea. Also in today for liver function testing. Currently no chest pain, shortness of breath or other cardiovascular related symptoms noted.  Follow-up of diabetes. Patient does check blood sugar at home. Readings run between 108-120fsting and up to 200 post prandial Patient denies symptoms such as excessive hunger or urinary frequency, excessive hunger, nausea No significant hypoglycemic spells noted. Medications reviewed. Pt reports taking them regularly. Pt. denies complication/adverse reaction today.    follow-up on  thyroid. The patient has a history of hypothyroidism for many years. It has been stable recently. Pt. denies any change in  voice, loss of hair, heat or cold intolerance. Energy level has been adequate to good. Patient denies constipation and diarrhea. No myxedema. Medication is as noted below. Verified that pt is taking it daily on an empty stomach. Well tolerated.   History PMaeleyhas a past medical history of Arthritis, Cancer ([place], Diabetes mellitus, [place], and Thyroid disease.   She has a past surgical history that includes Thyroidectomy (2009); Cesarean section; Knee arthroscopy (Left, 10/22/2016); Knee arthroscopy with medial menisectomy (10/22/2016); Knee arthroscopy with subchondroplasty (Left, 10/22/2016); and Hip pinning, cannulated (Left, 03/10/2018).   Her family  history is not on file.She reports that she quit smoking about 19 years ago. She has never used smokeless tobacco. She reports that she does not drink alcohol or use drugs.  Current Outpatient Medications on File Prior to Visit  Medication Sig Dispense Refill  . Accu-Chek FastClix Lancets MISC Use to check Blood Sugars twice daily. Dx E11.8 204 each 3  . acetaminophen (TYLENOL) 325 MG tablet Take 325-650 mg by mouth every 6 (six) hours as needed (for pain or headaches).    . Alcohol Swabs (B-D SINGLE USE SWABS REGULAR) PADS Use to check Blood Sugars twice daily. Dx E11.8 200 each 3  . Blood Glucose Calibration (ACCU-CHEK AVIVA) SOLN Use to check Blood Sugars twice daily. Dx E11.8 1 each 3  . Blood Glucose Monitoring Suppl (ACCU-CHEK AVIVA PLUS) w/Device KIT Use to check Blood Sugars twice daily. Dx E11.8 1 kit 0  . Cholecalciferol (VITAMIN D) 2000 UNITS tablet Take 1 tablet (2,000 Units total) by mouth daily. (Patient taking differently: Take 2,000 Units by mouth every evening. ) 30 tablet 11  . diclofenac sodium (VOLTAREN) 1 % GEL Apply 2-4 g topically See admin instructions. Apply 2-4 grams to each hand up to 4 times a day as needed for arthritis in the fingers 100 g 11  . glucose blood (ACCU-CHEK AVIVA PLUS) test strip Use to check Blood Sugars twice daily. Dx E11.8 200 each 3   No current facility-administered medications on file prior to visit.    ROS Review of Systems  Constitutional: Negative.   HENT: Negative for congestion.   Eyes: Negative for visual disturbance.  Respiratory: Negative for shortness of breath.   Cardiovascular: Negative for chest pain.  Gastrointestinal: Negative for abdominal pain, constipation,  diarrhea, nausea and vomiting.  Genitourinary: Negative for difficulty urinating.  Musculoskeletal: Negative for arthralgias and myalgias.  Neurological: Negative for headaches.  Psychiatric/Behavioral: Negative for sleep disturbance.    Objective:  BP 138/74    Pulse 77   Temp 99.2 F (37.3 C) (Oral)   Ht '4\' 11"'$  (1.499 m)   Wt 170 lb 8 oz (77.3 kg)   LMP 03/02/2004   BMI 34.44 kg/m   BP Readings from Last 3 Encounters:  10/26/19 138/74  04/14/19 140/62  09/23/18 138/83    Wt Readings from Last 3 Encounters:  10/26/19 170 lb 8 oz (77.3 kg)  04/14/19 169 lb (76.7 kg)  09/23/18 174 lb (78.9 kg)     Physical Exam Constitutional:      General: She is not in acute distress.    Appearance: She is well-developed.  HENT:     Head: Normocephalic and atraumatic.  Eyes:     Conjunctiva/sclera: Conjunctivae normal.     Pupils: Pupils are equal, round, and reactive to light.  Neck:     Thyroid: No thyromegaly.  Cardiovascular:     Rate and Rhythm: Normal rate and regular rhythm.     Heart sounds: Normal heart sounds. No murmur.  Pulmonary:     Effort: Pulmonary effort is normal. No respiratory distress.     Breath sounds: Normal breath sounds. No wheezing or rales.  Abdominal:     General: Bowel sounds are normal. There is no distension.     Palpations: Abdomen is soft.     Tenderness: There is no abdominal tenderness.  Musculoskeletal:        General: Normal range of motion.     Cervical back: Normal range of motion and neck supple.  Lymphadenopathy:     Cervical: No cervical adenopathy.  Skin:    General: Skin is warm and dry.  Neurological:     Mental Status: She is alert and oriented to person, place, and time.  Psychiatric:        Behavior: Behavior normal.        Thought Content: Thought content normal.        Judgment: Judgment normal.     Diabetic Foot Exam - Simple   Simple Foot Form Diabetic Foot exam was performed with the following findings: Yes 10/26/2019  8:30 AM  Visual Inspection No deformities, no ulcerations, no other skin breakdown bilaterally: Yes Sensation Testing Intact to touch and monofilament testing bilaterally: Yes Pulse Check Posterior Tibialis and Dorsalis pulse intact bilaterally:  Yes Comments       Assessment & Plan:   Morgan Schmidt was seen today for medical management of chronic issues.  Diagnoses and all orders for this visit:  Controlled type 2 diabetes mellitus with complication, with long-term current use of insulin ([facility]) -     Bayer DCA Hb A1c Waived -     CBC with Differential/Platelet -     CMP14+EGFR -     TSH + free T4 -     Bayer DCA Hb A1c Waived -     Insulin Pen Needle (UNIFINE PENTIPS) 31G X 8 MM MISC; Use as directed daily Dx E 11.69 -     Dulaglutide (TRULICITY) 1.5 AJ/6.8TL SOPN; INJECT 1 PEN INTO THE SKIN WEEKLY -     insulin glargine (LANTUS SOLOSTAR) 100 UNIT/ML Solostar Pen; Inject 110 Units into the skin daily after breakfast. -     metFORMIN (GLUCOPHAGE) 1000 MG tablet; TAKE 1 TABLET BY MOUTH 2 TIMES DAILY  WITH A MEAL. -     olmesartan (BENICAR) 20 MG tablet; Take 1 tablet (20 mg total) by mouth daily.  Essential hypertension -     CBC with Differential/Platelet -     CMP14+EGFR -     olmesartan (BENICAR) 20 MG tablet; Take 1 tablet (20 mg total) by mouth daily.  Other specified hypothyroidism -     CBC with Differential/Platelet -     CMP14+EGFR -     TSH + free T4 -     levothyroxine (SYNTHROID) 125 MCG tablet; Take 1 tablet (125 mcg total) by mouth daily.  Mixed diabetic hyperlipidemia associated with type 2 diabetes mellitus (HCC) -     CBC with Differential/Platelet -     CMP14+EGFR -     rosuvastatin (CRESTOR) 10 MG tablet; Take 1 tablet (10 mg total) by mouth daily. For cholesterol  Long term current use of insulin (HCC) -     CBC with Differential/Platelet -     CMP14+EGFR  Other orders -     Pneumococcal polysaccharide vaccine 23-valent greater than or equal to 2yo subcutaneous/IM   I am having Morgan Schmidt maintain her Vitamin D, acetaminophen, diclofenac sodium, Accu-Chek Aviva Plus, Accu-Chek Aviva, Accu-Chek Aviva Plus, B-D SINGLE USE SWABS REGULAR, Accu-Chek FastClix Lancets, Unifine Pentips, Trulicity,  Lantus SoloStar, levothyroxine, metFORMIN, rosuvastatin, and olmesartan.  Meds ordered this encounter  Medications  . Insulin Pen Needle (UNIFINE PENTIPS) 31G X 8 MM MISC    Sig: Use as directed daily Dx E 11.69    Dispense:  100 each    Refill:  3  . Dulaglutide (TRULICITY) 1.5 GL/0.6HH SOPN    Sig: INJECT 1 PEN INTO THE SKIN WEEKLY    Dispense:  12 mL    Refill:  3  . insulin glargine (LANTUS SOLOSTAR) 100 UNIT/ML Solostar Pen    Sig: Inject 110 Units into the skin daily after breakfast.    Dispense:  5 pen    Refill:  3  . levothyroxine (SYNTHROID) 125 MCG tablet    Sig: Take 1 tablet (125 mcg total) by mouth daily.    Dispense:  90 tablet    Refill:  1  . metFORMIN (GLUCOPHAGE) 1000 MG tablet    Sig: TAKE 1 TABLET BY MOUTH 2 TIMES DAILY WITH A MEAL.    Dispense:  180 tablet    Refill:  0  . rosuvastatin (CRESTOR) 10 MG tablet    Sig: Take 1 tablet (10 mg total) by mouth daily. For cholesterol    Dispense:  90 tablet    Refill:  1  . olmesartan (BENICAR) 20 MG tablet    Sig: Take 1 tablet (20 mg total) by mouth daily.    Dispense:  90 tablet    Refill:  1     Follow-up: Return in about 3 months (around 01/26/2020).  Claretta Fraise, M.D.

## 2019-10-27 LAB — CMP14+EGFR
ALT: 12 IU/L (ref 0–32)
AST: 15 IU/L (ref 0–40)
Albumin/Globulin Ratio: 2.2 (ref 1.2–2.2)
Albumin: 4.6 g/dL (ref 3.8–4.8)
Alkaline Phosphatase: 86 IU/L (ref 39–117)
BUN/Creatinine Ratio: 12 (ref 12–28)
BUN: 9 mg/dL (ref 8–27)
Bilirubin Total: 0.5 mg/dL (ref 0.0–1.2)
CO2: 22 mmol/L (ref 20–29)
Calcium: 8.3 mg/dL — ABNORMAL LOW (ref 8.7–10.3)
Chloride: 99 mmol/L (ref 96–106)
Creatinine, Ser: 0.74 mg/dL (ref 0.57–1.00)
GFR calc Af Amer: 98 mL/min/{1.73_m2} (ref >59)
GFR calc non Af Amer: 85 mL/min/{1.73_m2} (ref >59)
Globulin, Total: 2.1 g/dL (ref 1.5–4.5)
Glucose: 88 mg/dL (ref 65–99)
Potassium: 3.9 mmol/L (ref 3.5–5.2)
Sodium: 137 mmol/L (ref 134–144)
Total Protein: 6.7 g/dL (ref 6.0–8.5)

## 2019-10-27 LAB — CBC WITH DIFFERENTIAL/PLATELET
Basophils Absolute: 0.1 10*3/uL (ref 0.0–0.2)
Basos: 1 %
EOS (ABSOLUTE): 0.1 10*3/uL (ref 0.0–0.4)
Eos: 2 %
Hematocrit: 41.1 % (ref 34.0–46.6)
Hemoglobin: 13.8 g/dL (ref 11.1–15.9)
Immature Grans (Abs): 0 10*3/uL (ref 0.0–0.1)
Immature Granulocytes: 1 %
Lymphocytes Absolute: 1.9 10*3/uL (ref 0.7–3.1)
Lymphs: 29 %
MCH: 26.7 pg (ref 26.6–33.0)
MCHC: 33.6 g/dL (ref 31.5–35.7)
MCV: 80 fL (ref 79–97)
Monocytes Absolute: 0.4 10*3/uL (ref 0.1–0.9)
Monocytes: 6 %
Neutrophils Absolute: 4 10*3/uL (ref 1.4–7.0)
Neutrophils: 61 %
Platelets: 212 10*3/uL (ref 150–450)
RBC: 5.16 x10E6/uL (ref 3.77–5.28)
RDW: 13.2 % (ref 11.7–15.4)
WBC: 6.5 10*3/uL (ref 3.4–10.8)

## 2019-10-27 LAB — TSH+FREE T4
Free T4: 1.92 ng/dL — ABNORMAL HIGH (ref 0.82–1.77)
TSH: 0.321 u[IU]/mL — ABNORMAL LOW (ref 0.450–4.500)

## 2019-11-05 ENCOUNTER — Telehealth: Payer: Self-pay | Admitting: Family Medicine

## 2019-11-05 NOTE — Chronic Care Management (AMB) (Signed)
  Chronic Care Management   Outreach Note  11/05/2019 Name: Morgan Schmidt MRN: [id number] DOB: [birth year]  Morgan Schmidt is a 69 y.o. year old female who is a primary care patient of Stacks, Cletus Gash, MD. I reached out to [name] by phone today in response to a referral sent by Ms. Salomon Mast [facility] health plan.     An unsuccessful telephone outreach was attempted today. The patient was referred to the case management team for assistance with care management and care coordination.   Follow Up Plan: The care management team will reach out to the patient again over the next 7 days.  If patient returns call to provider office, please advise to call [facility]  at [place], [place], [place], [place] [address] Direct Dial: [phone] [email or web address] Website: [email or web address]

## 2019-11-09 NOTE — Chronic Care Management (AMB) (Signed)
  Chronic Care Management   Note  11/09/2019 Name: HASANA ALCORTA MRN: [id number] DOB: [birth year]  DELITHA ELMS is a 69 y.o. year old female who is a primary care patient of Stacks, Cletus Gash, MD. I reached out to [name] by phone today in response to a referral sent by Ms. Salomon Mast [facility] health plan.     Ms. Eunice was given information about Chronic Care Management services today including:  1. CCM service includes personalized support from designated clinical staff supervised by her physician, including individualized plan of care and coordination with other care providers 2. 24/7 contact phone numbers for assistance for urgent and routine care needs. 3. Service will only be billed when office clinical staff spend 20 minutes or more in a month to coordinate care. 4. Only one practitioner may furnish and bill the service in a calendar month. 5. The patient may stop CCM services at any time (effective at the end of the month) by phone call to the office staff. 6. The patient will be responsible for cost sharing (co-pay) of up to 20% of the service fee (after annual deductible is met).  Patient agreed to services and verbal consent obtained.   Follow up plan: Telephone appointment with care management team member scheduled for: 04/13/2020  Noreene Larsson, [place], [place], [place] [address] Direct Dial: [phone] [email or web address] Website: [email or web address]

## 2019-11-19 ENCOUNTER — Other Ambulatory Visit: Payer: Self-pay | Admitting: Family Medicine

## 2019-11-19 DIAGNOSIS — Z1231 Encounter for screening mammogram for malignant neoplasm of breast: Secondary | ICD-10-CM

## 2019-11-27 ENCOUNTER — Ambulatory Visit
Admission: RE | Admit: 2019-11-27 | Discharge: 2019-11-27 | Disposition: A | Discharge status: Home / Self Care | Payer: Medicare HMO | Source: Ambulatory Visit | Attending: Family Medicine | Admitting: Family Medicine

## 2019-11-27 ENCOUNTER — Other Ambulatory Visit: Payer: Self-pay

## 2019-11-27 DIAGNOSIS — Z1231 Encounter for screening mammogram for malignant neoplasm of breast: Secondary | ICD-10-CM | POA: Diagnosis not present

## 2020-02-09 ENCOUNTER — Ambulatory Visit (INDEPENDENT_AMBULATORY_CARE_PROVIDER_SITE_OTHER): Payer: Medicare HMO | Admitting: Family Medicine

## 2020-02-09 ENCOUNTER — Other Ambulatory Visit: Payer: Self-pay

## 2020-02-09 ENCOUNTER — Encounter: Payer: Self-pay | Admitting: Family Medicine

## 2020-02-09 VITALS — BP 131/61 | HR 87 | Temp 97.4°F | Resp 20 | Ht 59.0 in | Wt 172.0 lb

## 2020-02-09 DIAGNOSIS — E118 Type 2 diabetes mellitus with unspecified complications: Secondary | ICD-10-CM | POA: Diagnosis not present

## 2020-02-09 DIAGNOSIS — I10 Essential (primary) hypertension: Secondary | ICD-10-CM | POA: Diagnosis not present

## 2020-02-09 DIAGNOSIS — Z794 Long term (current) use of insulin: Secondary | ICD-10-CM

## 2020-02-09 DIAGNOSIS — E039 Hypothyroidism, unspecified: Secondary | ICD-10-CM | POA: Diagnosis not present

## 2020-02-09 LAB — BAYER DCA HB A1C WAIVED: HB A1C (BAYER DCA - WAIVED): 7.7 % — ABNORMAL HIGH (ref <7.0)

## 2020-02-09 MED ORDER — UNIFINE PENTIPS 31G X 8 MM MISC: 3 refills | Status: DC

## 2020-02-09 MED ORDER — TRULICITY 3 MG/0.5ML ~~LOC~~ SOAJ: 3.0000 mg | SUBCUTANEOUS | 5 refills | Status: DC

## 2020-02-09 NOTE — Progress Notes (Signed)
Subjective:  [name], female    DOB: 07-Jan-1951  Age: 69 y.o. MRN: [id number]  CC: Medical Management of Chronic Issues   HPI Morgan Schmidt presents forFollow-up of diabetes. Patient checks blood sugar at home.   100 fasting and 140-160 postprandial Patient denies symptoms such as polyuria, polydipsia, excessive hunger, nausea No significant hypoglycemic spells noted. Medications reviewed. Pt reports taking them regularly without complication/adverse reaction being reported today.   History Morgan Schmidt has a past medical history of Arthritis, Cancer ([place]), Diabetes mellitus, Hip fracture ([place]), and Thyroid disease.   Morgan Schmidt has a past surgical history that includes Thyroidectomy (2009); Cesarean section; Knee arthroscopy (Left, 10/22/2016); Knee arthroscopy with medial menisectomy (10/22/2016); Knee arthroscopy with subchondroplasty (Left, 10/22/2016); and Hip pinning, cannulated (Left, 03/10/2018).   Morgan Schmidt family history is not on file.Morgan Schmidt reports that Morgan Schmidt quit smoking about 19 years ago. Morgan Schmidt has never used smokeless tobacco. Morgan Schmidt reports that Morgan Schmidt does not drink alcohol and does not use drugs.  Current Outpatient Medications on File Prior to Visit  Medication Sig Dispense Refill  . Accu-Chek FastClix Lancets MISC Use to check Blood Sugars twice daily. Dx E11.8 204 each 3  . acetaminophen (TYLENOL) 325 MG tablet Take 325-650 mg by mouth every 6 (six) hours as needed (for pain or headaches).    . Alcohol Swabs (B-D SINGLE USE SWABS REGULAR) PADS Use to check Blood Sugars twice daily. Dx E11.8 200 each 3  . Blood Glucose Calibration (ACCU-CHEK AVIVA) SOLN Use to check Blood Sugars twice daily. Dx E11.8 1 each 3  . Blood Glucose Monitoring Suppl (ACCU-CHEK AVIVA PLUS) w/Device KIT Use to check Blood Sugars twice daily. Dx E11.8 1 kit 0  . Cholecalciferol (VITAMIN D) 2000 UNITS tablet Take 1 tablet (2,000 Units total) by mouth daily. (Patient taking differently: Take 2,000 Units by  mouth every evening. ) 30 tablet 11  . diclofenac sodium (VOLTAREN) 1 % GEL Apply 2-4 g topically See admin instructions. Apply 2-4 grams to each hand up to 4 times a day as needed for arthritis in the fingers 100 g 11  . glucose blood (ACCU-CHEK AVIVA PLUS) test strip Use to check Blood Sugars twice daily. Dx E11.8 200 each 3  . insulin glargine (LANTUS SOLOSTAR) 100 UNIT/ML Solostar Pen Inject 110 Units into the skin daily after breakfast. 5 pen 3  . levothyroxine (SYNTHROID) 125 MCG tablet Take 1 tablet (125 mcg total) by mouth daily. 90 tablet 1  . metFORMIN (GLUCOPHAGE) 1000 MG tablet TAKE 1 TABLET BY MOUTH 2 TIMES DAILY WITH A MEAL. 180 tablet 0  . olmesartan (BENICAR) 20 MG tablet Take 1 tablet (20 mg total) by mouth daily. 90 tablet 1  . rosuvastatin (CRESTOR) 10 MG tablet Take 1 tablet (10 mg total) by mouth daily. For cholesterol 90 tablet 1   No current facility-administered medications on file prior to visit.    ROS Review of Systems  Constitutional: Negative.   HENT: Negative.   Eyes: Negative for visual disturbance.  Respiratory: Negative for shortness of breath.   Cardiovascular: Negative for chest pain.  Gastrointestinal: Negative for abdominal pain.  Musculoskeletal: Negative for arthralgias.    Objective:  BP 131/61   Pulse 87   Temp (!) 97.4 F (36.3 C) (Temporal)   Resp 20   Ht 4' 11"  (1.499 m)   Wt 172 lb (78 kg)   LMP 03/02/2004   SpO2 97%   BMI 34.74 kg/m   BP Readings from Last 3  Encounters:  02/09/20 131/61  10/26/19 138/74  04/14/19 140/62    Wt Readings from Last 3 Encounters:  02/09/20 172 lb (78 kg)  10/26/19 170 lb 8 oz (77.3 kg)  04/14/19 169 lb (76.7 kg)     Physical Exam Constitutional:      General: Morgan Schmidt is not in acute distress.    Appearance: Morgan Schmidt is well-developed.  HENT:     Head: Normocephalic and atraumatic.  Eyes:     Conjunctiva/sclera: Conjunctivae normal.     Pupils: Pupils are equal, round, and reactive to light.   Neck:     Thyroid: No thyromegaly.  Cardiovascular:     Rate and Rhythm: Normal rate and regular rhythm.     Heart sounds: Normal heart sounds. No murmur heard.   Pulmonary:     Effort: Pulmonary effort is normal. No respiratory distress.     Breath sounds: Normal breath sounds. No wheezing or rales.  Abdominal:     General: Bowel sounds are normal. There is no distension.     Palpations: Abdomen is soft.     Tenderness: There is no abdominal tenderness.  Musculoskeletal:        General: Normal range of motion.     Cervical back: Normal range of motion and neck supple.  Lymphadenopathy:     Cervical: No cervical adenopathy.  Skin:    General: Skin is warm and dry.  Neurological:     Mental Status: Morgan Schmidt is alert and oriented to person, place, and time.  Psychiatric:        Behavior: Behavior normal.        Thought Content: Thought content normal.        Judgment: Judgment normal.       Assessment & Plan:   Morgan Schmidt was seen today for medical management of chronic issues.  Diagnoses and all orders for this visit:  Controlled type 2 diabetes mellitus with complication, with long-term current use of insulin (HCC) -     Microalbumin / creatinine urine ratio -     Bayer DCA Hb A1c Waived -     CBC with Differential/Platelet -     CMP14+EGFR -     Lipid panel -     Insulin Pen Needle (UNIFINE PENTIPS) 31G X 8 MM MISC; Use as directed daily Dx E 11.69  Essential hypertension -     CBC with Differential/Platelet -     CMP14+EGFR -     Lipid panel  Hypothyroidism, unspecified type -     CBC with Differential/Platelet -     CMP14+EGFR -     Lipid panel -     Thyroid Panel With TSH -     TSH + free T4  Other orders -     Dulaglutide (TRULICITY) 3 WC/3.7SE SOPN; Inject 0.5 mLs (3 mg total) into the skin once a week.      I have discontinued Morgan Schmidt. Morgan Schmidt's Trulicity. I am also having Morgan Schmidt start on Trulicity. Additionally, I am having Morgan Schmidt maintain Morgan Schmidt Vitamin D,  acetaminophen, diclofenac sodium, Accu-Chek Aviva Plus, Accu-Chek Aviva, Accu-Chek Aviva Plus, B-D SINGLE USE SWABS REGULAR, Accu-Chek FastClix Lancets, Lantus SoloStar, levothyroxine, metFORMIN, rosuvastatin, olmesartan, and Unifine Pentips.  Meds ordered this encounter  Medications  . Dulaglutide (TRULICITY) 3 GB/1.5VV SOPN    Sig: Inject 0.5 mLs (3 mg total) into the skin once a week.    Dispense:  4 pen    Refill:  5  . Insulin Pen Needle (UNIFINE  PENTIPS) 31G X 8 MM MISC    Sig: Use as directed daily Dx E 11.69    Dispense:  100 each    Refill:  3     Follow-up: Return in about 1 month (around 03/10/2020).  Claretta Fraise, M.D.

## 2020-02-10 ENCOUNTER — Other Ambulatory Visit: Payer: Self-pay | Admitting: Family Medicine

## 2020-02-10 ENCOUNTER — Other Ambulatory Visit: Payer: Self-pay

## 2020-02-10 DIAGNOSIS — E039 Hypothyroidism, unspecified: Secondary | ICD-10-CM

## 2020-02-10 DIAGNOSIS — E038 Other specified hypothyroidism: Secondary | ICD-10-CM

## 2020-02-10 LAB — LIPID PANEL
Chol/HDL Ratio: 3 ratio (ref 0.0–4.4)
Cholesterol, Total: 122 mg/dL (ref 100–199)
HDL: 41 mg/dL (ref >39)
LDL Chol Calc (NIH): 56 mg/dL (ref 0–99)
Triglycerides: 143 mg/dL (ref 0–149)
VLDL Cholesterol Cal: 25 mg/dL (ref 5–40)

## 2020-02-10 LAB — CBC WITH DIFFERENTIAL/PLATELET
Basophils Absolute: 0.1 10*3/uL (ref 0.0–0.2)
Basos: 2 %
EOS (ABSOLUTE): 0.1 10*3/uL (ref 0.0–0.4)
Eos: 2 %
Hematocrit: 40.4 % (ref 34.0–46.6)
Hemoglobin: 13.7 g/dL (ref 11.1–15.9)
Immature Grans (Abs): 0 10*3/uL (ref 0.0–0.1)
Immature Granulocytes: 1 %
Lymphocytes Absolute: 2.2 10*3/uL (ref 0.7–3.1)
Lymphs: 31 %
MCH: 26.5 pg — ABNORMAL LOW (ref 26.6–33.0)
MCHC: 33.9 g/dL (ref 31.5–35.7)
MCV: 78 fL — ABNORMAL LOW (ref 79–97)
Monocytes Absolute: 0.4 10*3/uL (ref 0.1–0.9)
Monocytes: 6 %
Neutrophils Absolute: 4.3 10*3/uL (ref 1.4–7.0)
Neutrophils: 58 %
Platelets: 240 10*3/uL (ref 150–450)
RBC: 5.17 x10E6/uL (ref 3.77–5.28)
RDW: 13.9 % (ref 11.7–15.4)
WBC: 7.2 10*3/uL (ref 3.4–10.8)

## 2020-02-10 LAB — CMP14+EGFR
ALT: 15 IU/L (ref 0–32)
AST: 20 IU/L (ref 0–40)
Albumin/Globulin Ratio: 2 (ref 1.2–2.2)
Albumin: 4.6 g/dL (ref 3.8–4.8)
Alkaline Phosphatase: 84 IU/L (ref 48–121)
BUN/Creatinine Ratio: 15 (ref 12–28)
BUN: 10 mg/dL (ref 8–27)
Bilirubin Total: 0.7 mg/dL (ref 0.0–1.2)
CO2: 24 mmol/L (ref 20–29)
Calcium: 8.4 mg/dL — ABNORMAL LOW (ref 8.7–10.3)
Chloride: 100 mmol/L (ref 96–106)
Creatinine, Ser: 0.67 mg/dL (ref 0.57–1.00)
GFR calc Af Amer: 106 mL/min/{1.73_m2} (ref >59)
GFR calc non Af Amer: 92 mL/min/{1.73_m2} (ref >59)
Globulin, Total: 2.3 g/dL (ref 1.5–4.5)
Glucose: 91 mg/dL (ref 65–99)
Potassium: 4.2 mmol/L (ref 3.5–5.2)
Sodium: 139 mmol/L (ref 134–144)
Total Protein: 6.9 g/dL (ref 6.0–8.5)

## 2020-02-10 LAB — THYROID PANEL WITH TSH
Free Thyroxine Index: 3.9 (ref 1.2–4.9)
T3 Uptake Ratio: 28 % (ref 24–39)
T4, Total: 13.9 ug/dL — ABNORMAL HIGH (ref 4.5–12.0)

## 2020-02-10 LAB — TSH+FREE T4
Free T4: 1.94 ng/dL — ABNORMAL HIGH (ref 0.82–1.77)
TSH: 0.306 u[IU]/mL — ABNORMAL LOW (ref 0.450–4.500)

## 2020-02-10 MED ORDER — LEVOTHYROXINE SODIUM 112 MCG PO TABS: 112.0000 ug | ORAL_TABLET | Freq: Every day | ORAL | 1 refills | Status: DC

## 2020-02-23 ENCOUNTER — Telehealth: Payer: Self-pay | Admitting: Family Medicine

## 2020-02-23 NOTE — Chronic Care Management (AMB) (Signed)
  Care Management   Note  02/23/2020 Name: Morgan Schmidt MRN: [id number] DOB: Oct 06, 1950  Morgan Schmidt is a 69 y.o. year old female who is a primary care patient of Stacks, Cletus Gash, MD and is actively engaged with the care management team. I reached out to Morgan Schmidt by phone today to assist with re-scheduling an initial visit with the RN Case Manager.  Follow up plan: Telephone appointment with care management team member scheduled for:04/20/2020  [place], [place] [facility]  [place], [place] [address] Direct Dial: [email or web address]: [email or web address]

## 2020-04-13 ENCOUNTER — Telehealth: Payer: Medicare HMO

## 2020-04-20 ENCOUNTER — Ambulatory Visit: Payer: Medicare HMO | Admitting: *Deleted

## 2020-04-20 DIAGNOSIS — E039 Hypothyroidism, unspecified: Secondary | ICD-10-CM

## 2020-04-20 DIAGNOSIS — I10 Essential (primary) hypertension: Secondary | ICD-10-CM

## 2020-04-20 DIAGNOSIS — Z794 Long term (current) use of insulin: Secondary | ICD-10-CM

## 2020-04-20 NOTE — Chronic Care Management (AMB) (Signed)
°  Chronic Care Management   Initial Visit Note  04/20/2020 Name: Morgan Schmidt MRN: [id number] DOB: 28-Apr-1951  Referred by: Morgan Fraise, MD Reason for referral : Chronic Care Management (Initial Visit)   Morgan Schmidt is a 69 y.o. year old female who is a primary care patient of Stacks, Cletus Gash, MD. The CCM team was consulted for assistance with chronic disease management and care coordination needs related to HTN, DMII and acquired hypothyroidism.  I spoke with Morgan Schmidt by telephone today regarding management of her chronic medical conditions. She does not have any resource or CCM needs and feels that her medical conditions are well managed at this time. We discussed her recent thyroid medication adjustment and I encouraged her to have her repeat labs in the near future. She is checking her blood sugar daily and PRN and her blood pressure about once a week. I encouraged her to check and record her blood pressure several times a week and to reach out to her PCP with any readings outside of the recommended range. Morgan Schmidt appreciated the telephone call and will reach out to the CCM team if assistance is needed in the future.   SDOH (Social Determinants of Health) assessments performed: Yes See Care Plan activities for detailed interventions related to SDOH     Plan:  CCM enrollment status changed to "previously enrolled" as per patient request on 04/20/20 to discontinue enrollment. Case closed to case management services in primary care home.   Chong Sicilian, BSN, RN-BC Embedded Chronic Care Manager [facility] / [facility] Direct Dial: [phone]

## 2020-04-20 NOTE — Patient Instructions (Signed)
Kohler Pellerito, BSN, RN-BC Embedded Chronic Care Manager [facility] Direct Dial: [phone]    

## 2020-04-22 ENCOUNTER — Other Ambulatory Visit: Payer: Self-pay

## 2020-04-22 ENCOUNTER — Other Ambulatory Visit: Payer: Medicare HMO

## 2020-04-22 DIAGNOSIS — E038 Other specified hypothyroidism: Secondary | ICD-10-CM | POA: Diagnosis not present

## 2020-04-22 DIAGNOSIS — E039 Hypothyroidism, unspecified: Secondary | ICD-10-CM

## 2020-04-23 LAB — T4 AND TSH
T4, Total: 12.5 ug/dL — ABNORMAL HIGH (ref 4.5–12.0)
TSH: 1.17 u[IU]/mL (ref 0.450–4.500)

## 2020-04-23 LAB — T4, FREE: Free T4: 1.66 ng/dL (ref 0.82–1.77)

## 2020-04-24 NOTE — Progress Notes (Signed)
Hello Shelsy,  Your lab result is normal and/or stable.Some minor variations that are not significant are commonly marked abnormal, but do not represent any medical problem for you.  Best regards, Makaria Poarch, M.D.

## 2020-05-11 ENCOUNTER — Ambulatory Visit: Payer: Medicare HMO | Admitting: Family Medicine

## 2020-05-26 ENCOUNTER — Other Ambulatory Visit: Payer: Self-pay

## 2020-05-26 ENCOUNTER — Ambulatory Visit: Payer: Medicare HMO

## 2020-05-26 ENCOUNTER — Encounter: Payer: Self-pay | Admitting: Family Medicine

## 2020-05-26 ENCOUNTER — Ambulatory Visit (INDEPENDENT_AMBULATORY_CARE_PROVIDER_SITE_OTHER): Payer: Medicare HMO | Admitting: Family Medicine

## 2020-05-26 VITALS — BP 137/75 | HR 87 | Temp 97.0°F | Resp 20 | Ht 59.0 in | Wt 173.0 lb

## 2020-05-26 DIAGNOSIS — Z794 Long term (current) use of insulin: Secondary | ICD-10-CM

## 2020-05-26 DIAGNOSIS — E1169 Type 2 diabetes mellitus with other specified complication: Secondary | ICD-10-CM

## 2020-05-26 DIAGNOSIS — E118 Type 2 diabetes mellitus with unspecified complications: Secondary | ICD-10-CM | POA: Diagnosis not present

## 2020-05-26 DIAGNOSIS — E782 Mixed hyperlipidemia: Secondary | ICD-10-CM

## 2020-05-26 DIAGNOSIS — Z780 Asymptomatic menopausal state: Secondary | ICD-10-CM

## 2020-05-26 DIAGNOSIS — I10 Essential (primary) hypertension: Secondary | ICD-10-CM | POA: Diagnosis not present

## 2020-05-26 DIAGNOSIS — E039 Hypothyroidism, unspecified: Secondary | ICD-10-CM | POA: Diagnosis not present

## 2020-05-26 DIAGNOSIS — E038 Other specified hypothyroidism: Secondary | ICD-10-CM

## 2020-05-26 DIAGNOSIS — Z23 Encounter for immunization: Secondary | ICD-10-CM | POA: Diagnosis not present

## 2020-05-26 LAB — BAYER DCA HB A1C WAIVED: HB A1C (BAYER DCA - WAIVED): 7.7 % — ABNORMAL HIGH (ref <7.0)

## 2020-05-26 MED ORDER — TRULICITY 3 MG/0.5ML ~~LOC~~ SOAJ
3.0000 mg | SUBCUTANEOUS | 3 refills | Status: DC
Start: 2020-05-26 — End: 2021-05-10

## 2020-05-26 MED ORDER — LEVOTHYROXINE SODIUM 125 MCG PO TABS: 125.0000 ug | ORAL_TABLET | ORAL | 3 refills | Status: DC

## 2020-05-26 MED ORDER — ROSUVASTATIN CALCIUM 10 MG PO TABS: 10.0000 mg | ORAL_TABLET | Freq: Every day | ORAL | 1 refills | Status: DC

## 2020-05-26 MED ORDER — LEVOTHYROXINE SODIUM 112 MCG PO TABS: 112.0000 ug | ORAL_TABLET | ORAL | 3 refills | Status: DC

## 2020-05-26 MED ORDER — METFORMIN HCL 1000 MG PO TABS: ORAL_TABLET | ORAL | 0 refills | Status: DC

## 2020-05-26 MED ORDER — UNIFINE PENTIPS 31G X 8 MM MISC: 3 refills | Status: DC

## 2020-05-26 MED ORDER — OLMESARTAN MEDOXOMIL 20 MG PO TABS: 20.0000 mg | ORAL_TABLET | Freq: Every day | ORAL | 1 refills | Status: DC

## 2020-05-26 MED ORDER — LANTUS SOLOSTAR 100 UNIT/ML ~~LOC~~ SOPN: 110.0000 [IU] | PEN_INJECTOR | Freq: Every day | SUBCUTANEOUS | 3 refills | Status: DC

## 2020-05-26 NOTE — Progress Notes (Signed)
Subjective:  [name], female    DOB: [birth year]  Age: 69 y.o. MRN: [id number]  CC: Medical Management of Chronic Issues   HPI SHAWNAY BRAMEL presents forFollow-up of diabetes. Patient checks blood sugar at home.   90-110 fasting and <180 postprandial Patient denies symptoms such as polyuria, polydipsia, excessive hunger, nausea No significant hypoglycemic spells noted. Medications reviewed. Pt reports taking them regularly without complication/adverse reaction being reported today.  Patient said she got very lethargic very quickly when she decreased her thyroid dose so he decided to alternate them 112's with the 125.  And now her energy is back to normal.  History Kialee has a past medical history of Arthritis, Cancer ([place]), Diabetes mellitus, Hip fracture ([place]), and Thyroid disease.   She has a past surgical history that includes Thyroidectomy (2009); Cesarean section; Knee arthroscopy (Left, 10/22/2016); Knee arthroscopy with medial menisectomy (10/22/2016); Knee arthroscopy with subchondroplasty (Left, 10/22/2016); and Hip pinning, cannulated (Left, 03/10/2018).   Her family history is not on file.She reports that she quit smoking about 19 years ago. She has never used smokeless tobacco. She reports that she does not drink alcohol and does not use drugs.  Current Outpatient Medications on File Prior to Visit  Medication Sig Dispense Refill  . Accu-Chek FastClix Lancets MISC Use to check Blood Sugars twice daily. Dx E11.8 204 each 3  . acetaminophen (TYLENOL) 325 MG tablet Take 325-650 mg by mouth every 6 (six) hours as needed (for pain or headaches).    . Alcohol Swabs (B-D SINGLE USE SWABS REGULAR) PADS Use to check Blood Sugars twice daily. Dx E11.8 200 each 3  . Blood Glucose Calibration (ACCU-CHEK AVIVA) SOLN Use to check Blood Sugars twice daily. Dx E11.8 1 each 3  . Blood Glucose Monitoring Suppl (ACCU-CHEK AVIVA PLUS) w/Device KIT Use to check Blood Sugars twice  daily. Dx E11.8 1 kit 0  . Cholecalciferol (VITAMIN D) 2000 UNITS tablet Take 1 tablet (2,000 Units total) by mouth daily. (Patient taking differently: Take 2,000 Units by mouth every evening. ) 30 tablet 11  . diclofenac sodium (VOLTAREN) 1 % GEL Apply 2-4 g topically See admin instructions. Apply 2-4 grams to each hand up to 4 times a day as needed for arthritis in the fingers 100 g 11  . glucose blood (ACCU-CHEK AVIVA PLUS) test strip Use to check Blood Sugars twice daily. Dx E11.8 200 each 3   No current facility-administered medications on file prior to visit.    ROS Review of Systems  Constitutional: Negative.   HENT: Negative.   Eyes: Negative for visual disturbance.  Respiratory: Negative for shortness of breath.   Cardiovascular: Negative for chest pain.  Gastrointestinal: Negative for abdominal pain.  Musculoskeletal: Negative for arthralgias.    Objective:  BP 137/75   Pulse 87   Temp (!) 97 F (36.1 C) (Temporal)   Resp 20   Ht 4' 11"  (1.499 m)   Wt 173 lb (78.5 kg)   LMP 03/02/2004   SpO2 95%   BMI 34.94 kg/m   BP Readings from Last 3 Encounters:  05/26/20 137/75  02/09/20 131/61  10/26/19 138/74    Wt Readings from Last 3 Encounters:  05/26/20 173 lb (78.5 kg)  02/09/20 172 lb (78 kg)  10/26/19 170 lb 8 oz (77.3 kg)     Physical Exam Constitutional:      General: She is not in acute distress.    Appearance: She is well-developed.  HENT:  Head: Normocephalic and atraumatic.  Eyes:     Conjunctiva/sclera: Conjunctivae normal.     Pupils: Pupils are equal, round, and reactive to light.  Neck:     Thyroid: No thyromegaly.  Cardiovascular:     Rate and Rhythm: Normal rate and regular rhythm.     Heart sounds: Normal heart sounds. No murmur heard.   Pulmonary:     Effort: Pulmonary effort is normal. No respiratory distress.     Breath sounds: Normal breath sounds. No wheezing or rales.  Abdominal:     General: Bowel sounds are normal. There  is no distension.     Palpations: Abdomen is soft.     Tenderness: There is no abdominal tenderness.  Musculoskeletal:        General: Normal range of motion.     Cervical back: Normal range of motion and neck supple.  Lymphadenopathy:     Cervical: No cervical adenopathy.  Skin:    General: Skin is warm and dry.  Neurological:     Mental Status: She is alert and oriented to person, place, and time.  Psychiatric:        Behavior: Behavior normal.        Thought Content: Thought content normal.        Judgment: Judgment normal.       Assessment & Plan:   Destiny was seen today for medical management of chronic issues.  Diagnoses and all orders for this visit:  Hypothyroidism, unspecified type -     Bayer DCA Hb A1c Waived -     CBC with Differential/Platelet -     CMP14+EGFR -     Lipid panel -     Thyroid Panel With TSH  Controlled type 2 diabetes mellitus with complication, with long-term current use of insulin (HCC) -     Bayer DCA Hb A1c Waived -     CBC with Differential/Platelet -     CMP14+EGFR -     Lipid panel -     Thyroid Panel With TSH -     Insulin Pen Needle (UNIFINE PENTIPS) 31G X 8 MM MISC; Use as directed daily Dx E 11.69 -     olmesartan (BENICAR) 20 MG tablet; Take 1 tablet (20 mg total) by mouth daily. -     metFORMIN (GLUCOPHAGE) 1000 MG tablet; TAKE 1 TABLET BY MOUTH 2 TIMES DAILY WITH A MEAL. -     insulin glargine (LANTUS SOLOSTAR) 100 UNIT/ML Solostar Pen; Inject 110 Units into the skin daily after breakfast.  Essential hypertension -     Bayer DCA Hb A1c Waived -     CBC with Differential/Platelet -     CMP14+EGFR -     Lipid panel -     Thyroid Panel With TSH -     olmesartan (BENICAR) 20 MG tablet; Take 1 tablet (20 mg total) by mouth daily.  Mixed diabetic hyperlipidemia associated with type 2 diabetes mellitus (HCC) -     Bayer DCA Hb A1c Waived -     CBC with Differential/Platelet -     CMP14+EGFR -     Lipid panel -     Thyroid  Panel With TSH -     rosuvastatin (CRESTOR) 10 MG tablet; Take 1 tablet (10 mg total) by mouth daily. For cholesterol  Need for immunization against influenza -     Flu Vaccine QUAD High Dose(Fluad)  Other specified hypothyroidism -     levothyroxine (SYNTHROID) 112 MCG tablet;  Take 1 tablet (112 mcg total) by mouth every other day. Alternate with 125  Post-menopause -     DG Bone Density; Future  Other orders -     levothyroxine (SYNTHROID) 125 MCG tablet; Take 1 tablet (125 mcg total) by mouth every other day. Alternate with 112 -     Dulaglutide (TRULICITY) 3 JO/8.7OM SOPN; Inject 3 mg into the skin once a week.      I have changed Salomon Mast. Scorsone's levothyroxine and Trulicity. I am also having her start on levothyroxine. Additionally, I am having her maintain her Vitamin D, acetaminophen, diclofenac sodium, Accu-Chek Aviva Plus, Accu-Chek Aviva, Accu-Chek Aviva Plus, B-D SINGLE USE SWABS REGULAR, Accu-Chek FastClix Lancets, Unifine Pentips, rosuvastatin, olmesartan, metFORMIN, and Lantus SoloStar.  Meds ordered this encounter  Medications  . Insulin Pen Needle (UNIFINE PENTIPS) 31G X 8 MM MISC    Sig: Use as directed daily Dx E 11.69    Dispense:  100 each    Refill:  3  . rosuvastatin (CRESTOR) 10 MG tablet    Sig: Take 1 tablet (10 mg total) by mouth daily. For cholesterol    Dispense:  90 tablet    Refill:  1  . olmesartan (BENICAR) 20 MG tablet    Sig: Take 1 tablet (20 mg total) by mouth daily.    Dispense:  90 tablet    Refill:  1  . metFORMIN (GLUCOPHAGE) 1000 MG tablet    Sig: TAKE 1 TABLET BY MOUTH 2 TIMES DAILY WITH A MEAL.    Dispense:  180 tablet    Refill:  0  . levothyroxine (SYNTHROID) 112 MCG tablet    Sig: Take 1 tablet (112 mcg total) by mouth every other day. Alternate with 125    Dispense:  45 tablet    Refill:  3  . levothyroxine (SYNTHROID) 125 MCG tablet    Sig: Take 1 tablet (125 mcg total) by mouth every other day. Alternate with 112     Dispense:  45 tablet    Refill:  3  . insulin glargine (LANTUS SOLOSTAR) 100 UNIT/ML Solostar Pen    Sig: Inject 110 Units into the skin daily after breakfast.    Dispense:  105 mL    Refill:  3  . Dulaglutide (TRULICITY) 3 VE/7.2CN SOPN    Sig: Inject 3 mg into the skin once a week.    Dispense:  6 mL    Refill:  3     Follow-up: Return in about 3 months (around 08/26/2020).  Claretta Fraise, M.D.

## 2020-05-27 LAB — CBC WITH DIFFERENTIAL/PLATELET
Basophils Absolute: 0.1 10*3/uL (ref 0.0–0.2)
Basos: 1 %
EOS (ABSOLUTE): 0.1 10*3/uL (ref 0.0–0.4)
Eos: 2 %
Hematocrit: 41.8 % (ref 34.0–46.6)
Hemoglobin: 13.9 g/dL (ref 11.1–15.9)
Immature Grans (Abs): 0 10*3/uL (ref 0.0–0.1)
Immature Granulocytes: 0 %
Lymphocytes Absolute: 2.3 10*3/uL (ref 0.7–3.1)
Lymphs: 30 %
MCH: 27.4 pg (ref 26.6–33.0)
MCHC: 33.3 g/dL (ref 31.5–35.7)
MCV: 82 fL (ref 79–97)
Monocytes Absolute: 0.4 10*3/uL (ref 0.1–0.9)
Monocytes: 5 %
Neutrophils Absolute: 4.8 10*3/uL (ref 1.4–7.0)
Neutrophils: 62 %
Platelets: 252 10*3/uL (ref 150–450)
RBC: 5.07 x10E6/uL (ref 3.77–5.28)
RDW: 14.1 % (ref 11.7–15.4)
WBC: 7.7 10*3/uL (ref 3.4–10.8)

## 2020-05-27 LAB — CMP14+EGFR
ALT: 19 IU/L (ref 0–32)
AST: 23 IU/L (ref 0–40)
Albumin/Globulin Ratio: 2.4 — ABNORMAL HIGH (ref 1.2–2.2)
Albumin: 4.8 g/dL (ref 3.8–4.8)
Alkaline Phosphatase: 82 IU/L (ref 44–121)
BUN/Creatinine Ratio: 12 (ref 12–28)
BUN: 8 mg/dL (ref 8–27)
Bilirubin Total: 0.8 mg/dL (ref 0.0–1.2)
CO2: 23 mmol/L (ref 20–29)
Calcium: 8.5 mg/dL — ABNORMAL LOW (ref 8.7–10.3)
Chloride: 101 mmol/L (ref 96–106)
Creatinine, Ser: 0.67 mg/dL (ref 0.57–1.00)
GFR calc Af Amer: 105 mL/min/{1.73_m2} (ref >59)
GFR calc non Af Amer: 91 mL/min/{1.73_m2} (ref >59)
Globulin, Total: 2 g/dL (ref 1.5–4.5)
Glucose: 113 mg/dL — ABNORMAL HIGH (ref 65–99)
Potassium: 4.3 mmol/L (ref 3.5–5.2)
Sodium: 139 mmol/L (ref 134–144)
Total Protein: 6.8 g/dL (ref 6.0–8.5)

## 2020-05-27 LAB — THYROID PANEL WITH TSH
Free Thyroxine Index: 3.3 (ref 1.2–4.9)
T3 Uptake Ratio: 26 % (ref 24–39)
T4, Total: 12.7 ug/dL — ABNORMAL HIGH (ref 4.5–12.0)
TSH: 1.11 u[IU]/mL (ref 0.450–4.500)

## 2020-05-27 LAB — LIPID PANEL
Chol/HDL Ratio: 3.5 ratio (ref 0.0–4.4)
Cholesterol, Total: 140 mg/dL (ref 100–199)
HDL: 40 mg/dL (ref >39)
LDL Chol Calc (NIH): 68 mg/dL (ref 0–99)
Triglycerides: 191 mg/dL — ABNORMAL HIGH (ref 0–149)
VLDL Cholesterol Cal: 32 mg/dL (ref 5–40)

## 2020-05-28 NOTE — Progress Notes (Signed)
Hello Morgan Schmidt,  Your lab result is normal and/or stable.Some minor variations that are not significant are commonly marked abnormal, but do not represent any medical problem for you.  Best regards, Dmario Russom, M.D.

## 2020-05-31 ENCOUNTER — Other Ambulatory Visit: Payer: Self-pay

## 2020-05-31 ENCOUNTER — Ambulatory Visit (INDEPENDENT_AMBULATORY_CARE_PROVIDER_SITE_OTHER): Payer: Medicare HMO

## 2020-05-31 DIAGNOSIS — Z780 Asymptomatic menopausal state: Secondary | ICD-10-CM | POA: Diagnosis not present

## 2020-05-31 DIAGNOSIS — M85851 Other specified disorders of bone density and structure, right thigh: Secondary | ICD-10-CM | POA: Diagnosis not present

## 2020-06-08 ENCOUNTER — Ambulatory Visit: Payer: Medicare HMO | Admitting: Pharmacist

## 2020-07-29 ENCOUNTER — Encounter: Payer: Self-pay | Admitting: *Deleted

## 2020-08-25 ENCOUNTER — Ambulatory Visit: Payer: Medicare HMO | Admitting: Family Medicine

## 2020-09-01 ENCOUNTER — Ambulatory Visit: Payer: Medicare HMO | Admitting: Family Medicine

## 2020-09-21 ENCOUNTER — Ambulatory Visit: Payer: Medicare HMO | Admitting: Family Medicine

## 2020-09-28 ENCOUNTER — Ambulatory Visit (INDEPENDENT_AMBULATORY_CARE_PROVIDER_SITE_OTHER): Payer: Medicare HMO

## 2020-09-28 DIAGNOSIS — Z0000 Encounter for general adult medical examination without abnormal findings: Secondary | ICD-10-CM

## 2020-09-28 NOTE — Progress Notes (Signed)
MEDICARE ANNUAL WELLNESS VISIT  09/28/2020  Telephone Visit Disclaimer This Medicare AWV was conducted by telephone due to national recommendations for restrictions regarding the COVID-19 Pandemic (e.g. social distancing).  I verified, using two identifiers, that I am speaking with Morgan Schmidt or their authorized healthcare agent. I discussed the limitations, risks, security, and privacy concerns of performing an evaluation and management service by telephone and the potential availability of an in-person appointment in the future. The patient expressed understanding and agreed to proceed.  Location of Patient: Home Location of Provider (nurse):  [facility]  Subjective:    Morgan Schmidt is a 70 y.o. female patient of Stacks, Cletus Gash, MD who had a Medicare Annual Wellness Visit today via telephone.Marinna lives locally here in [place] with her boyfriend. She has 3 children that all live close by. 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 She enjoys sewing, cooking, and baking. She exercises regularly by walking and using her exercise bike. She does suffer from occasional knee pain but states that she doesn't take any medications for it.   Patient Care Team: Claretta Fraise, MD as PCP - General (Family Medicine)  Advanced Directives 09/28/2020 09/25/2019 03/09/2018 11/07/2016 10/22/2016 10/18/2016 06/11/2016  Does Patient Have a Medical Advance Directive? No No No No No No No  Would patient like information on creating a medical advance directive? No - Patient declined No - Patient declined No - Patient declined - Yes (MAU/Ambulatory/Procedural Areas - Information given) No - Patient declined Yes - Educational materials given    Hospital Utilization Over the Past 12 Months: # of hospitalizations or ER visits: 0 # of surgeries: 0  Review of Systems    Patient reports that her overall health is unchanged compared to last year.  History  obtained from chart review  Patient Reported Readings (BP, Pulse, CBG, Weight, etc) none  Pain Assessment Pain : No/denies pain     Current Medications & Allergies (verified) Allergies as of 09/28/2020      Reactions   Lipitor [atorvastatin Calcium]    Ace Inhibitors Cough      Medication List       Accurate as of September 28, 2020  2:50 PM. If you have any questions, ask your nurse or doctor.        Accu-Chek Aviva Plus test strip Generic drug: glucose blood Use to check Blood Sugars twice daily. Dx E11.8   Accu-Chek Aviva Plus w/Device Kit Use to check Blood Sugars twice daily. Dx E11.8   Accu-Chek Aviva Soln Use to check Blood Sugars twice daily. Dx E11.8   Accu-Chek FastClix Lancets Misc Use to check Blood Sugars twice daily. Dx E11.8   acetaminophen 325 MG tablet Commonly known as: TYLENOL Take 325-650 mg by mouth every 6 (six) hours as needed (for pain or headaches).   B-D SINGLE USE SWABS REGULAR Pads Use to check Blood Sugars twice daily. Dx E11.8   diclofenac sodium 1 % Gel Commonly known as: VOLTAREN Apply 2-4 g topically See admin instructions. Apply 2-4 grams to each hand up to 4 times a day as needed for arthritis in the fingers   Lantus SoloStar 100 UNIT/ML Solostar Pen Generic drug: insulin glargine Inject 110 Units into the skin daily after breakfast.   levothyroxine 112 MCG tablet Commonly known as: SYNTHROID Take 1 tablet (112 mcg total) by mouth every other day. Alternate with 125   levothyroxine 125 MCG tablet Commonly  known as: SYNTHROID Take 1 tablet (125 mcg total) by mouth every other day. Alternate with 112   metFORMIN 1000 MG tablet Commonly known as: GLUCOPHAGE TAKE 1 TABLET BY MOUTH 2 TIMES DAILY WITH A MEAL.   olmesartan 20 MG tablet Commonly known as: BENICAR Take 1 tablet (20 mg total) by mouth daily.   rosuvastatin 10 MG tablet Commonly known as: Crestor Take 1 tablet (10 mg total) by mouth daily. For cholesterol    Trulicity 3 DP/8.2UM Sopn Generic drug: Dulaglutide Inject 3 mg into the skin once a week.   Unifine Pentips 31G X 8 MM Misc Generic drug: Insulin Pen Needle Use as directed daily Dx E 11.69   Vitamin D 50 MCG (2000 UT) tablet Take 1 tablet (2,000 Units total) by mouth daily. What changed: when to take this       History (reviewed): Past Medical History:  Diagnosis Date  . Arthritis   . Cancer ([place])    thyroid CA  . Diabetes mellitus    type 2  . Hip fracture ([place])   . Thyroid disease    Past Surgical History:  Procedure Laterality Date  . CESAREAN SECTION    . HIP PINNING,CANNULATED Left 03/10/2018   Procedure: CANNULATED HIP PINNING;  Surgeon: Paralee Cancel, MD;  Location: [facility];  Service: Orthopedics;  Laterality: Left;  . KNEE ARTHROSCOPY Left 10/22/2016   Procedure: ARTHROSCOPY LEFT KNEE WITH DEBRIDEMENT;  Surgeon: Paralee Cancel, MD;  [facility];  Service: Orthopedics;  Laterality: Left;  . KNEE ARTHROSCOPY WITH MEDIAL MENISECTOMY  10/22/2016   Procedure: KNEE ARTHROSCOPY WITH MEDIAL MENISECTOMY;  Surgeon: Paralee Cancel, MD;  Location: [facility];  Service: Orthopedics;;  . KNEE ARTHROSCOPY WITH SUBCHONDROPLASTY Left 10/22/2016   Procedure: KNEE ARTHROSCOPY WITH MEDIAL CHONDROPLASTY;  Surgeon: Paralee Cancel, MD;  Location: [facility];  Service: Orthopedics;  Laterality: Left;  . THYROIDECTOMY  2009   Family History  Problem Relation Age of Onset  . Heart disease Mother   . Colon cancer Neg Hx   . Esophageal cancer Neg Hx   . Rectal cancer Neg Hx   . Stomach cancer Neg Hx   . Breast cancer Neg Hx    Social History   Socioeconomic History  . Marital status: Significant Other    Spouse name: Not on file  . Number of children: 3  . Years of education: 48  . Highest education level: High school graduate  Occupational History  . Occupation: retired  Tobacco Use  . Smoking status: Former Smoker    Quit date:  08/23/2000    Years since quitting: 20.1  . Smokeless tobacco: Never Used  Vaping Use  . Vaping Use: Never used  Substance and Sexual Activity  . Alcohol use: No  . Drug use: No  . Sexual activity: Yes  Other Topics Concern  . Not on file  Social History Narrative  . Not on file   Social Determinants of Health   Financial Resource Strain: Low Risk   . Difficulty of Paying Living Expenses: Not hard at all  Food Insecurity: No Food Insecurity  . Worried About Charity fundraiser in the Last Year: Never true  . Ran Out of Food in the Last Year: Never true  Transportation Needs: No Transportation Needs  . Lack of Transportation (Medical): No  . Lack of Transportation (Non-Medical): No  Physical Activity: Not on file  Stress: Not on file  Social Connections: Not on  file    Activities of Daily Living No flowsheet data found.  Patient Education/ Literacy How often do you need to have someone help you when you read instructions, pamphlets, or other written materials from your doctor or pharmacy?: 1 - Never What is the last grade level you completed in school?: 12th grade  Exercise    Diet Patient reports consuming 3 meals a day and 2 snack(s) a day Patient reports that her primary diet is: Regular Patient reports that she does have regular access to food.   Depression Screen PHQ 2/9 Scores 09/28/2020 05/26/2020 02/09/2020 10/26/2019 09/25/2019 04/14/2019 09/23/2018  PHQ - 2 Score 0 0 0 0 0 0 0     Fall Risk Fall Risk  09/28/2020 05/26/2020 02/09/2020 10/26/2019 09/25/2019  Falls in the past year? 0 0 0 0 0  Number falls in past yr: - - - - -  Injury with Fall? - - - - -  [place] for fall due to : - - - - -  Follow up - Falls evaluation completed Falls evaluation completed - -     Objective:  Morgan Schmidt seemed alert and oriented and she participated appropriately during our telephone visit.  Blood Pressure Weight BMI  BP Readings from Last 3 Encounters:   05/26/20 137/75  02/09/20 131/61  10/26/19 138/74   Wt Readings from Last 3 Encounters:  05/26/20 173 lb (78.5 kg)  02/09/20 172 lb (78 kg)  10/26/19 170 lb 8 oz (77.3 kg)   BMI Readings from Last 1 Encounters:  05/26/20 34.94 kg/m    *Unable to obtain current vital signs, weight, and BMI due to telephone visit type  Hearing/Vision  . Dan did not seem to have difficulty with hearing/understanding during the telephone conversation . Reports that she has had a formal eye exam by an eye care professional within the past year . Reports that she has not had a formal hearing evaluation within the past year *Unable to fully assess hearing and vision during telephone visit type  Cognitive Function: 6CIT Screen 09/28/2020 09/25/2019  What Year? 0 points 0 points  What month? 0 points 0 points  What time? 0 points 0 points  Count back from 20 0 points 0 points  Months in reverse 0 points 0 points  Repeat phrase 0 points 0 points  Total Score 0 0   (Normal:0-7, Significant for Dysfunction: >8)  Normal Cognitive Function Screening: Yes   Immunization & Health Maintenance Record Immunization History  Administered Date(s) Administered  . Fluad Quad(high Dose 65+) 07/09/2019, 05/26/2020  . Influenza, High Dose Seasonal PF 06/03/2018  . Influenza-Unspecified 05/13/2017  . Pneumococcal Conjugate-13 09/03/2018  . Pneumococcal Polysaccharide-23 10/26/2019    Health Maintenance  Topic Date Due  . COVID-19 Vaccine (1) Never done  . OPHTHALMOLOGY EXAM  07/14/2020  . TETANUS/TDAP  02/08/2021 (Originally 03/02/1970)  . Hepatitis C Screening  02/08/2021 (Originally 1950-12-26)  . FOOT EXAM  10/25/2020  . HEMOGLOBIN A1C  11/24/2020  . MAMMOGRAM  11/26/2021  . COLONOSCOPY (Pts 45-64yr Insurance coverage will need to be confirmed)  12/15/2024  . INFLUENZA VACCINE  Completed  . DEXA SCAN  Completed  . PNA vac Low Risk Adult  Completed       Assessment  This is a routine wellness  examination for PSHONICE WRISLEY  Health Maintenance: Due or Overdue Health Maintenance Due  Topic Date Due  . COVID-19 Vaccine (1) Never done  . OPHTHALMOLOGY  EXAM  07/14/2020    Morgan Schmidt does not need a referral for Community Assistance: Care Management:   no Social Work:    no Prescription Assistance:  no Nutrition/Diabetes Education:  no   Plan:  Personalized Goals Goals Addressed            This Visit's Progress   . Exercise 150 min/wk Moderate Activity        Personalized Health Maintenance & Screening Recommendations    Lung Cancer Screening Recommended: no (Low Dose CT Chest recommended if Age 27-80 years, 30 pack-year currently smoking OR have quit w/in past 15 years) Hepatitis C Screening recommended: no HIV Screening recommended: no  Advanced Directives: Written information was not prepared per patient's request.  Referrals & Orders No orders of the defined types were placed in this encounter.   Follow-up Plan . Follow-up with Claretta Fraise, MD as planned     I have personally reviewed and noted the following in the patient's chart:   . Medical and social history . Use of alcohol, tobacco or illicit drugs  . Current medications and supplements . Functional ability and status . Nutritional status . Physical activity . Advanced directives . List of other physicians . Hospitalizations, surgeries, and ER visits in previous 12 months . Vitals . Screenings to include cognitive, depression, and falls . Referrals and appointments  In addition, I have reviewed and discussed with Morgan Schmidt certain preventive protocols, quality metrics, and best practice recommendations. A written personalized care plan for preventive services as well as general preventive health recommendations is available and can be mailed to the patient at her request.      Rolena Infante LPN 12/27/7414

## 2020-10-03 NOTE — Patient Instructions (Signed)
  Morgan Schmidt , Thank you for taking time to come for your Medicare Wellness Visit. I appreciate your ongoing commitment to your health goals. Please review the following plan we discussed and let me know if I can assist you in the future.   These are the goals we discussed: Goals    . DIET - DECREASE SODA OR JUICE INTAKE    . DIET - INCREASE WATER INTAKE    . DIET - REDUCE PORTION SIZE    . Exercise 150 min/wk Moderate Activity    . Weight (lb) < 110 lb (49.9 kg)       This is a list of the screening recommended for you and due dates:  Health Maintenance  Topic Date Due  . COVID-19 Vaccine (1) Never done  . Eye exam for diabetics  07/14/2020  . Tetanus Vaccine  02/08/2021*  .  Hepatitis C: One time screening is recommended by Center for Disease Control  (CDC) for  adults born from [birth year] through 1963.   02/08/2021*  . Complete foot exam   10/25/2020  . Hemoglobin A1C  11/24/2020  . Mammogram  11/26/2021  . Colon Cancer Screening  12/15/2024  . Flu Shot  Completed  . DEXA scan (bone density measurement)  Completed  . Pneumonia vaccines  Completed  *Topic was postponed. The date shown is not the original due date.

## 2020-10-04 ENCOUNTER — Other Ambulatory Visit: Payer: Self-pay

## 2020-10-04 ENCOUNTER — Encounter: Payer: Self-pay | Admitting: Family Medicine

## 2020-10-04 ENCOUNTER — Ambulatory Visit (INDEPENDENT_AMBULATORY_CARE_PROVIDER_SITE_OTHER): Payer: Medicare HMO | Admitting: Family Medicine

## 2020-10-04 VITALS — BP 139/76 | HR 80 | Temp 97.8°F | Resp 20 | Ht 59.0 in | Wt 175.5 lb

## 2020-10-04 DIAGNOSIS — E782 Mixed hyperlipidemia: Secondary | ICD-10-CM

## 2020-10-04 DIAGNOSIS — E039 Hypothyroidism, unspecified: Secondary | ICD-10-CM

## 2020-10-04 DIAGNOSIS — E1169 Type 2 diabetes mellitus with other specified complication: Secondary | ICD-10-CM | POA: Diagnosis not present

## 2020-10-04 DIAGNOSIS — Z794 Long term (current) use of insulin: Secondary | ICD-10-CM

## 2020-10-04 DIAGNOSIS — E118 Type 2 diabetes mellitus with unspecified complications: Secondary | ICD-10-CM | POA: Diagnosis not present

## 2020-10-04 LAB — BAYER DCA HB A1C WAIVED: HB A1C (BAYER DCA - WAIVED): 7.9 % — ABNORMAL HIGH (ref <7.0)

## 2020-10-04 MED ORDER — METFORMIN HCL 1000 MG PO TABS: ORAL_TABLET | ORAL | 1 refills | Status: DC

## 2020-10-04 NOTE — Progress Notes (Signed)
Subjective:  [name], female    DOB: [birth year]  Age: 70 y.o. MRN: [id number]  CC: Medical Management of Chronic Issues   HPI Morgan Schmidt presents forFollow-up of diabetes. Patient checks blood sugar at home.   120 fasting and under 200 postprandial Patient denies symptoms such as polyuria, polydipsia, excessive hunger, nausea No significant hypoglycemic spells noted. Medications reviewed. Pt reports taking them regularly without complication/adverse reaction being reported today.  Checking feet daily.   History Morgan Schmidt has a past medical history of Arthritis, Cancer ([place]), Diabetes mellitus, Hip fracture ([place]), and Thyroid disease.   She has a past surgical history that includes Thyroidectomy (2009); Cesarean section; Knee arthroscopy (Left, 10/22/2016); Knee arthroscopy with medial menisectomy (10/22/2016); Knee arthroscopy with subchondroplasty (Left, 10/22/2016); and Hip pinning, cannulated (Left, 03/10/2018).   Her family history includes Heart disease in her mother.She reports that she quit smoking about 20 years ago. She has never used smokeless tobacco. She reports that she does not drink alcohol and does not use drugs.  Current Outpatient Medications on File Prior to Visit  Medication Sig Dispense Refill  . Accu-Chek FastClix Lancets MISC Use to check Blood Sugars twice daily. Dx E11.8 204 each 3  . acetaminophen (TYLENOL) 325 MG tablet Take 325-650 mg by mouth every 6 (six) hours as needed (for pain or headaches).    . Alcohol Swabs (B-D SINGLE USE SWABS REGULAR) PADS Use to check Blood Sugars twice daily. Dx E11.8 200 each 3  . Blood Glucose Calibration (ACCU-CHEK AVIVA) SOLN Use to check Blood Sugars twice daily. Dx E11.8 1 each 3  . Blood Glucose Monitoring Suppl (ACCU-CHEK AVIVA PLUS) w/Device KIT Use to check Blood Sugars twice daily. Dx E11.8 1 kit 0  . Cholecalciferol (VITAMIN D) 2000 UNITS tablet Take 1 tablet (2,000 Units total) by mouth daily.  (Patient taking differently: Take 2,000 Units by mouth every evening.) 30 tablet 11  . diclofenac sodium (VOLTAREN) 1 % GEL Apply 2-4 g topically See admin instructions. Apply 2-4 grams to each hand up to 4 times a day as needed for arthritis in the fingers 100 g 11  . Dulaglutide (TRULICITY) 3 TZ/0.0FV SOPN Inject 3 mg into the skin once a week. 6 mL 3  . glucose blood (ACCU-CHEK AVIVA PLUS) test strip Use to check Blood Sugars twice daily. Dx E11.8 200 each 3  . insulin glargine (LANTUS SOLOSTAR) 100 UNIT/ML Solostar Pen Inject 110 Units into the skin daily after breakfast. 105 mL 3  . Insulin Pen Needle (UNIFINE PENTIPS) 31G X 8 MM MISC Use as directed daily Dx E 11.69 100 each 3  . levothyroxine (SYNTHROID) 112 MCG tablet Take 1 tablet (112 mcg total) by mouth every other day. Alternate with 125 45 tablet 3  . levothyroxine (SYNTHROID) 125 MCG tablet Take 1 tablet (125 mcg total) by mouth every other day. Alternate with 112 45 tablet 3  . olmesartan (BENICAR) 20 MG tablet Take 1 tablet (20 mg total) by mouth daily. 90 tablet 1  . rosuvastatin (CRESTOR) 10 MG tablet Take 1 tablet (10 mg total) by mouth daily. For cholesterol 90 tablet 1   No current facility-administered medications on file prior to visit.    ROS Review of Systems  Constitutional: Negative.   HENT: Positive for congestion (with URI 3 weeks ago. ).   Eyes: Negative for visual disturbance.  Respiratory: Negative for shortness of breath.   Cardiovascular: Negative for chest pain.  Gastrointestinal: Negative for abdominal pain.  Musculoskeletal: Negative for arthralgias.  Neurological: Positive for headaches (left temple).    Objective:  BP 139/76   Pulse 80   Temp 97.8 F (36.6 C) (Temporal)   Resp 20   Ht 4' 11"  (1.499 m)   Wt 175 lb 8 oz (79.6 kg)   LMP 03/02/2004   SpO2 95%   BMI 35.45 kg/m   BP Readings from Last 3 Encounters:  10/04/20 139/76  05/26/20 137/75  02/09/20 131/61    Wt Readings from Last  3 Encounters:  10/04/20 175 lb 8 oz (79.6 kg)  05/26/20 173 lb (78.5 kg)  02/09/20 172 lb (78 kg)     Physical Exam Constitutional:      General: She is not in acute distress.    Appearance: She is well-developed.  HENT:     Head: Normocephalic and atraumatic.  Eyes:     Conjunctiva/sclera: Conjunctivae normal.     Pupils: Pupils are equal, round, and reactive to light.  Neck:     Thyroid: No thyromegaly.  Cardiovascular:     Rate and Rhythm: Normal rate and regular rhythm.     Heart sounds: Normal heart sounds. No murmur heard.   Pulmonary:     Effort: Pulmonary effort is normal. No respiratory distress.     Breath sounds: Normal breath sounds. No wheezing or rales.  Abdominal:     General: Bowel sounds are normal. There is no distension.     Palpations: Abdomen is soft.     Tenderness: There is no abdominal tenderness.  Musculoskeletal:        General: Normal range of motion.     Cervical back: Normal range of motion and neck supple.  Lymphadenopathy:     Cervical: No cervical adenopathy.  Skin:    General: Skin is warm and dry.  Neurological:     Mental Status: She is alert and oriented to person, place, and time.  Psychiatric:        Behavior: Behavior normal.        Thought Content: Thought content normal.        Judgment: Judgment normal.       Assessment & Plan:   Morgan Schmidt was seen today for medical management of chronic issues.  Diagnoses and all orders for this visit:  Hypothyroidism, unspecified type -     CBC with Differential/Platelet -     CMP14+EGFR -     Lipid panel -     Thyroid Panel With TSH  Controlled type 2 diabetes mellitus with complication, with long-term current use of insulin (HCC) -     Bayer DCA Hb A1c Waived -     CBC with Differential/Platelet -     CMP14+EGFR -     Lipid panel -     metFORMIN (GLUCOPHAGE) 1000 MG tablet; TAKE 1 TABLET BY MOUTH 2 TIMES DAILY WITH A MEAL.  Mixed diabetic hyperlipidemia associated with type  2 diabetes mellitus ([place]) -     CBC with Differential/Platelet -     CMP14+EGFR -     Lipid panel      I am having Morgan Schmidt maintain her Vitamin D, acetaminophen, diclofenac sodium, Accu-Chek Aviva Plus, Accu-Chek Aviva, Accu-Chek Aviva Plus, B-D SINGLE USE SWABS REGULAR, Accu-Chek FastClix Lancets, Unifine Pentips, rosuvastatin, olmesartan, levothyroxine, levothyroxine, Lantus SoloStar, Trulicity, and metFORMIN.  Meds ordered this encounter  Medications  . metFORMIN (GLUCOPHAGE) 1000 MG tablet    Sig: TAKE 1 TABLET BY MOUTH 2 TIMES DAILY WITH A  MEAL.    Dispense:  180 tablet    Refill:  1     Follow-up: Return in about 3 months (around 01/01/2021).  Claretta Fraise, M.D.

## 2020-10-05 ENCOUNTER — Other Ambulatory Visit: Payer: Self-pay | Admitting: Family Medicine

## 2020-10-05 DIAGNOSIS — E1169 Type 2 diabetes mellitus with other specified complication: Secondary | ICD-10-CM

## 2020-10-05 DIAGNOSIS — E782 Mixed hyperlipidemia: Secondary | ICD-10-CM

## 2020-10-05 LAB — CBC WITH DIFFERENTIAL/PLATELET
Basophils Absolute: 0.1 10*3/uL (ref 0.0–0.2)
Basos: 1 %
EOS (ABSOLUTE): 0.2 10*3/uL (ref 0.0–0.4)
Eos: 3 %
Hematocrit: 43.9 % (ref 34.0–46.6)
Hemoglobin: 13.9 g/dL (ref 11.1–15.9)
Immature Grans (Abs): 0.1 10*3/uL (ref 0.0–0.1)
Immature Granulocytes: 1 %
Lymphocytes Absolute: 1.9 10*3/uL (ref 0.7–3.1)
Lymphs: 30 %
MCH: 26.5 pg — ABNORMAL LOW (ref 26.6–33.0)
MCHC: 31.7 g/dL (ref 31.5–35.7)
MCV: 84 fL (ref 79–97)
Monocytes Absolute: 0.3 10*3/uL (ref 0.1–0.9)
Monocytes: 5 %
Neutrophils Absolute: 3.8 10*3/uL (ref 1.4–7.0)
Neutrophils: 60 %
Platelets: 222 10*3/uL (ref 150–450)
RBC: 5.25 x10E6/uL (ref 3.77–5.28)
RDW: 13.6 % (ref 11.7–15.4)
WBC: 6.4 10*3/uL (ref 3.4–10.8)

## 2020-10-05 LAB — LIPID PANEL
Chol/HDL Ratio: 5 ratio — ABNORMAL HIGH (ref 0.0–4.4)
Cholesterol, Total: 176 mg/dL (ref 100–199)
HDL: 35 mg/dL — ABNORMAL LOW (ref >39)
LDL Chol Calc (NIH): 89 mg/dL (ref 0–99)
Triglycerides: 314 mg/dL — ABNORMAL HIGH (ref 0–149)
VLDL Cholesterol Cal: 52 mg/dL — ABNORMAL HIGH (ref 5–40)

## 2020-10-05 LAB — CMP14+EGFR
ALT: 16 IU/L (ref 0–32)
AST: 22 IU/L (ref 0–40)
Albumin/Globulin Ratio: 2.3 — ABNORMAL HIGH (ref 1.2–2.2)
Albumin: 4.4 g/dL (ref 3.8–4.8)
Alkaline Phosphatase: 80 IU/L (ref 44–121)
BUN/Creatinine Ratio: 23 (ref 12–28)
BUN: 15 mg/dL (ref 8–27)
Bilirubin Total: 0.5 mg/dL (ref 0.0–1.2)
CO2: 22 mmol/L (ref 20–29)
Calcium: 8.2 mg/dL — ABNORMAL LOW (ref 8.7–10.3)
Chloride: 101 mmol/L (ref 96–106)
Creatinine, Ser: 0.64 mg/dL (ref 0.57–1.00)
GFR calc Af Amer: 107 mL/min/{1.73_m2} (ref >59)
GFR calc non Af Amer: 93 mL/min/{1.73_m2} (ref >59)
Globulin, Total: 1.9 g/dL (ref 1.5–4.5)
Glucose: 153 mg/dL — ABNORMAL HIGH (ref 65–99)
Potassium: 4.2 mmol/L (ref 3.5–5.2)
Sodium: 141 mmol/L (ref 134–144)
Total Protein: 6.3 g/dL (ref 6.0–8.5)

## 2020-10-05 LAB — THYROID PANEL WITH TSH
Free Thyroxine Index: 2.9 (ref 1.2–4.9)
T3 Uptake Ratio: 25 % (ref 24–39)
T4, Total: 11.5 ug/dL (ref 4.5–12.0)
TSH: 1.98 u[IU]/mL (ref 0.450–4.500)

## 2020-10-05 MED ORDER — ROSUVASTATIN CALCIUM 20 MG PO TABS: 20.0000 mg | ORAL_TABLET | Freq: Every day | ORAL | 1 refills | Status: DC

## 2020-11-03 ENCOUNTER — Other Ambulatory Visit: Payer: Self-pay | Admitting: Family Medicine

## 2020-11-03 DIAGNOSIS — Z1231 Encounter for screening mammogram for malignant neoplasm of breast: Secondary | ICD-10-CM

## 2020-12-27 ENCOUNTER — Ambulatory Visit: Payer: Medicare HMO

## 2020-12-28 ENCOUNTER — Other Ambulatory Visit: Payer: Self-pay

## 2020-12-28 ENCOUNTER — Ambulatory Visit
Admission: RE | Admit: 2020-12-28 | Discharge: 2020-12-28 | Disposition: A | Discharge status: Home / Self Care | Payer: Medicare HMO | Source: Ambulatory Visit | Attending: Family Medicine | Admitting: Family Medicine

## 2020-12-28 DIAGNOSIS — Z1231 Encounter for screening mammogram for malignant neoplasm of breast: Secondary | ICD-10-CM

## 2020-12-29 ENCOUNTER — Encounter: Payer: Self-pay | Admitting: Family Medicine

## 2020-12-29 ENCOUNTER — Ambulatory Visit (INDEPENDENT_AMBULATORY_CARE_PROVIDER_SITE_OTHER): Payer: Medicare HMO | Admitting: Family Medicine

## 2020-12-29 VITALS — BP 135/65 | HR 75 | Temp 98.0°F | Ht 59.0 in | Wt 174.6 lb

## 2020-12-29 DIAGNOSIS — Z794 Long term (current) use of insulin: Secondary | ICD-10-CM | POA: Diagnosis not present

## 2020-12-29 DIAGNOSIS — E1169 Type 2 diabetes mellitus with other specified complication: Secondary | ICD-10-CM

## 2020-12-29 DIAGNOSIS — E782 Mixed hyperlipidemia: Secondary | ICD-10-CM | POA: Diagnosis not present

## 2020-12-29 DIAGNOSIS — I10 Essential (primary) hypertension: Secondary | ICD-10-CM | POA: Diagnosis not present

## 2020-12-29 DIAGNOSIS — K635 Polyp of colon: Secondary | ICD-10-CM

## 2020-12-29 DIAGNOSIS — E118 Type 2 diabetes mellitus with unspecified complications: Secondary | ICD-10-CM

## 2020-12-29 DIAGNOSIS — E039 Hypothyroidism, unspecified: Secondary | ICD-10-CM

## 2020-12-29 DIAGNOSIS — E038 Other specified hypothyroidism: Secondary | ICD-10-CM

## 2020-12-29 LAB — BAYER DCA HB A1C WAIVED: HB A1C (BAYER DCA - WAIVED): 7.6 % — ABNORMAL HIGH (ref <7.0)

## 2020-12-29 NOTE — Progress Notes (Signed)
Subjective:  [name],  female    DOB: [birth year]  Age: 70 y.o.    CC: Medical Management of Chronic Issues   HPI MERLENE DANTE presents for  follow-up of hypertension. Patient has no history of headache chest pain or shortness of breath or recent cough. Patient also denies symptoms of TIA such as numbness weakness lateralizing. Patient denies side effects from medication. States taking it regularly.  Patient also  in for follow-up of elevated cholesterol. Doing well without complaints on current medication. Denies side effects  including myalgia and arthralgia and nausea. Also in today for liver function testing. Currently no chest pain, shortness of breath or other cardiovascular related symptoms noted.  Follow-up of diabetes. Patient does check blood sugar at home. Patient denies symptoms such as excessive hunger or urinary frequency, excessive hunger, nausea No significant hypoglycemic spells noted. Medications reviewed. Pt reports taking them regularly. Pt. denies complication/adverse reaction today.    History Tiena has a past medical history of Arthritis, Cancer ([place]), Closed left hip fracture, initial encounter ([place]) (03/09/2018), Diabetes mellitus, Hip fracture ([place]), History of thyroid cancer (08/24/2011), and Thyroid disease.   She has a past surgical history that includes Thyroidectomy (2009); Cesarean section; Knee arthroscopy (Left, 10/22/2016); Knee arthroscopy with medial menisectomy (10/22/2016); Knee arthroscopy with subchondroplasty (Left, 10/22/2016); and Hip pinning, cannulated (Left, 03/10/2018).   Her family history includes Heart disease in her mother.She reports that she quit smoking about 20 years ago. She has never used smokeless tobacco. She reports that she does not drink alcohol and does not use drugs.  Current Outpatient Medications on File Prior to Visit  Medication Sig Dispense Refill  . Accu-Chek FastClix Lancets MISC Use to check Blood  Sugars twice daily. Dx E11.8 204 each 3  . acetaminophen (TYLENOL) 325 MG tablet Take 325-650 mg by mouth every 6 (six) hours as needed (for pain or headaches).    . Alcohol Swabs (B-D SINGLE USE SWABS REGULAR) PADS Use to check Blood Sugars twice daily. Dx E11.8 200 each 3  . Blood Glucose Calibration (ACCU-CHEK AVIVA) SOLN Use to check Blood Sugars twice daily. Dx E11.8 1 each 3  . Blood Glucose Monitoring Suppl (ACCU-CHEK AVIVA PLUS) w/Device KIT Use to check Blood Sugars twice daily. Dx E11.8 1 kit 0  . Cholecalciferol (VITAMIN D) 2000 UNITS tablet Take 1 tablet (2,000 Units total) by mouth daily. (Patient taking differently: Take 2,000 Units by mouth every evening.) 30 tablet 11  . diclofenac sodium (VOLTAREN) 1 % GEL Apply 2-4 g topically See admin instructions. Apply 2-4 grams to each hand up to 4 times a day as needed for arthritis in the fingers 100 g 11  . Dulaglutide (TRULICITY) 3 UR/4.2HC SOPN Inject 3 mg into the skin once a week. 6 mL 3  . glucose blood (ACCU-CHEK AVIVA PLUS) test strip Use to check Blood Sugars twice daily. Dx E11.8 200 each 3  . insulin glargine (LANTUS SOLOSTAR) 100 UNIT/ML Solostar Pen Inject 110 Units into the skin daily after breakfast. 105 mL 3  . Insulin Pen Needle (UNIFINE PENTIPS) 31G X 8 MM MISC Use as directed daily Dx E 11.69 100 each 3  . levothyroxine (SYNTHROID) 112 MCG tablet Take 1 tablet (112 mcg total) by mouth every other day. Alternate with 125 45 tablet 3  . levothyroxine (SYNTHROID) 125 MCG tablet Take 1 tablet (125 mcg total) by mouth every other day. Alternate with 112 45 tablet 3  . metFORMIN (GLUCOPHAGE) 1000  MG tablet TAKE 1 TABLET BY MOUTH 2 TIMES DAILY WITH A MEAL. 180 tablet 1  . olmesartan (BENICAR) 20 MG tablet Take 1 tablet (20 mg total) by mouth daily. 90 tablet 1  . rosuvastatin (CRESTOR) 20 MG tablet Take 1 tablet (20 mg total) by mouth daily. For cholesterol 90 tablet 1   No current facility-administered medications on file prior  to visit.    ROS Review of Systems  Objective:  BP 135/65   Pulse 75   Temp 98 F (36.7 C)   Ht 4' 11"  (1.499 m)   Wt 174 lb 9.6 oz (79.2 kg)   LMP 03/02/2004   SpO2 98%   BMI 35.26 kg/m   BP Readings from Last 3 Encounters:  12/29/20 135/65  10/04/20 139/76  05/26/20 137/75    Wt Readings from Last 3 Encounters:  12/29/20 174 lb 9.6 oz (79.2 kg)  10/04/20 175 lb 8 oz (79.6 kg)  05/26/20 173 lb (78.5 kg)     Physical Exam  Diabetic Foot Exam - Simple   Simple Foot Form Diabetic Foot exam was performed with the following findings: Yes 12/29/2020  9:04 AM  Visual Inspection No deformities, no ulcerations, no other skin breakdown bilaterally: Yes Sensation Testing Intact to touch and monofilament testing bilaterally: Yes Pulse Check Posterior Tibialis and Dorsalis pulse intact bilaterally: Yes Comments       Assessment & Plan:   Zuria was seen today for medical management of chronic issues.  Diagnoses and all orders for this visit:  Controlled type 2 diabetes mellitus with complication, with long-term current use of insulin ([place]) -     Bayer DCA Hb A1c Waived -     CBC with Differential/Platelet -     CMP14+EGFR  Hypothyroidism, unspecified type -     Thyroid Panel With TSH  Mixed diabetic hyperlipidemia associated with type 2 diabetes mellitus ([place]) -     Lipid panel  Essential hypertension  Long term current use of insulin ([place])  Other specified hypothyroidism  Polyposis of colon -     Ambulatory referral to Gastroenterology   I am having Salomon Mast. Gibbs maintain her Vitamin D, acetaminophen, diclofenac sodium, Accu-Chek Aviva Plus, Accu-Chek Aviva, Accu-Chek Aviva Plus, B-D SINGLE USE SWABS REGULAR, Accu-Chek FastClix Lancets, Unifine Pentips, olmesartan, levothyroxine, levothyroxine, Lantus SoloStar, Trulicity, metFORMIN, and rosuvastatin.  No orders of the defined types were placed in this encounter.    Follow-up: Return in about 3  months (around 03/31/2021).  Claretta Fraise, M.D.

## 2020-12-29 NOTE — Patient Instructions (Signed)
https://www.diabeteseducator.org/docs/default-source/living-with-diabetes/conquering-the-grocery-store-v1.pdf?sfvrsn=4">  Carbohydrate Counting for Diabetes Mellitus, Adult Carbohydrate counting is a method of keeping track of how many carbohydrates you eat. Eating carbohydrates naturally increases the amount of sugar (glucose) in the blood. Counting how many carbohydrates you eat improves your blood glucose control, which helps you manage your diabetes. It is important to know how many carbohydrates you can safely have in each meal. This is different for every person. A dietitian can help you make a meal plan and calculate how many carbohydrates you should have at each meal and snack. What foods contain carbohydrates? Carbohydrates are found in the following foods:  Grains, such as breads and cereals.  Dried beans and soy products.  Starchy vegetables, such as potatoes, peas, and corn.  Fruit and fruit juices.  Milk and yogurt.  Sweets and snack foods, such as cake, cookies, candy, chips, and soft drinks.   How do I count carbohydrates in foods? There are two ways to count carbohydrates in food. You can read food labels or learn standard serving sizes of foods. You can use either of the methods or a combination of both. Using the Nutrition Facts label The Nutrition Facts list is included on the labels of almost all packaged foods and beverages in the [place] It includes:  The serving size.  Information about nutrients in each serving, including the grams (g) of carbohydrate per serving. To use the Nutrition Facts:  Decide how many servings you will have.  Multiply the number of servings by the number of carbohydrates per serving.  The resulting number is the total amount of carbohydrates that you will be having. Learning the standard serving sizes of foods When you eat carbohydrate foods that are not packaged or do not include Nutrition Facts on the label, you need to measure the  servings in order to count the amount of carbohydrates.  Measure the foods that you will eat with a food scale or measuring cup, if needed.  Decide how many standard-size servings you will eat.  Multiply the number of servings by 15. For foods that contain carbohydrates, one serving equals 15 g of carbohydrates. ? For example, if you eat 2 cups or 10 oz (300 g) of strawberries, you will have eaten 2 servings and 30 g of carbohydrates (2 servings x 15 g = 30 g).  For foods that have more than one food mixed, such as soups and casseroles, you must count the carbohydrates in each food that is included. The following list contains standard serving sizes of common carbohydrate-rich foods. Each of these servings has about 15 g of carbohydrates:  1 slice of bread.  1 six-inch (15 cm) tortilla.  ? cup or 2 oz (53 g) cooked rice or pasta.   cup or 3 oz (85 g) cooked or canned, drained and rinsed beans or lentils.   cup or 3 oz (85 g) starchy vegetable, such as peas, corn, or squash.   cup or 4 oz (120 g) hot cereal.   cup or 3 oz (85 g) boiled or mashed potatoes, or  or 3 oz (85 g) of a large baked potato.   cup or 4 fl oz (118 mL) fruit juice.  1 cup or 8 fl oz (237 mL) milk.  1 small or 4 oz (106 g) apple.   or 2 oz (63 g) of a medium banana.  1 cup or 5 oz (150 g) strawberries.  3 cups or 1 oz (24 g) popped popcorn. What is an example of   carbohydrate counting? To calculate the number of carbohydrates in this sample meal, follow the steps shown below. Sample meal  3 oz (85 g) chicken breast.  ? cup or 4 oz (106 g) brown rice.   cup or 3 oz (85 g) corn.  1 cup or 8 fl oz (237 mL) milk.  1 cup or 5 oz (150 g) strawberries with sugar-free whipped topping. Carbohydrate calculation 1. Identify the foods that contain carbohydrates: ? Rice. ? Corn. ? Milk. ? Strawberries. 2. Calculate how many servings you have of each food: ? 2 servings rice. ? 1 serving  corn. ? 1 serving milk. ? 1 serving strawberries. 3. Multiply each number of servings by 15 g: ? 2 servings rice x 15 g = 30 g. ? 1 serving corn x 15 g = 15 g. ? 1 serving milk x 15 g = 15 g. ? 1 serving strawberries x 15 g = 15 g. 4. Add together all of the amounts to find the total grams of carbohydrates eaten: ? 30 g + 15 g + 15 g + 15 g = 75 g of carbohydrates total. What are tips for following this plan? Shopping  Develop a meal plan and then make a shopping list.  Buy fresh and frozen vegetables, fresh and frozen fruit, dairy, eggs, beans, lentils, and whole grains.  Look at food labels. Choose foods that have more fiber and less sugar.  Avoid processed foods and foods with added sugars. Meal planning  Aim to have the same amount of carbohydrates at each meal and for each snack time.  Plan to have regular, balanced meals and snacks. Where to find more information  American Diabetes Association: www.diabetes.org  Centers for Disease Control and Prevention: http://www.wolf.info/ Summary  Carbohydrate counting is a method of keeping track of how many carbohydrates you eat.  Eating carbohydrates naturally increases the amount of sugar (glucose) in the blood.  Counting how many carbohydrates you eat improves your blood glucose control, which helps you manage your diabetes.  A dietitian can help you make a meal plan and calculate how many carbohydrates you should have at each meal and snack. This information is not intended to replace advice given to you by your health care provider. Make sure you discuss any questions you have with your health care provider. Document Revised: 08/06/2019 Document Reviewed: 08/07/2019 Elsevier Patient Education  2021 [place]. Diabetes Mellitus and [place] care is an important part of your health, especially when you have diabetes. Diabetes may cause you to have problems because of poor blood flow (circulation) to your feet and legs, which  can cause your skin to:  Become thinner and drier.  Break more easily.  Heal more slowly.  Peel and crack. You may also have nerve damage (neuropathy) in your legs and feet, causing decreased feeling in them. This means that you may not notice minor injuries to your feet that could lead to more serious problems. Noticing and addressing any potential problems early is the best way to prevent future foot problems. How to care for your feet Foot hygiene  Wash your feet daily with warm water and mild soap. Do not use hot water. Then, pat your feet and the areas between your toes until they are completely dry. Do not soak your feet as this can dry your skin.  Trim your toenails straight across. Do not dig under them or around the cuticle. File the edges of your nails with an emery board or  nail file.  Apply a moisturizing lotion or petroleum jelly to the skin on your feet and to dry, brittle toenails. Use lotion that does not contain alcohol and is unscented. Do not apply lotion between your toes.   Shoes and socks  Wear clean socks or stockings every day. Make sure they are not too tight. Do not wear knee-high stockings since they may decrease blood flow to your legs.  Wear shoes that fit properly and have enough cushioning. Always look in your shoes before you put them on to be sure there are no objects inside.  To break in new shoes, wear them for just a few hours a day. This prevents injuries on your feet. Wounds, scrapes, corns, and calluses  Check your feet daily for blisters, cuts, bruises, sores, and redness. If you cannot see the bottom of your feet, use a mirror or ask someone for help.  Do not cut corns or calluses or try to remove them with medicine.  If you find a minor scrape, cut, or break in the skin on your feet, keep it and the skin around it clean and dry. You may clean these areas with mild soap and water. Do not clean the area with peroxide, alcohol, or iodine.  If  you have a wound, scrape, corn, or callus on your foot, look at it several times a day to make sure it is healing and not infected. Check for: ? Redness, swelling, or pain. ? Fluid or blood. ? Warmth. ? Pus or a bad smell.   General tips  Do not cross your legs. This may decrease blood flow to your feet.  Do not use heating pads or hot water bottles on your feet. They may burn your skin. If you have lost feeling in your feet or legs, you may not know this is happening until it is too late.  Protect your feet from hot and cold by wearing shoes, such as at the beach or on hot pavement.  Schedule a complete foot exam at least once a year (annually) or more often if you have foot problems. Report any cuts, sores, or bruises to your health care provider immediately. Where to find more information  American Diabetes Association: www.diabetes.org  Association of Diabetes Care & Education Specialists: www.diabeteseducator.org Contact a health care provider if:  You have a medical condition that increases your risk of infection and you have any cuts, sores, or bruises on your feet.  You have an injury that is not healing.  You have redness on your legs or feet.  You feel burning or tingling in your legs or feet.  You have pain or cramps in your legs and feet.  Your legs or feet are numb.  Your feet always feel cold.  You have pain around any toenails. Get help right away if:  You have a wound, scrape, corn, or callus on your foot and: ? You have pain, swelling, or redness that gets worse. ? You have fluid or blood coming from the wound, scrape, corn, or callus. ? Your wound, scrape, corn, or callus feels warm to the touch. ? You have pus or a bad smell coming from the wound, scrape, corn, or callus. ? You have a fever. ? You have a red line going up your leg. Summary  Check your feet every day for blisters, cuts, bruises, sores, and redness.  Apply a moisturizing lotion or  petroleum jelly to the skin on your feet and to dry, brittle  toenails.  Wear shoes that fit properly and have enough cushioning.  If you have foot problems, report any cuts, sores, or bruises to your health care provider immediately.  Schedule a complete foot exam at least once a year (annually) or more often if you have foot problems. This information is not intended to replace advice given to you by your health care provider. Make sure you discuss any questions you have with your health care provider. Document Revised: 02/25/2020 Document Reviewed: 02/25/2020 Elsevier Patient Education  [place].

## 2020-12-30 LAB — LIPID PANEL
Chol/HDL Ratio: 2.3 ratio (ref 0.0–4.4)
Cholesterol, Total: 102 mg/dL (ref 100–199)
HDL: 45 mg/dL (ref >39)
LDL Chol Calc (NIH): 35 mg/dL (ref 0–99)
Triglycerides: 120 mg/dL (ref 0–149)
VLDL Cholesterol Cal: 22 mg/dL (ref 5–40)

## 2020-12-30 LAB — CBC WITH DIFFERENTIAL/PLATELET
Basophils Absolute: 0.1 10*3/uL (ref 0.0–0.2)
Basos: 1 %
EOS (ABSOLUTE): 0.1 10*3/uL (ref 0.0–0.4)
Eos: 2 %
Hematocrit: 39.2 % (ref 34.0–46.6)
Hemoglobin: 13 g/dL (ref 11.1–15.9)
Immature Grans (Abs): 0 10*3/uL (ref 0.0–0.1)
Immature Granulocytes: 0 %
Lymphocytes Absolute: 1.5 10*3/uL (ref 0.7–3.1)
Lymphs: 22 %
MCH: 27.1 pg (ref 26.6–33.0)
MCHC: 33.2 g/dL (ref 31.5–35.7)
MCV: 82 fL (ref 79–97)
Monocytes Absolute: 0.3 10*3/uL (ref 0.1–0.9)
Monocytes: 5 %
Neutrophils Absolute: 4.7 10*3/uL (ref 1.4–7.0)
Neutrophils: 70 %
Platelets: 199 10*3/uL (ref 150–450)
RBC: 4.8 x10E6/uL (ref 3.77–5.28)
RDW: 13.3 % (ref 11.7–15.4)
WBC: 6.8 10*3/uL (ref 3.4–10.8)

## 2020-12-30 LAB — CMP14+EGFR
ALT: 13 IU/L (ref 0–32)
AST: 16 IU/L (ref 0–40)
Albumin/Globulin Ratio: 1.9 (ref 1.2–2.2)
Albumin: 4.2 g/dL (ref 3.8–4.8)
Alkaline Phosphatase: 70 IU/L (ref 44–121)
BUN/Creatinine Ratio: 14 (ref 12–28)
BUN: 8 mg/dL (ref 8–27)
Bilirubin Total: 0.6 mg/dL (ref 0.0–1.2)
CO2: 22 mmol/L (ref 20–29)
Calcium: 8.4 mg/dL — ABNORMAL LOW (ref 8.7–10.3)
Chloride: 101 mmol/L (ref 96–106)
Creatinine, Ser: 0.57 mg/dL (ref 0.57–1.00)
Globulin, Total: 2.2 g/dL (ref 1.5–4.5)
Glucose: 106 mg/dL — ABNORMAL HIGH (ref 65–99)
Potassium: 4.2 mmol/L (ref 3.5–5.2)
Sodium: 138 mmol/L (ref 134–144)
Total Protein: 6.4 g/dL (ref 6.0–8.5)
eGFR: 100 mL/min/{1.73_m2} (ref >59)

## 2020-12-30 LAB — THYROID PANEL WITH TSH
Free Thyroxine Index: 2.8 (ref 1.2–4.9)
T3 Uptake Ratio: 25 % (ref 24–39)
T4, Total: 11.2 ug/dL (ref 4.5–12.0)
TSH: 0.765 u[IU]/mL (ref 0.450–4.500)

## 2021-01-28 DIAGNOSIS — H2513 Age-related nuclear cataract, bilateral: Secondary | ICD-10-CM | POA: Diagnosis not present

## 2021-01-28 DIAGNOSIS — H40033 Anatomical narrow angle, bilateral: Secondary | ICD-10-CM | POA: Diagnosis not present

## 2021-03-04 ENCOUNTER — Other Ambulatory Visit: Payer: Self-pay | Admitting: Family Medicine

## 2021-03-31 ENCOUNTER — Other Ambulatory Visit: Payer: Self-pay | Admitting: Family Medicine

## 2021-03-31 DIAGNOSIS — E118 Type 2 diabetes mellitus with unspecified complications: Secondary | ICD-10-CM

## 2021-03-31 DIAGNOSIS — Z794 Long term (current) use of insulin: Secondary | ICD-10-CM

## 2021-04-03 ENCOUNTER — Ambulatory Visit: Payer: Medicare HMO | Admitting: Family Medicine

## 2021-04-13 ENCOUNTER — Ambulatory Visit: Payer: Medicare HMO | Admitting: Family Medicine

## 2021-05-10 ENCOUNTER — Encounter: Payer: Self-pay | Admitting: Family Medicine

## 2021-05-10 ENCOUNTER — Ambulatory Visit (INDEPENDENT_AMBULATORY_CARE_PROVIDER_SITE_OTHER): Payer: Medicare HMO | Admitting: Family Medicine

## 2021-05-10 ENCOUNTER — Other Ambulatory Visit: Payer: Self-pay

## 2021-05-10 VITALS — BP 132/67 | HR 74 | Temp 97.9°F | Ht 59.0 in | Wt 174.2 lb

## 2021-05-10 DIAGNOSIS — E039 Hypothyroidism, unspecified: Secondary | ICD-10-CM

## 2021-05-10 DIAGNOSIS — E782 Mixed hyperlipidemia: Secondary | ICD-10-CM

## 2021-05-10 DIAGNOSIS — E1169 Type 2 diabetes mellitus with other specified complication: Secondary | ICD-10-CM

## 2021-05-10 DIAGNOSIS — E118 Type 2 diabetes mellitus with unspecified complications: Secondary | ICD-10-CM

## 2021-05-10 DIAGNOSIS — E038 Other specified hypothyroidism: Secondary | ICD-10-CM

## 2021-05-10 DIAGNOSIS — Z794 Long term (current) use of insulin: Secondary | ICD-10-CM

## 2021-05-10 DIAGNOSIS — Z23 Encounter for immunization: Secondary | ICD-10-CM | POA: Diagnosis not present

## 2021-05-10 DIAGNOSIS — I10 Essential (primary) hypertension: Secondary | ICD-10-CM

## 2021-05-10 LAB — BAYER DCA HB A1C WAIVED: HB A1C (BAYER DCA - WAIVED): 7 % — ABNORMAL HIGH (ref 4.8–5.6)

## 2021-05-10 MED ORDER — ROSUVASTATIN CALCIUM 20 MG PO TABS: 20.0000 mg | ORAL_TABLET | Freq: Every day | ORAL | 3 refills | Status: DC

## 2021-05-10 MED ORDER — OLMESARTAN MEDOXOMIL 20 MG PO TABS: 20.0000 mg | ORAL_TABLET | Freq: Every day | ORAL | 3 refills | Status: DC

## 2021-05-10 MED ORDER — LANTUS SOLOSTAR 100 UNIT/ML ~~LOC~~ SOPN: 110.0000 [IU] | PEN_INJECTOR | Freq: Every day | SUBCUTANEOUS | 3 refills | Status: DC

## 2021-05-10 MED ORDER — METFORMIN HCL 1000 MG PO TABS: 1000.0000 mg | ORAL_TABLET | Freq: Two times a day (BID) | ORAL | 3 refills | Status: DC

## 2021-05-10 MED ORDER — LEVOTHYROXINE SODIUM 125 MCG PO TABS: 125.0000 ug | ORAL_TABLET | ORAL | 3 refills | Status: DC

## 2021-05-10 MED ORDER — TRULICITY 3 MG/0.5ML ~~LOC~~ SOAJ: 3.0000 mg | SUBCUTANEOUS | 3 refills | Status: DC

## 2021-05-10 MED ORDER — LEVOTHYROXINE SODIUM 112 MCG PO TABS: 112.0000 ug | ORAL_TABLET | ORAL | 3 refills | Status: DC

## 2021-05-10 NOTE — Progress Notes (Signed)
Subjective:  [name],  female    DOB: [birth year]  Age: 70 y.o.    CC: Medical Management of Chronic Issues   HPI Morgan Schmidt presents for  follow-up of hypertension. Patient has no history of headache chest pain or shortness of breath or recent cough. Patient also denies symptoms of TIA such as numbness weakness lateralizing. Patient denies side effects from medication. States taking it regularly.  Patient also  in for follow-up of elevated cholesterol. Doing well without complaints on current medication. Denies side effects  including myalgia and arthralgia and nausea. Also in today for liver function testing. Currently no chest pain, shortness of breath or other cardiovascular related symptoms noted.  Follow-up of diabetes. Patient does check blood sugar at home. Readings run between 100 and 110 Patient denies symptoms such as excessive hunger or urinary frequency, excessive hunger, nausea No significant hypoglycemic spells noted.Switched insulin to morning and eliminated low glucose reactions. Had been getting below 100 and making her weak and shaky.  Medications reviewed. Pt reports taking them regularly. Pt. denies complication/adverse reaction today.    History Morgan Schmidt has a past medical history of Arthritis, Cancer ([place]), Closed left hip fracture, initial encounter ([place]) (03/09/2018), Diabetes mellitus, Hip fracture ([place]), History of thyroid cancer (08/24/2011), and Thyroid disease.   She has a past surgical history that includes Thyroidectomy (2009); Cesarean section; Knee arthroscopy (Left, 10/22/2016); Knee arthroscopy with medial menisectomy (10/22/2016); Knee arthroscopy with subchondroplasty (Left, 10/22/2016); and Hip pinning, cannulated (Left, 03/10/2018).   Her family history includes Heart disease in her mother.She reports that she quit smoking about 20 years ago. Her smoking use included cigarettes. She has never used smokeless tobacco. She reports that she  does not drink alcohol and does not use drugs.  Current Outpatient Medications on File Prior to Visit  Medication Sig Dispense Refill   Accu-Chek FastClix Lancets MISC Use to check Blood Sugars twice daily. Dx E11.8 204 each 3   acetaminophen (TYLENOL) 325 MG tablet Take 325-650 mg by mouth every 6 (six) hours as needed (for pain or headaches).     Alcohol Swabs (B-D SINGLE USE SWABS REGULAR) PADS Use to check Blood Sugars twice daily. Dx E11.8 200 each 3   Blood Glucose Calibration (ACCU-CHEK AVIVA) SOLN Use to check Blood Sugars twice daily. Dx E11.8 1 each 3   Blood Glucose Monitoring Suppl (ACCU-CHEK AVIVA PLUS) w/Device KIT Use to check Blood Sugars twice daily. Dx E11.8 1 kit 0   Cholecalciferol (VITAMIN D) 2000 UNITS tablet Take 1 tablet (2,000 Units total) by mouth daily. (Patient taking differently: Take 2,000 Units by mouth every evening.) 30 tablet 11   diclofenac sodium (VOLTAREN) 1 % GEL Apply 2-4 g topically See admin instructions. Apply 2-4 grams to each hand up to 4 times a day as needed for arthritis in the fingers 100 g 11   glucose blood (ACCU-CHEK AVIVA PLUS) test strip TEST BLOOD SUGAR TWICE DAILY Dx E11.8 200 strip 3   Insulin Pen Needle (UNIFINE PENTIPS) 31G X 8 MM MISC Use as directed daily Dx E 11.69 100 each 3   No current facility-administered medications on file prior to visit.    ROS Review of Systems  Constitutional: Negative.   HENT: Negative.    Eyes:  Negative for visual disturbance.  Respiratory:  Negative for shortness of breath.   Cardiovascular:  Negative for chest pain.  Gastrointestinal:  Negative for abdominal pain.  Musculoskeletal:  Negative for arthralgias.  Objective:  BP 132/67   Pulse 74   Temp 97.9 F (36.6 C)   Ht _0  (1.499 m)   Wt 174 lb 3.2 oz (79 kg)   LMP 03/02/2004   SpO2 96%   BMI 35.18 kg/m   BP Readings from Last 3 Encounters:  05/10/21 132/67  12/29/20 135/65  10/04/20 139/76    Wt Readings from Last 3  Encounters:  05/10/21 174 lb 3.2 oz (79 kg)  12/29/20 174 lb 9.6 oz (79.2 kg)  10/04/20 175 lb 8 oz (79.6 kg)     Physical Exam Constitutional:      General: She is not in acute distress.    Appearance: She is well-developed.  HENT:     Head: Normocephalic and atraumatic.  Eyes:     Conjunctiva/sclera: Conjunctivae normal.     Pupils: Pupils are equal, round, and reactive to light.  Neck:     Thyroid: No thyromegaly.  Cardiovascular:     Rate and Rhythm: Normal rate and regular rhythm.     Heart sounds: Normal heart sounds. No murmur heard. Pulmonary:     Effort: Pulmonary effort is normal. No respiratory distress.     Breath sounds: Normal breath sounds. No wheezing or rales.  Abdominal:     General: Bowel sounds are normal. There is no distension.     Palpations: Abdomen is soft.     Tenderness: There is no abdominal tenderness.  Musculoskeletal:        General: Normal range of motion.     Cervical back: Normal range of motion and neck supple.  Lymphadenopathy:     Cervical: No cervical adenopathy.  Skin:    General: Skin is warm and dry.  Neurological:     Mental Status: She is alert and oriented to person, place, and time.  Psychiatric:        Behavior: Behavior normal.        Thought Content: Thought content normal.        Judgment: Judgment normal.    Diabetic Foot Exam - Simple   No data filed       Assessment & Plan:   Morgan Schmidt was seen today for medical management of chronic issues.  Diagnoses and all orders for this visit:  Controlled type 2 diabetes mellitus with complication, with long-term current use of insulin ([place]) -     Bayer DCA Hb A1c Waived -     CBC with Differential/Platelet -     olmesartan (BENICAR) 20 MG tablet; Take 1 tablet (20 mg total) by mouth daily. -     metFORMIN (GLUCOPHAGE) 1000 MG tablet; Take 1 tablet (1,000 mg total) by mouth 2 (two) times daily with a meal. -     insulin glargine (LANTUS SOLOSTAR) 100 UNIT/ML Solostar  Pen; Inject 110 Units into the skin daily after breakfast.  Hypothyroidism, unspecified type -     TSH + free T4  Mixed diabetic hyperlipidemia associated with type 2 diabetes mellitus (HCC) -     Lipid panel -     rosuvastatin (CRESTOR) 20 MG tablet; Take 1 tablet (20 mg total) by mouth daily. For cholesterol  Essential hypertension -     CMP14+EGFR -     olmesartan (BENICAR) 20 MG tablet; Take 1 tablet (20 mg total) by mouth daily.  Other specified hypothyroidism -     levothyroxine (SYNTHROID) 112 MCG tablet; Take 1 tablet (112 mcg total) by mouth every other day. Alternate with 125  Other orders -  levothyroxine (SYNTHROID) 125 MCG tablet; Take 1 tablet (125 mcg total) by mouth every other day. Alternate with 112 -     Dulaglutide (TRULICITY) 3 UX/3.2GM SOPN; Inject 3 mg into the skin once a week.  I have changed Salomon Mast. Doerner's metFORMIN. I am also having her maintain her Vitamin D, acetaminophen, diclofenac sodium, Accu-Chek Aviva Plus, Accu-Chek Aviva, B-D SINGLE USE SWABS REGULAR, Accu-Chek FastClix Lancets, Unifine Pentips, Accu-Chek Aviva Plus, rosuvastatin, olmesartan, levothyroxine, levothyroxine, Trulicity, and Lantus SoloStar.  Meds ordered this encounter  Medications   rosuvastatin (CRESTOR) 20 MG tablet    Sig: Take 1 tablet (20 mg total) by mouth daily. For cholesterol    Dispense:  90 tablet    Refill:  3   olmesartan (BENICAR) 20 MG tablet    Sig: Take 1 tablet (20 mg total) by mouth daily.    Dispense:  90 tablet    Refill:  3   metFORMIN (GLUCOPHAGE) 1000 MG tablet    Sig: Take 1 tablet (1,000 mg total) by mouth 2 (two) times daily with a meal.    Dispense:  180 tablet    Refill:  3   levothyroxine (SYNTHROID) 125 MCG tablet    Sig: Take 1 tablet (125 mcg total) by mouth every other day. Alternate with 112    Dispense:  45 tablet    Refill:  3   levothyroxine (SYNTHROID) 112 MCG tablet    Sig: Take 1 tablet (112 mcg total) by mouth every other day.  Alternate with 125    Dispense:  45 tablet    Refill:  3   Dulaglutide (TRULICITY) 3 WN/0.2VO SOPN    Sig: Inject 3 mg into the skin once a week.    Dispense:  6 mL    Refill:  3   insulin glargine (LANTUS SOLOSTAR) 100 UNIT/ML Solostar Pen    Sig: Inject 110 Units into the skin daily after breakfast.    Dispense:  105 mL    Refill:  3     Follow-up: Return in about 3 months (around 08/09/2021).  Claretta Fraise, M.D.

## 2021-05-11 LAB — CBC WITH DIFFERENTIAL/PLATELET
Basophils Absolute: 0.1 10*3/uL (ref 0.0–0.2)
Basos: 1 %
EOS (ABSOLUTE): 0.1 10*3/uL (ref 0.0–0.4)
Eos: 2 %
Hematocrit: 38.5 % (ref 34.0–46.6)
Hemoglobin: 13 g/dL (ref 11.1–15.9)
Immature Grans (Abs): 0 10*3/uL (ref 0.0–0.1)
Immature Granulocytes: 1 %
Lymphocytes Absolute: 1.6 10*3/uL (ref 0.7–3.1)
Lymphs: 25 %
MCH: 27.9 pg (ref 26.6–33.0)
MCHC: 33.8 g/dL (ref 31.5–35.7)
MCV: 83 fL (ref 79–97)
Monocytes Absolute: 0.4 10*3/uL (ref 0.1–0.9)
Monocytes: 6 %
Neutrophils Absolute: 4.2 10*3/uL (ref 1.4–7.0)
Neutrophils: 65 %
Platelets: 206 10*3/uL (ref 150–450)
RBC: 4.66 x10E6/uL (ref 3.77–5.28)
RDW: 14.7 % (ref 11.7–15.4)
WBC: 6.4 10*3/uL (ref 3.4–10.8)

## 2021-05-11 LAB — TSH+FREE T4
Free T4: 1.97 ng/dL — ABNORMAL HIGH (ref 0.82–1.77)
TSH: 0.303 u[IU]/mL — ABNORMAL LOW (ref 0.450–4.500)

## 2021-05-11 LAB — CMP14+EGFR
ALT: 13 IU/L (ref 0–32)
AST: 17 IU/L (ref 0–40)
Albumin/Globulin Ratio: 2.6 — ABNORMAL HIGH (ref 1.2–2.2)
Albumin: 4.6 g/dL (ref 3.8–4.8)
Alkaline Phosphatase: 69 IU/L (ref 44–121)
BUN/Creatinine Ratio: 15 (ref 12–28)
BUN: 9 mg/dL (ref 8–27)
Bilirubin Total: 0.9 mg/dL (ref 0.0–1.2)
CO2: 22 mmol/L (ref 20–29)
Calcium: 8.1 mg/dL — ABNORMAL LOW (ref 8.7–10.3)
Chloride: 104 mmol/L (ref 96–106)
Creatinine, Ser: 0.61 mg/dL (ref 0.57–1.00)
Globulin, Total: 1.8 g/dL (ref 1.5–4.5)
Glucose: 96 mg/dL (ref 65–99)
Potassium: 4.1 mmol/L (ref 3.5–5.2)
Sodium: 144 mmol/L (ref 134–144)
Total Protein: 6.4 g/dL (ref 6.0–8.5)
eGFR: 97 mL/min/{1.73_m2} (ref >59)

## 2021-05-11 LAB — LIPID PANEL
Chol/HDL Ratio: 2.5 ratio (ref 0.0–4.4)
Cholesterol, Total: 96 mg/dL — ABNORMAL LOW (ref 100–199)
HDL: 39 mg/dL — ABNORMAL LOW (ref >39)
LDL Chol Calc (NIH): 35 mg/dL (ref 0–99)
Triglycerides: 124 mg/dL (ref 0–149)
VLDL Cholesterol Cal: 22 mg/dL (ref 5–40)

## 2021-05-12 NOTE — Progress Notes (Signed)
Hello Morgan Schmidt,  Your lab result is normal and/or stable.Some minor variations that are not significant are commonly marked abnormal, but do not represent any medical problem for you.  Best regards, Hersh Minney, M.D.

## 2021-06-24 ENCOUNTER — Other Ambulatory Visit: Payer: Self-pay | Admitting: Family Medicine

## 2021-06-24 DIAGNOSIS — E118 Type 2 diabetes mellitus with unspecified complications: Secondary | ICD-10-CM

## 2021-08-10 ENCOUNTER — Ambulatory Visit: Payer: Medicare HMO | Admitting: Family Medicine

## 2021-08-17 ENCOUNTER — Ambulatory Visit: Payer: Medicare HMO | Admitting: Family Medicine

## 2021-09-07 ENCOUNTER — Encounter: Payer: Self-pay | Admitting: Family Medicine

## 2021-09-07 ENCOUNTER — Ambulatory Visit (INDEPENDENT_AMBULATORY_CARE_PROVIDER_SITE_OTHER): Payer: Medicare HMO | Admitting: Family Medicine

## 2021-09-07 VITALS — BP 126/66 | HR 74 | Temp 97.7°F | Ht 59.0 in | Wt 170.6 lb

## 2021-09-07 DIAGNOSIS — E118 Type 2 diabetes mellitus with unspecified complications: Secondary | ICD-10-CM

## 2021-09-07 DIAGNOSIS — E039 Hypothyroidism, unspecified: Secondary | ICD-10-CM

## 2021-09-07 DIAGNOSIS — I10 Essential (primary) hypertension: Secondary | ICD-10-CM | POA: Diagnosis not present

## 2021-09-07 DIAGNOSIS — Z794 Long term (current) use of insulin: Secondary | ICD-10-CM | POA: Diagnosis not present

## 2021-09-07 DIAGNOSIS — E782 Mixed hyperlipidemia: Secondary | ICD-10-CM | POA: Diagnosis not present

## 2021-09-07 DIAGNOSIS — R739 Hyperglycemia, unspecified: Secondary | ICD-10-CM | POA: Diagnosis not present

## 2021-09-07 DIAGNOSIS — K635 Polyp of colon: Secondary | ICD-10-CM | POA: Diagnosis not present

## 2021-09-07 DIAGNOSIS — Z23 Encounter for immunization: Secondary | ICD-10-CM | POA: Diagnosis not present

## 2021-09-07 DIAGNOSIS — E1169 Type 2 diabetes mellitus with other specified complication: Secondary | ICD-10-CM | POA: Diagnosis not present

## 2021-09-07 LAB — BAYER DCA HB A1C WAIVED: HB A1C (BAYER DCA - WAIVED): 7.6 % — ABNORMAL HIGH (ref 4.8–5.6)

## 2021-09-07 MED ORDER — DOCUSATE SODIUM 100 MG PO CAPS: 100.0000 mg | ORAL_CAPSULE | Freq: Two times a day (BID) | ORAL | 3 refills | Status: DC

## 2021-09-07 MED ORDER — TRULICITY 4.5 MG/0.5ML ~~LOC~~ SOAJ: 4.5000 mg | SUBCUTANEOUS | 3 refills | Status: DC

## 2021-09-07 NOTE — Progress Notes (Signed)
Subjective:  [name],  female    DOB: [birth year]  Age: 71 y.o.    CC: Medical Management of Chronic Issues   HPI Morgan Schmidt presents for  follow-up of hypertension. Patient has no history of headache chest pain or shortness of breath or recent cough. Patient also denies symptoms of TIA such as numbness weakness lateralizing. Patient denies side effects from medication. States taking it regularly.  Patient also  in for follow-up of elevated cholesterol. Doing well without complaints on current medication. Denies side effects  including myalgia and arthralgia and nausea. Also in today for liver function testing. Currently no chest pain, shortness of breath or other cardiovascular related symptoms noted.  Follow-up of diabetes. Patient does check blood sugar at home. Readings run between 110 fasting and 200 prandial Patient denies symptoms such as excessive hunger or urinary frequency, excessive hunger, nausea No significant hypoglycemic spells noted. Medications reviewed. Pt reports taking them regularly. Pt. denies complication/adverse reaction today.    History Morgan Schmidt has a past medical history of Arthritis, Cancer ([place]), Closed left hip fracture, initial encounter ([place]) (03/09/2018), Diabetes mellitus, Hip fracture ([place]), History of thyroid cancer (08/24/2011), and Thyroid disease.   She has a past surgical history that includes Thyroidectomy (2009); Cesarean section; Knee arthroscopy (Left, 10/22/2016); Knee arthroscopy with medial menisectomy (10/22/2016); Knee arthroscopy with subchondroplasty (Left, 10/22/2016); and Hip pinning, cannulated (Left, 03/10/2018).   Her family history includes Heart disease in her mother.She reports that she quit smoking about 21 years ago. Her smoking use included cigarettes. She has never used smokeless tobacco. She reports that she does not drink alcohol and does not use drugs.  Current Outpatient Medications on File Prior to Visit   Medication Sig Dispense Refill   Accu-Chek FastClix Lancets MISC Use to check Blood Sugars twice daily. Dx E11.8 204 each 3   acetaminophen (TYLENOL) 325 MG tablet Take 325-650 mg by mouth every 6 (six) hours as needed (for pain or headaches).     Alcohol Swabs (B-D SINGLE USE SWABS REGULAR) PADS Use to check Blood Sugars twice daily. Dx E11.8 200 each 3   Blood Glucose Calibration (ACCU-CHEK AVIVA) SOLN Use to check Blood Sugars twice daily. Dx E11.8 1 each 3   Blood Glucose Monitoring Suppl (ACCU-CHEK AVIVA PLUS) w/Device KIT Use to check Blood Sugars twice daily. Dx E11.8 1 kit 0   Cholecalciferol (VITAMIN D) 2000 UNITS tablet Take 1 tablet (2,000 Units total) by mouth daily. (Patient taking differently: Take 2,000 Units by mouth every evening.) 30 tablet 11   diclofenac sodium (VOLTAREN) 1 % GEL Apply 2-4 g topically See admin instructions. Apply 2-4 grams to each hand up to 4 times a day as needed for arthritis in the fingers 100 g 11   glucose blood (ACCU-CHEK AVIVA PLUS) test strip TEST BLOOD SUGAR TWICE DAILY Dx E11.8 200 strip 3   insulin glargine (LANTUS SOLOSTAR) 100 UNIT/ML Solostar Pen Inject 110 Units into the skin daily after breakfast. 105 mL 3   Insulin Pen Needle (DROPLET PEN NEEDLES) 31G X 8 MM MISC Use as direceted daily Dx E11.8 100 each 3   levothyroxine (SYNTHROID) 112 MCG tablet Take 1 tablet (112 mcg total) by mouth every other day. Alternate with 125 45 tablet 3   levothyroxine (SYNTHROID) 125 MCG tablet Take 1 tablet (125 mcg total) by mouth every other day. Alternate with 112 45 tablet 3   metFORMIN (GLUCOPHAGE) 1000 MG tablet Take 1 tablet (1,000 mg total)  by mouth 2 (two) times daily with a meal. 180 tablet 3   olmesartan (BENICAR) 20 MG tablet Take 1 tablet (20 mg total) by mouth daily. 90 tablet 3   rosuvastatin (CRESTOR) 20 MG tablet Take 1 tablet (20 mg total) by mouth daily. For cholesterol 90 tablet 3   No current facility-administered medications on file prior  to visit.    ROS Review of Systems  Constitutional: Negative.   HENT: Negative.    Eyes:  Negative for visual disturbance.  Respiratory:  Negative for shortness of breath.   Cardiovascular:  Negative for chest pain.  Gastrointestinal:  Negative for abdominal pain.  Musculoskeletal:  Negative for arthralgias.   Objective:  BP 126/66    Pulse 74    Temp 97.7 F (36.5 C)    Ht _0  (1.499 m)    Wt 170 lb 9.6 oz (77.4 kg)    LMP 03/02/2004    SpO2 94%    BMI 34.46 kg/m   BP Readings from Last 3 Encounters:  09/07/21 126/66  05/10/21 132/67  12/29/20 135/65    Wt Readings from Last 3 Encounters:  09/07/21 170 lb 9.6 oz (77.4 kg)  05/10/21 174 lb 3.2 oz (79 kg)  12/29/20 174 lb 9.6 oz (79.2 kg)     Physical Exam Constitutional:      General: She is not in acute distress.    Appearance: She is well-developed.  Cardiovascular:     Rate and Rhythm: Normal rate and regular rhythm.  Pulmonary:     Breath sounds: Normal breath sounds.  Musculoskeletal:        General: Normal range of motion.  Skin:    General: Skin is warm and dry.  Neurological:     Mental Status: She is alert and oriented to person, place, and time.    Diabetic Foot Exam - Simple   No data filed       Assessment & Plan:   Morgan Schmidt was seen today for medical management of chronic issues.  Diagnoses and all orders for this visit:  Controlled type 2 diabetes mellitus with complication, with long-term current use of insulin ([place]) -     Bayer DCA Hb A1c Waived  Hypothyroidism, unspecified type -     TSH + free T4  Essential hypertension -     CBC with Differential/Platelet -     CMP14+EGFR  Mixed diabetic hyperlipidemia associated with type 2 diabetes mellitus ([place]) -     Lipid panel  Polyposis of colon -     Ambulatory referral to Gastroenterology  Other orders -     Dulaglutide (TRULICITY) 4.5 WK/0.8UP SOPN; Inject 4.5 mg as directed once a week. -     docusate sodium (COLACE) 100 MG  capsule; Take 1 capsule (100 mg total) by mouth 2 (two) times daily.   I have discontinued Morgan Schmidt. Morgan Schmidt Trulicity. I am also having her start on Trulicity and docusate sodium. Additionally, I am having her maintain her Vitamin D, acetaminophen, diclofenac sodium, Accu-Chek Aviva Plus, Accu-Chek Aviva, B-D SINGLE USE SWABS REGULAR, Accu-Chek FastClix Lancets, Accu-Chek Aviva Plus, rosuvastatin, olmesartan, metFORMIN, levothyroxine, levothyroxine, Lantus SoloStar, and Droplet Pen Needles.  Meds ordered this encounter  Medications   Dulaglutide (TRULICITY) 4.5 JS/3.1RX SOPN    Sig: Inject 4.5 mg as directed once a week.    Dispense:  6.5 mL    Refill:  3   docusate sodium (COLACE) 100 MG capsule    Sig: Take 1 capsule (100  mg total) by mouth 2 (two) times daily.    Dispense:  180 capsule    Refill:  3     Follow-up: Return in about 3 months (around 12/06/2021).  Claretta Fraise, M.D.

## 2021-09-07 NOTE — Addendum Note (Signed)
Addended by: Baldomero Lamy B on: 09/07/2021 11:24 AM   Modules accepted: Orders

## 2021-09-08 LAB — CBC WITH DIFFERENTIAL/PLATELET
Basophils Absolute: 0.1 10*3/uL (ref 0.0–0.2)
Basos: 1 %
EOS (ABSOLUTE): 0.1 10*3/uL (ref 0.0–0.4)
Eos: 2 %
Hematocrit: 41 % (ref 34.0–46.6)
Hemoglobin: 13.4 g/dL (ref 11.1–15.9)
Immature Grans (Abs): 0 10*3/uL (ref 0.0–0.1)
Immature Granulocytes: 1 %
Lymphocytes Absolute: 1.7 10*3/uL (ref 0.7–3.1)
Lymphs: 27 %
MCH: 26.5 pg — ABNORMAL LOW (ref 26.6–33.0)
MCHC: 32.7 g/dL (ref 31.5–35.7)
MCV: 81 fL (ref 79–97)
Monocytes Absolute: 0.4 10*3/uL (ref 0.1–0.9)
Monocytes: 6 %
Neutrophils Absolute: 4.1 10*3/uL (ref 1.4–7.0)
Neutrophils: 63 %
Platelets: 216 10*3/uL (ref 150–450)
RBC: 5.06 x10E6/uL (ref 3.77–5.28)
RDW: 13.5 % (ref 11.7–15.4)
WBC: 6.4 10*3/uL (ref 3.4–10.8)

## 2021-09-08 LAB — CMP14+EGFR
ALT: 13 IU/L (ref 0–32)
AST: 13 IU/L (ref 0–40)
Albumin/Globulin Ratio: 2.3 — ABNORMAL HIGH (ref 1.2–2.2)
Albumin: 4.4 g/dL (ref 3.8–4.8)
Alkaline Phosphatase: 73 IU/L (ref 44–121)
BUN/Creatinine Ratio: 13 (ref 12–28)
BUN: 9 mg/dL (ref 8–27)
Bilirubin Total: 0.6 mg/dL (ref 0.0–1.2)
CO2: 22 mmol/L (ref 20–29)
Calcium: 8.3 mg/dL — ABNORMAL LOW (ref 8.7–10.3)
Chloride: 100 mmol/L (ref 96–106)
Creatinine, Ser: 0.68 mg/dL (ref 0.57–1.00)
Globulin, Total: 1.9 g/dL (ref 1.5–4.5)
Glucose: 82 mg/dL (ref 70–99)
Potassium: 3.8 mmol/L (ref 3.5–5.2)
Sodium: 140 mmol/L (ref 134–144)
Total Protein: 6.3 g/dL (ref 6.0–8.5)
eGFR: 95 mL/min/{1.73_m2} (ref >59)

## 2021-09-08 LAB — TSH+FREE T4
Free T4: 1.8 ng/dL — ABNORMAL HIGH (ref 0.82–1.77)
TSH: 0.514 u[IU]/mL (ref 0.450–4.500)

## 2021-09-08 LAB — LIPID PANEL
Chol/HDL Ratio: 2.9 ratio (ref 0.0–4.4)
Cholesterol, Total: 126 mg/dL (ref 100–199)
HDL: 43 mg/dL (ref >39)
LDL Chol Calc (NIH): 58 mg/dL (ref 0–99)
Triglycerides: 145 mg/dL (ref 0–149)
VLDL Cholesterol Cal: 25 mg/dL (ref 5–40)

## 2021-09-25 ENCOUNTER — Telehealth: Payer: Self-pay | Admitting: Family Medicine

## 2021-09-26 ENCOUNTER — Encounter: Payer: Self-pay | Admitting: Internal Medicine

## 2021-09-27 ENCOUNTER — Other Ambulatory Visit: Payer: Self-pay | Admitting: Family Medicine

## 2021-09-27 DIAGNOSIS — E782 Mixed hyperlipidemia: Secondary | ICD-10-CM

## 2021-09-27 DIAGNOSIS — E1169 Type 2 diabetes mellitus with other specified complication: Secondary | ICD-10-CM

## 2021-09-29 ENCOUNTER — Ambulatory Visit (INDEPENDENT_AMBULATORY_CARE_PROVIDER_SITE_OTHER): Payer: Medicare HMO

## 2021-09-29 VITALS — Wt 170.0 lb

## 2021-09-29 DIAGNOSIS — Z0000 Encounter for general adult medical examination without abnormal findings: Secondary | ICD-10-CM

## 2021-09-29 NOTE — Progress Notes (Signed)
Subjective:   Morgan Schmidt is a 71 y.o. female who presents for Medicare Annual (Subsequent) preventive examination.  Virtual Visit via Telephone Note  I connected with  Morgan Schmidt on 09/29/21 at  2:00 PM EST by telephone and verified that I am speaking with the correct person using two identifiers.  Location: Patient: Home Provider: [facility] Persons participating in the virtual visit: patient/Nurse Health Advisor   I discussed the limitations, risks, security and privacy concerns of performing an evaluation and management service by telephone and the availability of in person appointments. The patient expressed understanding and agreed to proceed.  Interactive audio and video telecommunications were attempted between this nurse and patient, however failed, due to patient having technical difficulties OR patient did not have access to video capability.  We continued and completed visit with audio only.  Some vital signs may be absent or patient reported.   Aury Scollard E Lyndsy Gilberto, LPN   Review of Systems     Cardiac Risk Factors include: advanced age (>94mn, >>46women);diabetes mellitus;dyslipidemia;hypertension;sedentary lifestyle;obesity (BMI >30kg/m2)     Objective:    Today's Vitals   09/29/21 1409  Weight: 170 lb (77.1 kg)   Body mass index is 34.34 kg/m.  Advanced Directives 09/29/2021 09/28/2020 09/25/2019 03/09/2018 11/07/2016 10/22/2016 10/18/2016  Does Patient Have a Medical Advance Directive? No No No No No No No  Would patient like information on creating a medical advance directive? No - Patient declined No - Patient declined No - Patient declined No - Patient declined - Yes (MAU/Ambulatory/Procedural Areas - Information given) No - Patient declined    Current Medications (verified) Outpatient Encounter Medications as of 09/29/2021  Medication Sig   Accu-Chek FastClix Lancets MISC Use to check Blood Sugars twice daily. Dx E11.8   acetaminophen (TYLENOL) 325 MG tablet Take  325-650 mg by mouth every 6 (six) hours as needed (for pain or headaches).   Alcohol Swabs (B-D SINGLE USE SWABS REGULAR) PADS Use to check Blood Sugars twice daily. Dx E11.8   Blood Glucose Calibration (ACCU-CHEK AVIVA) SOLN Use to check Blood Sugars twice daily. Dx E11.8   Blood Glucose Monitoring Suppl (ACCU-CHEK AVIVA PLUS) w/Device KIT Use to check Blood Sugars twice daily. Dx E11.8   Cholecalciferol (VITAMIN D) 2000 UNITS tablet Take 1 tablet (2,000 Units total) by mouth daily. (Patient taking differently: Take 2,000 Units by mouth every evening.)   diclofenac sodium (VOLTAREN) 1 % GEL Apply 2-4 g topically See admin instructions. Apply 2-4 grams to each hand up to 4 times a day as needed for arthritis in the fingers   docusate sodium (COLACE) 100 MG capsule Take 1 capsule (100 mg total) by mouth 2 (two) times daily.   Dulaglutide (TRULICITY) 4.5 MBJ/4.7WGSOPN Inject 4.5 mg as directed once a week.   glucose blood (ACCU-CHEK AVIVA PLUS) test strip TEST BLOOD SUGAR TWICE DAILY Dx E11.8   insulin glargine (LANTUS SOLOSTAR) 100 UNIT/ML Solostar Pen Inject 110 Units into the skin daily after breakfast.   Insulin Pen Needle (DROPLET PEN NEEDLES) 31G X 8 MM MISC Use as direceted daily Dx E11.8   levothyroxine (SYNTHROID) 112 MCG tablet Take 1 tablet (112 mcg total) by mouth every other day. Alternate with 125   levothyroxine (SYNTHROID) 125 MCG tablet Take 1 tablet (125 mcg total) by mouth every other day. Alternate with 112   metFORMIN (GLUCOPHAGE) 1000 MG tablet Take 1 tablet (1,000 mg total) by mouth 2 (two) times daily with a meal.   olmesartan (  BENICAR) 20 MG tablet Take 1 tablet (20 mg total) by mouth daily.   rosuvastatin (CRESTOR) 20 MG tablet TAKE 1 TABLET EVERY DAY FOR CHOLESTEROL   No facility-administered encounter medications on file as of 09/29/2021.    Allergies (verified) Lipitor [atorvastatin calcium] and Ace inhibitors   History: Past Medical History:  Diagnosis Date    Arthritis    Cancer ([place])    thyroid CA   Closed left hip fracture, initial encounter ([place]) 03/09/2018   Diabetes mellitus    type 2   Hip fracture ([place])    History of thyroid cancer 08/24/2011   Thyroid disease    Past Surgical History:  Procedure Laterality Date   CESAREAN SECTION     FRACTURE SURGERY  2020   HIP PINNING,CANNULATED Left 03/10/2018   Procedure: CANNULATED HIP PINNING;  Surgeon: Paralee Cancel, MD;  Location: [facility];  Service: Orthopedics;  Laterality: Left;   KNEE ARTHROSCOPY Left 10/22/2016   Procedure: ARTHROSCOPY LEFT KNEE WITH DEBRIDEMENT;  Surgeon: Paralee Cancel, MD;  Location: [facility];  Service: Orthopedics;  Laterality: Left;   KNEE ARTHROSCOPY WITH MEDIAL MENISECTOMY  10/22/2016   Procedure: KNEE ARTHROSCOPY WITH MEDIAL MENISECTOMY;  Surgeon: Paralee Cancel, MD;  Location[facility];  Service: Orthopedics;;   KNEE ARTHROSCOPY WITH SUBCHONDROPLASTY Left 10/22/2016   Procedure: KNEE ARTHROSCOPY WITH MEDIAL CHONDROPLASTY;  Surgeon: Paralee Cancel, MD;  Location: [facility];  Service: Orthopedics;  Laterality: Left;   THYROIDECTOMY  08/21/2007   TUBAL LIGATION  1985   Family History  Problem Relation Age of Onset   Heart disease Mother    Arthritis Father    Colon cancer Neg Hx    Esophageal cancer Neg Hx    Rectal cancer Neg Hx    Stomach cancer Neg Hx    Breast cancer Neg Hx    Social History   Socioeconomic History   Marital status: Significant Other    Spouse name: Not on file   Number of children: 3   Years of education: 12   Highest education level: High school graduate  Occupational History   Occupation: retired  Tobacco Use   Smoking status: Former    Packs/day: 0.50    Years: 5.00    Pack years: 2.50    Types: Cigarettes    Quit date: 08/23/2000    Years since quitting: 21.1   Smokeless tobacco: Never  Vaping Use   Vaping Use: Never used  Substance and Sexual Activity   Alcohol use: No    Drug use: No   Sexual activity: Not Currently  Other Topics Concern   Not on file  Social History Narrative   Not on file   Social Determinants of Health   Financial Resource Strain: Low Risk    Difficulty of Paying Living Expenses: Not hard at all  Food Insecurity: No Food Insecurity   Worried About Charity fundraiser in the Last Year: Never true   [place] in the Last Year: Never true  Transportation Needs: No Transportation Needs   Lack of Transportation (Medical): No   Lack of Transportation (Non-Medical): No  Physical Activity: Insufficiently Active   Days of Exercise per Week: 5 days   Minutes of Exercise per Session: 20 min  Stress: No Stress Concern Present   Feeling of Stress : Not at all  Social Connections: Socially Integrated   Frequency of Communication with Friends and Family: More than three times a week  Frequency of Social Gatherings with Friends and Family: More than three times a week   Attends Religious Services: More than 4 times per year   Active Member of Clubs or Organizations: Yes   Attends Music therapist: More than 4 times per year   Marital Status: Living with partner    Tobacco Counseling Counseling given: Not Answered   Clinical Intake:  Pre-visit preparation completed: Yes  Pain : No/denies pain     BMI - recorded: 34.34 Nutritional Status: BMI > 30  Obese Nutritional Risks: None Diabetes: Yes CBG done?: No Did pt. bring in CBG monitor from home?: No  How often do you need to have someone help you when you read instructions, pamphlets, or other written materials from your doctor or pharmacy?: 1 - Never  Diabetic? Nutrition Risk Assessment:  Has the patient had any N/V/D within the last 2 months?  No  Does the patient have any non-healing wounds?  No  Has the patient had any unintentional weight loss or weight gain?  No   Diabetes:  Is the patient diabetic?  Yes  If diabetic, was a CBG obtained today?   No  Did the patient bring in their glucometer from home?  No  How often do you monitor your CBG's? Once or twice per day.   Financial Strains and Diabetes Management:  Are you having any financial strains with the device, your supplies or your medication? No .  Does the patient want to be seen by Chronic Care Management for management of their diabetes?  No  Would the patient like to be referred to a Nutritionist or for Diabetic Management?  No   Diabetic Exams:  Diabetic Eye Exam: Completed 06/2021.   Diabetic Foot Exam: Completed 12/29/2020. Pt has been advised about the importance in completing this exam. Pt is scheduled for diabetic foot exam on next appt 11/2021.    Interpreter Needed?: No  Information entered by :: Jahleel Stroschein, LPN   Activities of Daily Living In your present state of health, do you have any difficulty performing the following activities: 09/29/2021 09/25/2021  Hearing? N N  Vision? N N  Difficulty concentrating or making decisions? N N  Walking or climbing stairs? N N  Dressing or bathing? N N  Doing errands, shopping? N N  Preparing Food and eating ? N N  Using the Toilet? N N  In the past six months, have you accidently leaked urine? N N  Do you have problems with loss of bowel control? N N  Managing your Medications? N N  Managing your Finances? N N  Housekeeping or managing your Housekeeping? N N  Some recent data might be hidden    Patient Care Team: Claretta Fraise, MD as PCP - General (Family Medicine)  Indicate any recent Medical Services you may have received from other than [facility] providers in the past year (date may be approximate).     Assessment:   This is a routine wellness examination for Morgan Schmidt.  Hearing/Vision screen Hearing Screening - Comments:: C/O moderate hearing difficulties Vision Screening - Comments:: Wears rx glasses - up to date with routine eye exams with [facility] in [place] issues and exercise  activities discussed: Current Exercise Habits: Home exercise routine, Type of exercise: walking;stretching, Time (Minutes): 20, Frequency (Times/Week): 5, Weekly Exercise (Minutes/Week): 100, Intensity: Mild, Exercise limited by: None identified   Goals Addressed             This  Visit's Progress    DIET - INCREASE WATER INTAKE   On track    Exercise 150 min/wk Moderate Activity   On track      Depression Screen PHQ 2/9 Scores 09/29/2021 09/07/2021 05/10/2021 12/29/2020 10/04/2020 09/28/2020 05/26/2020  PHQ - 2 Score 0 0 0 0 0 0 0    Fall Risk Fall Risk  09/29/2021 09/25/2021 09/07/2021 05/10/2021 12/29/2020  Falls in the past year? 0 0 0 0 0  Number falls in past yr: 0 0 - - -  Injury with Fall? 0 0 - - -  Comment - - - - -  Risk for fall due to : No Fall Risks - - - -  Follow up Falls prevention discussed - - - -    FALL RISK PREVENTION PERTAINING TO THE HOME:  Any stairs in or around the home? Yes  If so, are there any without handrails? No  Home free of loose throw rugs in walkways, pet beds, electrical cords, etc? Yes  Adequate lighting in your home to reduce risk of falls? Yes   ASSISTIVE DEVICES UTILIZED TO PREVENT FALLS:  Life alert? No  Use of a cane, walker or w/c? No  Grab bars in the bathroom? No  Shower chair or bench in shower? No  Elevated toilet seat or a handicapped toilet? Yes   TIMED UP AND GO:  Was the test performed? No . Telephonic visit  Cognitive Function: Normal cognitive status assessed by direct observation by this Nurse Health Advisor. No abnormalities found.   MMSE - Mini Mental State Exam 09/23/2018  Orientation to time 5  Orientation to Place 5  Registration 3  Attention/ Calculation 5  Recall 3  Language- name 2 objects 2  Language- repeat 1  Language- follow 3 step command 3  Language- read & follow direction 1  Write a sentence 1  Copy design 1  Total score 30     6CIT Screen 09/28/2020 09/25/2019  What Year? 0 points 0 points  What  month? 0 points 0 points  What time? 0 points 0 points  Count back from 20 0 points 0 points  Months in reverse 0 points 0 points  Repeat phrase 0 points 0 points  Total Score 0 0    Immunizations Immunization History  Administered Date(s) Administered   Fluad Quad(high Dose 65+) 07/09/2019, 05/26/2020, 05/10/2021   Influenza, High Dose Seasonal PF 06/03/2018   Influenza-Unspecified 05/13/2017   Moderna Sars-Covid-2 Vaccination 12/17/2019, 01/14/2020   Pneumococcal Conjugate-13 09/03/2018   Pneumococcal Polysaccharide-23 10/26/2019   Zoster Recombinat (Shingrix) 09/07/2021    TDAP status: Due, Education has been provided regarding the importance of this vaccine. Advised may receive this vaccine at local pharmacy or Health Dept. Aware to provide a copy of the vaccination record if obtained from local pharmacy or Health Dept. Verbalized acceptance and understanding.  Flu Vaccine status: Up to date  Pneumococcal vaccine status: Up to date  Covid-19 vaccine status: Information provided on how to obtain vaccines.   Qualifies for Shingles Vaccine? Yes   Zostavax completed No   Shingrix Completed?: No.    Education has been provided regarding the importance of this vaccine. Patient has been advised to call insurance company to determine out of pocket expense if they have not yet received this vaccine. Advised may also receive vaccine at local pharmacy or Health Dept. Verbalized acceptance and understanding.  Screening Tests Health Maintenance  Topic Date Due   COVID-19 Vaccine (3 - Booster for [employer]  series) 10/15/2021 (Originally 03/10/2020)   TETANUS/TDAP  05/10/2022 (Originally 03/02/1970)   Hepatitis C Screening  05/10/2022 (Originally 03/03/1969)   Zoster Vaccines- Shingrix (2 of 2) 11/02/2021   COLONOSCOPY (Pts 45-51yr Insurance coverage will need to be confirmed)  12/15/2021   MAMMOGRAM  12/28/2021   FOOT EXAM  12/29/2021   HEMOGLOBIN A1C  03/07/2022   DEXA SCAN  05/31/2022    OPHTHALMOLOGY EXAM  06/20/2022   Pneumonia Vaccine 70 Years old  Completed   INFLUENZA VACCINE  Completed   HPV VACCINES  Aged Out    Health Maintenance  There are no preventive care reminders to display for this patient.   Colorectal cancer screening: Type of screening: Colonoscopy. Completed 12/16/2014. Repeat every 7 years - she has appt in March 2023  Mammogram status: Completed 12/28/2020. Repeat every year  Bone Density status: Completed 05/31/2020. Results reflect: Bone density results: OSTEOPENIA. Repeat every 2 years.  Lung Cancer Screening: (Low Dose CT Chest recommended if Age 69-80years, 30 pack-year currently smoking OR have quit w/in 15years.) does not qualify  Additional Screening:  Hepatitis C Screening: does qualify; Due  Vision Screening: Recommended annual ophthalmology exams for early detection of glaucoma and other disorders of the eye. Is the patient up to date with their annual eye exam?  Yes  Who is the provider or what is the name of the office in which the patient attends annual eye exams? [facility] is not established with a provider, would they like to be referred to a provider to establish care? No .   Dental Screening: Recommended annual dental exams for proper oral hygiene  Community Resource Referral / Chronic Care Management: CRR required this visit?  No   CCM required this visit?  No      Plan:     I have personally reviewed and noted the following in the patients chart:   Medical and social history Use of alcohol, tobacco or illicit drugs  Current medications and supplements including opioid prescriptions.  Functional ability and status Nutritional status Physical activity Advanced directives List of other physicians Hospitalizations, surgeries, and ER visits in previous 12 months Vitals Screenings to include cognitive, depression, and falls Referrals and appointments  In addition, I have reviewed and  discussed with patient certain preventive protocols, quality metrics, and best practice recommendations. A written personalized care plan for preventive services as well as general preventive health recommendations were provided to patient.     ASandrea Hammond LPN   [id number]  Nurse Notes: I told pt she is due for tdap - she says she remembers getting this last year with flu shot - I was unable to find any documentation on this chart.

## 2021-09-29 NOTE — Patient Instructions (Signed)
Morgan Schmidt , Thank you for taking time to come for your Medicare Wellness Visit. I appreciate your ongoing commitment to your health goals. Please review the following plan we discussed and let me know if I can assist you in the future.   Screening recommendations/referrals: Colonoscopy: Done 12/16/2014 - Repeat scheduled for March 2023 Mammogram: Done 12/28/2020 - Repeat annually  Bone Density: Done 05/31/2020 - Repeat every 2 years Recommended yearly ophthalmology/optometry visit for glaucoma screening and checkup Recommended yearly dental visit for hygiene and checkup  Vaccinations: Influenza vaccine: Done 05/10/2021 - Repeat annually Pneumococcal vaccine: Done 09/03/2018 & 10/26/2019 Tdap vaccine: Due - recommended every 10 years (I did not see any record of this) Shingles vaccine: Done 09/07/2021 - get second dose in 2-6 months   Covid-19:Done 12/17/2019 & 01/14/2020 - for boosters, contact your pharmacy  Advanced directives: Please bring a copy of your health care power of attorney and living will to the office to be added to your chart at your convenience.   Conditions/risks identified: Aim for 30 minutes of exercise or brisk walking each day, drink 6-8 glasses of water and eat lots of fruits and vegetables.   Next appointment: Follow up in one year for your annual wellness visit    Preventive Care 65 Years and Older, Female Preventive care refers to lifestyle choices and visits with your health care provider that can promote health and wellness. What does preventive care include? A yearly physical exam. This is also called an annual well check. Dental exams once or twice a year. Routine eye exams. Ask your health care provider how often you should have your eyes checked. Personal lifestyle choices, including: Daily care of your teeth and gums. Regular physical activity. Eating a healthy diet. Avoiding tobacco and drug use. Limiting alcohol use. Practicing safe sex. Taking  low-dose aspirin every day. Taking vitamin and mineral supplements as recommended by your health care provider. What happens during an annual well check? The services and screenings done by your health care provider during your annual well check will depend on your age, overall health, lifestyle risk factors, and family history of disease. Counseling  Your health care provider may ask you questions about your: Alcohol use. Tobacco use. Drug use. Emotional well-being. Home and relationship well-being. Sexual activity. Eating habits. History of falls. Memory and ability to understand (cognition). Work and work Statistician. Reproductive health. Screening  You may have the following tests or measurements: Height, weight, and BMI. Blood pressure. Lipid and cholesterol levels. These may be checked every 5 years, or more frequently if you are over 75 years old. Skin check. Lung cancer screening. You may have this screening every year starting at age 50 if you have a 30-pack-year history of smoking and currently smoke or have quit within the past 15 years. Fecal occult blood test (FOBT) of the stool. You may have this test every year starting at age 66. Flexible sigmoidoscopy or colonoscopy. You may have a sigmoidoscopy every 5 years or a colonoscopy every 10 years starting at age 32. Hepatitis C blood test. Hepatitis B blood test. Sexually transmitted disease (STD) testing. Diabetes screening. This is done by checking your blood sugar (glucose) after you have not eaten for a while (fasting). You may have this done every 1-3 years. Bone density scan. This is done to screen for osteoporosis. You may have this done starting at age 76. Mammogram. This may be done every 1-2 years. Talk to your health care provider about how often you  should have regular mammograms. Talk with your health care provider about your test results, treatment options, and if necessary, the need for more tests. Vaccines   Your health care provider may recommend certain vaccines, such as: Influenza vaccine. This is recommended every year. Tetanus, diphtheria, and acellular pertussis (Tdap, Td) vaccine. You may need a Td booster every 10 years. Zoster vaccine. You may need this after age 49. Pneumococcal 13-valent conjugate (PCV13) vaccine. One dose is recommended after age 54. Pneumococcal polysaccharide (PPSV23) vaccine. One dose is recommended after age 39. Talk to your health care provider about which screenings and vaccines you need and how often you need them. This information is not intended to replace advice given to you by your health care provider. Make sure you discuss any questions you have with your health care provider. Document Released: 09/02/2015 Document Revised: 04/25/2016 Document Reviewed: 06/07/2015 Elsevier Interactive Patient Education  2017 [place] Prevention in the Home Falls can cause injuries. They can happen to people of all ages. There are many things you can do to make your home safe and to help prevent falls. What can I do on the outside of my home? Regularly fix the edges of walkways and driveways and fix any cracks. Remove anything that might make you trip as you walk through a door, such as a raised step or threshold. Trim any bushes or trees on the path to your home. Use bright outdoor lighting. Clear any walking paths of anything that might make someone trip, such as rocks or tools. Regularly check to see if handrails are loose or broken. Make sure that both sides of any steps have handrails. Any raised decks and porches should have guardrails on the edges. Have any leaves, snow, or ice cleared regularly. Use sand or salt on walking paths during winter. Clean up any spills in your garage right away. This includes oil or grease spills. What can I do in the bathroom? Use night lights. Install grab bars by the toilet and in the tub and shower. Do not use towel  bars as grab bars. Use non-skid mats or decals in the tub or shower. If you need to sit down in the shower, use a plastic, non-slip stool. Keep the floor dry. Clean up any water that spills on the floor as soon as it happens. Remove soap buildup in the tub or shower regularly. Attach bath mats securely with double-sided non-slip rug tape. Do not have throw rugs and other things on the floor that can make you trip. What can I do in the bedroom? Use night lights. Make sure that you have a light by your bed that is easy to reach. Do not use any sheets or blankets that are too big for your bed. They should not hang down onto the floor. Have a firm chair that has side arms. You can use this for support while you get dressed. Do not have throw rugs and other things on the floor that can make you trip. What can I do in the kitchen? Clean up any spills right away. Avoid walking on wet floors. Keep items that you use a lot in easy-to-reach places. If you need to reach something above you, use a strong step stool that has a grab bar. Keep electrical cords out of the way. Do not use floor polish or wax that makes floors slippery. If you must use wax, use non-skid floor wax. Do not have throw rugs and other things on the  floor that can make you trip. What can I do with my stairs? Do not leave any items on the stairs. Make sure that there are handrails on both sides of the stairs and use them. Fix handrails that are broken or loose. Make sure that handrails are as long as the stairways. Check any carpeting to make sure that it is firmly attached to the stairs. Fix any carpet that is loose or worn. Avoid having throw rugs at the top or bottom of the stairs. If you do have throw rugs, attach them to the floor with carpet tape. Make sure that you have a light switch at the top of the stairs and the bottom of the stairs. If you do not have them, ask someone to add them for you. What else can I do to help  prevent falls? Wear shoes that: Do not have high heels. Have rubber bottoms. Are comfortable and fit you well. Are closed at the toe. Do not wear sandals. If you use a stepladder: Make sure that it is fully opened. Do not climb a closed stepladder. Make sure that both sides of the stepladder are locked into place. Ask someone to hold it for you, if possible. Clearly mark and make sure that you can see: Any grab bars or handrails. First and last steps. Where the edge of each step is. Use tools that help you move around (mobility aids) if they are needed. These include: Canes. Walkers. Scooters. Crutches. Turn on the lights when you go into a dark area. Replace any light bulbs as soon as they burn out. Set up your furniture so you have a clear path. Avoid moving your furniture around. If any of your floors are uneven, fix them. If there are any pets around you, be aware of where they are. Review your medicines with your doctor. Some medicines can make you feel dizzy. This can increase your chance of falling. Ask your doctor what other things that you can do to help prevent falls. This information is not intended to replace advice given to you by your health care provider. Make sure you discuss any questions you have with your health care provider. Document Released: 06/02/2009 Document Revised: 01/12/2016 Document Reviewed: 09/10/2014 Elsevier Interactive Patient Education  2017 [employer].

## 2021-10-27 ENCOUNTER — Other Ambulatory Visit: Payer: Self-pay

## 2021-10-27 ENCOUNTER — Ambulatory Visit (AMBULATORY_SURGERY_CENTER): Payer: Medicare HMO | Admitting: *Deleted

## 2021-10-27 VITALS — Ht 59.0 in | Wt 170.0 lb

## 2021-10-27 DIAGNOSIS — Z86010 Personal history of colonic polyps: Secondary | ICD-10-CM

## 2021-10-27 MED ORDER — PEG 3350-KCL-NA BICARB-NACL 420 G PO SOLR: 4000.0000 mL | Freq: Once | ORAL | 0 refills | Status: AC

## 2021-10-27 NOTE — Progress Notes (Signed)
Patient's pre-visit was done today over the phone with the patient. Name,DOB and address verified. Patient denies any allergies to Eggs and Soy. Patient denies any problems with anesthesia/sedation. Patient is not taking any diet pills or blood thinners. No home Oxygen.  ? ?Prep instructions sent to pt's MyChart and mailed to pt-pt aware. Patient understands to call us back with any questions or concerns. Patient is aware of our care-partner policy and [id number]-60 safety protocol.  ? ?EMMI education assigned to the patient for the procedure, sent to [facility].  ? ?The patient is COVID-19 vaccinated.   ?

## 2021-11-01 ENCOUNTER — Encounter: Payer: Self-pay | Admitting: Internal Medicine

## 2021-11-10 ENCOUNTER — Encounter: Payer: Self-pay | Admitting: Internal Medicine

## 2021-11-10 ENCOUNTER — Ambulatory Visit (AMBULATORY_SURGERY_CENTER): Payer: Medicare HMO | Admitting: Internal Medicine

## 2021-11-10 VITALS — BP 123/52 | HR 68 | Temp 97.5°F | Resp 15 | Ht 59.0 in | Wt 170.0 lb

## 2021-11-10 DIAGNOSIS — D378 Neoplasm of uncertain behavior of other specified digestive organs: Secondary | ICD-10-CM | POA: Diagnosis not present

## 2021-11-10 DIAGNOSIS — D013 Carcinoma in situ of anus and anal canal: Secondary | ICD-10-CM | POA: Diagnosis not present

## 2021-11-10 DIAGNOSIS — D122 Benign neoplasm of ascending colon: Secondary | ICD-10-CM

## 2021-11-10 DIAGNOSIS — E119 Type 2 diabetes mellitus without complications: Secondary | ICD-10-CM | POA: Diagnosis not present

## 2021-11-10 DIAGNOSIS — C210 Malignant neoplasm of anus, unspecified: Secondary | ICD-10-CM

## 2021-11-10 DIAGNOSIS — D124 Benign neoplasm of descending colon: Secondary | ICD-10-CM | POA: Diagnosis not present

## 2021-11-10 DIAGNOSIS — Z86010 Personal history of colonic polyps: Secondary | ICD-10-CM | POA: Diagnosis not present

## 2021-11-10 DIAGNOSIS — K635 Polyp of colon: Secondary | ICD-10-CM | POA: Diagnosis not present

## 2021-11-10 DIAGNOSIS — D120 Benign neoplasm of cecum: Secondary | ICD-10-CM | POA: Diagnosis not present

## 2021-11-10 DIAGNOSIS — E785 Hyperlipidemia, unspecified: Secondary | ICD-10-CM | POA: Diagnosis not present

## 2021-11-10 DIAGNOSIS — D125 Benign neoplasm of sigmoid colon: Secondary | ICD-10-CM

## 2021-11-10 DIAGNOSIS — I10 Essential (primary) hypertension: Secondary | ICD-10-CM | POA: Diagnosis not present

## 2021-11-10 HISTORY — PX: COLONOSCOPY WITH PROPOFOL: SHX5780

## 2021-11-10 MED ORDER — SODIUM CHLORIDE 0.9 % IV SOLN: 500.0000 mL | Freq: Once | INTRAVENOUS | Status: DC

## 2021-11-10 NOTE — Progress Notes (Signed)
Called to room to assist during endoscopic procedure.  Patient ID and intended procedure confirmed with present staff. Received instructions for my participation in the procedure from the performing physician.  

## 2021-11-10 NOTE — Progress Notes (Signed)
A and O x3. Report to RN. Tolerated MAC anesthesia well. 

## 2021-11-10 NOTE — Op Note (Signed)
[facility] ?Patient Name: Morgan Schmidt ?Procedure Date: 11/10/2021 11:08 AM ?MRN: [id number] ?Endoscopist: Jerene Bears , MD ?Age: 71 ?Referring MD:  ?Date of Birth: August 15, 1951 ?Gender: Female ?Account #: [id number] ?Procedure:                Colonoscopy ?Indications:              High risk colon cancer surveillance: Personal  ?                          history of adenoma with villous component with  ?                          additional adenomas and SSPs removed at last  ?                          colonoscopy: 2013 (6 month recall recommended at  ?                          that time due to piecemeal polypectomy) ?Medicines:                Monitored Anesthesia Care ?Procedure:                Pre-Anesthesia Assessment: ?                          - Prior to the procedure, a History and Physical  ?                          was performed, and patient medications and  ?                          allergies were reviewed. The patient's tolerance of  ?                          previous anesthesia was also reviewed. The risks  ?                          and benefits of the procedure and the sedation  ?                          options and risks were discussed with the patient.  ?                          All questions were answered, and informed consent  ?                          was obtained. Prior Anticoagulants: The patient has  ?                          taken no previous anticoagulant or antiplatelet  ?                          agents. ASA Grade Assessment: III - A patient with  ?  severe systemic disease. After reviewing the risks  ?                          and benefits, the patient was deemed in  ?                          satisfactory condition to undergo the procedure. ?                          After obtaining informed consent, the colonoscope  ?                          was passed under direct vision. Throughout the  ?                          procedure, the patient's blood  pressure, pulse, and  ?                          oxygen saturations were monitored continuously. The  ?                          PCF-HQ190L Colonoscope was introduced through the  ?                          anus and advanced to the terminal ileum. The  ?                          colonoscopy was performed without difficulty. The  ?                          patient tolerated the procedure well. The quality  ?                          of the bowel preparation was good. The terminal  ?                          ileum, ileocecal valve, appendiceal orifice, and  ?                          rectum were photographed. ?Scope In: 11:17:34 AM ?Scope Out: 00:93:81 AM ?Scope Withdrawal Time: 0 hours 36 minutes 39 seconds  ?Total Procedure Duration: 0 hours 39 minutes 20 seconds  ?Findings:                 The digital rectal exam was normal. ?                          The terminal ileum appeared normal with small  ?                          diverticula present. ?                          A 3 mm polyp was found in the cecum. The polyp was  ?  sessile. The polyp was removed with a cold snare.  ?                          Resection and retrieval were complete. ?                          A 15 mm polyp was found in the ileocecal valve. The  ?                          polyp was multi-lobulated. The polyp was removed  ?                          with a hot snare. Resection and retrieval were  ?                          complete. Soft coag with snare tip was used to  ?                          ablate the edges after polypectomy. ?                          Two sessile polyps were found in the ascending  ?                          colon. The polyps were 5 to 10 mm in size. These  ?                          polyps were removed with a cold snare. Resection  ?                          and retrieval were complete. ?                          A tattoo was seen in the distal ascending colon  ?                          with  small sessile polyp adjacent. The polyp was  ?                          removed with a cold snare. Resection and retrieval  ?                          were complete. ?                          A 12 mm polyp was found in the descending colon.  ?                          The polyp was sessile. The polyp was removed with a  ?                          hot snare. Resection and retrieval were complete. ?  A 8 mm polyp was found in the descending colon. The  ?                          polyp was sessile. The polyp was removed with a  ?                          cold snare. Resection and retrieval were complete. ?                          A 4 mm polyp was found in the sigmoid colon. The  ?                          polyp was sessile. The polyp was removed with a  ?                          cold snare. Resection and retrieval were complete. ?                          A few small-mouthed diverticula were found in the  ?                          sigmoid colon and descending colon. ?                          An area of nodular mucosa was found at the  ?                          anus/dentate line. The mucosa at minor depression  ?                          in this area. Biopsies were taken with a cold  ?                          forceps for histology to evaluate for AIN. ?Complications:            No immediate complications. ?Estimated Blood Loss:     Estimated blood loss was minimal. ?Impression:               - The examined portion of the ileum was normal.  ?                          Scattered ileal diverticulosis. ?                          - One 3 mm polyp in the cecum, removed with a cold  ?                          snare. Resected and retrieved. ?                          - One 15 mm polyp at the ileocecal valve, removed  ?  with a hot snare. Resected and retrieved. ?                          - Two 5 to 10 mm polyps in the ascending colon,  ?                          removed with  a cold snare. Resected and retrieved. ?                          - A tattoo was seen in the distal ascending colon  ?                          with small polyp. Resected and retrieved. ?                          - One 12 mm polyp in the descending colon, removed  ?                          with a hot snare. Resected and retrieved. ?                          - One 8 mm polyp in the descending colon, removed  ?                          with a cold snare. Resected and retrieved. ?                          - One 4 mm polyp in the sigmoid colon, removed with  ?                          a cold snare. Resected and retrieved. ?                          - Diverticulosis in the sigmoid colon and in the  ?                          descending colon. ?                          - Nodular mucosa at the anus. Biopsied to exclude  ?                          AIN. ?Recommendation:           - Patient has a contact number available for  ?                          emergencies. The signs and symptoms of potential  ?                          delayed complications were discussed with the  ?                          patient.  Return to normal activities tomorrow.  ?                          Written discharge instructions were provided to the  ?                          patient. ?                          - Resume previous diet. ?                          - Continue present medications. ?                          - No aspirin, ibuprofen, naproxen, or other  ?                          non-steroidal anti-inflammatory drugs for 2 weeks  ?                          after polyp removal. ?                          - Await pathology results. ?                          - Repeat colonoscopy is recommended for  ?                          surveillance. The colonoscopy date will be  ?                          determined after pathology results from today's  ?                          exam become available for review. ?Jerene Bears, MD ?11/10/2021 12:07:54  PM ?This report has been signed electronically. ?

## 2021-11-10 NOTE — Progress Notes (Signed)
? ?GASTROENTEROLOGY PROCEDURE H&P NOTE  ? ?Primary Care Physician: ?Claretta Fraise, MD ? ? ? ?Reason for Procedure:  History of polyps, significantly overdue for surveillance ? ?Plan:    Surveillance colonoscopy ? ?Patient is appropriate for endoscopic procedure(s) in the ambulatory ([place]) setting. ? ?The nature of the procedure, as well as the risks, benefits, and alternatives were carefully and thoroughly reviewed with the patient. Ample time for discussion and questions allowed. The patient understood, was satisfied, and agreed to proceed.  ? ? ? ?HPI: ?Morgan Schmidt is a 71 y.o. female who presents for surveillance colonoscopy.  Medical history as below.  Tolerated the prep.  No recent chest pain or shortness of breath.  No abdominal pain today. ? ?Past Medical History:  ?Diagnosis Date  ? Arthritis   ? Cancer [facility]) 2008  ? thyroid CA  ? Closed left hip fracture, initial encounter ([place]) 03/09/2018  ? Diabetes mellitus   ? type 2  ? Hip fracture ([place])   ? History of thyroid cancer 08/24/2011  ? Hyperlipidemia   ? Hypertension   ? Thyroid disease   ? ? ?Past Surgical History:  ?Procedure Laterality Date  ? CESAREAN SECTION    ? COLONOSCOPY  12/06/2011  ? Dr.Lafe Clerk-polyps,tubulovillous polyp  ? FRACTURE SURGERY  2020  ? HIP PINNING,CANNULATED Left 03/10/2018  ? Procedure: CANNULATED HIP PINNING;  Surgeon: Paralee Cancel, MD;  Location: [place];  Service: Orthopedics;  Laterality: Left;  ? KNEE ARTHROSCOPY Left 10/22/2016  ? Procedure: ARTHROSCOPY LEFT KNEE WITH DEBRIDEMENT;  Surgeon: Paralee Cancel, MD;  Location: [facility];  Service: Orthopedics;  Laterality: Left;  ? KNEE ARTHROSCOPY WITH MEDIAL MENISECTOMY  10/22/2016  ? Procedure: KNEE ARTHROSCOPY WITH MEDIAL MENISECTOMY;  Surgeon: Paralee Cancel, MD;  Location: [facility];  Service: Orthopedics;;  ? KNEE ARTHROSCOPY WITH SUBCHONDROPLASTY Left 10/22/2016  ? Procedure: KNEE ARTHROSCOPY WITH MEDIAL CHONDROPLASTY;  Surgeon: Paralee Cancel, MD;  Location: [facility];  Service: Orthopedics;  Laterality: Left;  ? POLYPECTOMY    ? THYROIDECTOMY  08/21/2007  ? TUBAL LIGATION  1985  ? ? ?Prior to Admission medications   ?Medication Sig Start Date End Date Taking? Authorizing Provider  ?Accu-Chek FastClix Lancets MISC Use to check Blood Sugars twice daily. Dx E11.8 10/23/19  Yes Claretta Fraise, MD  ?Alcohol Swabs (B-D SINGLE USE SWABS REGULAR) PADS Use to check Blood Sugars twice daily. Dx E11.8 10/15/19  Yes Claretta Fraise, MD  ?Blood Glucose Calibration (ACCU-CHEK AVIVA) SOLN Use to check Blood Sugars twice daily. Dx E11.8 10/15/19  Yes Claretta Fraise, MD  ?Blood Glucose Monitoring Suppl (ACCU-CHEK AVIVA PLUS) w/Device KIT Use to check Blood Sugars twice daily. Dx E11.8 10/15/19  Yes Claretta Fraise, MD  ?Cholecalciferol (VITAMIN D) 2000 UNITS tablet Take 1 tablet (2,000 Units total) by mouth daily. ?Patient taking differently: Take 2,000 Units by mouth every evening. 03/18/15  Yes Stacks, Cletus Gash, MD  ?diclofenac sodium (VOLTAREN) 1 % GEL Apply 2-4 g topically See admin instructions. Apply 2-4 grams to each hand up to 4 times a day as needed for arthritis in the fingers 12/05/18  Yes Stacks, Cletus Gash, MD  ?Dulaglutide (TRULICITY) 4.5 HM/0.9OB SOPN Inject 4.5 mg as directed once a week. 09/07/21  Yes Claretta Fraise, MD  ?glucose blood (ACCU-CHEK AVIVA PLUS) test strip TEST BLOOD SUGAR TWICE DAILY Dx E11.8 03/06/21  Yes Claretta Fraise, MD  ?insulin glargine (LANTUS SOLOSTAR) 100 UNIT/ML Solostar Pen Inject 110 Units into the skin daily after breakfast. 05/10/21  Yes Stacks, Warren, MD  ?Insulin Pen Needle (DROPLET PEN NEEDLES) 31G X 8 MM MISC Use as direceted daily Dx E11.8 06/26/21  Yes Stacks, Warren, MD  ?levothyroxine (SYNTHROID) 112 MCG tablet Take 1 tablet (112 mcg total) by mouth every other day. Alternate with 125 05/10/21  Yes Stacks, Warren, MD  ?levothyroxine (SYNTHROID) 125 MCG tablet Take 1 tablet (125 mcg total) by mouth every other day.  Alternate with 112 05/10/21  Yes Stacks, Warren, MD  ?metFORMIN (GLUCOPHAGE) 1000 MG tablet Take 1 tablet (1,000 mg total) by mouth 2 (two) times daily with a meal. 05/10/21  Yes Stacks, Warren, MD  ?olmesartan (BENICAR) 20 MG tablet Take 1 tablet (20 mg total) by mouth daily. 05/10/21  Yes Stacks, Warren, MD  ?rosuvastatin (CRESTOR) 20 MG tablet TAKE 1 TABLET EVERY DAY FOR CHOLESTEROL 09/27/21  Yes Stacks, Warren, MD  ?acetaminophen (TYLENOL) 325 MG tablet Take 325-650 mg by mouth every 6 (six) hours as needed (for pain or headaches).    [provider]  ?docusate sodium (COLACE) 100 MG capsule Take 1 capsule (100 mg total) by mouth 2 (two) times daily. 09/07/21   Stacks, Warren, MD  ? ? ?Current Outpatient Medications  ?Medication Sig Dispense Refill  ? Accu-Chek FastClix Lancets MISC Use to check Blood Sugars twice daily. Dx E11.8 204 each 3  ? Alcohol Swabs (B-D SINGLE USE SWABS REGULAR) PADS Use to check Blood Sugars twice daily. Dx E11.8 200 each 3  ? Blood Glucose Calibration (ACCU-CHEK AVIVA) SOLN Use to check Blood Sugars twice daily. Dx E11.8 1 each 3  ? Blood Glucose Monitoring Suppl (ACCU-CHEK AVIVA PLUS) w/Device KIT Use to check Blood Sugars twice daily. Dx E11.8 1 kit 0  ? Cholecalciferol (VITAMIN D) 2000 UNITS tablet Take 1 tablet (2,000 Units total) by mouth daily. (Patient taking differently: Take 2,000 Units by mouth every evening.) 30 tablet 11  ? diclofenac sodium (VOLTAREN) 1 % GEL Apply 2-4 g topically See admin instructions. Apply 2-4 grams to each hand up to 4 times a day as needed for arthritis in the fingers 100 g 11  ? Dulaglutide (TRULICITY) 4.5 MG/0.5ML SOPN Inject 4.5 mg as directed once a week. 6.5 mL 3  ? glucose blood (ACCU-CHEK AVIVA PLUS) test strip TEST BLOOD SUGAR TWICE DAILY Dx E11.8 200 strip 3  ? insulin glargine (LANTUS SOLOSTAR) 100 UNIT/ML Solostar Pen Inject 110 Units into the skin daily after breakfast. 105 mL 3  ? Insulin Pen Needle (DROPLET PEN NEEDLES) 31G X 8 MM  MISC Use as direceted daily Dx E11.8 100 each 3  ? levothyroxine (SYNTHROID) 112 MCG tablet Take 1 tablet (112 mcg total) by mouth every other day. Alternate with 125 45 tablet 3  ? levothyroxine (SYNTHROID) 125 MCG tablet Take 1 tablet (125 mcg total) by mouth every other day. Alternate with 112 45 tablet 3  ? metFORMIN (GLUCOPHAGE) 1000 MG tablet Take 1 tablet (1,000 mg total) by mouth 2 (two) times daily with a meal. 180 tablet 3  ? olmesartan (BENICAR) 20 MG tablet Take 1 tablet (20 mg total) by mouth daily. 90 tablet 3  ? rosuvastatin (CRESTOR) 20 MG tablet TAKE 1 TABLET EVERY DAY FOR CHOLESTEROL 90 tablet 0  ? acetaminophen (TYLENOL) 325 MG tablet Take 325-650 mg by mouth every 6 (six) hours as needed (for pain or headaches).    ? docusate sodium (COLACE) 100 MG capsule Take 1 capsule (100 mg total) by mouth 2 (two) times daily. 180 capsule 3  ? ?  Current Facility-Administered Medications  ?Medication Dose Route Frequency Provider Last Rate Last Admin  ? 0.9 %  sodium chloride infusion  500 mL Intravenous Once Pyrtle, Jay M, MD      ? ? ?Allergies as of 11/10/2021 - Review Complete 11/10/2021  ?Allergen Reaction Noted  ? Lipitor [atorvastatin calcium] Swelling 02/10/2019  ? Ace inhibitors Cough 11/08/2016  ? ? ?Family History  ?Problem Relation Age of Onset  ? Heart disease Mother   ? Arthritis Father   ? Colon cancer Neg Hx   ? Esophageal cancer Neg Hx   ? Rectal cancer Neg Hx   ? Stomach cancer Neg Hx   ? Breast cancer Neg Hx   ? Colon polyps Neg Hx   ? ? ?Social History  ? ?Socioeconomic History  ? Marital status: Significant Other  ?  Spouse name: Not on file  ? Number of children: 3  ? Years of education: 12  ? Highest education level: High school graduate  ?Occupational History  ? Occupation: retired  ?Tobacco Use  ? Smoking status: Former  ?  Packs/day: 0.50  ?  Years: 5.00  ?  Pack years: 2.50  ?  Types: Cigarettes  ?  Quit date: 08/23/2000  ?  Years since quitting: 21.2  ? Smokeless tobacco: Never   ?Vaping Use  ? Vaping Use: Never used  ?Substance and Sexual Activity  ? Alcohol use: No  ? Drug use: No  ? Sexual activity: Not Currently  ?Other Topics Concern  ? Not on file  ?Social History Narrative  ?

## 2021-11-10 NOTE — Progress Notes (Signed)
Pt's states no medical or surgical changes since previsit or office visit. 

## 2021-11-10 NOTE — Patient Instructions (Signed)
Impression/Recommendations: ? ?Polyp and diverticulosis handouts given to patient. ? ?Resume previous diet. ?Continue present medications. ?No aspirin, ibuprofen, naproxen, or other NSAID drugs for 2 weeks.   ? ?Await pathology results. ? ?Repeat colonoscopy recommended for surveillance.  Date to be determined after pathology results reviewed. ? ?YOU HAD AN ENDOSCOPIC PROCEDURE TODAY AT [facility]:   Refer to the procedure report that was given to you for any specific questions about what was found during the examination.  If the procedure report does not answer your questions, please call your gastroenterologist to clarify.  If you requested that your care partner not be given the details of your procedure findings, then the procedure report has been included in a sealed envelope for you to review at your convenience later. ? ?YOU SHOULD EXPECT: Some feelings of bloating in the abdomen. Passage of more gas than usual.  Walking can help get rid of the air that was put into your GI tract during the procedure and reduce the bloating. If you had a lower endoscopy (such as a colonoscopy or flexible sigmoidoscopy) you may notice spotting of blood in your stool or on the toilet paper. If you underwent a bowel prep for your procedure, you may not have a normal bowel movement for a few days. ? ?Please Note:  You might notice some irritation and congestion in your nose or some drainage.  This is from the oxygen used during your procedure.  There is no need for concern and it should clear up in a day or so. ? ?SYMPTOMS TO REPORT IMMEDIATELY: ? ?Following lower endoscopy (colonoscopy or flexible sigmoidoscopy): ? Excessive amounts of blood in the stool ? Significant tenderness or worsening of abdominal pains ? Swelling of the abdomen that is new, acute ? Fever of 100?F or higher ? ?For urgent or emergent issues, a gastroenterologist can be reached at any hour by calling [phone]. ?Do not use MyChart  messaging for urgent concerns.  ? ? ?DIET:  We do recommend a small meal at first, but then you may proceed to your regular diet.  Drink plenty of fluids but you should avoid alcoholic beverages for 24 hours. ? ?ACTIVITY:  You should plan to take it easy for the rest of today and you should NOT DRIVE or use heavy machinery until tomorrow (because of the sedation medicines used during the test).   ? ?FOLLOW UP: ?Our staff will call the number listed on your records 48-72 hours following your procedure to check on you and address any questions or concerns that you may have regarding the information given to you following your procedure. If we do not reach you, we will leave a message.  We will attempt to reach you two times.  During this call, we will ask if you have developed any symptoms of COVID 19. If you develop any symptoms (ie: fever, flu-like symptoms, shortness of breath, cough etc.) before then, please call [phone].  If you test positive for Covid 19 in the 2 weeks post procedure, please call and report this information to [place].   ? ?If any biopsies were taken you will be contacted by phone or by letter within the next 1-3 weeks.  Please call us at [phone] if you have not heard about the biopsies in 3 weeks.  ? ? ?SIGNATURES/CONFIDENTIALITY: ?You and/or your care partner have signed paperwork which will be entered into your electronic medical record.  These signatures attest to the fact that that the  information above on your After Visit Summary has been reviewed and is understood.  Full responsibility of the confidentiality of this discharge information lies with you and/or your care-partner.  ?

## 2021-11-14 ENCOUNTER — Telehealth: Payer: Self-pay

## 2021-11-14 NOTE — Telephone Encounter (Signed)
?  Follow up Call- ? ? ?  11/10/2021  ? 10:44 AM  ?Call back number  ?Post procedure Call Back phone  # [phone]  ?Permission to leave phone message Yes  ?  ? ?Patient questions: ? ?Do you have a fever, pain , or abdominal swelling? No. ?Pain Score  0 * ? ?Have you tolerated food without any problems? Yes.   ? ?Have you been able to return to your normal activities? Yes.   ? ?Do you have any questions about your discharge instructions: ?Diet   No. ?Medications  No. ?Follow up visit  No. ? ?Do you have questions or concerns about your Care? No. ? ?Actions: ?* If pain score is 4 or above: ?No action needed, pain <4. ? ? ?

## 2021-11-16 ENCOUNTER — Other Ambulatory Visit: Payer: Self-pay

## 2021-11-16 DIAGNOSIS — C210 Malignant neoplasm of anus, unspecified: Secondary | ICD-10-CM

## 2021-11-20 ENCOUNTER — Ambulatory Visit: Payer: Self-pay | Admitting: General Surgery

## 2021-11-20 DIAGNOSIS — D013 Carcinoma in situ of anus and anal canal: Secondary | ICD-10-CM | POA: Diagnosis not present

## 2021-11-20 NOTE — H&P (View-Only) (Signed)
? ?REFERRING PHYSICIAN:  Pyrtle, Frances Maywood, MD ? ?PROVIDER:  Monico Blitz, MD ? ?MRN: [id number] ?DOB: [birth year] ?DATE OF ENCOUNTER: 11/20/2021 ? ?Subjective  ? ?Chief Complaint: carcinoma ?  ? ? ?History of Present Illness: ?Morgan Schmidt is a 71 y.o. female who is seen today as an office consultation at the request of Dr. Hilarie Fredrickson for evaluation of carcinoma ?.   ?71 year old female who recently underwent a colonoscopy.  Multiple polyps were resected.  Patient was noted to have an anal canal lesion with nodularity.  Biopsy of this showed AIN grade 3.  She is here today for further evaluation.  She reports regular bowel habits and denies any rectal bleeding. ? ? ?Review of Systems: ?A complete review of systems was obtained from the patient.  I have reviewed this information and discussed as appropriate with the patient.  See HPI as well for other ROS.  ? ? ?Medical History: ?Past Medical History:  ?Diagnosis Date  ? Arthritis   ? Diabetes mellitus without complication (CMS-HCC)   ? History of cancer   ? Thyroid disease   ? ? ?There is no problem list on file for this patient. ? ? ?Past Surgical History:  ?Procedure Laterality Date  ? CESAREAN SECTION    ? CONVERSION PREVIOUS HIP SURGERY TO TOTAL HIP ARTHROPLASTY    ? thyroid surgery    ?  ? ?Allergies  ?Allergen Reactions  ? Atorvastatin Calcium Swelling  ?  Throat swelling  ? Ace Inhibitors Cough  ? ? ?Current Outpatient Medications on File Prior to Visit  ?Medication Sig Dispense Refill  ? docusate (COLACE) 100 MG capsule Take 100 mg by mouth 2 (two) times daily    ? ACCU-CHEK AVIVA PLUS TEST STRP test strip     ? acetaminophen (TYLENOL) 325 MG tablet Take by mouth    ? DROPLET PEN NEEDLE 31 gauge x 5/16" needle     ? LANTUS SOLOSTAR U-100 INSULIN pen injector (concentration 100 units/mL)     ? levothyroxine (SYNTHROID) 125 MCG tablet     ? metFORMIN (GLUCOPHAGE) 1000 MG tablet     ? olmesartan (BENICAR) 20 MG tablet     ? rosuvastatin (CRESTOR) 20 MG  tablet     ? TRULICITY 4.5 OZ/3.6 mL subcutaneous pen injector     ? ?No current facility-administered medications on file prior to visit.  ? ? ?History reviewed. No pertinent family history.  ? ?Social History  ? ?Tobacco Use  ?Smoking Status Former  ? Types: Cigarettes  ?Smokeless Tobacco Never  ?  ? ?Social History  ? ?Socioeconomic History  ? Marital status: Divorced  ?Tobacco Use  ? Smoking status: Former  ?  Types: Cigarettes  ? Smokeless tobacco: Never  ?Vaping Use  ? Vaping Use: Never used  ?Substance and Sexual Activity  ? Alcohol use: Never  ? Drug use: Never  ? ? ?Objective:  ? ? ?Vitals:  ? 11/20/21 0938  ?BP: (!) 140/82  ?Pulse: 94  ?Temp: 36.4 ?C (97.5 ?F)  ?TempSrc: Temporal  ?SpO2: 98%  ?Weight: 78.9 kg (174 lb)  ?Height: 149.9 cm ('4\' 11"'$ )  ?  ? ?Exam ?Gen: NAD ?Abd: soft ?Rectal: Posterior skin tag with erythema and chronic inflammation concerning for dysplasia.  Area of nodularity noted in the anterior anal canal. ? ? ?Labs, Imaging and Diagnostic Testing: ? ?Procedure: Anoscopy ?Surgeon: Marcello Moores ?After the risks and benefits were explained, written consent was obtained for above procedure.  A medical assistant chaperone was present thoroughout  the entire procedure.  ?Anesthesia: none ?Diagnosis: AIN ?Findings: ~1cm anterior anal canal lesion at dentate line ? ?Assessment and Plan:  ?Diagnoses and all orders for this visit: ? ?Anal intraepithelial neoplasia III (AIN III) ? ?  ? ?71 year old female with AIN grade 3 noted in a anterior anal canal lesion.  Patient also has a posterior external lesion concerning for dysplasia.  I have recommended excision of both surgically.  I have also recommended high resolution anoscopy to evaluate the remainder of the anal canal and perianal areas for additional dysplasia.  We discussed the need for ongoing surveillance for these lesions in the future.  We discussed typical postoperative pain and bleeding.  We discussed the etiology of these lesions and the  prognosis if no treatment is administered.  All questions were answered. ?

## 2021-11-20 NOTE — H&P (Signed)
? ?REFERRING PHYSICIAN:  Pyrtle, Frances Maywood, MD ? ?PROVIDER:  Monico Blitz, MD ? ?MRN: [id number] ?DOB: August 15, 1951 ?DATE OF ENCOUNTER: 11/20/2021 ? ?Subjective  ? ?Chief Complaint: carcinoma ?  ? ? ?History of Present Illness: ?Morgan Schmidt is a 71 y.o. female who is seen today as an office consultation at the request of Dr. Hilarie Fredrickson for evaluation of carcinoma ?.   ?71 year old female who recently underwent a colonoscopy.  Multiple polyps were resected.  Patient was noted to have an anal canal lesion with nodularity.  Biopsy of this showed AIN grade 3.  She is here today for further evaluation.  She reports regular bowel habits and denies any rectal bleeding. ? ? ?Review of Systems: ?A complete review of systems was obtained from the patient.  I have reviewed this information and discussed as appropriate with the patient.  See HPI as well for other ROS.  ? ? ?Medical History: ?Past Medical History:  ?Diagnosis Date  ? Arthritis   ? Diabetes mellitus without complication (CMS-HCC)   ? History of cancer   ? Thyroid disease   ? ? ?There is no problem list on file for this patient. ? ? ?Past Surgical History:  ?Procedure Laterality Date  ? CESAREAN SECTION    ? CONVERSION PREVIOUS HIP SURGERY TO TOTAL HIP ARTHROPLASTY    ? thyroid surgery    ?  ? ?Allergies  ?Allergen Reactions  ? Atorvastatin Calcium Swelling  ?  Throat swelling  ? Ace Inhibitors Cough  ? ? ?Current Outpatient Medications on File Prior to Visit  ?Medication Sig Dispense Refill  ? docusate (COLACE) 100 MG capsule Take 100 mg by mouth 2 (two) times daily    ? ACCU-CHEK AVIVA PLUS TEST STRP test strip     ? acetaminophen (TYLENOL) 325 MG tablet Take by mouth    ? DROPLET PEN NEEDLE 31 gauge x 5/16" needle     ? LANTUS SOLOSTAR U-100 INSULIN pen injector (concentration 100 units/mL)     ? levothyroxine (SYNTHROID) 125 MCG tablet     ? metFORMIN (GLUCOPHAGE) 1000 MG tablet     ? olmesartan (BENICAR) 20 MG tablet     ? rosuvastatin (CRESTOR) 20 MG  tablet     ? TRULICITY 4.5 FY/1.0 mL subcutaneous pen injector     ? ?No current facility-administered medications on file prior to visit.  ? ? ?History reviewed. No pertinent family history.  ? ?Social History  ? ?Tobacco Use  ?Smoking Status Former  ? Types: Cigarettes  ?Smokeless Tobacco Never  ?  ? ?Social History  ? ?Socioeconomic History  ? Marital status: Divorced  ?Tobacco Use  ? Smoking status: Former  ?  Types: Cigarettes  ? Smokeless tobacco: Never  ?Vaping Use  ? Vaping Use: Never used  ?Substance and Sexual Activity  ? Alcohol use: Never  ? Drug use: Never  ? ? ?Objective:  ? ? ?Vitals:  ? 11/20/21 0938  ?BP: (!) 140/82  ?Pulse: 94  ?Temp: 36.4 ?C (97.5 ?F)  ?TempSrc: Temporal  ?SpO2: 98%  ?Weight: 78.9 kg (174 lb)  ?Height: 149.9 cm ('4\' 11"'$ )  ?  ? ?Exam ?Gen: NAD ?Abd: soft ?Rectal: Posterior skin tag with erythema and chronic inflammation concerning for dysplasia.  Area of nodularity noted in the anterior anal canal. ? ? ?Labs, Imaging and Diagnostic Testing: ? ?Procedure: Anoscopy ?Surgeon: Marcello Moores ?After the risks and benefits were explained, written consent was obtained for above procedure.  A medical assistant chaperone was present thoroughout  the entire procedure.  ?Anesthesia: none ?Diagnosis: AIN ?Findings: ~1cm anterior anal canal lesion at dentate line ? ?Assessment and Plan:  ?Diagnoses and all orders for this visit: ? ?Anal intraepithelial neoplasia III (AIN III) ? ?  ? ?71 year old female with AIN grade 3 noted in a anterior anal canal lesion.  Patient also has a posterior external lesion concerning for dysplasia.  I have recommended excision of both surgically.  I have also recommended high resolution anoscopy to evaluate the remainder of the anal canal and perianal areas for additional dysplasia.  We discussed the need for ongoing surveillance for these lesions in the future.  We discussed typical postoperative pain and bleeding.  We discussed the etiology of these lesions and the  prognosis if no treatment is administered.  All questions were answered. ?

## 2021-11-23 ENCOUNTER — Encounter (HOSPITAL_BASED_OUTPATIENT_CLINIC_OR_DEPARTMENT_OTHER): Payer: Self-pay | Admitting: General Surgery

## 2021-11-23 ENCOUNTER — Other Ambulatory Visit: Payer: Self-pay

## 2021-11-23 NOTE — Progress Notes (Signed)
Spoke w/ via phone for pre-op interview--- pt ?Lab needs dos---- [employer] and ekg              ?Lab results------ no ?COVID test -----patient states asymptomatic no test needed ?Arrive at ------- 0715 on 11-30-2021 ?NPO after MN NO Solid Food.  Clear liquids from MN until--- 0715 ?Med rec completed ?Medications to take morning of surgery ----- colace, synthroid ?Diabetic medication ----- do not take metformin morning of surgery.  Do half dose lantus insulin morning of surgery ?Patient instructed no nail polish to be worn day of surgery ?Patient instructed to bring photo id and insurance card day of surgery ?Patient aware to have Driver (ride ) / caregiver  for 24 hours after surgery --daughter, nickie ?Patient Special Instructions ----- n/a ?Pre-Op special Istructions ----- n/a ?Patient verbalized understanding of instructions that were given at this phone interview. ?Patient denies shortness of breath, chest pain, fever, cough at this phone interview.  ?

## 2021-11-30 ENCOUNTER — Ambulatory Visit (HOSPITAL_BASED_OUTPATIENT_CLINIC_OR_DEPARTMENT_OTHER): Payer: Medicare HMO | Admitting: Anesthesiology

## 2021-11-30 ENCOUNTER — Other Ambulatory Visit: Payer: Self-pay

## 2021-11-30 ENCOUNTER — Ambulatory Visit (HOSPITAL_BASED_OUTPATIENT_CLINIC_OR_DEPARTMENT_OTHER)
Admission: RE | Admit: 2021-11-30 | Discharge: 2021-11-30 | Disposition: A | Discharge status: Home / Self Care | Payer: Medicare HMO | Attending: General Surgery | Admitting: General Surgery

## 2021-11-30 ENCOUNTER — Encounter (HOSPITAL_BASED_OUTPATIENT_CLINIC_OR_DEPARTMENT_OTHER)
Admission: RE | Disposition: A | Discharge status: Home / Self Care | Payer: Self-pay | Source: Home / Self Care | Attending: General Surgery

## 2021-11-30 ENCOUNTER — Encounter (HOSPITAL_BASED_OUTPATIENT_CLINIC_OR_DEPARTMENT_OTHER): Payer: Self-pay | Admitting: General Surgery

## 2021-11-30 DIAGNOSIS — Z7985 Long-term (current) use of injectable non-insulin antidiabetic drugs: Secondary | ICD-10-CM | POA: Diagnosis not present

## 2021-11-30 DIAGNOSIS — Z794 Long term (current) use of insulin: Secondary | ICD-10-CM

## 2021-11-30 DIAGNOSIS — Z87891 Personal history of nicotine dependence: Secondary | ICD-10-CM | POA: Diagnosis not present

## 2021-11-30 DIAGNOSIS — Z79899 Other long term (current) drug therapy: Secondary | ICD-10-CM | POA: Insufficient documentation

## 2021-11-30 DIAGNOSIS — I10 Essential (primary) hypertension: Secondary | ICD-10-CM

## 2021-11-30 DIAGNOSIS — Z7984 Long term (current) use of oral hypoglycemic drugs: Secondary | ICD-10-CM | POA: Diagnosis not present

## 2021-11-30 DIAGNOSIS — M1990 Unspecified osteoarthritis, unspecified site: Secondary | ICD-10-CM | POA: Diagnosis not present

## 2021-11-30 DIAGNOSIS — K6282 Dysplasia of anus: Secondary | ICD-10-CM | POA: Diagnosis not present

## 2021-11-30 DIAGNOSIS — E039 Hypothyroidism, unspecified: Secondary | ICD-10-CM | POA: Diagnosis not present

## 2021-11-30 DIAGNOSIS — D013 Carcinoma in situ of anus and anal canal: Secondary | ICD-10-CM | POA: Diagnosis not present

## 2021-11-30 DIAGNOSIS — D045 Carcinoma in situ of skin of trunk: Secondary | ICD-10-CM | POA: Diagnosis not present

## 2021-11-30 DIAGNOSIS — E119 Type 2 diabetes mellitus without complications: Secondary | ICD-10-CM | POA: Diagnosis not present

## 2021-11-30 DIAGNOSIS — C211 Malignant neoplasm of anal canal: Secondary | ICD-10-CM | POA: Diagnosis not present

## 2021-11-30 HISTORY — PX: ANAL INTRAEPITHELIAL NEOPLASIA EXCISION: SHX5241

## 2021-11-30 HISTORY — PX: HIGH RESOLUTION ANOSCOPY: SHX6345

## 2021-11-30 HISTORY — DX: Personal history of adenomatous and serrated colon polyps: Z86.0101

## 2021-11-30 HISTORY — DX: Personal history of colonic polyps: Z86.010

## 2021-11-30 HISTORY — DX: Type 2 diabetes mellitus without complications: E11.9

## 2021-11-30 HISTORY — DX: Unspecified osteoarthritis, unspecified site: M19.90

## 2021-11-30 HISTORY — DX: Dysplasia of anus: K62.82

## 2021-11-30 HISTORY — DX: Presence of dental prosthetic device (complete) (partial): Z97.2

## 2021-11-30 LAB — POCT I-STAT, CHEM 8
BUN: 12 mg/dL (ref 8–23)
Calcium, Ion: 1 mmol/L — ABNORMAL LOW (ref 1.15–1.40)
Chloride: 100 mmol/L (ref 98–111)
Creatinine, Ser: 0.6 mg/dL (ref 0.44–1.00)
Glucose, Bld: 145 mg/dL — ABNORMAL HIGH (ref 70–99)
HCT: 41 % (ref 36.0–46.0)
Hemoglobin: 13.9 g/dL (ref 12.0–15.0)
Potassium: 6.1 mmol/L — ABNORMAL HIGH (ref 3.5–5.1)
Sodium: 137 mmol/L (ref 135–145)
TCO2: 29 mmol/L (ref 22–32)

## 2021-11-30 LAB — GLUCOSE, CAPILLARY: Glucose-Capillary: 148 mg/dL — ABNORMAL HIGH (ref 70–99)

## 2021-11-30 SURGERY — EXCISION, NEOPLASM, INTRAEPITHELIAL, ANUS
Anesthesia: Monitor Anesthesia Care | Site: Rectum

## 2021-11-30 MED ORDER — 0.9 % SODIUM CHLORIDE (POUR BTL) OPTIME
TOPICAL | Status: DC | PRN
  Administered 2021-11-30: 500 mL

## 2021-11-30 MED ORDER — ONDANSETRON HCL 4 MG/2ML IJ SOLN
INTRAMUSCULAR | Status: DC | PRN
  Administered 2021-11-30: 4 mg via INTRAVENOUS

## 2021-11-30 MED ORDER — ACETAMINOPHEN 500 MG PO TABS
ORAL_TABLET | ORAL | Status: AC
  Filled 2021-11-30: qty 2

## 2021-11-30 MED ORDER — TRAMADOL HCL 50 MG PO TABS: 50.0000 mg | ORAL_TABLET | Freq: Four times a day (QID) | ORAL | 0 refills | Status: DC | PRN

## 2021-11-30 MED ORDER — MIDAZOLAM HCL 5 MG/5ML IJ SOLN
INTRAMUSCULAR | Status: DC | PRN
  Administered 2021-11-30: 1 mg via INTRAVENOUS

## 2021-11-30 MED ORDER — KETAMINE HCL 10 MG/ML IJ SOLN
INTRAMUSCULAR | Status: DC | PRN
  Administered 2021-11-30: 10 mg via INTRAVENOUS

## 2021-11-30 MED ORDER — FENTANYL CITRATE (PF) 100 MCG/2ML IJ SOLN
INTRAMUSCULAR | Status: AC
  Filled 2021-11-30: qty 2

## 2021-11-30 MED ORDER — PHENYLEPHRINE 40 MCG/ML (10ML) SYRINGE FOR IV PUSH (FOR BLOOD PRESSURE SUPPORT)
PREFILLED_SYRINGE | INTRAVENOUS | Status: DC | PRN
  Administered 2021-11-30 (×3): 80 ug via INTRAVENOUS

## 2021-11-30 MED ORDER — PHENYLEPHRINE 40 MCG/ML (10ML) SYRINGE FOR IV PUSH (FOR BLOOD PRESSURE SUPPORT)
PREFILLED_SYRINGE | INTRAVENOUS | Status: AC
  Filled 2021-11-30: qty 10

## 2021-11-30 MED ORDER — FENTANYL CITRATE (PF) 100 MCG/2ML IJ SOLN
INTRAMUSCULAR | Status: DC | PRN
  Administered 2021-11-30: 25 ug via INTRAVENOUS
  Administered 2021-11-30: 50 ug via INTRAVENOUS
  Administered 2021-11-30: 25 ug via INTRAVENOUS

## 2021-11-30 MED ORDER — PROPOFOL 500 MG/50ML IV EMUL
INTRAVENOUS | Status: DC | PRN
  Administered 2021-11-30: 150 ug/kg/min via INTRAVENOUS

## 2021-11-30 MED ORDER — KETAMINE HCL 50 MG/5ML IJ SOSY
PREFILLED_SYRINGE | INTRAMUSCULAR | Status: AC
  Filled 2021-11-30: qty 5

## 2021-11-30 MED ORDER — BUPIVACAINE LIPOSOME 1.3 % IJ SUSP: 20.0000 mL | Freq: Once | INTRAMUSCULAR | Status: DC

## 2021-11-30 MED ORDER — PROPOFOL 500 MG/50ML IV EMUL
INTRAVENOUS | Status: AC
  Filled 2021-11-30: qty 50

## 2021-11-30 MED ORDER — KETOROLAC TROMETHAMINE 30 MG/ML IJ SOLN
INTRAMUSCULAR | Status: AC
  Filled 2021-11-30: qty 1

## 2021-11-30 MED ORDER — LIDOCAINE HCL (PF) 2 % IJ SOLN
INTRAMUSCULAR | Status: AC
  Filled 2021-11-30: qty 5

## 2021-11-30 MED ORDER — SODIUM CHLORIDE 0.9% FLUSH: 3.0000 mL | Freq: Two times a day (BID) | INTRAVENOUS | Status: DC

## 2021-11-30 MED ORDER — DEXAMETHASONE SODIUM PHOSPHATE 4 MG/ML IJ SOLN
INTRAMUSCULAR | Status: DC | PRN
  Administered 2021-11-30: 5 mg via INTRAVENOUS

## 2021-11-30 MED ORDER — PROPOFOL 1000 MG/100ML IV EMUL
INTRAVENOUS | Status: AC
  Filled 2021-11-30: qty 100

## 2021-11-30 MED ORDER — ACETAMINOPHEN 500 MG PO TABS: 1000.0000 mg | ORAL_TABLET | ORAL | Status: DC

## 2021-11-30 MED ORDER — BUPIVACAINE-EPINEPHRINE 0.5% -1:200000 IJ SOLN
INTRAMUSCULAR | Status: DC | PRN
  Administered 2021-11-30: 20 mL

## 2021-11-30 MED ORDER — MIDAZOLAM HCL 2 MG/2ML IJ SOLN
INTRAMUSCULAR | Status: AC
  Filled 2021-11-30: qty 2

## 2021-11-30 MED ORDER — LACTATED RINGERS IV SOLN: INTRAVENOUS | Status: DC

## 2021-11-30 MED ORDER — PROPOFOL 10 MG/ML IV BOLUS
INTRAVENOUS | Status: DC | PRN
  Administered 2021-11-30: 10 mg via INTRAVENOUS

## 2021-11-30 MED ORDER — ONDANSETRON HCL 4 MG/2ML IJ SOLN
INTRAMUSCULAR | Status: AC
  Filled 2021-11-30: qty 2

## 2021-11-30 MED ORDER — KETOROLAC TROMETHAMINE 30 MG/ML IJ SOLN
INTRAMUSCULAR | Status: DC | PRN
  Administered 2021-11-30: 15 mg via INTRAVENOUS

## 2021-11-30 MED ORDER — ACETIC ACID 5 % SOLN
Status: DC | PRN
  Administered 2021-11-30: 1 via TOPICAL

## 2021-11-30 MED ORDER — ACETAMINOPHEN 500 MG PO TABS
1000.0000 mg | ORAL_TABLET | Freq: Once | ORAL | Status: AC
  Administered 2021-11-30: 1000 mg via ORAL

## 2021-11-30 SURGICAL SUPPLY — 37 items
APL SKNCLS STERI-STRIP NONHPOA (GAUZE/BANDAGES/DRESSINGS) ×1
APL SWBSTK 6 STRL LF DISP (MISCELLANEOUS)
APPLICATOR COTTON TIP 6 STRL (MISCELLANEOUS) IMPLANT
APPLICATOR COTTON TIP 6IN STRL (MISCELLANEOUS)
BENZOIN TINCTURE PRP APPL 2/3 (GAUZE/BANDAGES/DRESSINGS) ×2 IMPLANT
BLADE EXTENDED COATED 6.5IN (ELECTRODE) IMPLANT
BLADE SURG 10 STRL SS (BLADE) ×2 IMPLANT
COVER BACK TABLE 60X90IN (DRAPES) ×2 IMPLANT
DECANTER SPIKE VIAL GLASS SM (MISCELLANEOUS) ×1 IMPLANT
DRAPE LAPAROTOMY 100X72 PEDS (DRAPES) ×2 IMPLANT
DRAPE UTILITY XL STRL (DRAPES) ×2 IMPLANT
DRSG PAD ABDOMINAL 8X10 ST (GAUZE/BANDAGES/DRESSINGS) ×2 IMPLANT
ELECT REM PT RETURN 9FT ADLT (ELECTROSURGICAL) ×2
ELECTRODE REM PT RTRN 9FT ADLT (ELECTROSURGICAL) ×1 IMPLANT
GAUZE 4X4 16PLY ~~LOC~~+RFID DBL (SPONGE) ×2 IMPLANT
GAUZE SPONGE 4X4 12PLY STRL (GAUZE/BANDAGES/DRESSINGS) IMPLANT
GAUZE SPONGE 4X4 8PLY NS (GAUZE/BANDAGES/DRESSINGS) ×1 IMPLANT
GLOVE BIO SURGEON STRL SZ 6.5 (GLOVE) ×2 IMPLANT
GLOVE SURG UNDER LTX SZ6.5 (GLOVE) ×2 IMPLANT
KIT SIGMOIDOSCOPE (SET/KITS/TRAYS/PACK) IMPLANT
KIT TURNOVER CYSTO (KITS) ×2 IMPLANT
NEEDLE HYPO 22GX1.5 SAFETY (NEEDLE) ×2 IMPLANT
NS IRRIG 500ML POUR BTL (IV SOLUTION) ×3 IMPLANT
PACK BASIN DAY SURGERY FS (CUSTOM PROCEDURE TRAY) ×2 IMPLANT
PAD ARMBOARD 7.5X6 YLW CONV (MISCELLANEOUS) IMPLANT
PANTS MESH DISP LRG (UNDERPADS AND DIAPERS) ×1 IMPLANT
PANTS MESH DISPOSABLE L (UNDERPADS AND DIAPERS) ×1
PENCIL SMOKE EVACUATOR (MISCELLANEOUS) ×2 IMPLANT
SPONGE SURGIFOAM ABS GEL 12-7 (HEMOSTASIS) IMPLANT
SUT CHROMIC 2 0 SH (SUTURE) ×1 IMPLANT
SUT CHROMIC 3 0 SH 27 (SUTURE) ×1 IMPLANT
SYR BULB IRRIG 60ML STRL (SYRINGE) ×2 IMPLANT
SYR CONTROL 10ML LL (SYRINGE) ×2 IMPLANT
TOWEL OR 17X26 10 PK STRL BLUE (TOWEL DISPOSABLE) ×2 IMPLANT
TRAY DSU PREP LF (CUSTOM PROCEDURE TRAY) ×2 IMPLANT
TUBE CONNECTING 12X1/4 (SUCTIONS) ×2 IMPLANT
YANKAUER SUCT BULB TIP NO VENT (SUCTIONS) IMPLANT

## 2021-11-30 NOTE — Transfer of Care (Signed)
Immediate Anesthesia Transfer of Care Note ? ?Patient: Morgan Schmidt ? ?Procedure(s) Performed: Procedure(s) (LRB): ?EXCISIONAL BIOPSY ANAL LESIONS (N/A) ?HIGH RESOLUTION ANOSCOPY (N/A) ? ?Patient Location: PACU ? ?Anesthesia Type: MAC ? ?Level of Consciousness: awake, sedated, patient cooperative and responds to stimulation ? ?Airway & Oxygen Therapy: Patient Spontanous Breathing and Patient connected to [place] 02  ? ?Post-op Assessment: Report given to PACU RN, Post -op Vital signs reviewed and stable and Patient moving all extremities ? ?Post vital signs: Reviewed and stable ? ?Complications: No apparent anesthesia complications ?

## 2021-11-30 NOTE — Anesthesia Postprocedure Evaluation (Signed)
Anesthesia Post Note ? ?Patient: Morgan Schmidt ? ?Procedure(s) Performed: EXCISIONAL BIOPSY ANAL LESIONS (Rectum) ?HIGH RESOLUTION ANOSCOPY (Rectum) ? ?  ? ?Patient location during evaluation: PACU ?Anesthesia Type: MAC ?Level of consciousness: awake and alert ?Pain management: pain level controlled ?Vital Signs Assessment: post-procedure vital signs reviewed and stable ?Respiratory status: spontaneous breathing, nonlabored ventilation and respiratory function stable ?Cardiovascular status: blood pressure returned to baseline and stable ?Postop Assessment: no apparent nausea or vomiting ?Anesthetic complications: no ? ? ?No notable events documented. ? ?Last Vitals:  ?Vitals:  ? 11/30/21 1051 11/30/21 1119  ?BP:  134/62  ?Pulse: 81 81  ?Resp: 17 15  ?Temp: (!) 36.4 ?C 36.5 ?C  ?SpO2: 97% 95%  ?  ?Last Pain:  ?Vitals:  ? 11/30/21 1119  ?TempSrc:   ?PainSc: 0-No pain  ? ? ?  ?  ?  ?  ?  ?  ? ?Lidia Collum ? ? ? ? ?

## 2021-11-30 NOTE — Discharge Instructions (Addendum)
Beginning the day after surgery:  You may sit in a tub of warm water 2-3 times a day to relieve discomfort.  Eat a regular diet high in fiber.  Avoid foods that give you constipation or diarrhea.  Avoid foods that are difficult to digest, such as seeds, nuts, corn or popcorn.  Do not go any longer than 2 days without a bowel movement.  You may take a dose of Milk of Magnesia if you become constipated.    Drink 6-8 glasses of water daily.  Walking is encouraged.  Avoid strenuous activity and heavy lifting for one month after surgery.    Call the office if you have any questions or concerns.  Call immediately if you develop:  Excessive rectal bleeding (more than a cup or passing large clots) Increased discomfort Fever greater than 100 F Difficulty urinating  Post Anesthesia Home Care Instructions  Activity: Get plenty of rest for the remainder of the day. A responsible adult should stay with you for 24 hours following the procedure.  For the next 24 hours, DO NOT: -Drive a car -Operate machinery -Drink alcoholic beverages -Take any medication unless instructed by your physician -Make any legal decisions or sign important papers.  Meals: Start with liquid foods such as gelatin or soup. Progress to regular foods as tolerated. Avoid greasy, spicy, heavy foods. If nausea and/or vomiting occur, drink only clear liquids until the nausea and/or vomiting subsides. Call your physician if vomiting continues.  Special Instructions/Symptoms: Your throat may feel dry or sore from the anesthesia or the breathing tube placed in your throat during surgery. If this causes discomfort, gargle with warm salt water. The discomfort should disappear within 24 hours.      

## 2021-11-30 NOTE — Interval H&P Note (Signed)
History and Physical Interval Note: ? ?11/30/2021 ?7:20 AM ? ?Morgan Schmidt  has presented today for surgery, with the diagnosis of ANAL INTRAEPITHELIAL NEOPLASIA III.  The various methods of treatment have been discussed with the patient and family. After consideration of risks, benefits and other options for treatment, the patient has consented to  Procedure(s): ?EXCISIONAL BIOPSY ANAL LESIONS (N/A) ?HIGH RESOLUTION ANOSCOPY (N/A) as a surgical intervention.  The patient's history has been reviewed, patient examined, no change in status, stable for surgery.  I have reviewed the patient's chart and labs.  Questions were answered to the patient's satisfaction.   ? ? ?Rosario Adie, MD ? ?Colorectal and General Surgery ?[facility]  ? ?

## 2021-11-30 NOTE — Anesthesia Preprocedure Evaluation (Signed)
Anesthesia Evaluation  ?Patient identified by MRN, date of birth, ID band ?Patient awake ? ? ? ?Reviewed: ?Allergy & Precautions, NPO status , Patient's Chart, lab work & pertinent test results ? ?History of Anesthesia Complications ?Negative for: history of anesthetic complications ? ?Airway ?Mallampati: III ? ?TM Distance: >3 FB ?Neck ROM: Full ? ? ? Dental ? ?(+) Upper Dentures ?  ?Pulmonary ?neg pulmonary ROS, former smoker,  ?  ?Pulmonary exam normal ? ? ? ? ? ? ? Cardiovascular ?hypertension, Pt. on medications ?Normal cardiovascular exam ? ? ?  ?Neuro/Psych ?negative neurological ROS ?   ? GI/Hepatic ?negative GI ROS, Neg liver ROS,   ?Endo/Other  ?diabetes, Type 2, Insulin Dependent, Oral Hypoglycemic AgentsHypothyroidism  ? Renal/GU ?negative Renal ROS  ?negative genitourinary ?  ?Musculoskeletal ? ?(+) Arthritis ,  ? Abdominal ?  ?Peds ? Hematology ?negative hematology ROS ?(+)   ?Anesthesia Other Findings ? ? Reproductive/Obstetrics ? ?  ? ? ? ? ? ? ? ? ? ? ? ? ? ?  ?  ? ? ? ? ? ? ? ? ?Anesthesia Physical ?Anesthesia Plan ? ?ASA: 3 ? ?Anesthesia Plan: MAC  ? ?Post-op Pain Management: Tylenol PO (pre-op)* and Minimal or no pain anticipated  ? ?Induction: Intravenous ? ?PONV Risk Score and Plan: 2 and Propofol infusion, TIVA and Treatment may vary due to age or medical condition ? ?Airway Management Planned: Natural Airway, Nasal Cannula and Simple Face Mask ? ?Additional Equipment: None ? ?Intra-op Plan:  ? ?Post-operative Plan:  ? ?Informed Consent: I have reviewed the patients History and Physical, chart, labs and discussed the procedure including the risks, benefits and alternatives for the proposed anesthesia with the patient or authorized representative who has indicated his/her understanding and acceptance.  ? ? ? ? ? ?Plan Discussed with:  ? ?Anesthesia Plan Comments:   ? ? ? ? ? ? ?Anesthesia Quick Evaluation ? ?

## 2021-11-30 NOTE — Op Note (Signed)
11/30/2021 ? ?10:01 AM ? ?PATIENT:  Morgan Schmidt  71 y.o. female ? ?Patient Care Team: ?Claretta Fraise, MD as PCP - General (Family Medicine) ? ?PRE-OPERATIVE DIAGNOSIS:  ANAL INTRAEPITHELIAL NEOPLASIA III ? ?POST-OPERATIVE DIAGNOSIS:  ANAL INTRAEPITHELIAL NEOPLASIA III ? ?PROCEDURE: EXCISIONAL BIOPSY ANAL LESIONS ?HIGH RESOLUTION ANOSCOPY ? ?  Surgeon(s): ?Leighton Ruff, MD ? ?ASSISTANT: none  ? ?ANESTHESIA:   local and MAC ? ?SPECIMEN:  Source of Specimen:  posterior perianal lesion, anterior anal canal ? ?DISPOSITION OF SPECIMEN:  PATHOLOGY ? ?COUNTS:  YES ? ?PLAN OF CARE: Discharge to home after PACU ? ?PATIENT DISPOSITION:  PACU - hemodynamically stable. ? ?INDICATION: 71 y.o. F anal canal lesion biopsied during colonoscopy, which showed AIN 3 ? ? ?OR FINDINGS: distal anterior anal canal acetowhite staining as well as anterior midline nodularity at the dentate line (non discrete lesion), discrete posterior anal mass with acetowhite staining ? ?DESCRIPTION: the patient was identified in the preoperative holding area and taken to the OR where they were laid on the operating room table.  MAC anesthesia was induced without difficulty. The patient was then positioned in prone jackknife position with buttocks gently taped apart.  The patient was then prepped and draped in usual sterile fashion.  SCDs were noted to be in place prior to the initiation of anesthesia. A surgical timeout was performed indicating the correct patient, procedure, positioning and need for preoperative antibiotics.  A rectal block was performed using Marcaine with epinephrine.   ? ?I began with a digital rectal exam.  There was some nodularity noted in the anal canal anteriorly.  I then placed a Hill-Ferguson anoscope into the anal canal and evaluated this completely.  The patient has some grade 2 mildly inflamed internal hemorrhoids.  No other pathology was noted.  An acetic acid soaked sponge was placed in the anal canal and one was  placed in the perianal area as well and left to soak for 2 minutes.  Once this was completed the entire perianal region was evaluated with colposcopy scope.  There was an area in the posterior midline with an associated lesion that stained acetowhite.  This was excised using Metzenbaum scissors and sent to pathology for further examination.  The site was closed using interrupted 2-0 chromic sutures.  The internal anal canal was evaluated with a colposcope as well.  There was some acetowhite staining in the distal anterior anal canal.   The nodularity palpated at the dentate line at anterior midline was mildly up taking stain.  Both of these areas were biopsied using representative specimens, as there was no obvious discrete lesion.  These were both sent to pathology as anterior anal canal biopsy.  The sites were closed using 3-0 chromic sutures.  Hemostasis was good.  Marcaine was placed around the incision sites.  A sterile dressing was then applied.  The patient was then awakened from anesthesia and sent to the postanesthesia care unit in stable condition.  All counts were correct per operating room staff. ? ?Rosario Adie, MD ? ?Colorectal and General Surgery ?[facility] ? ?

## 2021-12-01 LAB — SURGICAL PATHOLOGY

## 2021-12-04 ENCOUNTER — Encounter (HOSPITAL_BASED_OUTPATIENT_CLINIC_OR_DEPARTMENT_OTHER): Payer: Self-pay | Admitting: General Surgery

## 2021-12-06 ENCOUNTER — Encounter: Payer: Self-pay | Admitting: Family Medicine

## 2021-12-06 ENCOUNTER — Ambulatory Visit (INDEPENDENT_AMBULATORY_CARE_PROVIDER_SITE_OTHER): Payer: Medicare HMO | Admitting: Family Medicine

## 2021-12-06 VITALS — BP 177/74 | HR 72 | Temp 97.4°F | Ht 59.0 in | Wt 172.8 lb

## 2021-12-06 DIAGNOSIS — E1169 Type 2 diabetes mellitus with other specified complication: Secondary | ICD-10-CM

## 2021-12-06 DIAGNOSIS — E038 Other specified hypothyroidism: Secondary | ICD-10-CM | POA: Diagnosis not present

## 2021-12-06 DIAGNOSIS — E782 Mixed hyperlipidemia: Secondary | ICD-10-CM

## 2021-12-06 DIAGNOSIS — Z794 Long term (current) use of insulin: Secondary | ICD-10-CM | POA: Diagnosis not present

## 2021-12-06 DIAGNOSIS — E118 Type 2 diabetes mellitus with unspecified complications: Secondary | ICD-10-CM | POA: Diagnosis not present

## 2021-12-06 DIAGNOSIS — I10 Essential (primary) hypertension: Secondary | ICD-10-CM | POA: Diagnosis not present

## 2021-12-06 LAB — BAYER DCA HB A1C WAIVED: HB A1C (BAYER DCA - WAIVED): 7.4 % — ABNORMAL HIGH (ref 4.8–5.6)

## 2021-12-06 MED ORDER — OLMESARTAN MEDOXOMIL 40 MG PO TABS: 40.0000 mg | ORAL_TABLET | Freq: Every day | ORAL | 0 refills | Status: DC

## 2021-12-06 NOTE — Progress Notes (Signed)
? ?Subjective:  ?[name],  ?female    DOB: [birth year]  Age: 71 y.o.  ? ? ?CC: Medical Management of Chronic Issues ? ? ?HPI ?Patient had surgery for anal cancer 6 days ago.  Still has some discomfort but she is limiting herself to Tylenol.  She does not do well with opiate-based pain medicines.  She relates reaction that occurred 40 years ago.  However she is tolerating the pain, so she does not desire anything else at this time. ? ?AVENELL SELLERS presents for  follow-up of hypertension. Patient has no history of headache chest pain or shortness of breath or recent cough. Patient also denies symptoms of TIA such as numbness weakness lateralizing. Patient denies side effects from medication. States taking it regularly. ? ?Patient also  in for follow-up of elevated cholesterol. Doing well without complaints on current medication. Denies side effects  including myalgia and arthralgia and nausea. Also in today for liver function testing. Currently no chest pain, shortness of breath or other cardiovascular related symptoms noted. ? ?Follow-up of diabetes. Patient does check blood sugar at home. Readings run between 110 fasting and occasionally under 100 which makes her light headed ?Patient denies symptoms such as excessive hunger or urinary frequency, excessive hunger, nausea ?No significant hypoglycemic spells noted. ?Medications reviewed. Pt reports taking them regularly. Pt. denies complication/adverse reaction today.  ? ? follow-up on  thyroid. The patient has a history of hypothyroidism for many years. It has been stable recently. Pt. denies any change in  voice, loss of hair, heat or cold intolerance. Energy level has been adequate to good. Patient denies constipation and diarrhea. No myxedema. Medication is as noted below. Verified that pt is taking it daily on an empty stomach. Well tolerated. ? ? ? ?History ?Ersa has a past medical history of AIN (anal intraepithelial neoplasia) anal  canal, History of adenomatous polyp of colon, History of thyroid cancer (06/2008), Hyperlipidemia, Hypertension, Hypothyroidism, postsurgical (06/2008), OA (osteoarthritis), Type 2 diabetes mellitus treated with insulin ([place]), and Wears dentures.  ? ?She has a past surgical history that includes Total thyroidectomy (Bilateral, 06/30/2008); Cesarean section (1985); Knee arthroscopy (Left, 10/22/2016); Knee arthroscopy with medial menisectomy (10/22/2016); Knee arthroscopy with subchondroplasty (Left, 10/22/2016); Hip pinning, cannulated (Left, 03/10/2018); Colonoscopy with propofol (11/10/2021); Anal intraepithelial neoplasia excision (N/A, 11/30/2021); and High resolution anoscopy (N/A, 11/30/2021).  ? ?Her family history includes Arthritis in her father; Heart disease in her mother.She reports that she quit smoking about 21 years ago. Her smoking use included cigarettes. She has a 2.50 pack-year smoking history. She has never used smokeless tobacco. She reports that she does not drink alcohol and does not use drugs. ? ?Current Outpatient Medications on File Prior to Visit  ?Medication Sig Dispense Refill  ? Accu-Chek FastClix Lancets MISC Use to check Blood Sugars twice daily. Dx E11.8 204 each 3  ? acetaminophen (TYLENOL) 325 MG tablet Take 325-650 mg by mouth every 6 (six) hours as needed (for pain or headaches).    ? Alcohol Swabs (B-D SINGLE USE SWABS REGULAR) PADS Use to check Blood Sugars twice daily. Dx E11.8 200 each 3  ? Blood Glucose Calibration (ACCU-CHEK AVIVA) SOLN Use to check Blood Sugars twice daily. Dx E11.8 1 each 3  ? Blood Glucose Monitoring Suppl (ACCU-CHEK AVIVA PLUS) w/Device KIT Use to check Blood Sugars twice daily. Dx E11.8 1 kit 0  ? Cholecalciferol (VITAMIN D) 2000 UNITS tablet Take 1 tablet (2,000 Units total) by mouth daily. (Patient taking  differently: Take 2,000 Units by mouth every evening.) 30 tablet 11  ? diclofenac sodium (VOLTAREN) 1 % GEL Apply 2-4 g topically See admin  instructions. Apply 2-4 grams to each hand up to 4 times a day as needed for arthritis in the fingers (Patient taking differently: Apply 2-4 g topically See admin instructions. Apply 2-4 grams to each hand up to 4 times a day as needed for arthritis in the fingers) 100 g 11  ? docusate sodium (COLACE) 100 MG capsule Take 1 capsule (100 mg total) by mouth 2 (two) times daily. 180 capsule 3  ? Dulaglutide (TRULICITY) 4.5 WI/0.9BD SOPN Inject 4.5 mg as directed once a week. (Patient taking differently: Inject 4.5 mg as directed once a week. Monday's) 6.5 mL 3  ? glucose blood (ACCU-CHEK AVIVA PLUS) test strip TEST BLOOD SUGAR TWICE DAILY Dx E11.8 200 strip 3  ? insulin glargine (LANTUS SOLOSTAR) 100 UNIT/ML Solostar Pen Inject 110 Units into the skin daily after breakfast. 105 mL 3  ? Insulin Pen Needle (DROPLET PEN NEEDLES) 31G X 8 MM MISC Use as direceted daily Dx E11.8 100 each 3  ? levothyroxine (SYNTHROID) 112 MCG tablet Take 1 tablet (112 mcg total) by mouth every other day. Alternate with 125 (Patient taking differently: Take 112 mcg by mouth every other day. Alternate with 125 ?Takes in am) 45 tablet 3  ? levothyroxine (SYNTHROID) 125 MCG tablet Take 1 tablet (125 mcg total) by mouth every other day. Alternate with 112 (Patient taking differently: Take 125 mcg by mouth every other day. Alternate with 112 ?Takes in am) 45 tablet 3  ? metFORMIN (GLUCOPHAGE) 1000 MG tablet Take 1 tablet (1,000 mg total) by mouth 2 (two) times daily with a meal. (Patient taking differently: Take 1,000 mg by mouth 2 (two) times daily with a meal.) 180 tablet 3  ? rosuvastatin (CRESTOR) 20 MG tablet TAKE 1 TABLET EVERY DAY FOR CHOLESTEROL (Patient taking differently: Take 20 mg by mouth at bedtime.) 90 tablet 0  ? traMADol (ULTRAM) 50 MG tablet Take 1-2 tablets (50-100 mg total) by mouth every 6 (six) hours as needed. 20 tablet 0  ? ?No current facility-administered medications on file prior to visit.  ? ? ?ROS ?Review of Systems   ?Constitutional: Negative.   ?HENT: Negative.    ?Eyes:  Negative for visual disturbance.  ?Respiratory:  Negative for shortness of breath.   ?Cardiovascular:  Negative for chest pain.  ?Gastrointestinal:  Positive for rectal pain. Negative for abdominal pain and constipation.  ?Musculoskeletal:  Negative for arthralgias.  ? ?Objective:  ?BP (!) 177/74   Pulse 72   Temp (!) 97.4 ?F (36.3 ?C)   Ht 4' 11"  (1.499 m)   Wt 172 lb 12.8 oz (78.4 kg)   LMP 03/02/2004   SpO2 97%   BMI 34.90 kg/m?  ? ?BP Readings from Last 3 Encounters:  ?12/06/21 (!) 177/74  ?11/30/21 134/62  ?11/10/21 (!) 123/52  ? ? ?Wt Readings from Last 3 Encounters:  ?12/06/21 172 lb 12.8 oz (78.4 kg)  ?11/30/21 170 lb 14.4 oz (77.5 kg)  ?11/10/21 170 lb (77.1 kg)  ? ? ? ?Physical Exam ?Constitutional:   ?   General: She is not in acute distress. ?   Appearance: She is well-developed.  ?Cardiovascular:  ?   Rate and Rhythm: Normal rate and regular rhythm.  ?Pulmonary:  ?   Breath sounds: Normal breath sounds.  ?Musculoskeletal:     ?   General: Normal range of motion.  ?  Skin: ?   General: Skin is warm and dry.  ?Neurological:  ?   Mental Status: She is alert and oriented to person, place, and time.  ? ? ?Diabetic Foot Exam - Simple   ?No data filed ?  ? ? ?A1c = 7.4 ? ?Assessment & Plan:  ? ?Delorise was seen today for medical management of chronic issues. ? ?Diagnoses and all orders for this visit: ? ?Other specified hypothyroidism ?-     TSH + free T4 ? ?Mixed diabetic hyperlipidemia associated with type 2 diabetes mellitus ([place]) ?-     Lipid panel ? ?Essential hypertension ?-     CBC with Differential/Platelet ?-     CMP14+EGFR ?-     olmesartan (BENICAR) 40 MG tablet; Take 1 tablet (40 mg total) by mouth daily. ? ?Controlled diabetes mellitus type 2 with complications, unspecified whether long term insulin use ([place]) ?-     Bayer DCA Hb A1c Waived ? ?Controlled type 2 diabetes mellitus with complication, with long-term current use of insulin  ([facility]) ?-     olmesartan (BENICAR) 40 MG tablet; Take 1 tablet (40 mg total) by mouth daily. ? ? ?I have changed Salomon Mast. Cromwell's olmesartan. I am also having her maintain her Vitamin D, acetaminophen, diclofenac sodium,

## 2021-12-07 LAB — CBC WITH DIFFERENTIAL/PLATELET
Basophils Absolute: 0.1 10*3/uL (ref 0.0–0.2)
Basos: 1 %
EOS (ABSOLUTE): 0.2 10*3/uL (ref 0.0–0.4)
Eos: 2 %
Hematocrit: 41 % (ref 34.0–46.6)
Hemoglobin: 13.4 g/dL (ref 11.1–15.9)
Immature Grans (Abs): 0.1 10*3/uL (ref 0.0–0.1)
Immature Granulocytes: 1 %
Lymphocytes Absolute: 1.8 10*3/uL (ref 0.7–3.1)
Lymphs: 27 %
MCH: 26.4 pg — ABNORMAL LOW (ref 26.6–33.0)
MCHC: 32.7 g/dL (ref 31.5–35.7)
MCV: 81 fL (ref 79–97)
Monocytes Absolute: 0.4 10*3/uL (ref 0.1–0.9)
Monocytes: 6 %
Neutrophils Absolute: 4.2 10*3/uL (ref 1.4–7.0)
Neutrophils: 63 %
Platelets: 235 10*3/uL (ref 150–450)
RBC: 5.08 x10E6/uL (ref 3.77–5.28)
RDW: 14 % (ref 11.7–15.4)
WBC: 6.7 10*3/uL (ref 3.4–10.8)

## 2021-12-07 LAB — CMP14+EGFR
ALT: 14 IU/L (ref 0–32)
AST: 16 IU/L (ref 0–40)
Albumin/Globulin Ratio: 2.5 — ABNORMAL HIGH (ref 1.2–2.2)
Albumin: 4.7 g/dL (ref 3.8–4.8)
Alkaline Phosphatase: 71 IU/L (ref 44–121)
BUN/Creatinine Ratio: 11 — ABNORMAL LOW (ref 12–28)
BUN: 7 mg/dL — ABNORMAL LOW (ref 8–27)
Bilirubin Total: 0.5 mg/dL (ref 0.0–1.2)
CO2: 25 mmol/L (ref 20–29)
Calcium: 8.1 mg/dL — ABNORMAL LOW (ref 8.7–10.3)
Chloride: 103 mmol/L (ref 96–106)
Creatinine, Ser: 0.63 mg/dL (ref 0.57–1.00)
Globulin, Total: 1.9 g/dL (ref 1.5–4.5)
Glucose: 89 mg/dL (ref 70–99)
Potassium: 4 mmol/L (ref 3.5–5.2)
Sodium: 142 mmol/L (ref 134–144)
Total Protein: 6.6 g/dL (ref 6.0–8.5)
eGFR: 97 mL/min/{1.73_m2} (ref >59)

## 2021-12-07 LAB — LIPID PANEL
Chol/HDL Ratio: 3 ratio (ref 0.0–4.4)
Cholesterol, Total: 127 mg/dL (ref 100–199)
HDL: 43 mg/dL (ref >39)
LDL Chol Calc (NIH): 44 mg/dL (ref 0–99)
Triglycerides: 256 mg/dL — ABNORMAL HIGH (ref 0–149)
VLDL Cholesterol Cal: 40 mg/dL (ref 5–40)

## 2021-12-07 LAB — TSH+FREE T4
Free T4: 1.6 ng/dL (ref 0.82–1.77)
TSH: 1.62 u[IU]/mL (ref 0.450–4.500)

## 2021-12-10 NOTE — Progress Notes (Signed)
Hello Morgan Schmidt,  Your lab result is normal and/or stable.Some minor variations that are not significant are commonly marked abnormal, but do not represent any medical problem for you.  Best regards, Sidharth Leverette, M.D.

## 2021-12-13 ENCOUNTER — Other Ambulatory Visit: Payer: Self-pay

## 2021-12-13 NOTE — Progress Notes (Signed)
The proposed treatment discussed in conference is for discussion purpose only and is not a binding recommendation.  The patients have not been physically examined, or presented with their treatment options.  Therefore, final treatment plans cannot be decided.  

## 2022-01-30 ENCOUNTER — Other Ambulatory Visit: Payer: Self-pay | Admitting: Family Medicine

## 2022-01-30 DIAGNOSIS — Z1231 Encounter for screening mammogram for malignant neoplasm of breast: Secondary | ICD-10-CM

## 2022-02-02 ENCOUNTER — Ambulatory Visit
Admission: RE | Admit: 2022-02-02 | Discharge: 2022-02-02 | Disposition: A | Discharge status: Home / Self Care | Payer: Medicare HMO | Source: Ambulatory Visit | Attending: Family Medicine | Admitting: Family Medicine

## 2022-02-02 DIAGNOSIS — Z1231 Encounter for screening mammogram for malignant neoplasm of breast: Secondary | ICD-10-CM

## 2022-02-07 ENCOUNTER — Other Ambulatory Visit: Payer: Self-pay | Admitting: Family Medicine

## 2022-02-07 DIAGNOSIS — R928 Other abnormal and inconclusive findings on diagnostic imaging of breast: Secondary | ICD-10-CM

## 2022-02-16 ENCOUNTER — Other Ambulatory Visit: Payer: Self-pay | Admitting: Family Medicine

## 2022-02-16 ENCOUNTER — Ambulatory Visit
Admission: RE | Admit: 2022-02-16 | Discharge: 2022-02-16 | Disposition: A | Discharge status: Home / Self Care | Payer: Medicare HMO | Source: Ambulatory Visit | Attending: Family Medicine | Admitting: Family Medicine

## 2022-02-16 DIAGNOSIS — N6320 Unspecified lump in the left breast, unspecified quadrant: Secondary | ICD-10-CM

## 2022-02-16 DIAGNOSIS — R928 Other abnormal and inconclusive findings on diagnostic imaging of breast: Secondary | ICD-10-CM

## 2022-02-21 ENCOUNTER — Ambulatory Visit
Admission: RE | Admit: 2022-02-21 | Discharge: 2022-02-21 | Disposition: A | Discharge status: Home / Self Care | Payer: Medicare HMO | Source: Ambulatory Visit | Attending: Family Medicine | Admitting: Family Medicine

## 2022-02-21 ENCOUNTER — Other Ambulatory Visit: Payer: Self-pay | Admitting: Family Medicine

## 2022-02-21 DIAGNOSIS — N6320 Unspecified lump in the left breast, unspecified quadrant: Secondary | ICD-10-CM

## 2022-02-21 DIAGNOSIS — C50812 Malignant neoplasm of overlapping sites of left female breast: Secondary | ICD-10-CM | POA: Diagnosis not present

## 2022-02-21 DIAGNOSIS — N6325 Unspecified lump in the left breast, overlapping quadrants: Secondary | ICD-10-CM | POA: Diagnosis not present

## 2022-02-21 DIAGNOSIS — Z170 Estrogen receptor positive status [ER+]: Secondary | ICD-10-CM | POA: Diagnosis not present

## 2022-02-23 ENCOUNTER — Telehealth: Payer: Self-pay | Admitting: Hematology and Oncology

## 2022-02-23 NOTE — Telephone Encounter (Signed)
Spoke to patient to confirm afternoon clinic appointment for 7/12 paperwork sent via mail

## 2022-02-26 ENCOUNTER — Encounter: Payer: Self-pay | Admitting: *Deleted

## 2022-02-26 DIAGNOSIS — Z170 Estrogen receptor positive status [ER+]: Secondary | ICD-10-CM

## 2022-02-27 NOTE — Progress Notes (Signed)
Radiation Oncology         [phone] ________________________________  Multidisciplinary Breast Oncology Clinic [facility]) Initial Outpatient Consultation  Name: Morgan Schmidt MRN: [id number]  Date: 02/28/2022  DOB: [birth year]  FF:MBWGYK, Cletus Gash, MD  Jovita Kussmaul, MD   REFERRING PHYSICIAN: [name], MD  DIAGNOSIS: There were no encounter diagnoses.  Stage *** Left Breast UOQ, Invasive and in-situ ductal carcinoma, ER+ / PR+ / Her2***, Grade 2  No diagnosis found.  HISTORY OF PRESENT ILLNESS::Morgan Schmidt is a 71 y.o. female who is presenting to the office today for evaluation of her newly diagnosed breast cancer. She is accompanied by ***. She is doing well overall.   She had routine screening mammography on 02/02/22 showing a possible abnormality in the left breast. She underwent unilateral left breast diagnostic mammography with tomography and left breast ultrasonography at [facility] on 02/16/22 showing: a suspicious irregular hypoechoic left breast mass in the 3 o'clock position, 4 cmfn, measuring 6 x 7 x 6 mm. No left axillary adenopathy was appreciated.   Biopsy of the 3 o'clock left breast mass on 02/21/22 showed: grade 2 invasive ductal carcinoma measuring 0.4 cm in the greatest linear extent with DCIS. Prognostic indicators significant for: estrogen receptor, 95% positive and progesterone receptor, 90% positive, both with strong staining intensity. Proliferation marker Ki67 at 3%. HER2 {positive or negative}.  Menarche: *** years old Age at first live birth: *** years old GP: *** LMP: *** Contraceptive: *** HRT: ***   The patient was referred today for presentation in the multidisciplinary conference.  Radiology studies and pathology slides were presented there for review and discussion of treatment options.  A consensus was discussed regarding potential next steps.  PREVIOUS RADIATION THERAPY: {EXAM; YES/NO:19492::"No"}  PAST MEDICAL HISTORY:  Past  Medical History:  Diagnosis Date   AIN (anal intraepithelial neoplasia) anal canal    History of adenomatous polyp of colon    History of thyroid cancer 06/2008   s/p  total bilateral thyroidectomy for symptomatic multinodular goiter;   06-30-2008 s/p total thyroidectomy -- papillary thyroid cancer  and treated w/ RAI post surgery 09-29-2008   (last scan negative for recurrence in epic 03/ 2011)   Hyperlipidemia    Hypertension    Hypothyroidism, postsurgical 06/2008   and had RAI post surgery 02/ 2010;   followed by pcp   OA (osteoarthritis)    Type 2 diabetes mellitus treated with insulin ([place])    followed by pcp   (11-23-2021  per pt checks daily in am and at times in afternoon,  fasting sugar--- 95--110)   Wears dentures    upper    PAST SURGICAL HISTORY: Past Surgical History:  Procedure Laterality Date   ANAL INTRAEPITHELIAL NEOPLASIA EXCISION N/A 11/30/2021   Procedure: EXCISIONAL BIOPSY ANAL LESIONS;  Surgeon: Leighton Ruff, MD;  Location: [place];  Service: General;  Laterality: N/A;   CESAREAN SECTION  1985   W/  BILATERAL TUBAL LIGATION   COLONOSCOPY WITH PROPOFOL  11/10/2021   by Dr.Pyrtle   HIGH RESOLUTION ANOSCOPY N/A 11/30/2021   Procedure: HIGH RESOLUTION ANOSCOPY;  Surgeon: Leighton Ruff, MD;  Location: [facility];  Service: General;  Laterality: N/A;   HIP PINNING,CANNULATED Left 03/10/2018   Procedure: CANNULATED HIP PINNING;  Surgeon: Paralee Cancel, MD;  Location: [place];  Service: Orthopedics;  Laterality: Left;   KNEE ARTHROSCOPY Left 10/22/2016   Procedure: ARTHROSCOPY LEFT KNEE WITH DEBRIDEMENT;  Surgeon: Paralee Cancel, MD;  Location: [place];  Service: Orthopedics;  Laterality: Left;   KNEE ARTHROSCOPY WITH MEDIAL MENISECTOMY  10/22/2016   Procedure: KNEE ARTHROSCOPY WITH MEDIAL MENISECTOMY;  Surgeon: Paralee Cancel, MD;  Location: [facility];  Service: Orthopedics;;   KNEE ARTHROSCOPY WITH  SUBCHONDROPLASTY Left 10/22/2016   Procedure: KNEE ARTHROSCOPY WITH MEDIAL CHONDROPLASTY;  Surgeon: Paralee Cancel, MD;  Location: [facility];  Service: Orthopedics;  Laterality: Left;   TOTAL THYROIDECTOMY Bilateral 06/30/2008   _0  by dr Excell Seltzer    FAMILY HISTORY:  Family History  Problem Relation Age of Onset   Heart disease Mother    Arthritis Father    Colon cancer Neg Hx    Esophageal cancer Neg Hx    Rectal cancer Neg Hx    Stomach cancer Neg Hx    Breast cancer Neg Hx    Colon polyps Neg Hx     SOCIAL HISTORY:  Social History   Socioeconomic History   Marital status: Significant Other    Spouse name: Not on file   Number of children: 3   Years of education: 12   Highest education level: High school graduate  Occupational History   Occupation: retired  Tobacco Use   Smoking status: Former    Packs/day: 0.50    Years: 5.00    Total pack years: 2.50    Types: Cigarettes    Quit date: 2002    Years since quitting: 21.5   Smokeless tobacco: Never  Vaping Use   Vaping Use: Never used  Substance and Sexual Activity   Alcohol use: No   Drug use: Never   Sexual activity: Not on file  Other Topics Concern   Not on file  Social History Narrative   Not on file   Social Determinants of Health   Financial Resource Strain: Low Risk  (09/29/2021)   Overall Financial Resource Strain (CARDIA)    Difficulty of Paying Living Expenses: Not hard at all  Food Insecurity: No Food Insecurity (09/29/2021)   Hunger Vital Sign    Worried About Running Out of Food in the Last Year: Never true    [place] in the Last Year: Never true  Transportation Needs: No Transportation Needs (09/29/2021)   PRAPARE - Hydrologist (Medical): No    Lack of Transportation (Non-Medical): No  Physical Activity: Insufficiently Active (09/29/2021)   Exercise Vital Sign    Days of Exercise per Week: 5 days    Minutes of Exercise per Session: 20 min   Stress: No Stress Concern Present (09/29/2021)   [employer]    Feeling of Stress : Not at all  Social Connections: [place] (09/29/2021)   Social Connection and Isolation Panel [NHANES]    Frequency of Communication with Friends and Family: More than three times a week    Frequency of Social Gatherings with Friends and Family: More than three times a week    Attends Religious Services: More than 4 times per year    Active Member of [employer] or Organizations: Yes    Attends Music therapist: More than 4 times per year    Marital Status: Living with partner    ALLERGIES:  Allergies  Allergen Reactions   Lipitor [Atorvastatin Calcium] Swelling    Throat swelling   Ace Inhibitors Cough    MEDICATIONS:  Current Outpatient Medications  Medication Sig Dispense Refill   Accu-Chek FastClix  Lancets MISC Use to check Blood Sugars twice daily. Dx E11.8 204 each 3   acetaminophen (TYLENOL) 325 MG tablet Take 325-650 mg by mouth every 6 (six) hours as needed (for pain or headaches).     Alcohol Swabs (B-D SINGLE USE SWABS REGULAR) PADS Use to check Blood Sugars twice daily. Dx E11.8 200 each 3   Blood Glucose Calibration (ACCU-CHEK AVIVA) SOLN Use to check Blood Sugars twice daily. Dx E11.8 1 each 3   Blood Glucose Monitoring Suppl (ACCU-CHEK AVIVA PLUS) w/Device KIT Use to check Blood Sugars twice daily. Dx E11.8 1 kit 0   Cholecalciferol (VITAMIN D) 2000 UNITS tablet Take 1 tablet (2,000 Units total) by mouth daily. (Patient taking differently: Take 2,000 Units by mouth every evening.) 30 tablet 11   diclofenac sodium (VOLTAREN) 1 % GEL Apply 2-4 g topically See admin instructions. Apply 2-4 grams to each hand up to 4 times a day as needed for arthritis in the fingers (Patient taking differently: Apply 2-4 g topically See admin instructions. Apply 2-4 grams to each hand up to 4 times a day as needed for arthritis  in the fingers) 100 g 11   docusate sodium (COLACE) 100 MG capsule Take 1 capsule (100 mg total) by mouth 2 (two) times daily. 180 capsule 3   Dulaglutide (TRULICITY) 4.5 GG/2.6RS SOPN Inject 4.5 mg as directed once a week. (Patient taking differently: Inject 4.5 mg as directed once a week. Monday's) 6.5 mL 3   glucose blood (ACCU-CHEK AVIVA PLUS) test strip TEST BLOOD SUGAR TWICE DAILY Dx E11.8 200 strip 3   insulin glargine (LANTUS SOLOSTAR) 100 UNIT/ML Solostar Pen Inject 110 Units into the skin daily after breakfast. 105 mL 3   Insulin Pen Needle (DROPLET PEN NEEDLES) 31G X 8 MM MISC Use as direceted daily Dx E11.8 100 each 3   levothyroxine (SYNTHROID) 112 MCG tablet Take 1 tablet (112 mcg total) by mouth every other day. Alternate with 125 (Patient taking differently: Take 112 mcg by mouth every other day. Alternate with 125 Takes in am) 45 tablet 3   levothyroxine (SYNTHROID) 125 MCG tablet Take 1 tablet (125 mcg total) by mouth every other day. Alternate with 112 (Patient taking differently: Take 125 mcg by mouth every other day. Alternate with 112 Takes in am) 45 tablet 3   metFORMIN (GLUCOPHAGE) 1000 MG tablet Take 1 tablet (1,000 mg total) by mouth 2 (two) times daily with a meal. (Patient taking differently: Take 1,000 mg by mouth 2 (two) times daily with a meal.) 180 tablet 3   olmesartan (BENICAR) 40 MG tablet Take 1 tablet (40 mg total) by mouth daily. 30 tablet 0   rosuvastatin (CRESTOR) 20 MG tablet TAKE 1 TABLET EVERY DAY FOR CHOLESTEROL (Patient taking differently: Take 20 mg by mouth at bedtime.) 90 tablet 0   traMADol (ULTRAM) 50 MG tablet Take 1-2 tablets (50-100 mg total) by mouth every 6 (six) hours as needed. 20 tablet 0   No current facility-administered medications for this encounter.    REVIEW OF SYSTEMS: A 10+ POINT REVIEW OF SYSTEMS WAS OBTAINED including neurology, dermatology, psychiatry, cardiac, respiratory, lymph, extremities, GI, GU, musculoskeletal,  constitutional, reproductive, HEENT. On the provided form, she reports ***. She denies *** and any other symptoms.    PHYSICAL EXAM:  vitals were not taken for this visit.  {may need to copy over vitals} Lungs are clear to auscultation bilaterally. Heart has regular rate and rhythm. No palpable cervical, supraclavicular, or axillary adenopathy. Abdomen  soft, non-tender, normal bowel sounds. Breast: *** breast with no palpable mass, nipple discharge, or bleeding. *** breast with ***.   KPS = ***  100 - Normal; no complaints; no evidence of disease. 90   - Able to carry on normal activity; minor signs or symptoms of disease. 80   - Normal activity with effort; some signs or symptoms of disease. 84   - Cares for self; unable to carry on normal activity or to do active work. 60   - Requires occasional assistance, but is able to care for most of his personal needs. 50   - Requires considerable assistance and frequent medical care. 8   - Disabled; requires special care and assistance. 46   - Severely disabled; hospital admission is indicated although death not imminent. 24   - Very sick; hospital admission necessary; active supportive treatment necessary. 10   - Moribund; fatal processes progressing rapidly. 0     - Dead  Karnofsky DA, Abelmann Ruch, Craver LS and [facility] [phone]) The use of the nitrogen mustards in the palliative treatment of carcinoma: with particular reference to bronchogenic carcinoma Cancer 1 634-56  LABORATORY DATA:  Lab Results  Component Value Date   WBC 6.7 12/06/2021   HGB 13.4 12/06/2021   HCT 41.0 12/06/2021   MCV 81 12/06/2021   PLT 235 12/06/2021   Lab Results  Component Value Date   NA 142 12/06/2021   K 4.0 12/06/2021   CL 103 12/06/2021   CO2 25 12/06/2021   Lab Results  Component Value Date   ALT 14 12/06/2021   AST 16 12/06/2021   ALKPHOS 71 12/06/2021   BILITOT 0.5 12/06/2021    PULMONARY FUNCTION TEST:   Review Flowsheet        No  data to display          RADIOGRAPHY: [place] LT BREAST BX W LOC DEV 1ST LESION IMG BX SPEC US GUIDE  Addendum Date: 02/23/2022   ADDENDUM REPORT: 02/23/2022 14:58 ADDENDUM: Pathology revealed Breast, LEFT, needle core biopsy, 3 o'clock (ribbon clip) - GRADE 2 INVASIVE DUCTAL CARCINOMA, DUCTAL CARCINOMA IN SITU. This was found to be concordant by Dr. Claudie Revering. Pathology results were discussed with the patient by telephone. The patient reported doing well after the biopsy with tenderness at the site. Post biopsy instructions and care were reviewed and questions were answered. The patient was encouraged to call [facility] for any additional concerns. The patient was referred to [facility] at [facility] on February 28, 2022. Pathology results reported by Stacie Acres RN on 02/22/2022. Electronically Signed   By: Claudie Revering M.D.   On: 02/23/2022 14:58   Result Date: 02/23/2022 CLINICAL DATA:  7 mm mass suspicious for malignancy in the 3 o'clock position of the left breast at recent mammography and ultrasound. EXAM: ULTRASOUND GUIDED LEFT BREAST CORE NEEDLE BIOPSY COMPARISON:  Previous examinations. PROCEDURE: I met with the patient and we discussed the procedure of ultrasound-guided biopsy, including benefits and alternatives. We discussed the high likelihood of a successful procedure. We discussed the risks of the procedure, including infection, bleeding, tissue injury, clip migration, and inadequate sampling. Informed written consent was given. The usual time-out protocol was performed immediately prior to the procedure. Preliminary ultrasound today, the mass was located in the 3 o'clock position of the left breast, 6 cm from the nipple. Using sterile technique and 1% Lidocaine as local anesthetic, under  direct ultrasound visualization, a 12 gauge spring-loaded device was used to perform biopsy of the 7 mm mass in the 3  o'clock position of the left breast, 6 cm from the nipple, using a caudal approach. At the conclusion of the procedure a ribbon shaped tissue marker clip was deployed into the biopsy cavity. Follow up 2 view mammogram was performed and dictated separately. IMPRESSION: Ultrasound guided biopsy of the recently demonstrated 7 mm mass in the 3 o'clock position of the left breast. No apparent complications. Electronically Signed: By: Claudie Revering M.D. On: 02/21/2022 13:44  MM CLIP PLACEMENT LEFT  Result Date: 02/21/2022 CLINICAL DATA:  Status post ultrasound-guided core needle biopsy of a 7 mm mass in the 3 o'clock position of the left breast. EXAM: 3D DIAGNOSTIC LEFT MAMMOGRAM POST ULTRASOUND BIOPSY COMPARISON:  Previous exam(s). FINDINGS: 3D Mammographic images were obtained following ultrasound guided biopsy of the recently demonstrated 7 mm mass in the 3 o'clock position of the left breast. The biopsy marking clip is in expected position at the site of biopsy. IMPRESSION: Appropriate positioning of the ribbon shaped biopsy marking clip at the site of biopsy in the 3 o'clock position of the left breast. Final Assessment: Post Procedure Mammograms for Marker Placement Electronically Signed   By: Claudie Revering M.D.   On: 02/21/2022 14:11  MM DIAG BREAST TOMO UNI LEFT  Result Date: 02/16/2022 CLINICAL DATA:  Patient recalled from screening for left breast asymmetry. EXAM: DIGITAL DIAGNOSTIC UNILATERAL LEFT MAMMOGRAM WITH TOMOSYNTHESIS AND CAD; ULTRASOUND LEFT BREAST LIMITED TECHNIQUE: Left digital diagnostic mammography and breast tomosynthesis was performed. The images were evaluated with computer-aided detection.; Targeted ultrasound examination of the left breast was performed. COMPARISON:  Previous exam(s). ACR Breast Density Category c: The breast tissue is heterogeneously dense, which may obscure small masses. FINDINGS: There is a persistent irregular asymmetry within the upper-outer left breast. Targeted  ultrasound is performed, showing a 6 x 7 x 6 mm irregular hypoechoic mass left breast 3 o'clock position 4 cm from the nipple. No left axillary adenopathy. IMPRESSION: Suspicious left breast mass. RECOMMENDATION: Ultrasound-guided core needle biopsy suspicious left breast mass. I have discussed the findings and recommendations with the patient. If applicable, a reminder letter will be sent to the patient regarding the next appointment. BI-RADS CATEGORY  4: Suspicious. Electronically Signed   By: Lovey Newcomer M.D.   On: 02/16/2022 15:38  US BREAST LTD UNI LEFT INC AXILLA  Result Date: 02/16/2022 CLINICAL DATA:  Patient recalled from screening for left breast asymmetry. EXAM: DIGITAL DIAGNOSTIC UNILATERAL LEFT MAMMOGRAM WITH TOMOSYNTHESIS AND CAD; ULTRASOUND LEFT BREAST LIMITED TECHNIQUE: Left digital diagnostic mammography and breast tomosynthesis was performed. The images were evaluated with computer-aided detection.; Targeted ultrasound examination of the left breast was performed. COMPARISON:  Previous exam(s). ACR Breast Density Category c: The breast tissue is heterogeneously dense, which may obscure small masses. FINDINGS: There is a persistent irregular asymmetry within the upper-outer left breast. Targeted ultrasound is performed, showing a 6 x 7 x 6 mm irregular hypoechoic mass left breast 3 o'clock position 4 cm from the nipple. No left axillary adenopathy. IMPRESSION: Suspicious left breast mass. RECOMMENDATION: Ultrasound-guided core needle biopsy suspicious left breast mass. I have discussed the findings and recommendations with the patient. If applicable, a reminder letter will be sent to the patient regarding the next appointment. BI-RADS CATEGORY  4: Suspicious. Electronically Signed   By: Lovey Newcomer M.D.   On: 02/16/2022 15:38  MM 3D SCREEN BREAST BILATERAL  Result Date:  02/05/2022 CLINICAL DATA:  Screening. EXAM: DIGITAL SCREENING BILATERAL MAMMOGRAM WITH TOMOSYNTHESIS AND CAD TECHNIQUE:  Bilateral screening digital craniocaudal and mediolateral oblique mammograms were obtained. Bilateral screening digital breast tomosynthesis was performed. The images were evaluated with computer-aided detection. COMPARISON:  Previous exam(s). ACR Breast Density Category b: There are scattered areas of fibroglandular density. FINDINGS: In the left breast, a possible small asymmetry in the posterior outer breast in both projections warrants further evaluation. In the right breast, no findings suspicious for malignancy. IMPRESSION: Further evaluation is suggested for possible asymmetry in the left breast. RECOMMENDATION: Diagnostic mammogram and possibly ultrasound of the left breast. (Code:[id number]-L-[id number]) The patient will be contacted regarding the findings, and additional imaging will be scheduled. BI-RADS CATEGORY  0: Incomplete. Need additional imaging evaluation and/or prior mammograms for comparison. Electronically Signed   By: Claudie Revering M.D.   On: 02/05/2022 16:18      IMPRESSION: ***   Patient will be a good candidate for breast conservation with radiotherapy to the left breast. We discussed the general course of radiation, potential side effects, and toxicities with radiation and the patient is interested in this approach. ***   PLAN:  ***   ------------------------------------------------  Blair Promise, PhD, MD  This document serves as a record of services personally performed by Gery Pray, MD. It was created on his behalf by Roney Mans, a trained medical scribe. The creation of this record is based on the scribe's personal observations and the provider's statements to them. This document has been checked and approved by the attending provider.

## 2022-02-28 ENCOUNTER — Encounter: Payer: Self-pay | Admitting: *Deleted

## 2022-02-28 ENCOUNTER — Ambulatory Visit: Payer: Self-pay | Admitting: General Surgery

## 2022-02-28 ENCOUNTER — Other Ambulatory Visit: Payer: Self-pay

## 2022-02-28 ENCOUNTER — Inpatient Hospital Stay: Payer: Medicare HMO | Attending: Hematology and Oncology | Admitting: Hematology and Oncology

## 2022-02-28 ENCOUNTER — Encounter: Payer: Self-pay | Admitting: General Practice

## 2022-02-28 ENCOUNTER — Other Ambulatory Visit: Payer: Self-pay | Admitting: *Deleted

## 2022-02-28 ENCOUNTER — Ambulatory Visit
Admission: RE | Admit: 2022-02-28 | Discharge: 2022-02-28 | Disposition: A | Discharge status: Home / Self Care | Payer: Medicare HMO | Source: Ambulatory Visit | Attending: Radiation Oncology | Admitting: Radiation Oncology

## 2022-02-28 ENCOUNTER — Inpatient Hospital Stay: Payer: Medicare HMO | Admitting: Genetic Counselor

## 2022-02-28 ENCOUNTER — Inpatient Hospital Stay: Payer: Medicare HMO

## 2022-02-28 DIAGNOSIS — C50412 Malignant neoplasm of upper-outer quadrant of left female breast: Secondary | ICD-10-CM

## 2022-02-28 DIAGNOSIS — Z170 Estrogen receptor positive status [ER+]: Secondary | ICD-10-CM | POA: Insufficient documentation

## 2022-02-28 DIAGNOSIS — Z853 Personal history of malignant neoplasm of breast: Secondary | ICD-10-CM | POA: Diagnosis not present

## 2022-02-28 DIAGNOSIS — Z87891 Personal history of nicotine dependence: Secondary | ICD-10-CM | POA: Insufficient documentation

## 2022-02-28 LAB — CBC WITH DIFFERENTIAL (CANCER CENTER ONLY)
Abs Immature Granulocytes: 0.02 10*3/uL (ref 0.00–0.07)
Basophils Absolute: 0.1 10*3/uL (ref 0.0–0.1)
Basophils Relative: 1 %
Eosinophils Absolute: 0.1 10*3/uL (ref 0.0–0.5)
Eosinophils Relative: 1 %
HCT: 39 % (ref 36.0–46.0)
Hemoglobin: 13.3 g/dL (ref 12.0–15.0)
Immature Granulocytes: 0 %
Lymphocytes Relative: 25 %
Lymphs Abs: 2 10*3/uL (ref 0.7–4.0)
MCH: 27.3 pg (ref 26.0–34.0)
MCHC: 34.1 g/dL (ref 30.0–36.0)
MCV: 79.9 fL — ABNORMAL LOW (ref 80.0–100.0)
Monocytes Absolute: 0.4 10*3/uL (ref 0.1–1.0)
Monocytes Relative: 5 %
Neutro Abs: 5.3 10*3/uL (ref 1.7–7.7)
Neutrophils Relative %: 68 %
Platelet Count: 237 10*3/uL (ref 150–400)
RBC: 4.88 MIL/uL (ref 3.87–5.11)
RDW: 13.5 % (ref 11.5–15.5)
WBC Count: 7.9 10*3/uL (ref 4.0–10.5)
nRBC: 0 % (ref 0.0–0.2)

## 2022-02-28 LAB — CMP (CANCER CENTER ONLY)
ALT: 19 U/L (ref 0–44)
AST: 18 U/L (ref 15–41)
Albumin: 4.4 g/dL (ref 3.5–5.0)
Alkaline Phosphatase: 70 U/L (ref 38–126)
Anion gap: 7 (ref 5–15)
BUN: 11 mg/dL (ref 8–23)
CO2: 29 mmol/L (ref 22–32)
Calcium: 8.8 mg/dL — ABNORMAL LOW (ref 8.9–10.3)
Chloride: 102 mmol/L (ref 98–111)
Creatinine: 0.71 mg/dL (ref 0.44–1.00)
GFR, Estimated: >60 mL/min (ref >60)
Glucose, Bld: 190 mg/dL — ABNORMAL HIGH (ref 70–99)
Potassium: 3.9 mmol/L (ref 3.5–5.1)
Sodium: 138 mmol/L (ref 135–145)
Total Bilirubin: 0.8 mg/dL (ref 0.3–1.2)
Total Protein: 6.8 g/dL (ref 6.5–8.1)

## 2022-02-28 LAB — GENETIC SCREENING ORDER

## 2022-02-28 NOTE — Progress Notes (Addendum)
REFERRING PROVIDER: Nicholas Lose, MD  PRIMARY PROVIDER:  Claretta Fraise, MD  PRIMARY REASON FOR VISIT:  1. Malignant neoplasm of upper-outer quadrant of left breast in female, estrogen receptor positive ([place])     HISTORY OF PRESENT ILLNESS:   Ms. Dagher, a 71 y.o. female, was seen for a [place] cancer genetics consultation during the breast multidisciplinary clinic at the request of Dr. Lindi Adie due to a personal history of cancer.  Ms. Winfree presents to clinic today to discuss the possibility of a hereditary predisposition to cancer, to discuss genetic testing, and to further clarify her future cancer risks, as well as potential cancer risks for family members.   In July 2023, at the age of 64, Ms. Oleksy was diagnosed with invasive ductal carcinoma of the left breast. Ms. Daw also has a history of papillary thyroid cancer diagnosed at age 5 and anal squamous cell carcinoma diagnosed at age 66.  CANCER HISTORY:  Oncology History  Malignant neoplasm of upper-outer quadrant of left breast in female, estrogen receptor positive ([place])  02/21/2022 Initial Diagnosis   Screening mammogram detected left breast asymmetry mass detected on ultrasound at 3 o'clock position measuring 0.7 cm, axilla negative, biopsy revealed grade 2 IDC with DCIS ER 95%, PR 90%, HER2 equivocal by IHC, FISH negative, Ki-67 3%   02/28/2022 Cancer Staging   Staging form: Breast, AJCC 8th Edition - Clinical: Stage IA (cT1b, cN0, cM0, G2, ER+, PR+, HER2-) - Signed by Nicholas Lose, MD on 02/28/2022 Stage prefix: Initial diagnosis Histologic grading system: 3 grade system     RISK FACTORS:  Menarche was at age 78.  First live birth at age 43.  OCP use for approximately 2 years.  Ovaries intact: yes.  Uterus intact: yes.  Menopausal status: postmenopausal.  HRT use: 0 years. Colonoscopy: yes;  approximately 8 tubular adenomas to date . Mammogram within the last year: yes.  Past Medical History:  Diagnosis  Date   AIN (anal intraepithelial neoplasia) anal canal    History of adenomatous polyp of colon    History of thyroid cancer 06/2008   s/p  total bilateral thyroidectomy for symptomatic multinodular goiter;   06-30-2008 s/p total thyroidectomy -- papillary thyroid cancer  and treated w/ RAI post surgery 09-29-2008   (last scan negative for recurrence in epic 03/ 2011)   Hyperlipidemia    Hypertension    Hypothyroidism, postsurgical 06/2008   and had RAI post surgery 02/ 2010;   followed by pcp   OA (osteoarthritis)    Type 2 diabetes mellitus treated with insulin ([place])    followed by pcp   (11-23-2021  per pt checks daily in am and at times in afternoon,  fasting sugar--- 95--110)   Wears dentures    upper    Past Surgical History:  Procedure Laterality Date   ANAL INTRAEPITHELIAL NEOPLASIA EXCISION N/A 11/30/2021   Procedure: EXCISIONAL BIOPSY ANAL LESIONS;  Surgeon: Leighton Ruff, MD;  Location: [place];  Service: General;  Laterality: N/A;   CESAREAN SECTION  1985   W/  BILATERAL TUBAL LIGATION   COLONOSCOPY WITH PROPOFOL  11/10/2021   by Dr.Pyrtle   HIGH RESOLUTION ANOSCOPY N/A 11/30/2021   Procedure: HIGH RESOLUTION ANOSCOPY;  Surgeon: Leighton Ruff, MD;  Location: [facility];  Service: General;  Laterality: N/A;   HIP PINNING,CANNULATED Left 03/10/2018   Procedure: CANNULATED HIP PINNING;  Surgeon: Paralee Cancel, MD;  Location: [place];  Service: Orthopedics;  Laterality: Left;   KNEE ARTHROSCOPY  Left 10/22/2016   Procedure: ARTHROSCOPY LEFT KNEE WITH DEBRIDEMENT;  Surgeon: Paralee Cancel, MD;  Location: [facility];  Service: Orthopedics;  Laterality: Left;   KNEE ARTHROSCOPY WITH MEDIAL MENISECTOMY  10/22/2016   Procedure: KNEE ARTHROSCOPY WITH MEDIAL MENISECTOMY;  Surgeon: Paralee Cancel, MD;  Location: [facility];  Service: Orthopedics;;   KNEE ARTHROSCOPY WITH SUBCHONDROPLASTY Left 10/22/2016   Procedure: KNEE  ARTHROSCOPY WITH MEDIAL CHONDROPLASTY;  Surgeon: Paralee Cancel, MD;  Location: [facility];  Service: Orthopedics;  Laterality: Left;   TOTAL THYROIDECTOMY Bilateral 06/30/2008   _0  by dr Excell Seltzer    Social History   Socioeconomic History   Marital status: Significant Other    Spouse name: Not on file   Number of children: 3   Years of education: 12   Highest education level: High school graduate  Occupational History   Occupation: retired  Tobacco Use   Smoking status: Former    Packs/day: 0.50    Years: 5.00    Total pack years: 2.50    Types: Cigarettes    Quit date: 2002    Years since quitting: 21.5   Smokeless tobacco: Never  Vaping Use   Vaping Use: Never used  Substance and Sexual Activity   Alcohol use: No   Drug use: Never   Sexual activity: Not on file  Other Topics Concern   Not on file  Social History Narrative   Not on file   Social Determinants of Health   Financial Resource Strain: Low Risk  (09/29/2021)   Overall Financial Resource Strain (CARDIA)    Difficulty of Paying Living Expenses: Not hard at all  Food Insecurity: No Food Insecurity (09/29/2021)   Hunger Vital Sign    Worried About Running Out of Food in the Last Year: Never true    [place] in the Last Year: Never true  Transportation Needs: No Transportation Needs (09/29/2021)   PRAPARE - Hydrologist (Medical): No    Lack of Transportation (Non-Medical): No  Physical Activity: Insufficiently Active (09/29/2021)   Exercise Vital Sign    Days of Exercise per Week: 5 days    Minutes of Exercise per Session: 20 min  Stress: No Stress Concern Present (09/29/2021)   [place]    Feeling of Stress : Not at all  Social Connections: [place] (09/29/2021)   Social Connection and Isolation Panel [NHANES]    Frequency of Communication with Friends and Family: More than three  times a week    Frequency of Social Gatherings with Friends and Family: More than three times a week    Attends Religious Services: More than 4 times per year    Active Member of [employer] or Organizations: Yes    Attends Music therapist: More than 4 times per year    Marital Status: Living with partner     FAMILY HISTORY:  We obtained a detailed, 4-generation family history. Ms. Victoria reports no family history of cancer or hereditary cancer genetic testing. There is no reported Ashkenazi Jewish ancestry.      GENETIC COUNSELING ASSESSMENT: Ms. Paden is a 71 y.o. female with a personal history of cancer which is somewhat suggestive of a hereditary cancer syndrome and predisposition to cancer. We, therefore, discussed and recommended the following at today's visit.   DISCUSSION: We discussed that 5 - 10% of cancer is hereditary, with most cases of hereditary  breast cancer associated with mutations in BRCA1/2.  There are other genes that can be associated with hereditary breast cancer syndromes. Type of cancer risk and level of risk are gene-specific. We discussed that testing is beneficial for several reasons including knowing how to follow individuals after completing their treatment, identifying whether potential treatment options would be beneficial, and understanding if other family members could be at risk for cancer and allowing them to undergo genetic testing.   We reviewed the characteristics, features and inheritance patterns of hereditary cancer syndromes. We also discussed genetic testing, including the appropriate family members to test, the process of testing, insurance coverage and turn-around-time for results. We discussed the implications of a negative, positive and/or variant of uncertain significant result. In order to get genetic test results in a timely manner so that Ms. Fregeau can use these genetic test results for surgical decisions, we recommended Ms. Berggren  pursue genetic testing for the BRCAplus. Once complete, we recommend Ms. Tarkington pursue reflex genetic testing to a more comprehensive gene panel.   Ms. Birkhead elected to have [facility] CustomNext Panel. The CustomNext-Cancer+RNAinsight panel offered by [facility] includes sequencing and rearrangement analysis for the following 49 genes:  APC, ATM, AXIN2, BARD1, BMPR1A, BRCA1, BRCA2, BRIP1, CDH1, CDK4, CDKN2A, CHEK2, CTNNA1, DICER1, EPCAM, GREM1, HOXB13, KIT, MEN1, MLH1, MSH2, MSH3, MSH6, MUTYH, NBN, NF1, NTHL1, PALB2, PDGFRA, PMS2, POLD1, POLE, PRKAR1A, PTEN, RAD50, RAD51C, RAD51D, RET, SDHA, SDHB, SDHC, SDHD, SMAD4, SMARCA4, STK11, TP53, TSC1, TSC2, and VHL.  RNA data is routinely analyzed for use in variant interpretation for all genes.  Based on Ms. Botelho's personal and family history of cancer, she does not meet medical criteria for genetic testing. She may have an out of pocket cost. We discussed that if her out of pocket cost for testing is over $100, the laboratory should contact them to discuss self-pay prices, patient pay assistance programs, if applicable, and other billing options.   PLAN: After considering the risks, benefits, and limitations, Ms. Wernli provided informed consent to pursue genetic testing and the blood sample was sent to [facility] for analysis of the CustomNext Panel. Results should be available within approximately 1-2 weeks' time, at which point they will be disclosed by telephone to Ms. Schlick, as will any additional recommendations warranted by these results. Ms. Renne will receive a summary of her genetic counseling visit and a copy of her results once available. This information will also be available in Epic.   Ms. Beauchesne questions were answered to her satisfaction today. Our contact information was provided should additional questions or concerns arise. Thank you for the referral and allowing [place] to share in the care of your patient.   Lucille Passy,  MS, [facility] Genetic Counselor [email or web address] (P) [phone]  The patient was seen for a total of 20 minutes in face-to-face genetic counseling.  The patient brought her daughter, Erick Blinks.  Drs. Lindi Adie and/or Burr Medico were available to discuss this case as needed.  _______________________________________________________________________ For Office Staff:  Number of people involved in session: 2 Was an Intern/ student involved with case: no

## 2022-02-28 NOTE — Assessment & Plan Note (Signed)
02/21/2022:Screening mammogram detected left breast asymmetry mass detected on ultrasound at 3 o'clock position measuring 0.7 cm, axilla negative, biopsy revealed grade 2 IDC with DCIS ER 95%, PR 90%, HER2 equivocal by IHC, FISH negative, Ki-67 3%  Pathology and radiology counseling:Discussed with the patient, the details of pathology including the type of breast cancer,the clinical staging, the significance of ER, PR and HER-2/neu receptors and the implications for treatment. After reviewing the pathology in detail, we proceeded to discuss the different treatment options between surgery, radiation, chemotherapy, antiestrogen therapies.  Recommendations: 1. Breast conserving surgery followed by 2. +/- Oncotype DX testing to determine if chemotherapy would be of any benefit followed by 3. Adjuvant radiation therapy followed by 4. Adjuvant antiestrogen therapy  Oncotype counseling: I discussed Oncotype DX test. I explained to the patient that this is a 21 gene panel to evaluate patient tumors DNA to calculate recurrence score. This would help determine whether patient has high risk or low risk breast cancer. She understands that if her tumor was found to be high risk, she would benefit from systemic chemotherapy. If low risk, no need of chemotherapy.  Return to clinic after surgery to discuss final pathology report and then determine if Oncotype DX testing will need to be sent.

## 2022-02-28 NOTE — Progress Notes (Signed)
[facility] NOTE  Patient Care Team: Claretta Fraise, MD as PCP - General (Family Medicine) Jovita Kussmaul, MD as Consulting Physician (General Surgery) Nicholas Lose, MD as Consulting Physician (Hematology and Oncology) Gery Pray, MD as Consulting Physician (Radiation Oncology) Mauro Kaufmann, RN as Oncology Nurse Navigator Rockwell Germany, RN as Oncology Nurse Navigator  CHIEF COMPLAINTS/PURPOSE OF CONSULTATION:  Newly diagnosed breast cancer  HISTORY OF PRESENTING ILLNESS:  Morgan Schmidt 71 y.o. female is here because of recent diagnosis of left breast mass Screening mammogram on 02/16/2022 detected left breast asymmetry. Diagnostic mammogram on 02/16/22 showed Suspicious left breast mass. Biopsy on the date of 02/21/2022 showed grade 2 IDC with DCIS ER  ER 95% positive; PR 90% positive, Her2 status 2+; Grade 2 Proliferation Marker Ki67: 3%, HER2 negative She was presented in the multidisciplinary tumor board and she is here today accompanied by her family to discuss treatment plan.   I reviewed her records extensively and collaborated the history with the patient.  SUMMARY OF ONCOLOGIC HISTORY: Oncology History  Malignant neoplasm of upper-outer quadrant of left breast in female, estrogen receptor positive ([place])  02/21/2022 Initial Diagnosis   Screening mammogram detected left breast asymmetry mass detected on ultrasound at 3 o'clock position measuring 0.7 cm, axilla negative, biopsy revealed grade 2 IDC with DCIS ER 95%, PR 90%, HER2 equivocal by IHC, FISH negative, Ki-67 3%   02/28/2022 Cancer Staging   Staging form: Breast, AJCC 8th Edition - Clinical: Stage IA (cT1b, cN0, cM0, G2, ER+, PR+, HER2-) - Signed by Nicholas Lose, MD on 02/28/2022 Stage prefix: Initial diagnosis Histologic grading system: 3 grade system      MEDICAL HISTORY:  Past Medical History:  Diagnosis Date   AIN (anal intraepithelial neoplasia) anal canal    History of  adenomatous polyp of colon    History of thyroid cancer 06/2008   s/p  total bilateral thyroidectomy for symptomatic multinodular goiter;   06-30-2008 s/p total thyroidectomy -- papillary thyroid cancer  and treated w/ RAI post surgery 09-29-2008   (last scan negative for recurrence in epic 03/ 2011)   Hyperlipidemia    Hypertension    Hypothyroidism, postsurgical 06/2008   and had RAI post surgery 02/ 2010;   followed by pcp   OA (osteoarthritis)    Type 2 diabetes mellitus treated with insulin ([place])    followed by pcp   (11-23-2021  per pt checks daily in am and at times in afternoon,  fasting sugar--- 95--110)   Wears dentures    upper    SURGICAL HISTORY: Past Surgical History:  Procedure Laterality Date   ANAL INTRAEPITHELIAL NEOPLASIA EXCISION N/A 11/30/2021   Procedure: EXCISIONAL BIOPSY ANAL LESIONS;  Surgeon: Leighton Ruff, MD;  Location: [place];  Service: General;  Laterality: N/A;   [place]   W/  BILATERAL TUBAL LIGATION   COLONOSCOPY WITH PROPOFOL  11/10/2021   by Dr.Pyrtle   HIGH RESOLUTION ANOSCOPY N/A 11/30/2021   Procedure: HIGH RESOLUTION ANOSCOPY;  Surgeon: Leighton Ruff, MD;  Location: [facility];  Service: General;  Laterality: N/A;   HIP PINNING,CANNULATED Left 03/10/2018   Procedure: CANNULATED HIP PINNING;  Surgeon: Paralee Cancel, MD;  Location: [place];  Service: Orthopedics;  Laterality: Left;   KNEE ARTHROSCOPY Left 10/22/2016   Procedure: ARTHROSCOPY LEFT KNEE WITH DEBRIDEMENT;  Surgeon: Paralee Cancel, MD;  Location: [facility];  Service: Orthopedics;  Laterality: Left;   KNEE ARTHROSCOPY WITH  MEDIAL MENISECTOMY  10/22/2016   Procedure: KNEE ARTHROSCOPY WITH MEDIAL MENISECTOMY;  Surgeon: Paralee Cancel, MD;  Location: [facility];  Service: Orthopedics;;   KNEE ARTHROSCOPY WITH SUBCHONDROPLASTY Left 10/22/2016   Procedure: KNEE ARTHROSCOPY WITH MEDIAL CHONDROPLASTY;  Surgeon:  Paralee Cancel, MD;  Location: [place];  Service: Orthopedics;  Laterality: Left;   TOTAL THYROIDECTOMY Bilateral 06/30/2008   @[facility]  by dr Excell Seltzer    SOCIAL HISTORY: Social History   Socioeconomic History   Marital status: Significant Other    Spouse name: Not on file   Number of children: 3   Years of education: 12   Highest education level: High school graduate  Occupational History   Occupation: retired  Tobacco Use   Smoking status: Former    Packs/day: 0.50    Years: 5.00    Total pack years: 2.50    Types: Cigarettes    Quit date: 2002    Years since quitting: 21.5   Smokeless tobacco: Never  Vaping Use   Vaping Use: Never used  Substance and Sexual Activity   Alcohol use: No   Drug use: Never   Sexual activity: Not on file  Other Topics Concern   Not on file  Social History Narrative   Not on file   Social Determinants of Health   Financial Resource Strain: Low Risk  (09/29/2021)   Overall Financial Resource Strain (CARDIA)    Difficulty of Paying Living Expenses: Not hard at all  Food Insecurity: No Food Insecurity (09/29/2021)   Hunger Vital Sign    Worried About Running Out of Food in the Last Year: Never true    [place] in the Last Year: Never true  Transportation Needs: No Transportation Needs (09/29/2021)   PRAPARE - Hydrologist (Medical): No    Lack of Transportation (Non-Medical): No  Physical Activity: Insufficiently Active (09/29/2021)   Exercise Vital Sign    Days of Exercise per Week: 5 days    Minutes of Exercise per Session: 20 min  Stress: No Stress Concern Present (09/29/2021)   [place]    Feeling of Stress : Not at all  Social Connections: [place] (09/29/2021)   Social Connection and Isolation Panel [NHANES]    Frequency of Communication with Friends and Family: More than three times a week    Frequency of  Social Gatherings with Friends and Family: More than three times a week    Attends Religious Services: More than 4 times per year    Active Member of [employer] or Organizations: Yes    Attends Music therapist: More than 4 times per year    Marital Status: Living with partner  Intimate Partner Violence: Not At Risk (09/29/2021)   Humiliation, Afraid, Rape, and Kick questionnaire    Fear of Current or Ex-Partner: No    Emotionally Abused: No    Physically Abused: No    Sexually Abused: No    FAMILY HISTORY: Family History  Problem Relation Age of Onset   Heart disease Mother    Arthritis Father    Colon cancer Neg Hx    Esophageal cancer Neg Hx    Rectal cancer Neg Hx    Stomach cancer Neg Hx    Breast cancer Neg Hx    Colon polyps Neg Hx     ALLERGIES:  is allergic to lipitor [atorvastatin calcium] and ace inhibitors.  MEDICATIONS:  Current Outpatient Medications  Medication Sig Dispense Refill   Accu-Chek FastClix Lancets MISC Use to check Blood Sugars twice daily. Dx E11.8 204 each 3   acetaminophen (TYLENOL) 325 MG tablet Take 325-650 mg by mouth every 6 (six) hours as needed (for pain or headaches).     Alcohol Swabs (B-D SINGLE USE SWABS REGULAR) PADS Use to check Blood Sugars twice daily. Dx E11.8 200 each 3   Blood Glucose Calibration (ACCU-CHEK AVIVA) SOLN Use to check Blood Sugars twice daily. Dx E11.8 1 each 3   Blood Glucose Monitoring Suppl (ACCU-CHEK AVIVA PLUS) w/Device KIT Use to check Blood Sugars twice daily. Dx E11.8 1 kit 0   Cholecalciferol (VITAMIN D) 2000 UNITS tablet Take 1 tablet (2,000 Units total) by mouth daily. (Patient taking differently: Take 2,000 Units by mouth every evening.) 30 tablet 11   diclofenac sodium (VOLTAREN) 1 % GEL Apply 2-4 g topically See admin instructions. Apply 2-4 grams to each hand up to 4 times a day as needed for arthritis in the fingers (Patient taking differently: Apply 2-4 g topically See admin instructions. Apply  2-4 grams to each hand up to 4 times a day as needed for arthritis in the fingers) 100 g 11   docusate sodium (COLACE) 100 MG capsule Take 1 capsule (100 mg total) by mouth 2 (two) times daily. 180 capsule 3   Dulaglutide (TRULICITY) 4.5 HO/1.2YQ SOPN Inject 4.5 mg as directed once a week. (Patient taking differently: Inject 4.5 mg as directed once a week. Monday's) 6.5 mL 3   glucose blood (ACCU-CHEK AVIVA PLUS) test strip TEST BLOOD SUGAR TWICE DAILY Dx E11.8 200 strip 3   insulin glargine (LANTUS SOLOSTAR) 100 UNIT/ML Solostar Pen Inject 110 Units into the skin daily after breakfast. 105 mL 3   Insulin Pen Needle (DROPLET PEN NEEDLES) 31G X 8 MM MISC Use as direceted daily Dx E11.8 100 each 3   levothyroxine (SYNTHROID) 112 MCG tablet Take 1 tablet (112 mcg total) by mouth every other day. Alternate with 125 (Patient taking differently: Take 112 mcg by mouth every other day. Alternate with 125 Takes in am) 45 tablet 3   levothyroxine (SYNTHROID) 125 MCG tablet Take 1 tablet (125 mcg total) by mouth every other day. Alternate with 112 (Patient taking differently: Take 125 mcg by mouth every other day. Alternate with 112 Takes in am) 45 tablet 3   metFORMIN (GLUCOPHAGE) 1000 MG tablet Take 1 tablet (1,000 mg total) by mouth 2 (two) times daily with a meal. (Patient taking differently: Take 1,000 mg by mouth 2 (two) times daily with a meal.) 180 tablet 3   olmesartan (BENICAR) 40 MG tablet Take 1 tablet (40 mg total) by mouth daily. 30 tablet 0   rosuvastatin (CRESTOR) 20 MG tablet TAKE 1 TABLET EVERY DAY FOR CHOLESTEROL (Patient taking differently: Take 20 mg by mouth at bedtime.) 90 tablet 0   traMADol (ULTRAM) 50 MG tablet Take 1-2 tablets (50-100 mg total) by mouth every 6 (six) hours as needed. 20 tablet 0   No current facility-administered medications for this visit.    REVIEW OF SYSTEMS:   Constitutional: Denies fevers, chills or abnormal night sweats Breast:  Denies any palpable lumps or  discharge All other systems were reviewed with the patient and are negative.  PHYSICAL EXAMINATION: ECOG PERFORMANCE STATUS: 0 - Asymptomatic  Vitals:   02/28/22 1250  BP: (!) 149/76  Pulse: 88  Resp: 18  Temp: 98.2 F (36.8 C)  SpO2: 97%  Filed Weights   02/28/22 1250  Weight: 169 lb 14.4 oz (77.1 kg)     LABORATORY DATA:  I have reviewed the data as listed Lab Results  Component Value Date   WBC 7.9 02/28/2022   HGB 13.3 02/28/2022   HCT 39.0 02/28/2022   MCV 79.9 (L) 02/28/2022   PLT 237 02/28/2022   Lab Results  Component Value Date   NA 138 02/28/2022   K 3.9 02/28/2022   CL 102 02/28/2022   CO2 29 02/28/2022    RADIOGRAPHIC STUDIES: I have personally reviewed the radiological reports and agreed with the findings in the report.  ASSESSMENT AND PLAN:  Malignant neoplasm of upper-outer quadrant of left breast in female, estrogen receptor positive ([place]) 02/21/2022:Screening mammogram detected left breast asymmetry mass detected on ultrasound at 3 o'clock position measuring 0.7 cm, axilla negative, biopsy revealed grade 2 IDC with DCIS ER 95%, PR 90%, HER2 equivocal by IHC, FISH negative, Ki-67 3%  Pathology and radiology counseling:Discussed with the patient, the details of pathology including the type of breast cancer,the clinical staging, the significance of ER, PR and HER-2/neu receptors and the implications for treatment. After reviewing the pathology in detail, we proceeded to discuss the different treatment options between surgery, radiation, chemotherapy, antiestrogen therapies.  Recommendations: 1. Breast conserving surgery followed by 2. +/- Oncotype DX testing to determine if chemotherapy would be of any benefit followed by 3. Adjuvant radiation therapy followed by 4. Adjuvant antiestrogen therapy  Oncotype counseling: I discussed Oncotype DX test. I explained to the patient that this is a 21 gene panel to evaluate patient tumors DNA to calculate  recurrence score. This would help determine whether patient has high risk or low risk breast cancer. She understands that if her tumor was found to be high risk, she would benefit from systemic chemotherapy. If low risk, no need of chemotherapy.  Return to clinic after surgery to discuss final pathology report and then determine if Oncotype DX testing will need to be sent.    All questions were answered. The patient knows to call the clinic with any problems, questions or concerns.    Harriette Ohara, MD 02/28/22  I Gardiner Coins am scribing for Dr. Lindi Adie  I have reviewed the above documentation for accuracy and completeness, and I agree with the above.

## 2022-03-01 NOTE — Progress Notes (Signed)
[place] Psychosocial Distress Screening Spiritual Care  Met with Morgan Schmidt and her daughter in [facility] to introduce [place] team/resources, reviewing distress screen per protocol.  The patient scored a 0 on the Psychosocial Distress Thermometer which indicates minimal distress. Also assessed for distress and other psychosocial needs.    03/01/2022  ONCBCN DISTRESS SCREENING   Screening Type Initial Screening   Distress experienced in past week (1-10) 0   Referral to support programs Yes    [name] reports having very little distress. She is particularly interested in massage therapy provided via [facility].  Follow up needed: No. Per [name], no other needs or concerns at this time, but she is aware of ongoing [facility] programming and chaplain availability, should needs or interest arise.   [place], [place], [facility] Pager [phone] Voicemail [phone]

## 2022-03-05 ENCOUNTER — Other Ambulatory Visit: Payer: Self-pay | Admitting: General Surgery

## 2022-03-05 DIAGNOSIS — C50412 Malignant neoplasm of upper-outer quadrant of left female breast: Secondary | ICD-10-CM

## 2022-03-07 ENCOUNTER — Encounter: Payer: Self-pay | Admitting: Physical Therapy

## 2022-03-07 ENCOUNTER — Other Ambulatory Visit: Payer: Self-pay

## 2022-03-07 ENCOUNTER — Ambulatory Visit: Payer: Medicare HMO | Attending: Hematology and Oncology | Admitting: Physical Therapy

## 2022-03-07 DIAGNOSIS — Z170 Estrogen receptor positive status [ER+]: Secondary | ICD-10-CM | POA: Diagnosis not present

## 2022-03-07 DIAGNOSIS — R293 Abnormal posture: Secondary | ICD-10-CM | POA: Insufficient documentation

## 2022-03-07 DIAGNOSIS — C50412 Malignant neoplasm of upper-outer quadrant of left female breast: Secondary | ICD-10-CM | POA: Insufficient documentation

## 2022-03-07 NOTE — Therapy (Signed)
OUTPATIENT PHYSICAL THERAPY BREAST CANCER BASELINE EVALUATION   Patient Name: Morgan Schmidt MRN: [id number] DOB:[birth year], 71 y.o., female Today's Date: 03/07/2022   PT End of Session - 03/07/22 1548     Visit Number 1    Number of Visits 2    Date for PT Re-Evaluation 04/18/22    PT Start Time 84    PT Stop Time 1548    PT Time Calculation (min) 41 min    Activity Tolerance Patient tolerated treatment well    Behavior During Therapy [facility] for tasks assessed/performed             Past Medical History:  Diagnosis Date   AIN (anal intraepithelial neoplasia) anal canal    History of adenomatous polyp of colon    History of thyroid cancer 06/2008   s/p  total bilateral thyroidectomy for symptomatic multinodular goiter;   06-30-2008 s/p total thyroidectomy -- papillary thyroid cancer  and treated w/ RAI post surgery 09-29-2008   (last scan negative for recurrence in epic 03/ 2011)   Hyperlipidemia    Hypertension    Hypothyroidism, postsurgical 06/2008   and had RAI post surgery 02/ 2010;   followed by pcp   OA (osteoarthritis)    Type 2 diabetes mellitus treated with insulin ([place])    followed by pcp   (11-23-2021  per pt checks daily in am and at times in afternoon,  fasting sugar--- 95--110)   Wears dentures    upper   Past Surgical History:  Procedure Laterality Date   ANAL INTRAEPITHELIAL NEOPLASIA EXCISION N/A 11/30/2021   Procedure: EXCISIONAL BIOPSY ANAL LESIONS;  Surgeon: Leighton Ruff, MD;  Location: [facility];  Service: General;  Laterality: N/A;   [place]   W/  BILATERAL TUBAL LIGATION   COLONOSCOPY WITH PROPOFOL  11/10/2021   by Dr.Pyrtle   HIGH RESOLUTION ANOSCOPY N/A 11/30/2021   Procedure: HIGH RESOLUTION ANOSCOPY;  Surgeon: Leighton Ruff, MD;  Location: [facility];  Service: General;  Laterality: N/A;   HIP PINNING,CANNULATED Left 03/10/2018   Procedure: CANNULATED HIP PINNING;  Surgeon: Paralee Cancel,  MD;  Location: [facility];  Service: Orthopedics;  Laterality: Left;   KNEE ARTHROSCOPY Left 10/22/2016   Procedure: ARTHROSCOPY LEFT KNEE WITH DEBRIDEMENT;  Surgeon: Paralee Cancel, MD;  Location: [facility];  Service: Orthopedics;  Laterality: Left;   KNEE ARTHROSCOPY WITH MEDIAL MENISECTOMY  10/22/2016   Procedure: KNEE ARTHROSCOPY WITH MEDIAL MENISECTOMY;  Surgeon: Paralee Cancel, MD;  Location: [facility];  Service: Orthopedics;;   KNEE ARTHROSCOPY WITH SUBCHONDROPLASTY Left 10/22/2016   Procedure: KNEE ARTHROSCOPY WITH MEDIAL CHONDROPLASTY;  Surgeon: Paralee Cancel, MD;  [facility];  Service: Orthopedics;  Laterality: Left;   TOTAL THYROIDECTOMY Bilateral 06/30/2008   @[facility]  by dr Excell Seltzer   Patient Active Problem List   Diagnosis Date Noted   Malignant neoplasm of upper-outer quadrant of left breast in female, estrogen receptor positive ([place]) 02/26/2022   Pain in left knee 07/01/2018   Controlled diabetes mellitus type 2 with complications ([place]) 65/53/7482   Essential hypertension 03/23/2016   Other specified hypothyroidism 12/16/2014   Mixed diabetic hyperlipidemia associated with type 2 diabetes mellitus ([place]) 12/16/2014   Long term current use of insulin ([place]) 12/16/2014   Polyposis of colon 12/16/2014    PCP: Claretta Fraise, MD  REFERRING PROVIDER: Nicholas Lose, MD  REFERRING DIAG: C50.412,Z17.0 (ICD-10-CM) - Malignant neoplasm of upper-outer quadrant of left breast in female, estrogen  receptor positive ([place])   THERAPY DIAG:  Abnormal posture  Malignant neoplasm of upper-outer quadrant of left breast in female, estrogen receptor positive ([place])  Rationale for Evaluation and Treatment Rehabilitation  ONSET DATE: 02/21/22  SUBJECTIVE                                                                                                                                                                                           SUBJECTIVE  STATEMENT: Patient reports she is here today to be seen by her medical team for her newly diagnosed left breast cancer.   PERTINENT HISTORY:  Patient was diagnosed on 02/21/22 with left grade 2. It measures 0.7 cm and is located in the upper-outer quadrant. It is ER+, PR+, HER2- with a Ki67 of 3%. Hx of rectal cancer and surgery in 12/2021.   PATIENT GOALS   reduce lymphedema risk and learn post op HEP.   PAIN:  Are you having pain? No   PRECAUTIONS: Active CA   HAND DOMINANCE: right  WEIGHT BEARING RESTRICTIONS No  FALLS:  Has patient fallen in last 6 months? No  LIVING ENVIRONMENT: Patient lives with: boyfriend Lives in: House/apartment Has following equipment at home: Walker - 2 wheeled, Crutches, and bed side commode  OCCUPATION: pt is retired, pt was a Landscape architect: pt walks dogs 3x/day for about 20-30 min   PRIOR LEVEL OF FUNCTION: Independent   OBJECTIVE  COGNITION:  Overall cognitive status: Within functional limits for tasks assessed    POSTURE:  Forward head and rounded shoulders posture  UPPER EXTREMITY AROM/PROM:  A/PROM RIGHT   eval   Shoulder extension 75  Shoulder flexion 156  Shoulder abduction 160  Shoulder internal rotation 53  Shoulder external rotation 78    (Blank rows = not tested)  A/PROM LEFT   eval  Shoulder extension 72  Shoulder flexion 155  Shoulder abduction 179  Shoulder internal rotation 53  Shoulder external rotation 85    (Blank rows = not tested)   CERVICAL AROM: All within normal limits:    Percent limited  Flexion WFL  Extension WFL  Right lateral flexion WFL  Left lateral flexion WFL  Right rotation WFL  Left rotation WFL     UPPER EXTREMITY STRENGTH: 5/5   LYMPHEDEMA ASSESSMENTS:   LANDMARK RIGHT   eval  10 cm proximal to olecranon process 29  Olecranon process 25  10 cm proximal to ulnar styloid process 23  Just proximal to ulnar styloid process 16.4  Across hand at thumb web space 20.5   At base of 2nd digit 6.6  (Blank rows = not tested)  LANDMARK LEFT   eval  10 cm proximal to olecranon process 28.5  Olecranon process 24.5  10 cm proximal to ulnar styloid process 23  Just proximal to ulnar styloid process 16  Across hand at thumb web space 19  At base of 2nd digit 6.5  (Blank rows = not tested)   L-DEX LYMPHEDEMA SCREENING:  The patient was assessed using the L-Dex machine today to produce a lymphedema index baseline score. The patient will be reassessed on a regular basis (typically every 3 months) to obtain new L-Dex scores. If the score is > 6.5 points away from his/her baseline score indicating onset of subclinical lymphedema, it will be recommended to wear a compression garment for 4 weeks, 12 hours per day and then be reassessed. If the score continues to be > 6.5 points from baseline at reassessment, we will initiate lymphedema treatment. Assessing in this manner has a 95% rate of preventing clinically significant lymphedema.   L-DEX FLOWSHEETS - 03/07/22 1500       L-DEX LYMPHEDEMA SCREENING   Measurement Type Unilateral    L-DEX MEASUREMENT EXTREMITY Upper Extremity    POSITION  Standing    DOMINANT SIDE Right    At Risk Side Left    BASELINE SCORE (UNILATERAL) 0              QUICK DASH SURVEY:  Katina Dung - 03/07/22 0001     Open a tight or new jar Mild difficulty    Do heavy household chores (wash walls, wash floors) Mild difficulty    Carry a shopping bag or briefcase No difficulty    Wash your back No difficulty    Use a knife to cut food No difficulty    Recreational activities in which you take some force or impact through your arm, shoulder, or hand (golf, hammering, tennis) No difficulty    During the past week, to what extent has your arm, shoulder or hand problem interfered with your normal social activities with family, friends, neighbors, or groups? Not at all    During the past week, to what extent has your arm, shoulder or hand  problem limited your work or other regular daily activities Not at all    Arm, shoulder, or hand pain. None    Tingling (pins and needles) in your arm, shoulder, or hand None    Difficulty Sleeping No difficulty    DASH Score 4.55 %              PATIENT EDUCATION:  Education details: Lymphedema risk reduction and post op shoulder/posture HEP Person educated: Patient Education method: Explanation, Demonstration, Handout Education comprehension: Patient verbalized understanding and returned demonstration   HOME EXERCISE PROGRAM: Patient was instructed today in a home exercise program today for post op shoulder range of motion. These included active assist shoulder flexion in sitting, scapular retraction, wall walking with shoulder abduction, and hands behind head external rotation.  She was encouraged to do these twice a day, holding 3 seconds and repeating 5 times when permitted by her physician.   ASSESSMENT:  CLINICAL IMPRESSION: Pt reports to PT with recently diagnosed L breast cancer. She is planning to undergo a L lumpectomy and SLNB on 03/22/22. Her cervical and shoulder ROM are WFL bilaterally. She will benefit from a post op PT reassessment to determine needs and from L-Dex screens every 3 months for 2 years to detect subclinical lymphedema.  Pt will benefit from skilled therapeutic intervention to improve on the following deficits:  Decreased knowledge of precautions, impaired UE functional use, pain, decreased ROM, postural dysfunction.   PT treatment/interventions: ADL/self-care home management, pt/family education, therapeutic exercise  REHAB POTENTIAL: Excellent  CLINICAL DECISION MAKING: Stable/uncomplicated  EVALUATION COMPLEXITY: Low   GOALS: Goals reviewed with patient? YES  LONG TERM GOALS: (STG=LTG)    Name Target Date Goal status  1 Pt will be able to verbalize understanding of pertinent lymphedema risk reduction practices relevant to her dx specifically  related to skin care.  Baseline:  No knowledge 03/07/2022 Achieved at eval  2 Pt will be able to return demo and/or verbalize understanding of the post op HEP related to regaining shoulder ROM. Baseline:  No knowledge 03/07/2022 Achieved at eval  3 Pt will be able to verbalize understanding of the importance of attending the post op After Breast CA Class for further lymphedema risk reduction education and therapeutic exercise.  Baseline:  No knowledge 03/07/2022 Achieved at eval  4 Pt will demo she has regained full shoulder ROM and function post operatively compared to baselines.  Baseline: See objective measurements taken today. 04/18/22 INITIAL     PLAN: PT FREQUENCY/DURATION: EVAL and 1 follow up appointment.   PLAN FOR NEXT SESSION: will reassess 3-4 weeks post op to determine needs.   Patient will follow up at outpatient cancer rehab 3-4 weeks following surgery.  If the patient requires physical therapy at that time, a specific plan will be dictated and sent to the referring physician for approval. The patient was educated today on appropriate basic range of motion exercises to begin post operatively and the importance of attending the After Breast Cancer class following surgery.  Patient was educated today on lymphedema risk reduction practices as it pertains to recommendations that will benefit the patient immediately following surgery.  She verbalized good understanding.    Physical Therapy Information for After Breast Cancer Surgery/Treatment:  Lymphedema is a swelling condition that you may be at risk for in your arm if you have lymph nodes removed from the armpit area.  After a sentinel node biopsy, the risk is approximately 5-9% and is higher after an axillary node dissection.  There is treatment available for this condition and it is not life-threatening.  Contact your physician or physical therapist with concerns. You may begin the 4 shoulder/posture exercises (see additional sheet)  when permitted by your physician (typically a week after surgery).  If you have drains, you may need to wait until those are removed before beginning range of motion exercises.  A general recommendation is to not lift your arms above shoulder height until drains are removed.  These exercises should be done to your tolerance and gently.  This is not a "no pain/no gain" type of recovery so listen to your body and stretch into the range of motion that you can tolerate, stopping if you have pain.  If you are having immediate reconstruction, ask your plastic surgeon about doing exercises as he or she may want you to wait. We encourage you to attend the free one time ABC (After Breast Cancer) class offered by [facility].  You will learn information related to lymphedema risk, prevention and treatment and additional exercises to regain mobility following surgery.  You can call [phone] for more information.  This is offered the 1st and 3rd Monday of each month.  You only attend the class one time. While undergoing any medical procedure or treatment, try to avoid blood pressure being taken or needle sticks from occurring  on the arm on the side of cancer.   This recommendation begins after surgery and continues for the rest of your life.  This may help reduce your risk of getting lymphedema (swelling in your arm). An excellent resource for those seeking information on lymphedema is the National Lymphedema Network's web site. It can be accessed at [place].org If you notice swelling in your hand, arm or breast at any time following surgery (even if it is many years from now), please contact your doctor or physical therapist to discuss this.  Lymphedema can be treated at any time but it is easier for you if it is treated early on.  If you feel like your shoulder motion is not returning to normal in a reasonable amount of time, please contact your surgeon or physical therapist.  [facility] [phone]. [address], [address], [place] [address]  ABC CLASS After Breast Cancer Class  After Breast Cancer Class is a specially designed exercise class to assist you in a safe recover after having breast cancer surgery.  In this class you will learn how to get back to full function whether your drains were just removed or if you had surgery a month ago.  This one-time class is held the 1st and 3rd Monday of every month from 11:00 a.m. until 12:00 noon virtually.  This class is FREE and space is limited. For more information or to register for the next available class, call [phone].  Class Goals  Understand specific stretches to improve the flexibility of you chest and shoulder. Learn ways to safely strengthen your upper body and improve your posture. Understand the warning signs of infection and why you may be at risk for an arm infection. Learn about Lymphedema and prevention.  ** You do not attend this class until after surgery.  Drains must be removed to participate  Patient was instructed today in a home exercise program today for post op shoulder range of motion. These included active assist shoulder flexion in sitting, scapular retraction, wall walking with shoulder abduction, and hands behind head external rotation.  She was encouraged to do these twice a day, holding 3 seconds and repeating 5 times when permitted by her physician.    [facility], PT 03/07/2022, 3:50 PM

## 2022-03-08 ENCOUNTER — Encounter: Payer: Self-pay | Admitting: *Deleted

## 2022-03-08 ENCOUNTER — Telehealth: Payer: Self-pay | Admitting: Hematology and Oncology

## 2022-03-08 ENCOUNTER — Telehealth: Payer: Self-pay | Admitting: *Deleted

## 2022-03-08 NOTE — Telephone Encounter (Signed)
Spoke with patient to follow up from [facility] 7/12 and assess navigation needs. Patient denies any questions or concerns at this time. Encouraged her to call should anything arise. Patient verbalized understanding.

## 2022-03-08 NOTE — Telephone Encounter (Signed)
.  Called patient to schedule appointment per 7/20 inbasket patient is aware of date and time.

## 2022-03-09 ENCOUNTER — Telehealth: Payer: Self-pay | Admitting: Hematology and Oncology

## 2022-03-09 NOTE — Telephone Encounter (Signed)
.  Called patient to schedule appointment per 7/15 inbasket, patient is aware of date and time.

## 2022-03-14 ENCOUNTER — Other Ambulatory Visit: Payer: Self-pay | Admitting: Family Medicine

## 2022-03-15 ENCOUNTER — Encounter (HOSPITAL_BASED_OUTPATIENT_CLINIC_OR_DEPARTMENT_OTHER): Payer: Self-pay | Admitting: General Surgery

## 2022-03-15 ENCOUNTER — Other Ambulatory Visit: Payer: Self-pay

## 2022-03-16 DIAGNOSIS — H5213 Myopia, bilateral: Secondary | ICD-10-CM | POA: Diagnosis not present

## 2022-03-16 LAB — HM DIABETES EYE EXAM

## 2022-03-20 ENCOUNTER — Encounter: Payer: Self-pay | Admitting: Genetic Counselor

## 2022-03-20 ENCOUNTER — Telehealth: Payer: Self-pay | Admitting: Genetic Counselor

## 2022-03-20 DIAGNOSIS — Z1379 Encounter for other screening for genetic and chromosomal anomalies: Secondary | ICD-10-CM | POA: Insufficient documentation

## 2022-03-20 NOTE — Telephone Encounter (Signed)
I contacted Ms. Lennartz to discuss her genetic testing results. No pathogenic variants were identified in the 49 genes analyzed. Detailed clinic note to follow.  The test report has been scanned into EPIC and is located under the Molecular Pathology section of the Results Review tab.  A portion of the result report is included below for reference.   Lucille Passy, MS, [facility] Genetic Counselor [email or web address] (P) [phone]

## 2022-03-21 ENCOUNTER — Ambulatory Visit
Admission: RE | Admit: 2022-03-21 | Discharge: 2022-03-21 | Disposition: A | Discharge status: Home / Self Care | Payer: Medicare HMO | Source: Ambulatory Visit | Attending: General Surgery | Admitting: General Surgery

## 2022-03-21 ENCOUNTER — Ambulatory Visit: Payer: Medicare HMO | Admitting: Family Medicine

## 2022-03-21 DIAGNOSIS — C50912 Malignant neoplasm of unspecified site of left female breast: Secondary | ICD-10-CM | POA: Diagnosis not present

## 2022-03-21 DIAGNOSIS — Z170 Estrogen receptor positive status [ER+]: Secondary | ICD-10-CM

## 2022-03-21 NOTE — Progress Notes (Signed)
       Patient Instructions  The night before surgery:  No food after midnight. ONLY clear liquids after midnight  The day of surgery (if you do NOT have diabetes):  Drink ONE (1) Pre-Surgery Clear Ensure as directed.   This drink was given to you during your hospital  pre-op appointment visit. The pre-op nurse will instruct you on the time to drink the  Pre-Surgery Ensure depending on your surgery time. Finish the drink at the designated time by the pre-op nurse.  Nothing else to drink after completing the  Pre-Surgery Clear Ensure.  The day of surgery (if you have diabetes): Drink ONE (1) Gatorade 2 (G2) as directed. This drink was given to you during your hospital  pre-op appointment visit.  The pre-op nurse will instruct you on the time to drink the   Gatorade 2 (G2) depending on your surgery time. Color of the Gatorade may vary. Red is not allowed. Nothing else to drink after completing the  Gatorade 2 (G2).         If you have questions, please contact your surgeon's office. Surgical soap given to patient with instructions and patient verbalized understanding.  

## 2022-03-22 ENCOUNTER — Ambulatory Visit
Admission: RE | Admit: 2022-03-22 | Discharge: 2022-03-22 | Disposition: A | Discharge status: Home / Self Care | Payer: Medicare HMO | Source: Ambulatory Visit | Attending: General Surgery | Admitting: General Surgery

## 2022-03-22 ENCOUNTER — Ambulatory Visit (HOSPITAL_BASED_OUTPATIENT_CLINIC_OR_DEPARTMENT_OTHER): Payer: Medicare HMO | Admitting: Anesthesiology

## 2022-03-22 ENCOUNTER — Other Ambulatory Visit: Payer: Self-pay

## 2022-03-22 ENCOUNTER — Encounter (HOSPITAL_BASED_OUTPATIENT_CLINIC_OR_DEPARTMENT_OTHER)
Admission: RE | Disposition: A | Discharge status: Home / Self Care | Payer: Self-pay | Source: Home / Self Care | Attending: General Surgery

## 2022-03-22 ENCOUNTER — Encounter (HOSPITAL_BASED_OUTPATIENT_CLINIC_OR_DEPARTMENT_OTHER): Payer: Self-pay | Admitting: General Surgery

## 2022-03-22 ENCOUNTER — Ambulatory Visit (HOSPITAL_BASED_OUTPATIENT_CLINIC_OR_DEPARTMENT_OTHER)
Admission: RE | Admit: 2022-03-22 | Discharge: 2022-03-22 | Disposition: A | Discharge status: Home / Self Care | Payer: Medicare HMO | Attending: General Surgery | Admitting: General Surgery

## 2022-03-22 DIAGNOSIS — C50912 Malignant neoplasm of unspecified site of left female breast: Secondary | ICD-10-CM | POA: Diagnosis not present

## 2022-03-22 DIAGNOSIS — Z170 Estrogen receptor positive status [ER+]: Secondary | ICD-10-CM | POA: Insufficient documentation

## 2022-03-22 DIAGNOSIS — G8918 Other acute postprocedural pain: Secondary | ICD-10-CM | POA: Diagnosis not present

## 2022-03-22 DIAGNOSIS — E039 Hypothyroidism, unspecified: Secondary | ICD-10-CM | POA: Insufficient documentation

## 2022-03-22 DIAGNOSIS — E119 Type 2 diabetes mellitus without complications: Secondary | ICD-10-CM | POA: Diagnosis not present

## 2022-03-22 DIAGNOSIS — R599 Enlarged lymph nodes, unspecified: Secondary | ICD-10-CM | POA: Diagnosis not present

## 2022-03-22 DIAGNOSIS — I10 Essential (primary) hypertension: Secondary | ICD-10-CM | POA: Diagnosis not present

## 2022-03-22 DIAGNOSIS — Z87891 Personal history of nicotine dependence: Secondary | ICD-10-CM | POA: Diagnosis not present

## 2022-03-22 DIAGNOSIS — C50412 Malignant neoplasm of upper-outer quadrant of left female breast: Secondary | ICD-10-CM | POA: Diagnosis not present

## 2022-03-22 DIAGNOSIS — Z794 Long term (current) use of insulin: Secondary | ICD-10-CM | POA: Diagnosis not present

## 2022-03-22 DIAGNOSIS — R928 Other abnormal and inconclusive findings on diagnostic imaging of breast: Secondary | ICD-10-CM | POA: Diagnosis not present

## 2022-03-22 HISTORY — PX: BREAST LUMPECTOMY WITH RADIOACTIVE SEED AND SENTINEL LYMPH NODE BIOPSY: SHX6550

## 2022-03-22 LAB — GLUCOSE, CAPILLARY
Glucose-Capillary: 86 mg/dL (ref 70–99)
Glucose-Capillary: 83 mg/dL (ref 70–99)

## 2022-03-22 SURGERY — BREAST LUMPECTOMY WITH RADIOACTIVE SEED AND SENTINEL LYMPH NODE BIOPSY
Anesthesia: General | Site: Breast | Laterality: Left

## 2022-03-22 MED ORDER — FENTANYL CITRATE (PF) 100 MCG/2ML IJ SOLN: 25.0000 ug | INTRAMUSCULAR | Status: DC | PRN

## 2022-03-22 MED ORDER — ONDANSETRON HCL 4 MG/2ML IJ SOLN
INTRAMUSCULAR | Status: AC
  Filled 2022-03-22: qty 2

## 2022-03-22 MED ORDER — CHLORHEXIDINE GLUCONATE CLOTH 2 % EX PADS: 6.0000 | MEDICATED_PAD | Freq: Once | CUTANEOUS | Status: DC

## 2022-03-22 MED ORDER — DROPERIDOL 2.5 MG/ML IJ SOLN
INTRAMUSCULAR | Status: DC | PRN
  Administered 2022-03-22: .625 mg via INTRAVENOUS

## 2022-03-22 MED ORDER — GABAPENTIN 300 MG PO CAPS
300.0000 mg | ORAL_CAPSULE | ORAL | Status: AC
  Administered 2022-03-22: 300 mg via ORAL

## 2022-03-22 MED ORDER — FENTANYL CITRATE (PF) 100 MCG/2ML IJ SOLN
50.0000 ug | Freq: Once | INTRAMUSCULAR | Status: AC
  Administered 2022-03-22: 50 ug via INTRAVENOUS

## 2022-03-22 MED ORDER — MIDAZOLAM HCL 2 MG/2ML IJ SOLN
1.0000 mg | Freq: Once | INTRAMUSCULAR | Status: AC
  Administered 2022-03-22: 1 mg via INTRAVENOUS

## 2022-03-22 MED ORDER — OXYCODONE HCL 5 MG/5ML PO SOLN: 5.0000 mg | Freq: Once | ORAL | Status: DC | PRN

## 2022-03-22 MED ORDER — CELECOXIB 200 MG PO CAPS
200.0000 mg | ORAL_CAPSULE | ORAL | Status: AC
  Administered 2022-03-22: 200 mg via ORAL

## 2022-03-22 MED ORDER — MAGTRACE LYMPHATIC TRACER
INTRAMUSCULAR | Status: DC | PRN
  Administered 2022-03-22: 2 mL via INTRAMUSCULAR

## 2022-03-22 MED ORDER — CELECOXIB 200 MG PO CAPS
ORAL_CAPSULE | ORAL | Status: AC
  Filled 2022-03-22: qty 1

## 2022-03-22 MED ORDER — GABAPENTIN 300 MG PO CAPS
ORAL_CAPSULE | ORAL | Status: AC
  Filled 2022-03-22: qty 1

## 2022-03-22 MED ORDER — ONDANSETRON HCL 4 MG/2ML IJ SOLN: 4.0000 mg | Freq: Once | INTRAMUSCULAR | Status: DC | PRN

## 2022-03-22 MED ORDER — LIDOCAINE 2% (20 MG/ML) 5 ML SYRINGE
INTRAMUSCULAR | Status: DC | PRN
  Administered 2022-03-22: 100 mg via INTRAVENOUS

## 2022-03-22 MED ORDER — MIDAZOLAM HCL 2 MG/2ML IJ SOLN
INTRAMUSCULAR | Status: AC
  Filled 2022-03-22: qty 2

## 2022-03-22 MED ORDER — ROPIVACAINE HCL 5 MG/ML IJ SOLN
INTRAMUSCULAR | Status: DC | PRN
  Administered 2022-03-22: 30 mL

## 2022-03-22 MED ORDER — CEFAZOLIN SODIUM-DEXTROSE 2-4 GM/100ML-% IV SOLN
2.0000 g | INTRAVENOUS | Status: AC
  Administered 2022-03-22: 2 g via INTRAVENOUS

## 2022-03-22 MED ORDER — ACETAMINOPHEN 500 MG PO TABS
1000.0000 mg | ORAL_TABLET | ORAL | Status: AC
  Administered 2022-03-22: 1000 mg via ORAL

## 2022-03-22 MED ORDER — LIDOCAINE 2% (20 MG/ML) 5 ML SYRINGE
INTRAMUSCULAR | Status: AC
  Filled 2022-03-22: qty 5

## 2022-03-22 MED ORDER — ONDANSETRON HCL 4 MG/2ML IJ SOLN
INTRAMUSCULAR | Status: DC | PRN
  Administered 2022-03-22: 4 mg via INTRAVENOUS

## 2022-03-22 MED ORDER — KETOROLAC TROMETHAMINE 30 MG/ML IJ SOLN: 30.0000 mg | Freq: Once | INTRAMUSCULAR | Status: DC | PRN

## 2022-03-22 MED ORDER — PROPOFOL 10 MG/ML IV BOLUS
INTRAVENOUS | Status: DC | PRN
  Administered 2022-03-22: 200 mg via INTRAVENOUS

## 2022-03-22 MED ORDER — ACETAMINOPHEN 500 MG PO TABS
ORAL_TABLET | ORAL | Status: AC
  Filled 2022-03-22: qty 2

## 2022-03-22 MED ORDER — FENTANYL CITRATE (PF) 100 MCG/2ML IJ SOLN
INTRAMUSCULAR | Status: AC
  Filled 2022-03-22: qty 2

## 2022-03-22 MED ORDER — LACTATED RINGERS IV SOLN: INTRAVENOUS | Status: DC

## 2022-03-22 MED ORDER — PROPOFOL 10 MG/ML IV BOLUS
INTRAVENOUS | Status: AC
  Filled 2022-03-22: qty 20

## 2022-03-22 MED ORDER — PHENYLEPHRINE 80 MCG/ML (10ML) SYRINGE FOR IV PUSH (FOR BLOOD PRESSURE SUPPORT)
PREFILLED_SYRINGE | INTRAVENOUS | Status: DC | PRN
  Administered 2022-03-22 (×3): 80 ug via INTRAVENOUS

## 2022-03-22 MED ORDER — BUPIVACAINE-EPINEPHRINE 0.25% -1:200000 IJ SOLN
INTRAMUSCULAR | Status: DC | PRN
  Administered 2022-03-22: 8 mL

## 2022-03-22 MED ORDER — CEFAZOLIN SODIUM-DEXTROSE 2-4 GM/100ML-% IV SOLN
INTRAVENOUS | Status: AC
  Filled 2022-03-22: qty 100

## 2022-03-22 MED ORDER — OXYCODONE HCL 5 MG PO TABS: 5.0000 mg | ORAL_TABLET | Freq: Four times a day (QID) | ORAL | 0 refills | Status: DC | PRN

## 2022-03-22 MED ORDER — OXYCODONE HCL 5 MG PO TABS: 5.0000 mg | ORAL_TABLET | Freq: Once | ORAL | Status: DC | PRN

## 2022-03-22 MED ORDER — EPHEDRINE SULFATE-NACL 50-0.9 MG/10ML-% IV SOSY
PREFILLED_SYRINGE | INTRAVENOUS | Status: DC | PRN
  Administered 2022-03-22: 10 mg via INTRAVENOUS
  Administered 2022-03-22 (×2): 5 mg via INTRAVENOUS

## 2022-03-22 SURGICAL SUPPLY — 47 items
ADH SKN CLS APL DERMABOND .7 (GAUZE/BANDAGES/DRESSINGS) ×1
APL PRP STRL LF DISP 70% ISPRP (MISCELLANEOUS) ×1
APPLIER CLIP 9.375 MED OPEN (MISCELLANEOUS) ×2
APR CLP MED 9.3 20 MLT OPN (MISCELLANEOUS) ×1
BLADE SURG 15 STRL LF DISP TIS (BLADE) ×1 IMPLANT
BLADE SURG 15 STRL SS (BLADE) ×2
CANISTER SUC SOCK COL 7IN (MISCELLANEOUS) IMPLANT
CANISTER SUCT 1200ML W/VALVE (MISCELLANEOUS) IMPLANT
CHLORAPREP W/TINT 26 (MISCELLANEOUS) ×2 IMPLANT
CLIP APPLIE 9.375 MED OPEN (MISCELLANEOUS) ×1 IMPLANT
COVER BACK TABLE 60X90IN (DRAPES) ×2 IMPLANT
COVER MAYO STAND STRL (DRAPES) ×2 IMPLANT
COVER PROBE W GEL 5X96 (DRAPES) ×3 IMPLANT
DERMABOND ADVANCED (GAUZE/BANDAGES/DRESSINGS) ×1
DERMABOND ADVANCED .7 DNX12 (GAUZE/BANDAGES/DRESSINGS) ×1 IMPLANT
DRAPE LAPAROSCOPIC ABDOMINAL (DRAPES) ×2 IMPLANT
DRAPE UTILITY XL STRL (DRAPES) ×2 IMPLANT
ELECT COATED BLADE 2.86 ST (ELECTRODE) ×2 IMPLANT
ELECT REM PT RETURN 9FT ADLT (ELECTROSURGICAL) ×2
ELECTRODE REM PT RTRN 9FT ADLT (ELECTROSURGICAL) ×1 IMPLANT
GLOVE BIO SURGEON STRL SZ7.5 (GLOVE) ×3 IMPLANT
GLOVE BIOGEL PI IND STRL 7.0 (GLOVE) IMPLANT
GLOVE BIOGEL PI INDICATOR 7.0 (GLOVE) ×1
GOWN STRL REUS W/ TWL LRG LVL3 (GOWN DISPOSABLE) ×2 IMPLANT
GOWN STRL REUS W/TWL LRG LVL3 (GOWN DISPOSABLE) ×4
ILLUMINATOR WAVEGUIDE N/F (MISCELLANEOUS) IMPLANT
KIT MARKER MARGIN INK (KITS) ×2 IMPLANT
LIGHT WAVEGUIDE WIDE FLAT (MISCELLANEOUS) IMPLANT
NDL HYPO 25X1 1.5 SAFETY (NEEDLE) ×1 IMPLANT
NDL SAFETY ECLIPSE 18X1.5 (NEEDLE) IMPLANT
NEEDLE HYPO 18GX1.5 SHARP (NEEDLE)
NEEDLE HYPO 25X1 1.5 SAFETY (NEEDLE) ×2 IMPLANT
NS IRRIG 1000ML POUR BTL (IV SOLUTION) IMPLANT
PACK BASIN DAY SURGERY FS (CUSTOM PROCEDURE TRAY) ×2 IMPLANT
PENCIL SMOKE EVACUATOR (MISCELLANEOUS) ×2 IMPLANT
SLEEVE SCD COMPRESS KNEE MED (STOCKING) ×2 IMPLANT
SPIKE FLUID TRANSFER (MISCELLANEOUS) IMPLANT
SPONGE T-LAP 18X18 ~~LOC~~+RFID (SPONGE) ×2 IMPLANT
SUT MON AB 4-0 PC3 18 (SUTURE) ×4 IMPLANT
SUT SILK 2 0 SH (SUTURE) IMPLANT
SUT VICRYL 3-0 CR8 SH (SUTURE) ×2 IMPLANT
SYR CONTROL 10ML LL (SYRINGE) ×2 IMPLANT
TOWEL GREEN STERILE FF (TOWEL DISPOSABLE) ×2 IMPLANT
TRACER MAGTRACE VIAL (MISCELLANEOUS) ×1 IMPLANT
TRAY FAXITRON CT DISP (TRAY / TRAY PROCEDURE) ×2 IMPLANT
TUBE CONNECTING 20X1/4 (TUBING) ×1 IMPLANT
YANKAUER SUCT BULB TIP NO VENT (SUCTIONS) ×1 IMPLANT

## 2022-03-22 NOTE — Progress Notes (Signed)
Assisted Dr. Rose with left, pectoralis, ultrasound guided block. Side rails up, monitors on throughout procedure. See vital signs in flow sheet. Tolerated Procedure well. 

## 2022-03-22 NOTE — Anesthesia Procedure Notes (Signed)
Procedure Name: LMA Insertion Date/Time: 03/22/2022 11:16 AM  Performed by: Gwyndolyn Saxon, CRNAPre-anesthesia Checklist: Patient identified, Emergency Drugs available, Suction available and Patient being monitored Patient Re-evaluated:Patient Re-evaluated prior to induction Oxygen Delivery Method: Circle system utilized Preoxygenation: Pre-oxygenation with 100% oxygen Induction Type: IV induction Ventilation: Mask ventilation without difficulty LMA: LMA inserted LMA Size: 3.0 Number of attempts: 1 Placement Confirmation: positive ETCO2 and breath sounds checked- equal and bilateral Tube secured with: Tape Dental Injury: Teeth and Oropharynx as per pre-operative assessment

## 2022-03-22 NOTE — Transfer of Care (Signed)
Immediate Anesthesia Transfer of Care Note  Patient: Morgan Schmidt  Procedure(s) Performed: LEFT BREAST LUMPECTOMY WITH RADIOACTIVE SEED AND SENTINEL LYMPH NODE BIOPSY (Left: Breast)  Patient Location: PACU  Anesthesia Type:General and Regional  Level of Consciousness: drowsy  Airway & Oxygen Therapy: Patient Spontanous Breathing and Patient connected to face mask oxygen  Post-op Assessment: Report given to RN and Post -op Vital signs reviewed and stable  Post vital signs: Reviewed and stable  Last Vitals:  Vitals Value Taken Time  BP 123/71 03/22/22 1231  Temp 36.4 C 03/22/22 1230  Pulse 78 03/22/22 1235  Resp 14 03/22/22 1235  SpO2 97 % 03/22/22 1235  Vitals shown include unvalidated device data.  Last Pain:  Vitals:   03/22/22 0845  TempSrc: Oral  PainSc: 0-No pain         Complications: No notable events documented.

## 2022-03-22 NOTE — Anesthesia Procedure Notes (Signed)
Anesthesia Regional Block: Pectoralis block   Pre-Anesthetic Checklist: , timeout performed,  Correct Patient, Correct Site, Correct Laterality,  Correct Procedure, Correct Position, site marked,  Risks and benefits discussed,  Surgical consent,  Pre-op evaluation,  At surgeon's request and post-op pain management  Laterality: Left  Prep: chloraprep       Needles:  Injection technique: Single-shot  Needle Type: Echogenic Needle     Needle Length: 9cm      Additional Needles:   Procedures:,,,, ultrasound used (permanent image in chart),,    Narrative:  Start time: 03/22/2022 10:20 AM End time: 03/22/2022 10:30 AM Injection made incrementally with aspirations every 5 mL.  Performed by: Personally  Anesthesiologist: Myrtie Soman, MD  Additional Notes: Patient tolerated the procedure well without complications

## 2022-03-22 NOTE — Anesthesia Preprocedure Evaluation (Signed)
Anesthesia Evaluation  Patient identified by MRN, date of birth, ID band Patient awake    Reviewed: Allergy & Precautions, NPO status , Patient's Chart, lab work & pertinent test results  Airway Mallampati: II  TM Distance: <3 FB Neck ROM: Full    Dental  (+) Upper Dentures, Lower Dentures   Pulmonary neg pulmonary ROS, former smoker,    Pulmonary exam normal breath sounds clear to auscultation       Cardiovascular hypertension, Pt. on medications Normal cardiovascular exam Rhythm:Regular Rate:Normal     Neuro/Psych negative neurological ROS  negative psych ROS   GI/Hepatic negative GI ROS, Neg liver ROS,   Endo/Other  diabetes, Type 2, Insulin DependentHypothyroidism   Renal/GU negative Renal ROS  negative genitourinary   Musculoskeletal negative musculoskeletal ROS (+)   Abdominal   Peds negative pediatric ROS (+)  Hematology negative hematology ROS (+)   Anesthesia Other Findings   Reproductive/Obstetrics negative OB ROS                             Anesthesia Physical Anesthesia Plan  ASA: 3  Anesthesia Plan: General   Post-op Pain Management: Regional block*   Induction: Intravenous  PONV Risk Score and Plan: 3 and Ondansetron, Dexamethasone, Droperidol and Treatment may vary due to age or medical condition  Airway Management Planned: LMA  Additional Equipment:   Intra-op Plan:   Post-operative Plan: Extubation in OR  Informed Consent: I have reviewed the patients History and Physical, chart, labs and discussed the procedure including the risks, benefits and alternatives for the proposed anesthesia with the patient or authorized representative who has indicated his/her understanding and acceptance.     Dental advisory given  Plan Discussed with: CRNA and Surgeon  Anesthesia Plan Comments:         Anesthesia Quick Evaluation

## 2022-03-22 NOTE — Discharge Instructions (Signed)
No Tylenol before 3pm.  No Ibuprofen/NSAIDS until after 5pm.   Post Anesthesia Home Care Instructions  Activity: Get plenty of rest for the remainder of the day. A responsible individual must stay with you for 24 hours following the procedure.  For the next 24 hours, DO NOT: -Drive a car -Paediatric nurse -Drink alcoholic beverages -Take any medication unless instructed by your physician -Make any legal decisions or sign important papers.  Meals: Start with liquid foods such as gelatin or soup. Progress to regular foods as tolerated. Avoid greasy, spicy, heavy foods. If nausea and/or vomiting occur, drink only clear liquids until the nausea and/or vomiting subsides. Call your physician if vomiting continues.  Special Instructions/Symptoms: Your throat may feel dry or sore from the anesthesia or the breathing tube placed in your throat during surgery. If this causes discomfort, gargle with warm salt water. The discomfort should disappear within 24 hours.  If you had a scopolamine patch placed behind your ear for the management of post- operative nausea and/or vomiting:  1. The medication in the patch is effective for 72 hours, after which it should be removed.  Wrap patch in a tissue and discard in the trash. Wash hands thoroughly with soap and water. 2. You may remove the patch earlier than 72 hours if you experience unpleasant side effects which may include dry mouth, dizziness or visual disturbances. 3. Avoid touching the patch. Wash your hands with soap and water after contact with the patch.

## 2022-03-22 NOTE — H&P (Signed)
REFERRING PHYSICIAN: [facility] *  PROVIDER: Landry Corporal, MD  MRN: [id number] DOB: 12/06/1950 Subjective   Chief Complaint: Breast Cancer   History of Present Illness: Morgan Schmidt is a 71 y.o. female who is seen today as an office consultation for evaluation of Breast Cancer .   We are asked to see the patient in consultation by Dr. Lindi Adie to evaluate her for a new left breast cancer. The patient is a 71 year old white female who recently went for a routine screening mammogram. At that time she was found to have a 7 mm asymmetry in the upper outer quadrant of the left breast. The axilla looked negative. The asymmetry was biopsied and came back as a grade 2 invasive ductal cancer that was ER and PR positive and HER2 negative with a Ki-67 of 3%. She is otherwise in pretty good health and does not smoke. She has no family history of breast cancer.  Review of Systems: A complete review of systems was obtained from the patient. I have reviewed this information and discussed as appropriate with the patient. See HPI as well for other ROS.  ROS   Medical History: Past Medical History:  Diagnosis Date  Arthritis  Diabetes mellitus without complication (CMS-HCC)  History of cancer  Thyroid disease   Patient Active Problem List  Diagnosis  Controlled diabetes mellitus type 2 with complications (CMS-HCC)  Essential hypertension  Malignant neoplasm of upper-outer quadrant of left breast in female, estrogen receptor positive (CMS-HCC)   Past Surgical History:  Procedure Laterality Date  CESAREAN SECTION  CONVERSION PREVIOUS HIP SURGERY TO TOTAL HIP ARTHROPLASTY  thyroid surgery    Allergies  Allergen Reactions  Atorvastatin Calcium Swelling  Throat swelling  Ace Inhibitors Cough   Current Outpatient Medications on File Prior to Visit  Medication Sig Dispense Refill  ACCU-CHEK AVIVA PLUS TEST STRP test strip  acetaminophen (TYLENOL) 325 MG tablet Take by mouth   docusate (COLACE) 100 MG capsule Take 100 mg by mouth 2 (two) times daily  DROPLET PEN NEEDLE 31 gauge x 5/16" needle  LANTUS SOLOSTAR U-100 INSULIN pen injector (concentration 100 units/mL)  levothyroxine (SYNTHROID) 125 MCG tablet  metFORMIN (GLUCOPHAGE) 1000 MG tablet  olmesartan (BENICAR) 20 MG tablet  rosuvastatin (CRESTOR) 20 MG tablet  TRULICITY 4.5 EZ/6.6 mL subcutaneous pen injector   No current facility-administered medications on file prior to visit.   Family History  Problem Relation Age of Onset  Heart valve disease Mother  Arthritis Father    Social History   Tobacco Use  Smoking Status Former  Types: Cigarettes  Smokeless Tobacco Never    Social History   Socioeconomic History  Marital status: Divorced  Tobacco Use  Smoking status: Former  Types: Cigarettes  Smokeless tobacco: Never  Vaping Use  Vaping Use: Never used  Substance and Sexual Activity  Alcohol use: Never  Drug use: Never   Objective:  There were no vitals filed for this visit.  There is no height or weight on file to calculate BMI.  Physical Exam Vitals reviewed.  Constitutional:  General: She is not in acute distress. Appearance: Normal appearance.  HENT:  Head: Normocephalic and atraumatic.  Right Ear: External ear normal.  Left Ear: External ear normal.  Nose: Nose normal.  Mouth/Throat:  Mouth: Mucous membranes are moist.  Pharynx: Oropharynx is clear.  Eyes:  General: No scleral icterus. Extraocular Movements: Extraocular movements intact.  Conjunctiva/sclera: Conjunctivae normal.  Pupils: Pupils are equal, round, and reactive to light.  Cardiovascular:  Rate and Rhythm: Normal rate and regular rhythm.  Pulses: Normal pulses.  Heart sounds: Normal heart sounds.  Pulmonary:  Effort: Pulmonary effort is normal. No respiratory distress.  Breath sounds: Normal breath sounds.  Abdominal:  General: Bowel sounds are normal.  Palpations: Abdomen is soft.  Tenderness:  There is no abdominal tenderness.  Musculoskeletal:  General: No swelling, tenderness or deformity. Normal range of motion.  Cervical back: Normal range of motion and neck supple.  Skin: General: Skin is warm and dry.  Coloration: Skin is not jaundiced.  Neurological:  General: No focal deficit present.  Mental Status: She is alert and oriented to person, place, and time.  Psychiatric:  Mood and Affect: Mood normal.  Behavior: Behavior normal.    Breast: There is no palpable mass in either breast. There is no palpable axillary, supraclavicular, or cervical lymphadenopathy.  Labs, Imaging and Diagnostic Testing:  Assessment and Plan:   Diagnoses and all orders for this visit:  Malignant neoplasm of upper-outer quadrant of left breast in female, estrogen receptor positive (CMS-HCC)    The patient appears to have a 7 mm cancer in the upper outer quadrant of the left breast with clinically negative nodes and all favorable markers. I have discussed with her in detail the different options for treatment and at this point she favors breast conservation which I feel is very reasonable. She is also a good candidate for sentinel node biopsy. I have discussed with her in detail the risks and benefits of the operation as well as some of the technical aspects including the use of a radioactive seed for localization and she understands and wishes to proceed. She will also meet with medical and radiation oncology to discuss adjuvant therapy. We will move ahead with surgical planning.

## 2022-03-22 NOTE — Anesthesia Procedure Notes (Signed)
Anesthesia Procedure Image    

## 2022-03-22 NOTE — Op Note (Signed)
03/22/2022  12:22 PM  PATIENT:  Morgan Schmidt  71 y.o. female  PRE-OPERATIVE DIAGNOSIS:  LEFT BREAST CANCER  POST-OPERATIVE DIAGNOSIS:  LEFT BREAST CANCER  PROCEDURE:  Procedure(s) with comments: LEFT BREAST LUMPECTOMY WITH RADIOACTIVE SEED LOCALIZATION AND DEEP LEFT AXILLARY SENTINEL LYMPH NODE BIOPSY (Left) - GEN & PEC BLOCK  SURGEON:  Surgeon(s) and Role:    * Jovita Kussmaul, MD - Primary  PHYSICIAN ASSISTANT:   ASSISTANTS: none   ANESTHESIA:   local and general  EBL:  10 mL   BLOOD ADMINISTERED:none  DRAINS: none   LOCAL MEDICATIONS USED:  MARCAINE     SPECIMEN:  Source of Specimen:  left breast tissue and sentinel node x 2 plus left axillary tissue  DISPOSITION OF SPECIMEN:  PATHOLOGY  COUNTS:  YES  TOURNIQUET:  * No tourniquets in log *  DICTATION: .Dragon Dictation  After informed consent was obtained the patient was brought to the operating room and placed in the supine position on the operating table.  After adequate induction of general anesthesia the patient's left chest, breast, and axillary area were prepped with ChloraPrep, allowed to dry, and draped in usual sterile manner.  An appropriate timeout was performed.  Previously an I-125 seed was placed in the upper outer quadrant of the left breast to mark an area of invasive breast cancer.  At this point, 2 cc of iron oxide were injected into the subareolar plexus of the left breast and the breast was massaged for 5 minutes.  Attention was first turned to the breast tissue itself.  The neoprobe was set to I-125 in the area of radioactivity was readily identified.  The area around this was infiltrated with quarter percent Marcaine.  A crescent shaped incision was made the outer aspect of the left breast overlying the area of radioactivity.  The incision was carried through the skin and subcutaneous tissue sharply with the electrocautery.  Dissection was then carried out widely around the radioactive seed while  checking the area of radioactivity frequently.  Once the specimen was removed it was oriented with the appropriate paint colors.  A specimen radiograph was obtained that showed the clip and seed to be near the center of the specimen.  The specimen was then sent to pathology for further evaluation.  From the lumpectomy cavity the deep left axillary space was then entered.  Blunt hemostat dissection was carried out using the mag trace to guide the dissection.  I was able to identify a palpable lymph node with no signal.  This was excised sharply with the electrocautery and the surrounding small vessels and lymphatics were controlled with clips.  This was sent as sentinel node #1.  Another lymph node was identified with signal and it was also excised sharply with the electrocautery and the surrounding small vessels and lymphatics were controlled with clips.  This was sent as sentinel node #2.  An additional left axillary tissue was sent as well.  No other hot or palpable nodes were identified in the left axilla.  Hemostasis was achieved using the Bovie electrocautery.  The axilla was then closed with interrupted 3-0 Vicryl stitches.  The lumpectomy cavity was then closed on the deep side with interrupted 3-0 Vicryl stitches.  The skin was then closed with a running 4-0 Monocryl subcuticular stitch.  Dermabond dressings were applied.  The patient tolerated the procedure well.  At the end of the case all needle sponge and instrument counts were correct.  The patient was then  awakened and taken to recovery in stable condition.  PLAN OF CARE: Discharge to home after PACU  PATIENT DISPOSITION:  PACU - hemodynamically stable.   Delay start of Pharmacological VTE agent (>24hrs) due to surgical blood loss or risk of bleeding: not applicable

## 2022-03-22 NOTE — Interval H&P Note (Signed)
History and Physical Interval Note:  03/22/2022 10:40 AM  Morgan Schmidt  has presented today for surgery, with the diagnosis of LEFT BREAST CANCER.  The various methods of treatment have been discussed with the patient and family. After consideration of risks, benefits and other options for treatment, the patient has consented to  Procedure(s) with comments: LEFT BREAST LUMPECTOMY WITH RADIOACTIVE SEED AND SENTINEL LYMPH NODE BIOPSY (Left) - GEN & PEC BLOCK as a surgical intervention.  The patient's history has been reviewed, patient examined, no change in status, stable for surgery.  I have reviewed the patient's chart and labs.  Questions were answered to the patient's satisfaction.     [name]

## 2022-03-23 ENCOUNTER — Ambulatory Visit: Payer: Medicare HMO | Admitting: Hematology and Oncology

## 2022-03-23 ENCOUNTER — Encounter (HOSPITAL_BASED_OUTPATIENT_CLINIC_OR_DEPARTMENT_OTHER): Payer: Self-pay | Admitting: General Surgery

## 2022-03-23 NOTE — Anesthesia Postprocedure Evaluation (Signed)
Anesthesia Post Note  Patient: Morgan Schmidt  Procedure(s) Performed: LEFT BREAST LUMPECTOMY WITH RADIOACTIVE SEED AND SENTINEL LYMPH NODE BIOPSY (Left: Breast)     Patient location during evaluation: PACU Anesthesia Type: General Level of consciousness: awake and alert Pain management: pain level controlled Vital Signs Assessment: post-procedure vital signs reviewed and stable Respiratory status: spontaneous breathing, nonlabored ventilation, respiratory function stable and patient connected to nasal cannula oxygen Cardiovascular status: blood pressure returned to baseline and stable Postop Assessment: no apparent nausea or vomiting Anesthetic complications: no   No notable events documented.  Last Vitals:  Vitals:   03/22/22 1300 03/22/22 1314  BP: 126/62 (!) 141/68  Pulse: 73 73  Resp: 12   Temp:  (!) 36.2 C  SpO2: 95% 98%    Last Pain:  Vitals:   03/22/22 1314  TempSrc: Oral  PainSc: 0-No pain                 Cyrus Ramsburg S

## 2022-03-23 NOTE — Progress Notes (Signed)
Patient Care Team: Claretta Fraise, MD as PCP - General (Family Medicine) Jovita Kussmaul, MD as Consulting Physician (General Surgery) Nicholas Lose, MD as Consulting Physician (Hematology and Oncology) Gery Pray, MD as Consulting Physician (Radiation Oncology) Mauro Kaufmann, RN as Oncology Nurse Navigator Rockwell Germany, RN as Oncology Nurse Navigator  DIAGNOSIS:  Encounter Diagnosis  Name Primary?   Malignant neoplasm of upper-outer quadrant of left breast in female, estrogen receptor positive ([place])     SUMMARY OF ONCOLOGIC HISTORY: Oncology History  Malignant neoplasm of upper-outer quadrant of left breast in female, estrogen receptor positive ([place])  02/21/2022 Initial Diagnosis   Screening mammogram detected left breast asymmetry mass detected on ultrasound at 3 o'clock position measuring 0.7 cm, axilla negative, biopsy revealed grade 2 IDC with DCIS ER 95%, PR 90%, HER2 equivocal by IHC, FISH negative, Ki-67 3%   02/28/2022 Cancer Staging   Staging form: Breast, AJCC 8th Edition - Clinical: Stage IA (cT1b, cN0, cM0, G2, ER+, PR+, HER2-) - Signed by Nicholas Lose, MD on 02/28/2022 Stage prefix: Initial diagnosis Histologic grading system: 3 grade system    Genetic Testing   [facility] CustomNext Panel was Negative. Report date is 03/19/2022.  The CustomNext-Cancer+RNAinsight panel offered by [facility] includes sequencing and rearrangement analysis for the following 49 genes:  APC, ATM, AXIN2, BARD1, BMPR1A, BRCA1, BRCA2, BRIP1, CDH1, CDK4, CDKN2A, CHEK2, CTNNA1, DICER1, EPCAM, GREM1, HOXB13, KIT, MEN1, MLH1, MSH2, MSH3, MSH6, MUTYH, NBN, NF1, NTHL1, PALB2, PDGFRA, PMS2, POLD1, POLE, PRKAR1A, PTEN, RAD50, RAD51C, RAD51D, RET, SDHA, SDHB, SDHC, SDHD, SMAD4, SMARCA4, STK11, TP53, TSC1, TSC2, and VHL.  RNA data is routinely analyzed for use in variant interpretation for all genes.   03/22/2022 Surgery   Left lumpectomy: 9 mm grade 2 IDC with DCIS with clear margins 0/2 lymph nodes  negative ER 95%, PR 90%, HER2 2+ by IHC negative by FISH, Ki-67 3%     CHIEF COMPLIANT: Follow-up post op   INTERVAL HISTORY: Morgan Schmidt is a  70 y.o. female is here because of recent diagnosis of left breast mass. She presents to the clinic today for a follow-up after post op. She states that everything went well still a little sore. She had some concerns about which arm she could get her blood pressure taking on.   ALLERGIES:  is allergic to lipitor [atorvastatin calcium] and ace inhibitors.  MEDICATIONS:  Current Outpatient Medications  Medication Sig Dispense Refill   Accu-Chek FastClix Lancets MISC Use to check Blood Sugars twice daily. Dx E11.8 204 each 3   acetaminophen (TYLENOL) 325 MG tablet Take 325-650 mg by mouth every 6 (six) hours as needed (for pain or headaches).     Alcohol Swabs (B-D SINGLE USE SWABS REGULAR) PADS Use to check Blood Sugars twice daily. Dx E11.8 200 each 3   Blood Glucose Calibration (ACCU-CHEK AVIVA) SOLN Use to check Blood Sugars twice daily. Dx E11.8 1 each 3   Blood Glucose Monitoring Suppl (ACCU-CHEK AVIVA PLUS) w/Device KIT Use to check Blood Sugars twice daily. Dx E11.8 1 kit 0   Cholecalciferol (VITAMIN D) 2000 UNITS tablet Take 1 tablet (2,000 Units total) by mouth daily. (Patient taking differently: Take 2,000 Units by mouth every evening.) 30 tablet 11   diclofenac sodium (VOLTAREN) 1 % GEL Apply 2-4 g topically See admin instructions. Apply 2-4 grams to each hand up to 4 times a day as needed for arthritis in the fingers (Patient taking differently: Apply 2-4 g topically See admin instructions. Apply  2-4 grams to each hand up to 4 times a day as needed for arthritis in the fingers) 100 g 11   docusate sodium (COLACE) 100 MG capsule Take 1 capsule (100 mg total) by mouth 2 (two) times daily. 180 capsule 3   Dulaglutide (TRULICITY) 4.5 GB/1.5VV SOPN Inject 4.5 mg as directed once a week. Monday's 6.5 mL 3   glucose blood (ACCU-CHEK AVIVA PLUS)  test strip TEST BLOOD SUGAR TWICE DAILY Dx E11.8 200 strip 3   insulin glargine (LANTUS SOLOSTAR) 100 UNIT/ML Solostar Pen Inject 110 Units into the skin daily after breakfast. 105 mL 3   Insulin Pen Needle (DROPLET PEN NEEDLES) 31G X 8 MM MISC Use as direceted daily Dx E11.8 100 each 3   levothyroxine (SYNTHROID) 112 MCG tablet Take 1 tablet (112 mcg total) by mouth every other day. Alternate with 125 45 tablet 3   levothyroxine (SYNTHROID) 125 MCG tablet Take 1 tablet (125 mcg total) by mouth every other day. Alternate with 112 45 tablet 3   metFORMIN (GLUCOPHAGE) 1000 MG tablet Take 1 tablet (1,000 mg total) by mouth 2 (two) times daily with a meal. 180 tablet 3   olmesartan (BENICAR) 40 MG tablet Take 1 tablet (40 mg total) by mouth daily. 30 tablet 0   oxyCODONE (ROXICODONE) 5 MG immediate release tablet Take 1 tablet (5 mg total) by mouth every 6 (six) hours as needed for severe pain. 10 tablet 0   rosuvastatin (CRESTOR) 20 MG tablet TAKE 1 TABLET EVERY DAY FOR CHOLESTEROL (Patient taking differently: Take 20 mg by mouth at bedtime.) 90 tablet 0   traMADol (ULTRAM) 50 MG tablet Take 1-2 tablets (50-100 mg total) by mouth every 6 (six) hours as needed. 20 tablet 0   No current facility-administered medications for this visit.    PHYSICAL EXAMINATION: ECOG PERFORMANCE STATUS: 1 - Symptomatic but completely ambulatory  Vitals:   04/03/22 1458  BP: (!) 140/52  Pulse: 95  Resp: 18  Temp: 97.7 F (36.5 C)  SpO2: 95%   Filed Weights   04/03/22 1458  Weight: 171 lb 4.8 oz (77.7 kg)      LABORATORY DATA:  I have reviewed the data as listed    Latest Ref Rng & Units 04/02/2022    1:15 PM 02/28/2022   12:42 PM 12/06/2021    9:05 AM  CMP  Glucose 70 - 99 mg/dL 138  190  89   BUN 8 - 27 mg/dL _0 Creatinine 0.57 - 1.00 mg/dL 0.57  0.71  0.63   Sodium 134 - 144 mmol/L 140  138  142   Potassium 3.5 - 5.2 mmol/L 4.7  3.9  4.0   Chloride 96 - 106 mmol/L 100  102  103   CO2 20  - 29 mmol/L _1 Calcium 8.7 - 10.3 mg/dL 9.0  8.8  8.1   Total Protein 6.0 - 8.5 g/dL 6.6  6.8  6.6   Total Bilirubin 0.0 - 1.2 mg/dL 0.6  0.8  0.5   Alkaline Phos 44 - 121 IU/L 86  70  71   AST 0 - 40 IU/L _2 ALT 0 - 32 IU/L _3 Lab Results  Component Value Date   WBC 6.8 04/02/2022   HGB 13.6 04/02/2022   HCT 41.5 04/02/2022   MCV 83 04/02/2022   PLT 221 04/02/2022  NEUTROABS 4.1 04/02/2022    ASSESSMENT & PLAN:  Malignant neoplasm of upper-outer quadrant of left breast in female, estrogen receptor positive ([place]) 03/22/2022:Left lumpectomy: 9 mm grade 2 IDC with DCIS with clear margins 0/2 lymph nodes negative ER 95%, PR 90%, HER2 2+ by IHC negative by FISH, Ki-67 3%  Pathology counseling: I discussed the final pathology report of the patient provided  a copy of this report. I discussed the margins as well as lymph node surgeries. We also discussed the final staging along with previously performed ER/PR and HER-2/neu testing.   Treatment plan: 1.  Oncotype DX testing 2. adjuvant radiation 3.  Adjuvant antiestrogen therapy  Return to clinic based upon Oncotype DX test result     No orders of the defined types were placed in this encounter.  The patient has a good understanding of the overall plan. she agrees with it. she will call with any problems that may develop before the next visit here. Total time spent: 30 mins including face to face time and time spent for planning, charting and co-ordination of care   Harriette Ohara, MD 04/03/22    I Gardiner Coins am scribing for Dr. Lindi Adie  I have reviewed the above documentation for accuracy and completeness, and I agree with the above.

## 2022-03-26 ENCOUNTER — Ambulatory Visit: Payer: Self-pay | Admitting: Genetic Counselor

## 2022-03-26 DIAGNOSIS — Z1379 Encounter for other screening for genetic and chromosomal anomalies: Secondary | ICD-10-CM

## 2022-03-26 DIAGNOSIS — D013 Carcinoma in situ of anus and anal canal: Secondary | ICD-10-CM | POA: Diagnosis not present

## 2022-03-26 LAB — SURGICAL PATHOLOGY

## 2022-03-26 NOTE — Progress Notes (Signed)
HPI:   Morgan Schmidt was previously seen in the [facility] due to a personal history of cancer and concerns regarding a hereditary predisposition to cancer. Please refer to our prior cancer genetics clinic note for more information regarding our discussion, assessment and recommendations, at the time. Morgan Schmidt recent genetic test results were disclosed to Morgan Schmidt, as were recommendations warranted by these results. These results and recommendations are discussed in more detail below.  CANCER HISTORY:  Oncology History  Malignant neoplasm of upper-outer quadrant of left breast in female, estrogen receptor positive ([name])  02/21/2022 Initial Diagnosis   Screening mammogram detected left breast asymmetry mass detected on ultrasound at 3 o'clock position measuring 0.7 cm, axilla negative, biopsy revealed grade 2 IDC with DCIS ER 95%, PR 90%, HER2 equivocal by IHC, FISH negative, Ki-67 3%   02/28/2022 Cancer Staging   Staging form: Breast, AJCC 8th Edition - Clinical: Stage IA (cT1b, cN0, cM0, G2, ER+, PR+, HER2-) - Signed by Nicholas Lose, MD on 02/28/2022 Stage prefix: Initial diagnosis Histologic grading system: 3 grade system    Genetic Testing   [facility] CustomNext Panel was Negative. Report date is 03/19/2022.  The CustomNext-Cancer+RNAinsight panel offered by [facility] includes sequencing and rearrangement analysis for the following 49 genes:  APC, ATM, AXIN2, BARD1, BMPR1A, BRCA1, BRCA2, BRIP1, CDH1, CDK4, CDKN2A, CHEK2, CTNNA1, DICER1, EPCAM, GREM1, HOXB13, KIT, MEN1, MLH1, MSH2, MSH3, MSH6, MUTYH, NBN, NF1, NTHL1, PALB2, PDGFRA, PMS2, POLD1, POLE, PRKAR1A, PTEN, RAD50, RAD51C, RAD51D, RET, SDHA, SDHB, SDHC, SDHD, SMAD4, SMARCA4, STK11, TP53, TSC1, TSC2, and VHL.  RNA data is routinely analyzed for use in variant interpretation for all genes.     FAMILY HISTORY:  We obtained a detailed, 4-generation family history. Morgan Schmidt reports no family history of cancer or  hereditary cancer genetic testing. There is no reported Ashkenazi Jewish ancestry.          GENETIC TEST RESULTS:  The [facility] CustomNext Panel found no pathogenic mutations.   The CustomNext-Cancer+RNAinsight panel offered by [facility] includes sequencing and rearrangement analysis for the following 49 genes:  APC, ATM, AXIN2, BARD1, BMPR1A, BRCA1, BRCA2, BRIP1, CDH1, CDK4, CDKN2A, CHEK2, CTNNA1, DICER1, EPCAM, GREM1, HOXB13, KIT, MEN1, MLH1, MSH2, MSH3, MSH6, MUTYH, NBN, NF1, NTHL1, PALB2, PDGFRA, PMS2, POLD1, POLE, PRKAR1A, PTEN, RAD50, RAD51C, RAD51D, RET, SDHA, SDHB, SDHC, SDHD, SMAD4, SMARCA4, STK11, TP53, TSC1, TSC2, and VHL.  RNA data is routinely analyzed for use in variant interpretation for all genes.  The test report has been scanned into EPIC and is located under the Molecular Pathology section of the Results Review tab.  A portion of the result report is included below for reference. Genetic testing reported out on 03/19/2022.        Even though a pathogenic variant was not identified, possible explanations for the cancer in the family may include: There may be no hereditary risk for cancer in the family. The cancers in Morgan Schmidt and/or Morgan Schmidt family may be due to other genetic or environmental factors. There may be a gene mutation in one of these genes that current testing methods cannot detect, but that chance is small. There could be another gene that has not yet been discovered, or that we have not yet tested, that is responsible for the cancer diagnoses in the family.   Therefore, it is important to remain in touch with cancer genetics in the future so that we can continue to offer Morgan Schmidt the most up to date genetic testing.  ADDITIONAL GENETIC TESTING:  We discussed with Morgan Schmidt that Morgan Schmidt genetic testing was fairly extensive.  If there are genes identified to increase cancer risk that can be analyzed in the future, we would be happy to discuss and coordinate  this testing at that time.    CANCER SCREENING RECOMMENDATIONS:  Morgan Schmidt test result is considered negative (normal).  This means that we have not identified a hereditary cause for Morgan Schmidt personal history of cancer at this time. Most cancers happen by chance and this negative test suggests that Morgan Schmidt cancer may fall into this category.    An individual's cancer risk and medical management are not determined by genetic test results alone. Overall cancer risk assessment incorporates additional factors, including personal medical history, family history, and any available genetic information that may result in a personalized plan for cancer prevention and surveillance. Therefore, it is recommended she continue to follow the cancer management and screening guidelines provided by Morgan Schmidt oncology and primary healthcare provider.  RECOMMENDATIONS FOR FAMILY MEMBERS:   Since she did not inherit a mutation in a cancer predisposition gene included on this panel, Morgan Schmidt children could not have inherited a mutation from Morgan Schmidt in one of these genes. Individuals in this family might be at some increased risk of developing cancer, over the general population risk, due to the family history of cancer. We recommend women in this family have a yearly mammogram beginning at age 37, or 54 years younger than the earliest onset of cancer, an annual clinical breast exam, and perform monthly breast self-exams.  FOLLOW-UP:  Cancer genetics is a rapidly advancing field and it is possible that new genetic tests will be appropriate for Morgan Schmidt and/or Morgan Schmidt family members in the future. We encouraged Morgan Schmidt to remain in contact with cancer genetics on an annual basis so we can update Morgan Schmidt personal and family histories and let Morgan Schmidt know of advances in cancer genetics that may benefit this family.   Our contact number was provided. Morgan Schmidt questions were answered to Morgan Schmidt satisfaction, and she knows she is welcome to call us at anytime with  additional questions or concerns.   Morgan Passy, MS, [facility] Genetic Counselor [email or web address] (P) [phone]

## 2022-03-27 ENCOUNTER — Encounter: Payer: Self-pay | Admitting: Genetic Counselor

## 2022-03-28 ENCOUNTER — Telehealth: Payer: Self-pay | Admitting: *Deleted

## 2022-03-28 ENCOUNTER — Encounter: Payer: Self-pay | Admitting: *Deleted

## 2022-03-28 NOTE — Telephone Encounter (Signed)
Ordered oncotype per Dr. Gudena.  Requisition sent to pathology and exact sciences. 

## 2022-04-02 ENCOUNTER — Ambulatory Visit (INDEPENDENT_AMBULATORY_CARE_PROVIDER_SITE_OTHER): Payer: Medicare HMO | Admitting: Family Medicine

## 2022-04-02 ENCOUNTER — Other Ambulatory Visit (HOSPITAL_COMMUNITY)
Admission: RE | Admit: 2022-04-02 | Discharge: 2022-04-02 | Disposition: A | Discharge status: Home / Self Care | Payer: Medicare HMO | Source: Ambulatory Visit | Attending: Family Medicine | Admitting: Family Medicine

## 2022-04-02 ENCOUNTER — Encounter: Payer: Self-pay | Admitting: Family Medicine

## 2022-04-02 VITALS — BP 157/76 | HR 77 | Temp 98.6°F | Ht 59.0 in | Wt 172.4 lb

## 2022-04-02 DIAGNOSIS — Z23 Encounter for immunization: Secondary | ICD-10-CM

## 2022-04-02 DIAGNOSIS — E118 Type 2 diabetes mellitus with unspecified complications: Secondary | ICD-10-CM

## 2022-04-02 DIAGNOSIS — E1169 Type 2 diabetes mellitus with other specified complication: Secondary | ICD-10-CM | POA: Diagnosis not present

## 2022-04-02 DIAGNOSIS — Z124 Encounter for screening for malignant neoplasm of cervix: Secondary | ICD-10-CM | POA: Diagnosis not present

## 2022-04-02 DIAGNOSIS — Z9189 Other specified personal risk factors, not elsewhere classified: Secondary | ICD-10-CM | POA: Diagnosis not present

## 2022-04-02 DIAGNOSIS — E782 Mixed hyperlipidemia: Secondary | ICD-10-CM | POA: Diagnosis not present

## 2022-04-02 DIAGNOSIS — Z794 Long term (current) use of insulin: Secondary | ICD-10-CM

## 2022-04-02 DIAGNOSIS — I10 Essential (primary) hypertension: Secondary | ICD-10-CM

## 2022-04-02 DIAGNOSIS — E038 Other specified hypothyroidism: Secondary | ICD-10-CM

## 2022-04-02 LAB — BAYER DCA HB A1C WAIVED: HB A1C (BAYER DCA - WAIVED): 7.5 % — ABNORMAL HIGH (ref 4.8–5.6)

## 2022-04-02 MED ORDER — METFORMIN HCL 1000 MG PO TABS: 1000.0000 mg | ORAL_TABLET | Freq: Two times a day (BID) | ORAL | 3 refills | Status: DC

## 2022-04-02 MED ORDER — TRULICITY 4.5 MG/0.5ML ~~LOC~~ SOAJ
4.5000 mg | SUBCUTANEOUS | 3 refills | Status: DC
Start: 2022-04-02 — End: 2023-04-25

## 2022-04-02 MED ORDER — LEVOTHYROXINE SODIUM 112 MCG PO TABS: 112.0000 ug | ORAL_TABLET | ORAL | 3 refills | Status: DC

## 2022-04-02 MED ORDER — LEVOTHYROXINE SODIUM 125 MCG PO TABS: 125.0000 ug | ORAL_TABLET | ORAL | 3 refills | Status: DC

## 2022-04-02 NOTE — Progress Notes (Signed)
 Subjective:  [name], female    DOB: 11/08/1950  Age: 71 y.o. MRN: [id number]  CC: Annual Exam   HPI Morgan Schmidt presents for follow up of diabetes and Annual exam. Recently had rectal cancer surgery. Two weeks ago had left sided breast lumpectomy.   presents forFollow-up of diabetes. Patient checks blood sugar at home. A little higher due to recent procedures. Patient denies symptoms such as polyuria, polydipsia, excessive hunger, nausea No significant hypoglycemic spells noted. Medications reviewed. Pt reports taking them regularly without complication/adverse reaction being reported today.  Lab Results  Component Value Date   HGBA1C 7.4 (H) 12/06/2021   HGBA1C 7.6 (H) 09/07/2021   HGBA1C 7.0 (H) 05/10/2021      follow-up on  thyroid. The patient has a history of hypothyroidism for many years. It has been stable recently. Pt. denies any change in  voice, loss of hair, heat or cold intolerance. Energy level has been adequate to good. Patient denies constipation and diarrhea. No myxedema. Medication is as noted below. Verified that pt is taking it daily on an empty stomach. Well tolerated.      04/02/2022    1:08 PM 12/06/2021    9:07 AM 09/29/2021    2:23 PM  Depression screen PHQ 2/9  Decreased Interest 0 0 0  Down, Depressed, Hopeless 0 0 0  PHQ - 2 Score 0 0 0    History Morgan Schmidt has a past medical history of AIN (anal intraepithelial neoplasia) anal canal, Cancer (HCC) (03/15/2022), History of adenomatous polyp of colon, History of thyroid cancer (06/2008), Hyperlipidemia, Hypertension, Hypothyroidism, postsurgical (06/2008), OA (osteoarthritis), Type 2 diabetes mellitus treated with insulin (HCC), and Wears dentures.   She has a past surgical history that includes Total thyroidectomy (Bilateral, 06/30/2008); Cesarean section (1985); Knee arthroscopy (Left, 10/22/2016); Knee arthroscopy with medial menisectomy (10/22/2016); Knee arthroscopy with  subchondroplasty (Left, 10/22/2016); Hip pinning, cannulated (Left, 03/10/2018); Colonoscopy with propofol (11/10/2021); Anal intraepithelial neoplasia excision (N/A, 11/30/2021); High resolution anoscopy (N/A, 11/30/2021); and Breast lumpectomy with radioactive seed and sentinel lymph node biopsy (Left, 03/22/2022).   Her family history includes Arthritis in her father; Heart disease in her mother.She reports that she quit smoking about 21 years ago. Her smoking use included cigarettes. She has a 2.50 pack-year smoking history. She has never used smokeless tobacco. She reports that she does not drink alcohol and does not use drugs.    ROS Review of Systems  Constitutional:  Negative for appetite change, chills, diaphoresis, fatigue, fever and unexpected weight change.  HENT:  Negative for congestion, ear pain, hearing loss, postnasal drip, rhinorrhea, sneezing, sore throat and trouble swallowing.   Eyes:  Negative for pain.  Respiratory:  Negative for cough, chest tightness and shortness of breath.   Cardiovascular:  Negative for chest pain and palpitations.  Gastrointestinal:  Negative for abdominal pain, constipation, diarrhea, nausea and vomiting.  Endocrine: Negative for cold intolerance, heat intolerance, polydipsia, polyphagia and polyuria.  Genitourinary:  Negative for dysuria, frequency and menstrual problem.  Musculoskeletal:  Negative for arthralgias and joint swelling.  Skin:  Negative for rash.  Allergic/Immunologic: Negative for environmental allergies.  Neurological:  Negative for dizziness, weakness, numbness and headaches.  Psychiatric/Behavioral:  Negative for agitation and dysphoric mood.     Objective:  BP (!) 157/76   Pulse 77   Temp 98.6 F (37 C)   Ht 4' 11" (1.499 m)   Wt 172 lb 6.4 oz (78.2 kg)   LMP 03/02/2004     SpO2 96%   BMI 34.82 kg/m   BP Readings from Last 3 Encounters:  04/02/22 (!) 157/76  03/22/22 (!) 141/68  02/28/22 (!) 149/76    Wt Readings  from Last 3 Encounters:  04/02/22 172 lb 6.4 oz (78.2 kg)  03/22/22 171 lb 1.2 oz (77.6 kg)  02/28/22 169 lb 14.4 oz (77.1 kg)     Physical Exam Exam conducted with a chaperone present.  Constitutional:      General: She is not in acute distress.    Appearance: Normal appearance. She is well-developed.  HENT:     Head: Normocephalic and atraumatic.     Right Ear: External ear normal.     Left Ear: External ear normal.     Nose: Nose normal.  Eyes:     Conjunctiva/sclera: Conjunctivae normal.     Pupils: Pupils are equal, round, and reactive to light.  Neck:     Thyroid: No thyromegaly.  Cardiovascular:     Rate and Rhythm: Normal rate and regular rhythm.     Heart sounds: Normal heart sounds. No murmur heard. Pulmonary:     Effort: Pulmonary effort is normal. No respiratory distress.     Breath sounds: Normal breath sounds. No wheezing or rales.  Chest:  Breasts:    Breasts are symmetrical.     Right: No inverted nipple, mass or tenderness.     Left: No inverted nipple, mass or tenderness.  Abdominal:     General: Bowel sounds are normal. There is no distension or abdominal bruit.     Palpations: Abdomen is soft. There is no hepatomegaly, splenomegaly or mass.     Tenderness: There is no abdominal tenderness. Negative signs include Murphy's sign and McBurney's sign.     Hernia: There is no hernia in the left inguinal area or right inguinal area.  Genitourinary:    General: Normal vulva.     Exam position: Lithotomy position.     Pubic Area: No rash.      Labia:        Right: No rash, tenderness or lesion.        Left: No rash, tenderness or lesion.      Vagina: Normal.     Cervix: No cervical motion tenderness, friability, lesion or cervical bleeding.  Musculoskeletal:        General: No tenderness. Normal range of motion.     Cervical back: Normal range of motion and neck supple.  Lymphadenopathy:     Cervical: No cervical adenopathy.  Skin:    General: Skin is  warm and dry.     Findings: No rash.  Neurological:     Mental Status: She is alert and oriented to person, place, and time.     Deep Tendon Reflexes: Reflexes are normal and symmetric.  Psychiatric:        Behavior: Behavior normal.        Thought Content: Thought content normal.        Judgment: Judgment normal.       Assessment & Plan:   Morgan Schmidt was seen today for annual exam.  Diagnoses and all orders for this visit:  Other specified hypothyroidism -     TSH + free T4 -     levothyroxine (SYNTHROID) 112 MCG tablet; Take 1 tablet (112 mcg total) by mouth every other day. Alternate with 125  Mixed diabetic hyperlipidemia associated with type 2 diabetes mellitus (HCC) -     Lipid panel  Essential hypertension -       CBC with Differential/Platelet -     CMP14+EGFR  Controlled diabetes mellitus type 2 with complications, unspecified whether long term insulin use (HCC) -     Bayer DCA Hb A1c Waived  Controlled type 2 diabetes mellitus with complication, with long-term current use of insulin (HCC) -     metFORMIN (GLUCOPHAGE) 1000 MG tablet; Take 1 tablet (1,000 mg total) by mouth 2 (two) times daily with a meal.  GYN exam for high-risk Medicare patient  Other orders -     levothyroxine (SYNTHROID) 125 MCG tablet; Take 1 tablet (125 mcg total) by mouth every other day. Alternate with 112 -     Dulaglutide (TRULICITY) 4.5 MG/0.5ML SOPN; Inject 4.5 mg as directed once a week. Monday's       I have changed Morgan Schmidt's Trulicity. I am also having her maintain her Vitamin D, acetaminophen, diclofenac sodium, Accu-Chek Aviva Plus, Accu-Chek Aviva, B-D SINGLE USE SWABS REGULAR, Accu-Chek FastClix Lancets, Lantus SoloStar, Droplet Pen Needles, docusate sodium, rosuvastatin, traMADol, olmesartan, Accu-Chek Aviva Plus, oxyCODONE, levothyroxine, levothyroxine, and metFORMIN.  Allergies as of 04/02/2022       Reactions   Lipitor [atorvastatin Calcium] Swelling   Throat  swelling   Ace Inhibitors Cough        Medication List        Accurate as of April 02, 2022  1:55 PM. If you have any questions, ask your nurse or doctor.          Accu-Chek Aviva Plus test strip Generic drug: glucose blood TEST BLOOD SUGAR TWICE DAILY Dx E11.8   Accu-Chek Aviva Plus w/Device Kit Use to check Blood Sugars twice daily. Dx E11.8   Accu-Chek Aviva Soln Use to check Blood Sugars twice daily. Dx E11.8   Accu-Chek FastClix Lancets Misc Use to check Blood Sugars twice daily. Dx E11.8   acetaminophen 325 MG tablet Commonly known as: TYLENOL Take 325-650 mg by mouth every 6 (six) hours as needed (for pain or headaches).   B-D SINGLE USE SWABS REGULAR Pads Use to check Blood Sugars twice daily. Dx E11.8   diclofenac sodium 1 % Gel Commonly known as: VOLTAREN Apply 2-4 g topically See admin instructions. Apply 2-4 grams to each hand up to 4 times a day as needed for arthritis in the fingers   docusate sodium 100 MG capsule Commonly known as: Colace Take 1 capsule (100 mg total) by mouth 2 (two) times daily.   Droplet Pen Needles 31G X 8 MM Misc Generic drug: Insulin Pen Needle Use as direceted daily Dx E11.8   Lantus SoloStar 100 UNIT/ML Solostar Pen Generic drug: insulin glargine Inject 110 Units into the skin daily after breakfast.   levothyroxine 112 MCG tablet Commonly known as: SYNTHROID Take 1 tablet (112 mcg total) by mouth every other day. Alternate with 125 What changed: additional instructions   levothyroxine 125 MCG tablet Commonly known as: SYNTHROID Take 1 tablet (125 mcg total) by mouth every other day. Alternate with 112 What changed: additional instructions   metFORMIN 1000 MG tablet Commonly known as: GLUCOPHAGE Take 1 tablet (1,000 mg total) by mouth 2 (two) times daily with a meal.   olmesartan 40 MG tablet Commonly known as: BENICAR Take 1 tablet (40 mg total) by mouth daily.   oxyCODONE 5 MG immediate release  tablet Commonly known as: Roxicodone Take 1 tablet (5 mg total) by mouth every 6 (six) hours as needed for severe pain.   rosuvastatin 20 MG tablet Commonly known as: CRESTOR   TAKE 1 TABLET EVERY DAY FOR CHOLESTEROL What changed: See the new instructions.   traMADol 50 MG tablet Commonly known as: ULTRAM Take 1-2 tablets (50-100 mg total) by mouth every 6 (six) hours as needed.   Trulicity 4.5 MG/0.5ML Sopn Generic drug: Dulaglutide Inject 4.5 mg as directed once a week. Monday's   Vitamin D 50 MCG (2000 UT) tablet Take 1 tablet (2,000 Units total) by mouth daily. What changed: when to take this         Follow-up: Return in about 3 months (around 07/03/2022).  Warren Stacks, M.D. 

## 2022-04-02 NOTE — Addendum Note (Signed)
Addended by: Baldomero Lamy B on: 04/02/2022 03:06 PM   Modules accepted: Orders

## 2022-04-03 ENCOUNTER — Encounter: Payer: Self-pay | Admitting: *Deleted

## 2022-04-03 ENCOUNTER — Other Ambulatory Visit: Payer: Self-pay

## 2022-04-03 ENCOUNTER — Inpatient Hospital Stay: Payer: Medicare HMO | Attending: Hematology and Oncology | Admitting: Hematology and Oncology

## 2022-04-03 DIAGNOSIS — Z87891 Personal history of nicotine dependence: Secondary | ICD-10-CM | POA: Diagnosis not present

## 2022-04-03 DIAGNOSIS — Z170 Estrogen receptor positive status [ER+]: Secondary | ICD-10-CM

## 2022-04-03 DIAGNOSIS — C50412 Malignant neoplasm of upper-outer quadrant of left female breast: Secondary | ICD-10-CM | POA: Diagnosis not present

## 2022-04-03 LAB — CBC WITH DIFFERENTIAL/PLATELET
Basophils Absolute: 0.1 10*3/uL (ref 0.0–0.2)
Basos: 1 %
EOS (ABSOLUTE): 0.1 10*3/uL (ref 0.0–0.4)
Eos: 2 %
Hematocrit: 41.5 % (ref 34.0–46.6)
Hemoglobin: 13.6 g/dL (ref 11.1–15.9)
Immature Grans (Abs): 0 10*3/uL (ref 0.0–0.1)
Immature Granulocytes: 0 %
Lymphocytes Absolute: 2 10*3/uL (ref 0.7–3.1)
Lymphs: 29 %
MCH: 27.3 pg (ref 26.6–33.0)
MCHC: 32.8 g/dL (ref 31.5–35.7)
MCV: 83 fL (ref 79–97)
Monocytes Absolute: 0.4 10*3/uL (ref 0.1–0.9)
Monocytes: 6 %
Neutrophils Absolute: 4.1 10*3/uL (ref 1.4–7.0)
Neutrophils: 62 %
Platelets: 221 10*3/uL (ref 150–450)
RBC: 4.98 x10E6/uL (ref 3.77–5.28)
RDW: 13.4 % (ref 11.7–15.4)
WBC: 6.8 10*3/uL (ref 3.4–10.8)

## 2022-04-03 LAB — LIPID PANEL
Chol/HDL Ratio: 3 ratio (ref 0.0–4.4)
Cholesterol, Total: 139 mg/dL (ref 100–199)
HDL: 46 mg/dL (ref >39)
LDL Chol Calc (NIH): 59 mg/dL (ref 0–99)
Triglycerides: 206 mg/dL — ABNORMAL HIGH (ref 0–149)
VLDL Cholesterol Cal: 34 mg/dL (ref 5–40)

## 2022-04-03 LAB — CMP14+EGFR
ALT: 16 IU/L (ref 0–32)
AST: 19 IU/L (ref 0–40)
Albumin/Globulin Ratio: 2.5 — ABNORMAL HIGH (ref 1.2–2.2)
Albumin: 4.7 g/dL (ref 3.9–4.9)
Alkaline Phosphatase: 86 IU/L (ref 44–121)
BUN/Creatinine Ratio: 18 (ref 12–28)
BUN: 10 mg/dL (ref 8–27)
Bilirubin Total: 0.6 mg/dL (ref 0.0–1.2)
CO2: 26 mmol/L (ref 20–29)
Calcium: 9 mg/dL (ref 8.7–10.3)
Chloride: 100 mmol/L (ref 96–106)
Creatinine, Ser: 0.57 mg/dL (ref 0.57–1.00)
Globulin, Total: 1.9 g/dL (ref 1.5–4.5)
Glucose: 138 mg/dL — ABNORMAL HIGH (ref 70–99)
Potassium: 4.7 mmol/L (ref 3.5–5.2)
Sodium: 140 mmol/L (ref 134–144)
Total Protein: 6.6 g/dL (ref 6.0–8.5)
eGFR: 98 mL/min/{1.73_m2} (ref >59)

## 2022-04-03 LAB — TSH+FREE T4
Free T4: 1.89 ng/dL — ABNORMAL HIGH (ref 0.82–1.77)
TSH: 0.053 u[IU]/mL — ABNORMAL LOW (ref 0.450–4.500)

## 2022-04-03 NOTE — Assessment & Plan Note (Signed)
03/22/2022:Left lumpectomy: 9 mm grade 2 IDC with DCIS with clear margins 0/2 lymph nodes negative ER 95%, PR 90%, HER2 2+ by IHC negative by FISH, Ki-67 3%  Pathology counseling: I discussed the final pathology report of the patient provided  a copy of this report. I discussed the margins as well as lymph node surgeries. We also discussed the final staging along with previously performed ER/PR and HER-2/neu testing.   Treatment plan: 1.  Oncotype DX testing 2. adjuvant radiation 3.  Adjuvant antiestrogen therapy  Return to clinic based upon Oncotype DX test result

## 2022-04-04 ENCOUNTER — Other Ambulatory Visit: Payer: Self-pay | Admitting: *Deleted

## 2022-04-04 DIAGNOSIS — E038 Other specified hypothyroidism: Secondary | ICD-10-CM

## 2022-04-04 LAB — CYTOLOGY - PAP: Diagnosis: NEGATIVE

## 2022-04-04 MED ORDER — LEVOTHYROXINE SODIUM 112 MCG PO TABS: 112.0000 ug | ORAL_TABLET | Freq: Every day | ORAL | 3 refills | Status: DC

## 2022-04-11 NOTE — Therapy (Signed)
OUTPATIENT PHYSICAL THERAPY BREAST CANCER POST OP FOLLOW UP   Patient Name: Morgan Schmidt MRN: [id number] DOB:1951/04/17, 71 y.o., female Today's Date: 04/12/2022   PT End of Session - 04/12/22 3419     Visit Number 2    Number of Visits 2    Date for PT Re-Evaluation 04/18/22    PT Start Time 0902    PT Stop Time 0918    PT Time Calculation (min) 16 min    Activity Tolerance Patient tolerated treatment well    Behavior During Therapy [facility] for tasks assessed/performed             Past Medical History:  Diagnosis Date   AIN (anal intraepithelial neoplasia) anal canal    Cancer ([place]) 03/15/2022   breast   History of adenomatous polyp of colon    History of thyroid cancer 06/2008   s/p  total bilateral thyroidectomy for symptomatic multinodular goiter;   06-30-2008 s/p total thyroidectomy -- papillary thyroid cancer  and treated w/ RAI post surgery 09-29-2008   (last scan negative for recurrence in epic 03/ 2011)   Hyperlipidemia    Hypertension    Hypothyroidism, postsurgical 06/2008   and had RAI post surgery 02/ 2010;   followed by pcp   OA (osteoarthritis)    Type 2 diabetes mellitus treated with insulin ([place])    followed by pcp   (11-23-2021  per pt checks daily in am and at times in afternoon,  fasting sugar--- 95--110)   Wears dentures    upper   Past Surgical History:  Procedure Laterality Date   ANAL INTRAEPITHELIAL NEOPLASIA EXCISION N/A 11/30/2021   Procedure: EXCISIONAL BIOPSY ANAL LESIONS;  Surgeon: Leighton Ruff, MD;  Location: [facility];  Service: General;  Laterality: N/A;   BREAST LUMPECTOMY WITH RADIOACTIVE SEED AND SENTINEL LYMPH NODE BIOPSY Left 03/22/2022   Procedure: LEFT BREAST LUMPECTOMY WITH RADIOACTIVE SEED AND SENTINEL LYMPH NODE BIOPSY;  Surgeon: Jovita Kussmaul, MD;  Location: [facility];  Service: General;  Laterality: Left;  [place]   W/  BILATERAL TUBAL LIGATION   COLONOSCOPY  WITH PROPOFOL  11/10/2021   by Dr.Pyrtle   HIGH RESOLUTION ANOSCOPY N/A 11/30/2021   Procedure: HIGH RESOLUTION ANOSCOPY;  Surgeon: Leighton Ruff, MD;  Location: [facility];  Service: General;  Laterality: N/A;   HIP PINNING,CANNULATED Left 03/10/2018   Procedure: CANNULATED HIP PINNING;  Surgeon: Paralee Cancel, MD;  Location: [facility];  Service: Orthopedics;  Laterality: Left;   KNEE ARTHROSCOPY Left 10/22/2016   Procedure: ARTHROSCOPY LEFT KNEE WITH DEBRIDEMENT;  Surgeon: Paralee Cancel, MD;  Location: [facility];  Service: Orthopedics;  Laterality: Left;   KNEE ARTHROSCOPY WITH MEDIAL MENISECTOMY  10/22/2016   Procedure: KNEE ARTHROSCOPY WITH MEDIAL MENISECTOMY;  Surgeon: Paralee Cancel, MD;  Location: [facility];  Service: Orthopedics;;   KNEE ARTHROSCOPY WITH SUBCHONDROPLASTY Left 10/22/2016   Procedure: KNEE ARTHROSCOPY WITH MEDIAL CHONDROPLASTY;  Surgeon: Paralee Cancel, MD;  Location: [facility];  Service: Orthopedics;  Laterality: Left;   TOTAL THYROIDECTOMY Bilateral 06/30/2008   @[facility]  by dr Excell Seltzer   Patient Active Problem List   Diagnosis Date Noted   Genetic testing 03/20/2022   Malignant neoplasm of upper-outer quadrant of left breast in female, estrogen receptor positive ([place]) 02/26/2022   Pain in left knee 07/01/2018   Controlled diabetes mellitus type 2 with complications ([place]) 37/90/2409   Essential hypertension  03/23/2016   Other specified hypothyroidism 12/16/2014   Mixed diabetic hyperlipidemia associated with type 2 diabetes mellitus ([place]) 12/16/2014   Long term current use of insulin ([place]) 12/16/2014   Polyposis of colon 12/16/2014    PCP: Claretta Fraise, MD   REFERRING PROVIDER: Nicholas Lose, MD   REFERRING DIAG: [phone] (ICD-10-CM) - Malignant neoplasm of upper-outer quadrant of left breast in female, estrogen receptor positive ([place])   THERAPY DIAG:  Abnormal posture  Malignant neoplasm of  upper-outer quadrant of left breast in female, estrogen receptor positive ([place])  Rationale for Evaluation and Treatment Rehabilitation  ONSET DATE: 02/21/22  SUBJECTIVE:                                                                                                                                                                                           SUBJECTIVE STATEMENT: I am not really having any issues.  PERTINENT HISTORY:  Patient was diagnosed on 02/21/22 with left grade 2. It measures 0.7 cm and is located in the upper-outer quadrant. It is ER+, PR+, HER2- with a Ki67 of 3%. Hx of rectal cancer and surgery in 12/2021. 03/22/22 L breast lumpectomy and SLNB 0/2  PATIENT GOALS:  Reassess how my recovery is going related to arm function, pain, and swelling.  PAIN:  Are you having pain? No  PRECAUTIONS: Recent Surgery, left UE Lymphedema risk,   ACTIVITY LEVEL / LEISURE: has been doing her arm stretches a couple of times a day   OBJECTIVE:   PATIENT SURVEYS:  QUICK DASH:  Quick Dash - 04/12/22 0001     Open a tight or new jar Mild difficulty    Do heavy household chores (wash walls, wash floors) No difficulty    Carry a shopping bag or briefcase No difficulty    Wash your back No difficulty    Use a knife to cut food No difficulty    Recreational activities in which you take some force or impact through your arm, shoulder, or hand (golf, hammering, tennis) No difficulty    During the past week, to what extent has your arm, shoulder or hand problem interfered with your normal social activities with family, friends, neighbors, or groups? Not at all    During the past week, to what extent has your arm, shoulder or hand problem limited your work or other regular daily activities Not at all    Arm, shoulder, or hand pain. None    Tingling (pins and needles) in your arm, shoulder, or hand None    Difficulty Sleeping No difficulty    DASH Score 2.27 %  OBSERVATIONS:  Scar healing well with glue still intact, some minimal edema over area of scar but breast over not swollen, educated pt to continue to wear compression bra until all swelling resolves  POSTURE:  Forward head, rounded shoulders  LYMPHEDEMA ASSESSMENT:   UPPER EXTREMITY AROM/PROM:   A/PROM RIGHT   eval    Shoulder extension 75  Shoulder flexion 156  Shoulder abduction 160  Shoulder internal rotation 53  Shoulder external rotation 78                          (Blank rows = not tested)   A/PROM LEFT   eval LEFT 04/12/22  Shoulder extension 72 70  Shoulder flexion 155 165  Shoulder abduction 179 171  Shoulder internal rotation 53 55  Shoulder external rotation 85 74                          (Blank rows = not tested)     CERVICAL AROM: All within normal limits:      Percent limited  Flexion WFL  Extension WFL  Right lateral flexion WFL  Left lateral flexion WFL  Right rotation WFL  Left rotation WFL        UPPER EXTREMITY STRENGTH: 5/5     LYMPHEDEMA ASSESSMENTS:    LANDMARK RIGHT   eval  10 cm proximal to olecranon process 29  Olecranon process 25  10 cm proximal to ulnar styloid process 23  Just proximal to ulnar styloid process 16.4  Across hand at thumb web space 20.5  At base of 2nd digit 6.6  (Blank rows = not tested)   LANDMARK LEFT   eval LEFT 04/12/22  10 cm proximal to olecranon process 28.5 28.7  Olecranon process 24.5 24.5  10 cm proximal to ulnar styloid process 23 22.4  Just proximal to ulnar styloid process 16 15.8  Across hand at thumb web space 19 19.2  At base of 2nd digit 6.5 6.4  (Blank rows = not tested)    Surgery type/Date: 03/22/22 L breast lumpectomy and SLNB Number of lymph nodes removed: 0/2 Current/past treatment (chemo, radiation, hormone therapy): unsure if she will need chemo, will most likely require radiation, will take an estrogen blocker after radiation Other symptoms:  Heaviness/tightness  No Pain No Pitting edema No Infections No Decreased scar mobility Yes scar still healing, educating pt in scar mobilization after 6 weeks Stemmer sign No   PATIENT EDUCATION:  Education details: ABC class, scar mobilization, walking program, posture Person educated: Patient Education method: Explanation and Handouts Education comprehension: verbalized understanding   HOME EXERCISE PROGRAM:  Reviewed previously given post op HEP. Walking program Goal post stretch in supine to improve ER  ASSESSMENT:  CLINICAL IMPRESSION: Pt returns to PT after undergoing a L lumpectomy and SLNB (0/2) on 03/22/22. She has returned to baseline ROM overall - she was lacking a few degrees in direction of external rotation so educated pt in goal post stretch in supine. Her scar is healing well with glue still intact. Educated pt in scar mobilization. Pt is signed up for the ABC class. She will be discharged from skilled PT services as she has no needs at this time but will continue with Ldex screenings every 3 months for the first 2 years post op to detect sub clinical lymphedema.   Pt will benefit from skilled therapeutic intervention to improve on the following deficits: Decreased knowledge of  precautions, impaired UE functional use, pain, decreased ROM, postural dysfunction.   PT treatment/interventions: ADL/Self care home management, Therapeutic exercises, Patient/Family education, Self Care, and Re-evaluation     GOALS: Goals reviewed with patient? Yes  LONG TERM GOALS:  (STG=LTG)  GOALS Name Target Date  Goal status  1 Pt will demonstrate she has regained full shoulder ROM and function post operatively compared to baselines.  Baseline: 04/12/22   MET 04/12/22     PLAN: PT FREQUENCY/DURATION: d/c today  PLAN FOR NEXT SESSION: continue with ldex screenings every 3 months for the first 2 years post op   [facility]  [address]  [place]  [address]  [phone]  After Breast Cancer Class It is recommended you attend the ABC class to be educated on lymphedema risk reduction. This class is free of charge and lasts for 1 hour. It is a 1-time class. You will need to download the Webex app either on your phone or computer. We will send you a link the night before or the morning of the class. You should be able to click on that link to join the class. This is not a confidential class. You don't have to turn your camera on, but other participants may be able to see your email address.  Scar massage You can begin gentle scar massage to you incision sites. Gently place one hand on the incision and move the skin (without sliding on the skin) in various directions. Do this for a few minutes and then you can gently massage either coconut oil or vitamin E cream into the scars.  Compression garment You should continue wearing your compression bra until you feel like you no longer have swelling.  Home exercise Program Continue doing the exercises you were given until you feel like you can do them without feeling any tightness at the end.   Walking Program Studies show that 30 minutes of walking per day (fast enough to elevate your heart rate) can significantly reduce the risk of a cancer recurrence. If you can't walk due to other medical reasons, we encourage you to find another activity you could do (like a stationary bike or water exercise).  Posture After breast cancer surgery, people frequently sit with rounded shoulders posture because it puts their incisions on slack and feels better. If you sit like this and scar tissue forms in that position, you can become very tight and have pain sitting or standing with good posture. Try to be aware of your posture and sit and stand up tall to heal properly.  Follow up PT: It is recommended you return every 3 months for the first 3 years following surgery to be assessed on the SOZO machine for an  L-Dex score. This helps prevent clinically significant lymphedema in 95% of patients. These follow up screens are 10 minute appointments that you are not billed for.  Allyson Sabal Shaniko, PT 04/12/2022, 9:53 AM   PHYSICAL THERAPY DISCHARGE SUMMARY  Visits from Start of Care: 2  Current functional level related to goals / functional outcomes: All goals met   Remaining deficits: None   Education / Equipment: HEP, walking program, lymphedema   Patient agrees to discharge. Patient goals were met. Patient is being discharged due to meeting the stated rehab goals.  Allyson Sabal [address], [place] 04/12/22 9:53 AM

## 2022-04-12 ENCOUNTER — Encounter: Payer: Self-pay | Admitting: Physical Therapy

## 2022-04-12 ENCOUNTER — Encounter: Payer: Self-pay | Admitting: *Deleted

## 2022-04-12 ENCOUNTER — Ambulatory Visit: Payer: Medicare HMO | Attending: Hematology and Oncology | Admitting: Physical Therapy

## 2022-04-12 DIAGNOSIS — Z170 Estrogen receptor positive status [ER+]: Secondary | ICD-10-CM | POA: Insufficient documentation

## 2022-04-12 DIAGNOSIS — C50412 Malignant neoplasm of upper-outer quadrant of left female breast: Secondary | ICD-10-CM | POA: Diagnosis not present

## 2022-04-12 DIAGNOSIS — R293 Abnormal posture: Secondary | ICD-10-CM | POA: Diagnosis not present

## 2022-04-13 DIAGNOSIS — C50412 Malignant neoplasm of upper-outer quadrant of left female breast: Secondary | ICD-10-CM | POA: Diagnosis not present

## 2022-04-13 DIAGNOSIS — Z170 Estrogen receptor positive status [ER+]: Secondary | ICD-10-CM | POA: Diagnosis not present

## 2022-04-16 ENCOUNTER — Telehealth: Payer: Self-pay | Admitting: *Deleted

## 2022-04-16 ENCOUNTER — Encounter (HOSPITAL_COMMUNITY): Payer: Self-pay

## 2022-04-16 ENCOUNTER — Encounter: Payer: Self-pay | Admitting: *Deleted

## 2022-04-16 DIAGNOSIS — C50412 Malignant neoplasm of upper-outer quadrant of left female breast: Secondary | ICD-10-CM

## 2022-04-16 NOTE — Telephone Encounter (Signed)
Received oncotype score of 21. Physician team notified. Called pt, discussed chemo not recommended and next step is xrt with Dr.Kinard. Received verbal understanding.  Encourage pt to call with further needs or questions.  Referral placed for Dr. Sondra Come.

## 2022-04-17 NOTE — Progress Notes (Signed)
Radiation Oncology         [phone] ________________________________  Name: Morgan Schmidt MRN: [id number]  Date: 04/18/2022  DOB: 15-Jan-1951  Re-Evaluation Note  CC: Claretta Fraise, MD  Nicholas Lose, MD  No diagnosis found.  Diagnosis:  S/p lumpectomy and SLN bx's: Stage IA (cT1b, cN0, cM0) Left Breast UOQ, Invasive Ductal Carcinoma, ER+ / PR+ / Her2-, Grade 2   Cancer Staging  Malignant neoplasm of upper-outer quadrant of left breast in female, estrogen receptor positive ([place]) Staging form: Breast, AJCC 8th Edition - Clinical: Stage IA (cT1b, cN0, cM0, G2, ER+, PR+, HER2-) - Signed by Nicholas Lose, MD on 02/28/2022  Narrative:  The patient returns today to discuss radiation treatment options. She was seen in the multidisciplinary breast clinic on 02/28/22.   Since consultation, she underwent genetic testing on 02/28/22. Results showed no clinically significant variants detected by BRCAplus or +RNAinsight testing.  She opted to proceed with left breast lumpectomy with nodal biopsies on 03/22/22 under the care of Dr. Marlou Starks. Pathology from the procedure revealed: histology of grade 2 invasive ductal carcinoma with clear margins of resection; tumor the size of 9 mm X 8 mm. X 8 mm; nodal status of 2/2 left axillary sentinel lymph nodes excisions negative for carcinoma. Prognostic indicators significant for: estrogen receptor 95% positive; progesterone receptor 90% positive; Proliferation marker Ki67 at 3%; Her2 status negative; Grade 2.   Oncotype DX was obtained on the final surgical sample and the recurrence score of 21 predicts a risk of recurrence outside the breast over the next 9 years of 7%, if the patient's only systemic therapy is an antiestrogen for 5 years.  It also predicts no significant benefit from chemotherapy.  Based on Oncotype results, the patient will not require chemotherapy and will return to Dr. Lindi Adie in the near future to discuss antiestrogen treatment  options.   During her most recent post-op follow up with Dr. Marlou Starks on 04/16/22, the patient was noted to be healing well from surgery and without signs of infection or seroma on examination.   On review of systems, the patient reports ***. She denies *** and any other symptoms.    Allergies:  is allergic to lipitor [atorvastatin calcium] and ace inhibitors.  Meds: Current Outpatient Medications  Medication Sig Dispense Refill   Accu-Chek FastClix Lancets MISC Use to check Blood Sugars twice daily. Dx E11.8 204 each 3   acetaminophen (TYLENOL) 325 MG tablet Take 325-650 mg by mouth every 6 (six) hours as needed (for pain or headaches).     Alcohol Swabs (B-D SINGLE USE SWABS REGULAR) PADS Use to check Blood Sugars twice daily. Dx E11.8 200 each 3   Blood Glucose Calibration (ACCU-CHEK AVIVA) SOLN Use to check Blood Sugars twice daily. Dx E11.8 1 each 3   Blood Glucose Monitoring Suppl (ACCU-CHEK AVIVA PLUS) w/Device KIT Use to check Blood Sugars twice daily. Dx E11.8 1 kit 0   Cholecalciferol (VITAMIN D) 2000 UNITS tablet Take 1 tablet (2,000 Units total) by mouth daily. (Patient taking differently: Take 2,000 Units by mouth every evening.) 30 tablet 11   diclofenac sodium (VOLTAREN) 1 % GEL Apply 2-4 g topically See admin instructions. Apply 2-4 grams to each hand up to 4 times a day as needed for arthritis in the fingers (Patient taking differently: Apply 2-4 g topically See admin instructions. Apply 2-4 grams to each hand up to 4 times a day as needed for arthritis in the fingers) 100 g 11   docusate  sodium (COLACE) 100 MG capsule Take 1 capsule (100 mg total) by mouth 2 (two) times daily. 180 capsule 3   Dulaglutide (TRULICITY) 4.5 KD/3.2IZ SOPN Inject 4.5 mg as directed once a week. Monday's 6.5 mL 3   glucose blood (ACCU-CHEK AVIVA PLUS) test strip TEST BLOOD SUGAR TWICE DAILY Dx E11.8 200 strip 3   insulin glargine (LANTUS SOLOSTAR) 100 UNIT/ML Solostar Pen Inject 110 Units into the skin  daily after breakfast. 105 mL 3   Insulin Pen Needle (DROPLET PEN NEEDLES) 31G X 8 MM MISC Use as direceted daily Dx E11.8 100 each 3   levothyroxine (SYNTHROID) 112 MCG tablet Take 1 tablet (112 mcg total) by mouth daily. DOSE CHANGE 90 tablet 3   metFORMIN (GLUCOPHAGE) 1000 MG tablet Take 1 tablet (1,000 mg total) by mouth 2 (two) times daily with a meal. 180 tablet 3   olmesartan (BENICAR) 40 MG tablet Take 1 tablet (40 mg total) by mouth daily. 30 tablet 0   oxyCODONE (ROXICODONE) 5 MG immediate release tablet Take 1 tablet (5 mg total) by mouth every 6 (six) hours as needed for severe pain. 10 tablet 0   rosuvastatin (CRESTOR) 20 MG tablet TAKE 1 TABLET EVERY DAY FOR CHOLESTEROL (Patient taking differently: Take 20 mg by mouth at bedtime.) 90 tablet 0   traMADol (ULTRAM) 50 MG tablet Take 1-2 tablets (50-100 mg total) by mouth every 6 (six) hours as needed. 20 tablet 0   No current facility-administered medications for this encounter.    Physical Findings: The patient is in no acute distress. Patient is alert and oriented.  vitals were not taken for this visit.  No significant changes. Lungs are clear to auscultation bilaterally. Heart has regular rate and rhythm. No palpable cervical, supraclavicular, or axillary adenopathy. Abdomen soft, non-tender, normal bowel sounds. Right Breast: no palpable mass, nipple discharge or bleeding. Left Breast: ***  Lab Findings: Lab Results  Component Value Date   WBC 6.8 04/02/2022   HGB 13.6 04/02/2022   HCT 41.5 04/02/2022   MCV 83 04/02/2022   PLT 221 04/02/2022    Radiographic Findings: MM Breast Surgical Specimen  Result Date: 03/22/2022 CLINICAL DATA:  Status post seed localized lumpectomy of the LEFT breast. EXAM: SPECIMEN RADIOGRAPH OF THE LEFT BREAST COMPARISON:  Previous exam(s). FINDINGS: Status post excision of the left breast. The radioactive seed and ribbon shaped biopsy marker clip are present, completely intact. Findings are  discussed with the operating room nurse at the time of interpretation. IMPRESSION: Specimen radiograph of the LEFT breast. Electronically Signed   By: Nolon Nations M.D.   On: 03/22/2022 11:51  MM LT RADIOACTIVE SEED LOC MAMMO GUIDE  Result Date: 03/21/2022 CLINICAL DATA:  Biopsy proven invasive ductal carcinoma and ductal carcinoma in-situ. Localization prior to surgery. EXAM: MAMMOGRAPHIC GUIDED RADIOACTIVE SEED LOCALIZATION OF THE LEFT BREAST COMPARISON:  Previous exam(s). FINDINGS: Patient presents for radioactive seed localization prior to surgery. I met with the patient and we discussed the procedure of seed localization including benefits and alternatives. We discussed the high likelihood of a successful procedure. We discussed the risks of the procedure including infection, bleeding, tissue injury and further surgery. We discussed the low dose of radioactivity involved in the procedure. Informed, written consent was given. The usual time-out protocol was performed immediately prior to the procedure. Using mammographic guidance, sterile technique, 1% lidocaine and an I-125 radioactive seed, the ribbon shaped clip was localized using a lateral to medial approach. The follow-up mammogram images confirm the seed  in the expected location and were marked for Dr. Marlou Starks. Follow-up survey of the patient confirms presence of the radioactive seed. Order number of I-125 seed:  [id number]. Total activity:  7.877 millicuries reference Date: 02/23/2022 The patient tolerated the procedure well and was released from the [facility]. She was given instructions regarding seed removal. IMPRESSION: Radioactive seed localization left breast. No apparent complications. Electronically Signed   By: Lillia Mountain M.D.   On: 03/21/2022 14:01   Impression:  S/p lumpectomy and SLN bx's: Stage IA (cT1b, cN0, cM0) Left Breast UOQ, Invasive Ductal Carcinoma, ER+ / PR+ / Her2-, Grade 2  ***  Plan:  Patient is scheduled for CT  simulation {date/later today}. ***  -----------------------------------  Blair Promise, PhD, MD  This document serves as a record of services personally performed by Gery Pray, MD. It was created on his behalf by Roney Mans, a trained medical scribe. The creation of this record is based on the scribe's personal observations and the provider's statements to them. This document has been checked and approved by the attending provider.

## 2022-04-17 NOTE — Progress Notes (Signed)
Location of Breast Cancer:upper-outer quadrant of left breast    Histology per Pathology Report:   03/22/2022 FINAL MICROSCOPIC DIAGNOSIS:   A. BREAST, LEFT, LUMPECTOMY:  Invasive ductal carcinoma in the vicinity of biopsy site with clear  margins of resection.  Please see the synoptic report after specimen D.   B. LYMPH NODE, LEFT #1, SENTINEL, EXCISION:  Lymph node negative for metastatic carcinoma.   C. BREAST, LEFT AXILLARY, TISSUE:  Portion of normal adipose tissue.  Lymph node or mammary tissue is not identified in the specimen.   D. LYMPH NODE, LEFT #2, SENTINEL, EXCISION:  Follicular hyperplasia of the lymph node which is negative for  metastatic carcinoma.   Receptor Status: ER(95%), PR (90%), Her2-neu (negative), Ki-(3%)  Did patient present with symptoms (if so, please note symptoms) or was this found on screening mammography?: screening mammogram  Past/Anticipated interventions by surgeon, if AOO:LDZW BREAST LUMPECTOMY WITH RADIOACTIVE SEED LOCALIZATION AND DEEP LEFT AXILLARY SENTINEL LYMPH NODE BIOPSY (Left) - GEN & PEC BLOCK by Dr. Marlou Starks 03/22/2022  Past/Anticipated interventions by medical oncology, if any: Adjuvant antiestrogen therapy to follow radiation.  Lymphedema issues, if any:  no    Pain issues, if any:  no   SAFETY ISSUES: Prior radiation? Swallowed a radioactive pill for thyroid cancer in 2010 Pacemaker/ICD? no Possible current pregnancy?no Is the patient on methotrexate? no  Current Complaints / other details:  None noted.  BP (!) 141/61 (BP Location: Right Arm, Patient Position: Sitting, Cuff Size: Normal)   Pulse 75   Temp (!) 97.5 F (36.4 C)   Resp 20   Ht 4' 11"  (1.499 m)   Wt 170 lb 3.2 oz (77.2 kg)   LMP 03/02/2004   SpO2 98%   BMI 34.38 kg/m      Jacqulyn Liner, RN 04/17/2022,8:23 AM

## 2022-04-18 ENCOUNTER — Ambulatory Visit
Admission: RE | Admit: 2022-04-18 | Discharge: 2022-04-18 | Disposition: A | Discharge status: Home / Self Care | Payer: Medicare HMO | Source: Ambulatory Visit | Attending: Radiation Oncology | Admitting: Radiation Oncology

## 2022-04-18 ENCOUNTER — Other Ambulatory Visit: Payer: Self-pay

## 2022-04-18 ENCOUNTER — Encounter: Payer: Self-pay | Admitting: Radiation Oncology

## 2022-04-18 VITALS — BP 141/61 | HR 75 | Temp 97.5°F | Resp 20 | Ht 59.0 in | Wt 170.2 lb

## 2022-04-18 DIAGNOSIS — Z79899 Other long term (current) drug therapy: Secondary | ICD-10-CM | POA: Insufficient documentation

## 2022-04-18 DIAGNOSIS — C50412 Malignant neoplasm of upper-outer quadrant of left female breast: Secondary | ICD-10-CM | POA: Diagnosis not present

## 2022-04-18 DIAGNOSIS — Z170 Estrogen receptor positive status [ER+]: Secondary | ICD-10-CM | POA: Insufficient documentation

## 2022-04-18 DIAGNOSIS — Z7984 Long term (current) use of oral hypoglycemic drugs: Secondary | ICD-10-CM | POA: Insufficient documentation

## 2022-04-18 DIAGNOSIS — Z79890 Hormone replacement therapy (postmenopausal): Secondary | ICD-10-CM | POA: Insufficient documentation

## 2022-04-18 DIAGNOSIS — Z7985 Long-term (current) use of injectable non-insulin antidiabetic drugs: Secondary | ICD-10-CM | POA: Diagnosis not present

## 2022-04-19 ENCOUNTER — Ambulatory Visit
Admission: RE | Admit: 2022-04-19 | Discharge: 2022-04-19 | Disposition: A | Discharge status: Home / Self Care | Payer: Medicare HMO | Source: Ambulatory Visit | Attending: Radiation Oncology | Admitting: Radiation Oncology

## 2022-04-19 DIAGNOSIS — C50412 Malignant neoplasm of upper-outer quadrant of left female breast: Secondary | ICD-10-CM | POA: Diagnosis not present

## 2022-04-19 DIAGNOSIS — Z170 Estrogen receptor positive status [ER+]: Secondary | ICD-10-CM | POA: Diagnosis not present

## 2022-04-24 ENCOUNTER — Encounter: Payer: Self-pay | Admitting: *Deleted

## 2022-04-25 ENCOUNTER — Other Ambulatory Visit: Payer: Self-pay | Admitting: Family Medicine

## 2022-04-25 ENCOUNTER — Encounter (HOSPITAL_COMMUNITY): Payer: Self-pay

## 2022-04-25 DIAGNOSIS — E1169 Type 2 diabetes mellitus with other specified complication: Secondary | ICD-10-CM

## 2022-04-29 DIAGNOSIS — Z170 Estrogen receptor positive status [ER+]: Secondary | ICD-10-CM | POA: Insufficient documentation

## 2022-04-29 DIAGNOSIS — C50412 Malignant neoplasm of upper-outer quadrant of left female breast: Secondary | ICD-10-CM | POA: Diagnosis not present

## 2022-05-01 ENCOUNTER — Ambulatory Visit
Admission: RE | Admit: 2022-05-01 | Discharge: 2022-05-01 | Disposition: A | Discharge status: Home / Self Care | Payer: Medicare HMO | Source: Ambulatory Visit | Attending: Radiation Oncology | Admitting: Radiation Oncology

## 2022-05-01 ENCOUNTER — Other Ambulatory Visit: Payer: Self-pay

## 2022-05-01 ENCOUNTER — Ambulatory Visit: Payer: Medicare HMO

## 2022-05-01 DIAGNOSIS — Z510 Encounter for antineoplastic radiation therapy: Secondary | ICD-10-CM | POA: Diagnosis not present

## 2022-05-01 DIAGNOSIS — C50412 Malignant neoplasm of upper-outer quadrant of left female breast: Secondary | ICD-10-CM | POA: Diagnosis not present

## 2022-05-01 DIAGNOSIS — Z170 Estrogen receptor positive status [ER+]: Secondary | ICD-10-CM

## 2022-05-01 LAB — RAD ONC ARIA SESSION SUMMARY
Course Elapsed Days: 0
Plan Fractions Treated to Date: 1
Plan Prescribed Dose Per Fraction: 2.67 Gy
Plan Total Fractions Prescribed: 16
Plan Total Prescribed Dose: 42.72 Gy
Reference Point Dosage Given to Date: 2.67 Gy
Reference Point Session Dosage Given: 2.67 Gy
Session Number: 1

## 2022-05-01 MED ORDER — ALRA NON-METALLIC DEODORANT (RAD-ONC)
1.0000 | Freq: Once | TOPICAL | Status: AC
  Administered 2022-05-01: 1 via TOPICAL

## 2022-05-01 MED ORDER — SONAFINE EX EMUL
1.0000 | Freq: Once | CUTANEOUS | Status: AC
  Administered 2022-05-01: 1 via TOPICAL

## 2022-05-01 NOTE — Progress Notes (Signed)
Pt here for patient teaching.    Pt given Radiation and You booklet, skin care instructions, [place] deodorant, and Sonafine.    Reviewed areas of pertinence such as fatigue, hair loss in treatment field, skin changes, throat changes, breast tenderness, and breast swelling .   Pt able to give teach back of to pat skin, use unscented/gentle soap, and use baby wipes,apply Sonafine bid, avoid applying anything to skin within 4 hours of treatment, avoid wearing an under wire bra, and to use an electric razor if they must shave.   Pt verbalizes understanding of information given and will contact nursing with any questions or concerns.

## 2022-05-02 ENCOUNTER — Other Ambulatory Visit: Payer: Self-pay

## 2022-05-02 ENCOUNTER — Ambulatory Visit
Admission: RE | Admit: 2022-05-02 | Discharge: 2022-05-02 | Disposition: A | Discharge status: Home / Self Care | Payer: Medicare HMO | Source: Ambulatory Visit | Attending: Radiation Oncology | Admitting: Radiation Oncology

## 2022-05-02 DIAGNOSIS — C50412 Malignant neoplasm of upper-outer quadrant of left female breast: Secondary | ICD-10-CM | POA: Diagnosis not present

## 2022-05-02 DIAGNOSIS — Z170 Estrogen receptor positive status [ER+]: Secondary | ICD-10-CM | POA: Diagnosis not present

## 2022-05-02 DIAGNOSIS — Z510 Encounter for antineoplastic radiation therapy: Secondary | ICD-10-CM | POA: Diagnosis not present

## 2022-05-02 LAB — RAD ONC ARIA SESSION SUMMARY
Course Elapsed Days: 1
Plan Fractions Treated to Date: 2
Plan Prescribed Dose Per Fraction: 2.67 Gy
Plan Total Fractions Prescribed: 16
Plan Total Prescribed Dose: 42.72 Gy
Reference Point Dosage Given to Date: 5.34 Gy
Reference Point Session Dosage Given: 2.67 Gy
Session Number: 2

## 2022-05-03 ENCOUNTER — Other Ambulatory Visit: Payer: Self-pay

## 2022-05-03 ENCOUNTER — Ambulatory Visit
Admission: RE | Admit: 2022-05-03 | Discharge: 2022-05-03 | Disposition: A | Discharge status: Home / Self Care | Payer: Medicare HMO | Source: Ambulatory Visit | Attending: Radiation Oncology | Admitting: Radiation Oncology

## 2022-05-03 DIAGNOSIS — Z170 Estrogen receptor positive status [ER+]: Secondary | ICD-10-CM | POA: Diagnosis not present

## 2022-05-03 DIAGNOSIS — Z510 Encounter for antineoplastic radiation therapy: Secondary | ICD-10-CM | POA: Diagnosis not present

## 2022-05-03 DIAGNOSIS — C50412 Malignant neoplasm of upper-outer quadrant of left female breast: Secondary | ICD-10-CM | POA: Diagnosis not present

## 2022-05-03 LAB — RAD ONC ARIA SESSION SUMMARY
Course Elapsed Days: 2
Plan Fractions Treated to Date: 3
Plan Prescribed Dose Per Fraction: 2.67 Gy
Plan Total Fractions Prescribed: 16
Plan Total Prescribed Dose: 42.72 Gy
Reference Point Dosage Given to Date: 8.01 Gy
Reference Point Session Dosage Given: 2.67 Gy
Session Number: 3

## 2022-05-04 ENCOUNTER — Other Ambulatory Visit: Payer: Self-pay

## 2022-05-04 ENCOUNTER — Ambulatory Visit
Admission: RE | Admit: 2022-05-04 | Discharge: 2022-05-04 | Disposition: A | Discharge status: Home / Self Care | Payer: Medicare HMO | Source: Ambulatory Visit | Attending: Radiation Oncology | Admitting: Radiation Oncology

## 2022-05-04 DIAGNOSIS — Z170 Estrogen receptor positive status [ER+]: Secondary | ICD-10-CM | POA: Diagnosis not present

## 2022-05-04 DIAGNOSIS — C50412 Malignant neoplasm of upper-outer quadrant of left female breast: Secondary | ICD-10-CM | POA: Diagnosis not present

## 2022-05-04 DIAGNOSIS — Z510 Encounter for antineoplastic radiation therapy: Secondary | ICD-10-CM | POA: Diagnosis not present

## 2022-05-04 LAB — RAD ONC ARIA SESSION SUMMARY
Course Elapsed Days: 3
Plan Fractions Treated to Date: 4
Plan Prescribed Dose Per Fraction: 2.67 Gy
Plan Total Fractions Prescribed: 16
Plan Total Prescribed Dose: 42.72 Gy
Reference Point Dosage Given to Date: 10.68 Gy
Reference Point Session Dosage Given: 2.67 Gy
Session Number: 4

## 2022-05-07 ENCOUNTER — Other Ambulatory Visit: Payer: Self-pay

## 2022-05-07 ENCOUNTER — Ambulatory Visit
Admission: RE | Admit: 2022-05-07 | Discharge: 2022-05-07 | Disposition: A | Discharge status: Home / Self Care | Payer: Medicare HMO | Source: Ambulatory Visit | Attending: Radiation Oncology | Admitting: Radiation Oncology

## 2022-05-07 DIAGNOSIS — Z170 Estrogen receptor positive status [ER+]: Secondary | ICD-10-CM | POA: Diagnosis not present

## 2022-05-07 DIAGNOSIS — Z510 Encounter for antineoplastic radiation therapy: Secondary | ICD-10-CM | POA: Diagnosis not present

## 2022-05-07 DIAGNOSIS — C50412 Malignant neoplasm of upper-outer quadrant of left female breast: Secondary | ICD-10-CM | POA: Diagnosis not present

## 2022-05-07 LAB — RAD ONC ARIA SESSION SUMMARY
Course Elapsed Days: 6
Plan Fractions Treated to Date: 5
Plan Prescribed Dose Per Fraction: 2.67 Gy
Plan Total Fractions Prescribed: 16
Plan Total Prescribed Dose: 42.72 Gy
Reference Point Dosage Given to Date: 13.35 Gy
Reference Point Session Dosage Given: 2.67 Gy
Session Number: 5

## 2022-05-08 ENCOUNTER — Ambulatory Visit
Admission: RE | Admit: 2022-05-08 | Discharge: 2022-05-08 | Disposition: A | Discharge status: Home / Self Care | Payer: Medicare HMO | Source: Ambulatory Visit | Attending: Radiation Oncology | Admitting: Radiation Oncology

## 2022-05-08 ENCOUNTER — Other Ambulatory Visit: Payer: Self-pay

## 2022-05-08 DIAGNOSIS — Z170 Estrogen receptor positive status [ER+]: Secondary | ICD-10-CM | POA: Diagnosis not present

## 2022-05-08 DIAGNOSIS — C50412 Malignant neoplasm of upper-outer quadrant of left female breast: Secondary | ICD-10-CM | POA: Diagnosis not present

## 2022-05-08 DIAGNOSIS — Z510 Encounter for antineoplastic radiation therapy: Secondary | ICD-10-CM | POA: Diagnosis not present

## 2022-05-08 LAB — RAD ONC ARIA SESSION SUMMARY
Course Elapsed Days: 7
Plan Fractions Treated to Date: 6
Plan Prescribed Dose Per Fraction: 2.67 Gy
Plan Total Fractions Prescribed: 16
Plan Total Prescribed Dose: 42.72 Gy
Reference Point Dosage Given to Date: 16.02 Gy
Reference Point Session Dosage Given: 2.67 Gy
Session Number: 6

## 2022-05-09 ENCOUNTER — Other Ambulatory Visit: Payer: Self-pay

## 2022-05-09 ENCOUNTER — Ambulatory Visit
Admission: RE | Admit: 2022-05-09 | Discharge: 2022-05-09 | Disposition: A | Discharge status: Home / Self Care | Payer: Medicare HMO | Source: Ambulatory Visit | Attending: Radiation Oncology | Admitting: Radiation Oncology

## 2022-05-09 DIAGNOSIS — Z170 Estrogen receptor positive status [ER+]: Secondary | ICD-10-CM | POA: Diagnosis not present

## 2022-05-09 DIAGNOSIS — Z510 Encounter for antineoplastic radiation therapy: Secondary | ICD-10-CM | POA: Diagnosis not present

## 2022-05-09 DIAGNOSIS — C50412 Malignant neoplasm of upper-outer quadrant of left female breast: Secondary | ICD-10-CM | POA: Diagnosis not present

## 2022-05-09 LAB — RAD ONC ARIA SESSION SUMMARY
Course Elapsed Days: 8
Plan Fractions Treated to Date: 7
Plan Prescribed Dose Per Fraction: 2.67 Gy
Plan Total Fractions Prescribed: 16
Plan Total Prescribed Dose: 42.72 Gy
Reference Point Dosage Given to Date: 18.69 Gy
Reference Point Session Dosage Given: 2.67 Gy
Session Number: 7

## 2022-05-10 ENCOUNTER — Ambulatory Visit
Admission: RE | Admit: 2022-05-10 | Discharge: 2022-05-10 | Disposition: A | Discharge status: Home / Self Care | Payer: Medicare HMO | Source: Ambulatory Visit | Attending: Radiation Oncology | Admitting: Radiation Oncology

## 2022-05-10 ENCOUNTER — Other Ambulatory Visit: Payer: Self-pay

## 2022-05-10 DIAGNOSIS — Z170 Estrogen receptor positive status [ER+]: Secondary | ICD-10-CM | POA: Diagnosis not present

## 2022-05-10 DIAGNOSIS — C50412 Malignant neoplasm of upper-outer quadrant of left female breast: Secondary | ICD-10-CM | POA: Diagnosis not present

## 2022-05-10 DIAGNOSIS — Z510 Encounter for antineoplastic radiation therapy: Secondary | ICD-10-CM | POA: Diagnosis not present

## 2022-05-10 LAB — RAD ONC ARIA SESSION SUMMARY
Course Elapsed Days: 9
Plan Fractions Treated to Date: 8
Plan Prescribed Dose Per Fraction: 2.67 Gy
Plan Total Fractions Prescribed: 16
Plan Total Prescribed Dose: 42.72 Gy
Reference Point Dosage Given to Date: 21.36 Gy
Reference Point Session Dosage Given: 2.67 Gy
Session Number: 8

## 2022-05-11 ENCOUNTER — Ambulatory Visit
Admission: RE | Admit: 2022-05-11 | Discharge: 2022-05-11 | Disposition: A | Discharge status: Home / Self Care | Payer: Medicare HMO | Source: Ambulatory Visit | Attending: Radiation Oncology | Admitting: Radiation Oncology

## 2022-05-11 ENCOUNTER — Other Ambulatory Visit: Payer: Self-pay

## 2022-05-11 DIAGNOSIS — C50412 Malignant neoplasm of upper-outer quadrant of left female breast: Secondary | ICD-10-CM | POA: Diagnosis not present

## 2022-05-11 DIAGNOSIS — Z510 Encounter for antineoplastic radiation therapy: Secondary | ICD-10-CM | POA: Diagnosis not present

## 2022-05-11 DIAGNOSIS — Z170 Estrogen receptor positive status [ER+]: Secondary | ICD-10-CM | POA: Diagnosis not present

## 2022-05-11 LAB — RAD ONC ARIA SESSION SUMMARY
Course Elapsed Days: 10
Plan Fractions Treated to Date: 9
Plan Prescribed Dose Per Fraction: 2.67 Gy
Plan Total Fractions Prescribed: 16
Plan Total Prescribed Dose: 42.72 Gy
Reference Point Dosage Given to Date: 24.03 Gy
Reference Point Session Dosage Given: 2.67 Gy
Session Number: 9

## 2022-05-14 ENCOUNTER — Ambulatory Visit
Admission: RE | Admit: 2022-05-14 | Discharge: 2022-05-14 | Disposition: A | Discharge status: Home / Self Care | Payer: Medicare HMO | Source: Ambulatory Visit | Attending: Radiation Oncology | Admitting: Radiation Oncology

## 2022-05-14 ENCOUNTER — Other Ambulatory Visit: Payer: Self-pay

## 2022-05-14 DIAGNOSIS — Z170 Estrogen receptor positive status [ER+]: Secondary | ICD-10-CM | POA: Diagnosis not present

## 2022-05-14 DIAGNOSIS — Z510 Encounter for antineoplastic radiation therapy: Secondary | ICD-10-CM | POA: Diagnosis not present

## 2022-05-14 DIAGNOSIS — C50412 Malignant neoplasm of upper-outer quadrant of left female breast: Secondary | ICD-10-CM | POA: Diagnosis not present

## 2022-05-14 LAB — RAD ONC ARIA SESSION SUMMARY
Course Elapsed Days: 13
Plan Fractions Treated to Date: 10
Plan Prescribed Dose Per Fraction: 2.67 Gy
Plan Total Fractions Prescribed: 16
Plan Total Prescribed Dose: 42.72 Gy
Reference Point Dosage Given to Date: 26.7 Gy
Reference Point Session Dosage Given: 2.67 Gy
Session Number: 10

## 2022-05-15 ENCOUNTER — Other Ambulatory Visit: Payer: Self-pay

## 2022-05-15 ENCOUNTER — Ambulatory Visit
Admission: RE | Admit: 2022-05-15 | Discharge: 2022-05-15 | Disposition: A | Discharge status: Home / Self Care | Payer: Medicare HMO | Source: Ambulatory Visit | Attending: Radiation Oncology | Admitting: Radiation Oncology

## 2022-05-15 ENCOUNTER — Ambulatory Visit: Payer: Medicare HMO

## 2022-05-15 DIAGNOSIS — Z510 Encounter for antineoplastic radiation therapy: Secondary | ICD-10-CM | POA: Diagnosis not present

## 2022-05-15 DIAGNOSIS — Z170 Estrogen receptor positive status [ER+]: Secondary | ICD-10-CM | POA: Diagnosis not present

## 2022-05-15 DIAGNOSIS — C50412 Malignant neoplasm of upper-outer quadrant of left female breast: Secondary | ICD-10-CM | POA: Diagnosis not present

## 2022-05-15 LAB — RAD ONC ARIA SESSION SUMMARY
Course Elapsed Days: 14
Plan Fractions Treated to Date: 11
Plan Prescribed Dose Per Fraction: 2.67 Gy
Plan Total Fractions Prescribed: 16
Plan Total Prescribed Dose: 42.72 Gy
Reference Point Dosage Given to Date: 29.37 Gy
Reference Point Session Dosage Given: 2.67 Gy
Session Number: 11

## 2022-05-16 ENCOUNTER — Other Ambulatory Visit: Payer: Self-pay

## 2022-05-16 ENCOUNTER — Ambulatory Visit
Admission: RE | Admit: 2022-05-16 | Discharge: 2022-05-16 | Disposition: A | Discharge status: Home / Self Care | Payer: Medicare HMO | Source: Ambulatory Visit | Attending: Radiation Oncology | Admitting: Radiation Oncology

## 2022-05-16 DIAGNOSIS — Z170 Estrogen receptor positive status [ER+]: Secondary | ICD-10-CM | POA: Diagnosis not present

## 2022-05-16 DIAGNOSIS — C50412 Malignant neoplasm of upper-outer quadrant of left female breast: Secondary | ICD-10-CM | POA: Diagnosis not present

## 2022-05-16 LAB — RAD ONC ARIA SESSION SUMMARY
Course Elapsed Days: 15
Plan Fractions Treated to Date: 12
Plan Prescribed Dose Per Fraction: 2.67 Gy
Plan Total Fractions Prescribed: 16
Plan Total Prescribed Dose: 42.72 Gy
Reference Point Dosage Given to Date: 32.04 Gy
Reference Point Session Dosage Given: 2.67 Gy
Session Number: 12

## 2022-05-17 ENCOUNTER — Other Ambulatory Visit: Payer: Self-pay

## 2022-05-17 ENCOUNTER — Ambulatory Visit
Admission: RE | Admit: 2022-05-17 | Discharge: 2022-05-17 | Disposition: A | Discharge status: Home / Self Care | Payer: Medicare HMO | Source: Ambulatory Visit | Attending: Radiation Oncology | Admitting: Radiation Oncology

## 2022-05-17 DIAGNOSIS — C50412 Malignant neoplasm of upper-outer quadrant of left female breast: Secondary | ICD-10-CM | POA: Diagnosis not present

## 2022-05-17 DIAGNOSIS — Z510 Encounter for antineoplastic radiation therapy: Secondary | ICD-10-CM | POA: Diagnosis not present

## 2022-05-17 DIAGNOSIS — Z170 Estrogen receptor positive status [ER+]: Secondary | ICD-10-CM | POA: Diagnosis not present

## 2022-05-17 LAB — RAD ONC ARIA SESSION SUMMARY
Course Elapsed Days: 16
Plan Fractions Treated to Date: 13
Plan Prescribed Dose Per Fraction: 2.67 Gy
Plan Total Fractions Prescribed: 16
Plan Total Prescribed Dose: 42.72 Gy
Reference Point Dosage Given to Date: 34.71 Gy
Reference Point Session Dosage Given: 2.67 Gy
Session Number: 13

## 2022-05-18 ENCOUNTER — Ambulatory Visit
Admission: RE | Admit: 2022-05-18 | Discharge: 2022-05-18 | Disposition: A | Discharge status: Home / Self Care | Payer: Medicare HMO | Source: Ambulatory Visit | Attending: Radiation Oncology | Admitting: Radiation Oncology

## 2022-05-18 ENCOUNTER — Other Ambulatory Visit: Payer: Self-pay

## 2022-05-18 DIAGNOSIS — Z510 Encounter for antineoplastic radiation therapy: Secondary | ICD-10-CM | POA: Diagnosis not present

## 2022-05-18 DIAGNOSIS — Z170 Estrogen receptor positive status [ER+]: Secondary | ICD-10-CM | POA: Diagnosis not present

## 2022-05-18 DIAGNOSIS — C50412 Malignant neoplasm of upper-outer quadrant of left female breast: Secondary | ICD-10-CM | POA: Diagnosis not present

## 2022-05-18 LAB — RAD ONC ARIA SESSION SUMMARY
Course Elapsed Days: 17
Plan Fractions Treated to Date: 14
Plan Prescribed Dose Per Fraction: 2.67 Gy
Plan Total Fractions Prescribed: 16
Plan Total Prescribed Dose: 42.72 Gy
Reference Point Dosage Given to Date: 37.38 Gy
Reference Point Session Dosage Given: 2.67 Gy
Session Number: 14

## 2022-05-20 NOTE — Progress Notes (Signed)
Patient Care Team: Claretta Fraise, MD as PCP - General (Family Medicine) Jovita Kussmaul, MD as Consulting Physician (General Surgery) Nicholas Lose, MD as Consulting Physician (Hematology and Oncology) Gery Pray, MD as Consulting Physician (Radiation Oncology) Mauro Kaufmann, RN as Oncology Nurse Navigator Rockwell Germany, RN as Oncology Nurse Navigator  DIAGNOSIS: No diagnosis found.  SUMMARY OF ONCOLOGIC HISTORY: Oncology History  Malignant neoplasm of upper-outer quadrant of left breast in female, estrogen receptor positive ([place])  02/21/2022 Initial Diagnosis   Screening mammogram detected left breast asymmetry mass detected on ultrasound at 3 o'clock position measuring 0.7 cm, axilla negative, biopsy revealed grade 2 IDC with DCIS ER 95%, PR 90%, HER2 equivocal by IHC, FISH negative, Ki-67 3%   02/28/2022 Cancer Staging   Staging form: Breast, AJCC 8th Edition - Clinical: Stage IA (cT1b, cN0, cM0, G2, ER+, PR+, HER2-) - Signed by Nicholas Lose, MD on 02/28/2022 Stage prefix: Initial diagnosis Histologic grading system: 3 grade system    Genetic Testing   [facility] CustomNext Panel was Negative. Report date is 03/19/2022.  The CustomNext-Cancer+RNAinsight panel offered by [facility] includes sequencing and rearrangement analysis for the following 49 genes:  APC, ATM, AXIN2, BARD1, BMPR1A, BRCA1, BRCA2, BRIP1, CDH1, CDK4, CDKN2A, CHEK2, CTNNA1, DICER1, EPCAM, GREM1, HOXB13, KIT, MEN1, MLH1, MSH2, MSH3, MSH6, MUTYH, NBN, NF1, NTHL1, PALB2, PDGFRA, PMS2, POLD1, POLE, PRKAR1A, PTEN, RAD50, RAD51C, RAD51D, RET, SDHA, SDHB, SDHC, SDHD, SMAD4, SMARCA4, STK11, TP53, TSC1, TSC2, and VHL.  RNA data is routinely analyzed for use in variant interpretation for all genes.   03/22/2022 Surgery   Left lumpectomy: 9 mm grade 2 IDC with DCIS with clear margins 0/2 lymph nodes negative ER 95%, PR 90%, HER2 2+ by IHC negative by FISH, Ki-67 3%   03/22/2022 Oncotype testing   21/7%     CHIEF  COMPLIANT: Follow-up Oncotype DX test result    INTERVAL HISTORY: Morgan Schmidt is a 71 y.o. female is here because of recent diagnosis of left breast mass. She presents to the clinic today for a follow-up.   ALLERGIES:  is allergic to lipitor [atorvastatin calcium] and ace inhibitors.  MEDICATIONS:  Current Outpatient Medications  Medication Sig Dispense Refill   Accu-Chek FastClix Lancets MISC Use to check Blood Sugars twice daily. Dx E11.8 204 each 3   acetaminophen (TYLENOL) 325 MG tablet Take 325-650 mg by mouth every 6 (six) hours as needed (for pain or headaches).     Alcohol Swabs (B-D SINGLE USE SWABS REGULAR) PADS Use to check Blood Sugars twice daily. Dx E11.8 200 each 3   Blood Glucose Calibration (ACCU-CHEK AVIVA) SOLN Use to check Blood Sugars twice daily. Dx E11.8 1 each 3   Blood Glucose Monitoring Suppl (ACCU-CHEK AVIVA PLUS) w/Device KIT Use to check Blood Sugars twice daily. Dx E11.8 1 kit 0   Cholecalciferol (VITAMIN D) 2000 UNITS tablet Take 1 tablet (2,000 Units total) by mouth daily. (Patient taking differently: Take 2,000 Units by mouth every evening.) 30 tablet 11   diclofenac sodium (VOLTAREN) 1 % GEL Apply 2-4 g topically See admin instructions. Apply 2-4 grams to each hand up to 4 times a day as needed for arthritis in the fingers (Patient taking differently: Apply 2-4 g topically See admin instructions. Apply 2-4 grams to each hand up to 4 times a day as needed for arthritis in the fingers) 100 g 11   docusate sodium (COLACE) 100 MG capsule Take 1 capsule (100 mg total) by mouth 2 (two) times  daily. 180 capsule 3   Dulaglutide (TRULICITY) 4.5 AC/1.6SA SOPN Inject 4.5 mg as directed once a week. Monday's 6.5 mL 3   glucose blood (ACCU-CHEK AVIVA PLUS) test strip TEST BLOOD SUGAR TWICE DAILY Dx E11.8 200 strip 3   insulin glargine (LANTUS SOLOSTAR) 100 UNIT/ML Solostar Pen Inject 110 Units into the skin daily after breakfast. 105 mL 3   Insulin Pen Needle (DROPLET  PEN NEEDLES) 31G X 8 MM MISC Use as direceted daily Dx E11.8 100 each 3   levothyroxine (SYNTHROID) 112 MCG tablet Take 1 tablet (112 mcg total) by mouth daily. DOSE CHANGE 90 tablet 3   metFORMIN (GLUCOPHAGE) 1000 MG tablet Take 1 tablet (1,000 mg total) by mouth 2 (two) times daily with a meal. 180 tablet 3   olmesartan (BENICAR) 40 MG tablet Take 1 tablet (40 mg total) by mouth daily. 30 tablet 0   rosuvastatin (CRESTOR) 20 MG tablet TAKE 1 TABLET EVERY DAY FOR CHOLESTEROL 90 tablet 0   No current facility-administered medications for this visit.    PHYSICAL EXAMINATION: ECOG PERFORMANCE STATUS: {CHL ONC ECOG PS:[phone]}  There were no vitals filed for this visit. There were no vitals filed for this visit.  BREAST:*** No palpable masses or nodules in either right or left breasts. No palpable axillary supraclavicular or infraclavicular adenopathy no breast tenderness or nipple discharge. (exam performed in the presence of a chaperone)  LABORATORY DATA:  I have reviewed the data as listed    Latest Ref Rng & Units 04/02/2022    1:15 PM 02/28/2022   12:42 PM 12/06/2021    9:05 AM  CMP  Glucose 70 - 99 mg/dL 138  190  89   BUN 8 - 27 mg/dL 10  11  7    Creatinine 0.57 - 1.00 mg/dL 0.57  0.71  0.63   Sodium 134 - 144 mmol/L 140  138  142   Potassium 3.5 - 5.2 mmol/L 4.7  3.9  4.0   Chloride 96 - 106 mmol/L 100  102  103   CO2 20 - 29 mmol/L 26  29  25    Calcium 8.7 - 10.3 mg/dL 9.0  8.8  8.1   Total Protein 6.0 - 8.5 g/dL 6.6  6.8  6.6   Total Bilirubin 0.0 - 1.2 mg/dL 0.6  0.8  0.5   Alkaline Phos 44 - 121 IU/L 86  70  71   AST 0 - 40 IU/L 19  18  16    ALT 0 - 32 IU/L 16  19  14      Lab Results  Component Value Date   WBC 6.8 04/02/2022   HGB 13.6 04/02/2022   HCT 41.5 04/02/2022   MCV 83 04/02/2022   PLT 221 04/02/2022   NEUTROABS 4.1 04/02/2022    ASSESSMENT & PLAN:  No problem-specific Assessment & Plan notes found for this encounter.    No orders of the  defined types were placed in this encounter.  The patient has a good understanding of the overall plan. she agrees with it. she will call with any problems that may develop before the next visit here. Total time spent: 30 mins including face to face time and time spent for planning, charting and co-ordination of care   Suzzette Righter, [place] 05/20/22    I Gardiner Coins am scribing for Dr. Lindi Adie  ***

## 2022-05-21 ENCOUNTER — Other Ambulatory Visit: Payer: Self-pay

## 2022-05-21 ENCOUNTER — Ambulatory Visit
Admission: RE | Admit: 2022-05-21 | Discharge: 2022-05-21 | Disposition: A | Discharge status: Home / Self Care | Payer: Medicare HMO | Source: Ambulatory Visit | Attending: Radiation Oncology | Admitting: Radiation Oncology

## 2022-05-21 DIAGNOSIS — Z170 Estrogen receptor positive status [ER+]: Secondary | ICD-10-CM | POA: Insufficient documentation

## 2022-05-21 DIAGNOSIS — C50412 Malignant neoplasm of upper-outer quadrant of left female breast: Secondary | ICD-10-CM | POA: Insufficient documentation

## 2022-05-21 DIAGNOSIS — Z510 Encounter for antineoplastic radiation therapy: Secondary | ICD-10-CM | POA: Diagnosis not present

## 2022-05-21 LAB — RAD ONC ARIA SESSION SUMMARY
Course Elapsed Days: 20
Plan Fractions Treated to Date: 15
Plan Prescribed Dose Per Fraction: 2.67 Gy
Plan Total Fractions Prescribed: 16
Plan Total Prescribed Dose: 42.72 Gy
Reference Point Dosage Given to Date: 40.05 Gy
Reference Point Session Dosage Given: 2.67 Gy
Session Number: 15

## 2022-05-22 ENCOUNTER — Ambulatory Visit: Payer: Medicare HMO

## 2022-05-22 ENCOUNTER — Encounter: Payer: Self-pay | Admitting: *Deleted

## 2022-05-22 ENCOUNTER — Ambulatory Visit
Admission: RE | Admit: 2022-05-22 | Discharge: 2022-05-22 | Disposition: A | Discharge status: Home / Self Care | Payer: Medicare HMO | Source: Ambulatory Visit | Attending: Radiation Oncology | Admitting: Radiation Oncology

## 2022-05-22 ENCOUNTER — Other Ambulatory Visit: Payer: Self-pay

## 2022-05-22 ENCOUNTER — Inpatient Hospital Stay: Payer: Medicare HMO | Attending: Hematology and Oncology | Admitting: Hematology and Oncology

## 2022-05-22 ENCOUNTER — Encounter: Payer: Self-pay | Admitting: Radiation Oncology

## 2022-05-22 VITALS — BP 148/65 | HR 86 | Temp 98.1°F | Resp 18 | Ht 59.0 in | Wt 171.8 lb

## 2022-05-22 DIAGNOSIS — C50412 Malignant neoplasm of upper-outer quadrant of left female breast: Secondary | ICD-10-CM | POA: Insufficient documentation

## 2022-05-22 DIAGNOSIS — R232 Flushing: Secondary | ICD-10-CM | POA: Insufficient documentation

## 2022-05-22 DIAGNOSIS — Z87891 Personal history of nicotine dependence: Secondary | ICD-10-CM | POA: Insufficient documentation

## 2022-05-22 DIAGNOSIS — Z780 Asymptomatic menopausal state: Secondary | ICD-10-CM

## 2022-05-22 DIAGNOSIS — Z170 Estrogen receptor positive status [ER+]: Secondary | ICD-10-CM | POA: Insufficient documentation

## 2022-05-22 DIAGNOSIS — Z510 Encounter for antineoplastic radiation therapy: Secondary | ICD-10-CM | POA: Diagnosis not present

## 2022-05-22 LAB — RAD ONC ARIA SESSION SUMMARY
Course Elapsed Days: 21
Plan Fractions Treated to Date: 16
Plan Prescribed Dose Per Fraction: 2.67 Gy
Plan Total Fractions Prescribed: 16
Plan Total Prescribed Dose: 42.72 Gy
Reference Point Dosage Given to Date: 42.72 Gy
Reference Point Session Dosage Given: 2.67 Gy
Session Number: 16

## 2022-05-22 MED ORDER — ANASTROZOLE 1 MG PO TABS: 1.0000 mg | ORAL_TABLET | Freq: Every day | ORAL | 3 refills | Status: DC

## 2022-05-22 NOTE — Assessment & Plan Note (Signed)
03/22/2022:Left lumpectomy: 9 mm grade 2 IDC with DCIS with clear margins 0/2 lymph nodes negative ER 95%, PR 90%, HER2 2+ by IHC negative by FISH, Ki-67 3% Oncotype DX recurrence score: 21 (risk of distant recurrence: 7%)  Treatment plan: 1.  adjuvant radiation 05/02/2022-05/22/2022 3.  Adjuvant antiestrogen therapy  Anastrozole counseling: We discussed the risks and benefits of anti-estrogen therapy with aromatase inhibitors. These include but not limited to insomnia, hot flashes, mood changes, vaginal dryness, bone density loss, and weight gain. We strongly believe that the benefits far outweigh the risks. Patient understands these risks and consented to starting treatment. Planned treatment duration is 7 years.  Return to clinic in 3 months for survivorship care plan visit  Return to clinic in 3 months for survivorship care plan visit

## 2022-06-08 ENCOUNTER — Telehealth: Payer: Self-pay | Admitting: Family Medicine

## 2022-06-08 NOTE — Telephone Encounter (Signed)
Patient needs a DEXA scan. Please call back to schedule

## 2022-06-12 NOTE — Telephone Encounter (Signed)
Called lmtcb 

## 2022-06-13 ENCOUNTER — Ambulatory Visit (INDEPENDENT_AMBULATORY_CARE_PROVIDER_SITE_OTHER): Payer: Medicare HMO

## 2022-06-13 ENCOUNTER — Other Ambulatory Visit: Payer: Self-pay | Admitting: Family Medicine

## 2022-06-13 DIAGNOSIS — M810 Age-related osteoporosis without current pathological fracture: Secondary | ICD-10-CM | POA: Diagnosis not present

## 2022-06-13 DIAGNOSIS — Z780 Asymptomatic menopausal state: Secondary | ICD-10-CM

## 2022-06-14 NOTE — Progress Notes (Signed)
DEXA shows osteopenia. I recommend weekly fosamax. ?Nurse, if pt. Is agreeable, send in Fosamax 70 mg weekly, #13. ? ?Thanks, ?WS ?

## 2022-06-14 NOTE — Telephone Encounter (Signed)
Patient had done

## 2022-06-19 ENCOUNTER — Other Ambulatory Visit: Payer: Self-pay | Admitting: Family Medicine

## 2022-06-19 MED ORDER — ALENDRONATE SODIUM 70 MG PO TABS: 70.0000 mg | ORAL_TABLET | ORAL | 3 refills | Status: DC

## 2022-06-20 ENCOUNTER — Encounter: Payer: Self-pay | Admitting: Radiation Oncology

## 2022-06-23 NOTE — Progress Notes (Incomplete)
  Radiation Oncology         [phone] ________________________________  Patient Name: Morgan Schmidt MRN: [id number] DOB: [birth year] Referring Physician: Claretta Fraise (Profile Not Attached) Date of Service: 05/22/2022 [facility], [place]                                                        End Of Treatment Note  Diagnoses: C50.412-Malignant neoplasm of upper-outer quadrant of left female breast  Cancer Staging: S/p lumpectomy and SLN bx's: Stage IA (cT1b, cN0, cM0) Left Breast UOQ, Invasive Ductal Carcinoma, ER+ / PR+ / Her2-, Grade 2   Intent: Curative  Radiation Treatment Dates: 05/01/2022 through 05/22/2022 Site Technique Total Dose (Gy) Dose per Fx (Gy) Completed Fx Beam Energies  Breast, Left: Breast_L 3D 42.72/42.72 2.67 16/16 10X   Narrative: The patient tolerated radiation therapy relatively well. During her final weekly treatment check on 05/22/22, the patient reported fatigue and nausea over that past weekend. Physical exam performed on that same date showed some mild hyperpigmentation changes to the left breast area.  Some radiation dermatitis in the upper inner aspect of the breast was also appreciated. No skin breakdown was seen.    Plan: The patient will follow-up with radiation oncology in one month .  ________________________________________________ -----------------------------------  Blair Promise, PhD, MD  This document serves as a record of services personally performed by Gery Pray, MD. It was created on his behalf by Roney Mans, a trained medical scribe. The creation of this record is based on the scribe's personal observations and the provider's statements to them. This document has been checked and approved by the attending provider.

## 2022-06-23 NOTE — Progress Notes (Signed)
Radiation Oncology         [phone] ________________________________  Name: Morgan Schmidt MRN: [id number]  Date: 06/25/2022  DOB: June 21, 1951  Follow-Up Visit Note  CC: Claretta Fraise, MD  Claretta Fraise, MD  No diagnosis found.  Diagnosis: S/p lumpectomy and SLN bx's: Stage IA (cT1b, cN0, cM0) Left Breast UOQ, Invasive Ductal Carcinoma, ER+ / PR+ / Her2-, Grade 2    Interval Since Last Radiation: 1 month and 3 days   Intent: Curative  Radiation Treatment Dates: 05/01/2022 through 05/22/2022 Site Technique Total Dose (Gy) Dose per Fx (Gy) Completed Fx Beam Energies  Breast, Left: Breast_L 3D 42.72/42.72 2.67 16/16 10X   Narrative:  The patient returns today for routine follow-up. The patient tolerated radiation therapy relatively well. During her final weekly treatment check on 05/22/22, the patient reported fatigue and nausea over that past weekend. Physical exam performed on that same date showed some mild hyperpigmentation changes to the left breast area. Some radiation dermatitis in the upper inner aspect of the breast was also appreciated. No skin breakdown was seen.      Since completing XRT, the patient followed up with Dr. Lindi Adie on 05/22/22. During which time, the patient consented to proceed with anastrozole x 7 years.    Pertinent imaging performed in the interval includes a DXA for bone density study on 06/13/22 which showed findings consistent with osteoporosis.    ***                          Allergies:  is allergic to lipitor [atorvastatin calcium] and ace inhibitors.  Meds: Current Outpatient Medications  Medication Sig Dispense Refill   Accu-Chek FastClix Lancets MISC Use to check Blood Sugars twice daily. Dx E11.8 204 each 3   acetaminophen (TYLENOL) 325 MG tablet Take 325-650 mg by mouth every 6 (six) hours as needed (for pain or headaches).     Alcohol Swabs (B-D SINGLE USE SWABS REGULAR) PADS Use to check Blood Sugars twice daily. Dx E11.8 200 each 3    alendronate (FOSAMAX) 70 MG tablet Take 1 tablet (70 mg total) by mouth every 7 (seven) days. Take with a full glass of water on an empty stomach. Do not lay down for at least 2 hours 13 tablet 3   anastrozole (ARIMIDEX) 1 MG tablet Take 1 tablet (1 mg total) by mouth daily. 90 tablet 3   Blood Glucose Calibration (ACCU-CHEK AVIVA) SOLN Use to check Blood Sugars twice daily. Dx E11.8 1 each 3   Blood Glucose Monitoring Suppl (ACCU-CHEK AVIVA PLUS) w/Device KIT Use to check Blood Sugars twice daily. Dx E11.8 1 kit 0   Cholecalciferol (VITAMIN D) 2000 UNITS tablet Take 1 tablet (2,000 Units total) by mouth daily. (Patient taking differently: Take 2,000 Units by mouth every evening.) 30 tablet 11   diclofenac sodium (VOLTAREN) 1 % GEL Apply 2-4 g topically See admin instructions. Apply 2-4 grams to each hand up to 4 times a day as needed for arthritis in the fingers (Patient taking differently: Apply 2-4 g topically See admin instructions. Apply 2-4 grams to each hand up to 4 times a day as needed for arthritis in the fingers) 100 g 11   docusate sodium (COLACE) 100 MG capsule Take 1 capsule (100 mg total) by mouth 2 (two) times daily. 180 capsule 3   Dulaglutide (TRULICITY) 4.5 MP/5.3IR SOPN Inject 4.5 mg as directed once a week. Monday's 6.5 mL 3   glucose blood (  ACCU-CHEK AVIVA PLUS) test strip TEST BLOOD SUGAR TWICE DAILY Dx E11.8 200 strip 3   insulin glargine (LANTUS SOLOSTAR) 100 UNIT/ML Solostar Pen Inject 110 Units into the skin daily after breakfast. 105 mL 3   Insulin Pen Needle (DROPLET PEN NEEDLES) 31G X 8 MM MISC Use as direceted daily Dx E11.8 100 each 3   levothyroxine (SYNTHROID) 112 MCG tablet Take 1 tablet (112 mcg total) by mouth daily. DOSE CHANGE 90 tablet 3   metFORMIN (GLUCOPHAGE) 1000 MG tablet Take 1 tablet (1,000 mg total) by mouth 2 (two) times daily with a meal. 180 tablet 3   olmesartan (BENICAR) 40 MG tablet Take 1 tablet (40 mg total) by mouth daily. 30 tablet 0    rosuvastatin (CRESTOR) 20 MG tablet TAKE 1 TABLET EVERY DAY FOR CHOLESTEROL 90 tablet 0   No current facility-administered medications for this encounter.    Physical Findings: The patient is in no acute distress. Patient is alert and oriented.  vitals were not taken for this visit. .  No significant changes. Lungs are clear to auscultation bilaterally. Heart has regular rate and rhythm. No palpable cervical, supraclavicular, or axillary adenopathy. Abdomen soft, non-tender, normal bowel sounds.  Right Breast: no palpable mass, nipple discharge or bleeding. Left Breast: ***   Lab Findings: Lab Results  Component Value Date   WBC 6.8 04/02/2022   HGB 13.6 04/02/2022   HCT 41.5 04/02/2022   MCV 83 04/02/2022   PLT 221 04/02/2022    Radiographic Findings: DG [facility] DEXA  Result Date: 06/14/2022 EXAM: DUAL X-RAY ABSORPTIOMETRY (DXA) FOR BONE MINERAL DENSITY 06/13/2022 5:49 pm CLINICAL DATA:  71 year old Female Postmenopausal. post menopausal History of hip fracture. TECHNIQUE: An axial (e.g., hips, spine) and/or appendicular (e.g., radius) exam was performed, as appropriate, using GE Nature conservation officer at [place]. Images are obtained for bone mineral density measurement and are not obtained for diagnostic purposes. [id number] Exclusions: Left hip due to surgery. COMPARISON:  05/31/2020 FINDINGS: Scan quality: Good. LUMBAR SPINE (L1-L4): BMD (in g/cm2): 1.161 T-score: -0.3 Z-score: 1.0 Rate of change from previous exam: 7.3 % RIGHT FEMORAL NECK: BMD (in g/cm2): 0.689 T-score: -2.5 Z-score: -1.1 RIGHT TOTAL HIP: BMD (in g/cm2): 0.849 T-score: -1.3 Z-score: -0.2 Rate of change from previous exam: No significant rate of change from previous exam. LEFT FOREARM (RADIUS 33%): BMD (in g/cm2): 0.940 T-score: 0.6 Z-score: 2.3 Rate of change from previous exam: No significant rate of change from previous exam. FRAX 10-YEAR PROBABILITY OF FRACTURE: Patient does not  meet criteria for FRAX assessment. IMPRESSION: Osteoporosis based on BMD. Fracture risk is increased. Increased risk is based on low BMD, and history of hip fracture. RECOMMENDATIONS: 1. All patients should optimize calcium and vitamin D intake. 2. Consider FDA-approved medical therapies in postmenopausal women and men aged 97 years and older, based on the following: - A hip or vertebral (clinical or morphometric) fracture - T-score less than or equal to -2.5 and secondary causes have been excluded. - Low bone mass (T-score between -1.0 and -2.5) and a 10-year probability of a hip fracture greater than or equal to 3% or a 10-year probability of a major osteoporosis-related fracture greater than or equal to 20% based on the [place]-adapted WHO algorithm. - Clinician judgment and/or patient preferences may indicate treatment for people with 10-year fracture probabilities above or below these levels 3. Patients with diagnosis of osteoporosis or at high risk for fracture should have regular bone mineral density tests. For  patients eligible for Medicare, routine testing is allowed once every 2 years. The testing frequency can be increased to one year for patients who have rapidly progressing disease, those who are receiving or discontinuing medical therapy to restore bone mass, or have additional risk factors. Electronically Signed   By: Margarette Canada M.D.   On: 06/14/2022 08:01    Impression:  S/p lumpectomy and SLN bx's: Stage IA (cT1b, cN0, cM0) Left Breast UOQ, Invasive Ductal Carcinoma, ER+ / PR+ / Her2-, Grade 2    The patient is recovering from the effects of radiation.  ***  Plan:  ***   *** minutes of total time was spent for this patient encounter, including preparation, face-to-face counseling with the patient and coordination of care, physical exam, and documentation of the encounter. ____________________________________  Blair Promise, PhD, MD  This document serves as a record of services personally  performed by Gery Pray, MD. It was created on his behalf by Roney Mans, a trained medical scribe. The creation of this record is based on the scribe's personal observations and the provider's statements to them. This document has been checked and approved by the attending provider.

## 2022-06-25 ENCOUNTER — Ambulatory Visit
Admission: RE | Admit: 2022-06-25 | Discharge: 2022-06-25 | Disposition: A | Discharge status: Home / Self Care | Payer: Medicare HMO | Source: Ambulatory Visit | Attending: Radiation Oncology | Admitting: Radiation Oncology

## 2022-06-25 ENCOUNTER — Encounter: Payer: Self-pay | Admitting: Radiation Oncology

## 2022-06-25 VITALS — BP 142/62 | HR 81 | Temp 98.3°F | Resp 18 | Ht 59.0 in | Wt 174.2 lb

## 2022-06-25 DIAGNOSIS — L598 Other specified disorders of the skin and subcutaneous tissue related to radiation: Secondary | ICD-10-CM | POA: Insufficient documentation

## 2022-06-25 DIAGNOSIS — Z79899 Other long term (current) drug therapy: Secondary | ICD-10-CM | POA: Insufficient documentation

## 2022-06-25 DIAGNOSIS — Z7985 Long-term (current) use of injectable non-insulin antidiabetic drugs: Secondary | ICD-10-CM | POA: Diagnosis not present

## 2022-06-25 DIAGNOSIS — Z7984 Long term (current) use of oral hypoglycemic drugs: Secondary | ICD-10-CM | POA: Insufficient documentation

## 2022-06-25 DIAGNOSIS — Z79811 Long term (current) use of aromatase inhibitors: Secondary | ICD-10-CM | POA: Insufficient documentation

## 2022-06-25 DIAGNOSIS — R110 Nausea: Secondary | ICD-10-CM | POA: Diagnosis not present

## 2022-06-25 DIAGNOSIS — Z923 Personal history of irradiation: Secondary | ICD-10-CM | POA: Diagnosis not present

## 2022-06-25 DIAGNOSIS — R5383 Other fatigue: Secondary | ICD-10-CM | POA: Insufficient documentation

## 2022-06-25 DIAGNOSIS — Z170 Estrogen receptor positive status [ER+]: Secondary | ICD-10-CM | POA: Diagnosis not present

## 2022-06-25 DIAGNOSIS — M810 Age-related osteoporosis without current pathological fracture: Secondary | ICD-10-CM | POA: Diagnosis not present

## 2022-06-25 DIAGNOSIS — C50412 Malignant neoplasm of upper-outer quadrant of left female breast: Secondary | ICD-10-CM | POA: Insufficient documentation

## 2022-06-25 HISTORY — DX: Personal history of irradiation: Z92.3

## 2022-06-25 NOTE — Progress Notes (Signed)
Morgan Schmidt is here today for follow up post radiation to the breast.   Breast Side:Left   They completed their radiation on: 05/22/22   Does the patient complain of any of the following: Post radiation skin issues: No Breast Tenderness: No Breast Swelling: No Lymphadema: No Range of Motion limitations: No Fatigue post radiation: Yes Appetite good/fair/poor: Good  Additional comments if applicable: Patient reports having a bone density scan last week.  BP (!) 142/62 (BP Location: Left Arm, Patient Position: Sitting)   Pulse 81   Temp 98.3 F (36.8 C) (Oral)   Resp 18   Ht '4\' 11"'$  (1.499 m)   Wt 174 lb 4 oz (79 kg)   LMP 03/02/2004   SpO2 97%   BMI 35.19 kg/m

## 2022-07-02 ENCOUNTER — Ambulatory Visit: Payer: Medicare HMO | Attending: Hematology and Oncology

## 2022-07-02 VITALS — Wt 171.2 lb

## 2022-07-02 DIAGNOSIS — Z483 Aftercare following surgery for neoplasm: Secondary | ICD-10-CM | POA: Insufficient documentation

## 2022-07-02 NOTE — Therapy (Signed)
OUTPATIENT PHYSICAL THERAPY SOZO SCREENING NOTE   Patient Name: Morgan Schmidt MRN: [id number] DOB:1950-10-17, 71 y.o., female Today's Date: 07/02/2022  PCP: Claretta Fraise, MD REFERRING PROVIDER: Nicholas Lose, MD   PT End of Session - 07/02/22 [phone]     Visit Number 2   # unchanged due to screen only   PT Start Time 3748    PT Stop Time 2707    PT Time Calculation (min) 4 min    Activity Tolerance Patient tolerated treatment well    Behavior During Therapy [facility] for tasks assessed/performed             Past Medical History:  Diagnosis Date   AIN (anal intraepithelial neoplasia) anal canal    Cancer ([place]) 03/15/2022   breast   History of adenomatous polyp of colon    History of radiation therapy    Left breast- 05/01/22-05/22/22- Dr. Gery Pray   History of thyroid cancer 06/2008   s/p  total bilateral thyroidectomy for symptomatic multinodular goiter;   06-30-2008 s/p total thyroidectomy -- papillary thyroid cancer  and treated w/ RAI post surgery 09-29-2008   (last scan negative for recurrence in epic 03/ 2011)   Hyperlipidemia    Hypertension    Hypothyroidism, postsurgical 06/2008   and had RAI post surgery 02/ 2010;   followed by pcp   OA (osteoarthritis)    Type 2 diabetes mellitus treated with insulin ([place])    followed by pcp   (11-23-2021  per pt checks daily in am and at times in afternoon,  fasting sugar--- 95--110)   Wears dentures    upper   Past Surgical History:  Procedure Laterality Date   ANAL INTRAEPITHELIAL NEOPLASIA EXCISION N/A 11/30/2021   Procedure: EXCISIONAL BIOPSY ANAL LESIONS;  Surgeon: Leighton Ruff, MD;  Location: [place];  Service: General;  Laterality: N/A;   BREAST LUMPECTOMY WITH RADIOACTIVE SEED AND SENTINEL LYMPH NODE BIOPSY Left 03/22/2022   Procedure: LEFT BREAST LUMPECTOMY WITH RADIOACTIVE SEED AND SENTINEL LYMPH NODE BIOPSY;  Surgeon: Jovita Kussmaul, MD;  Location: [place];  Service: General;   Laterality: Left;  [place]   W/  BILATERAL TUBAL LIGATION   COLONOSCOPY WITH PROPOFOL  11/10/2021   by Dr.Pyrtle   HIGH RESOLUTION ANOSCOPY N/A 11/30/2021   Procedure: HIGH RESOLUTION ANOSCOPY;  Surgeon: Leighton Ruff, MD;  Location: [facility];  Service: General;  Laterality: N/A;   HIP PINNING,CANNULATED Left 03/10/2018   Procedure: CANNULATED HIP PINNING;  Surgeon: Paralee Cancel, MD;  Location: [place];  Service: Orthopedics;  Laterality: Left;   KNEE ARTHROSCOPY Left 10/22/2016   Procedure: ARTHROSCOPY LEFT KNEE WITH DEBRIDEMENT;  Surgeon: Paralee Cancel, MD;  Location: [facility];  Service: Orthopedics;  Laterality: Left;   KNEE ARTHROSCOPY WITH MEDIAL MENISECTOMY  10/22/2016   Procedure: KNEE ARTHROSCOPY WITH MEDIAL MENISECTOMY;  Surgeon: Paralee Cancel, MD;  Location: [facility];  Service: Orthopedics;;   KNEE ARTHROSCOPY WITH SUBCHONDROPLASTY Left 10/22/2016   Procedure: KNEE ARTHROSCOPY WITH MEDIAL CHONDROPLASTY;  Surgeon: Paralee Cancel, MD;  Location[facility];  Service: Orthopedics;  Laterality: Left;   TOTAL THYROIDECTOMY Bilateral 06/30/2008   _0  by dr Excell Seltzer   Patient Active Problem List   Diagnosis Date Noted   Genetic testing 03/20/2022   Malignant neoplasm of upper-outer quadrant of left breast in female, estrogen receptor positive ([place]) 02/26/2022   Pain in left knee 07/01/2018  Controlled diabetes mellitus type 2 with complications ([place]) 16/05/9603   Essential hypertension 03/23/2016   Other specified hypothyroidism 12/16/2014   Mixed diabetic hyperlipidemia associated with type 2 diabetes mellitus ([place]) 12/16/2014   Long term current use of insulin ([place]) 12/16/2014   Polyposis of colon 12/16/2014    REFERRING DIAG: left breast cancer at risk for lymphedema  THERAPY DIAG:  Aftercare following surgery for neoplasm  PERTINENT HISTORY: Patient was diagnosed on 02/21/22  with left grade 2. It measures 0.7 cm and is located in the upper-outer quadrant. It is ER+, PR+, HER2- with a Ki67 of 3%. Hx of rectal cancer and surgery in 12/2021. 03/22/22 L breast lumpectomy and SLNB 0/2   PRECAUTIONS: left UE Lymphedema risk, None  SUBJECTIVE: Pt returns for her first 3 month L-Dex screen.   PAIN:  Are you having pain? No  SOZO SCREENING: Patient was assessed today using the SOZO machine to determine the lymphedema index score. This was compared to her baseline score. It was determined that she is within the recommended range when compared to her baseline and no further action is needed at this time. She will continue SOZO screenings. These are done every 3 months for 2 years post operatively followed by every 6 months for 2 years, and then annually.   L-DEX FLOWSHEETS - 07/02/22 0900       L-DEX LYMPHEDEMA SCREENING   Measurement Type Unilateral    L-DEX MEASUREMENT EXTREMITY Upper Extremity    POSITION  Standing    DOMINANT SIDE Right    At Risk Side Left    BASELINE SCORE (UNILATERAL) 0    L-DEX SCORE (UNILATERAL) 2.4    VALUE CHANGE (UNILAT) 2.4               Otelia Limes, PTA 07/02/2022, 9:55 AM

## 2022-07-03 ENCOUNTER — Ambulatory Visit: Payer: Medicare HMO | Admitting: Family Medicine

## 2022-07-11 ENCOUNTER — Ambulatory Visit (INDEPENDENT_AMBULATORY_CARE_PROVIDER_SITE_OTHER): Payer: Medicare HMO | Admitting: Family Medicine

## 2022-07-11 ENCOUNTER — Encounter: Payer: Self-pay | Admitting: Family Medicine

## 2022-07-11 VITALS — BP 144/61 | HR 72 | Temp 97.6°F | Ht 59.0 in | Wt 170.8 lb

## 2022-07-11 DIAGNOSIS — E118 Type 2 diabetes mellitus with unspecified complications: Secondary | ICD-10-CM

## 2022-07-11 DIAGNOSIS — I10 Essential (primary) hypertension: Secondary | ICD-10-CM

## 2022-07-11 DIAGNOSIS — E039 Hypothyroidism, unspecified: Secondary | ICD-10-CM

## 2022-07-11 DIAGNOSIS — Z23 Encounter for immunization: Secondary | ICD-10-CM

## 2022-07-11 DIAGNOSIS — Z794 Long term (current) use of insulin: Secondary | ICD-10-CM | POA: Diagnosis not present

## 2022-07-11 DIAGNOSIS — E782 Mixed hyperlipidemia: Secondary | ICD-10-CM

## 2022-07-11 DIAGNOSIS — E1169 Type 2 diabetes mellitus with other specified complication: Secondary | ICD-10-CM | POA: Diagnosis not present

## 2022-07-11 LAB — BAYER DCA HB A1C WAIVED: HB A1C (BAYER DCA - WAIVED): 6.9 % — ABNORMAL HIGH (ref 4.8–5.6)

## 2022-07-11 MED ORDER — ROSUVASTATIN CALCIUM 20 MG PO TABS: ORAL_TABLET | ORAL | 2 refills | Status: DC

## 2022-07-11 MED ORDER — LANTUS SOLOSTAR 100 UNIT/ML ~~LOC~~ SOPN: 110.0000 [IU] | PEN_INJECTOR | Freq: Every day | SUBCUTANEOUS | 3 refills | Status: DC

## 2022-07-11 NOTE — Progress Notes (Signed)
Subjective:  [name],  female    DOB: 08/24/1950  Age: 71 y.o.    CC: Medical Management of Chronic Issues   HPI Morgan Schmidt presents for  follow-up of hypertension. Patient has no history of headache chest pain or shortness of breath or recent cough. Patient also denies symptoms of TIA such as numbness weakness lateralizing. Patient denies side effects from medication. States taking it regularly.  Patient also  in for follow-up of elevated cholesterol. Doing well without complaints on current medication. Denies side effects  including myalgia and arthralgia and nausea. Also in today for liver function testing. Currently no chest pain, shortness of breath or other cardiovascular related symptoms noted.  Follow-up of diabetes. Patient does check blood sugar at home. Readings run between 90 fasting and 160 prandial  Patient denies symptoms such as excessive hunger or urinary frequency, excessive hunger, nausea No significant hypoglycemic spells noted. Medications reviewed. Pt reports taking them regularly. Pt. denies complication/adverse reaction today.    follow-up on  thyroid. The patient has a history of hypothyroidism for many years. It has been stable recently. Pt. denies any change in  voice, loss of hair, heat intolerance. Gets cold easily when sitting. Warms whe active.  Energy level has been adequatebut tires out easily some days. Patient denies constipation and diarrhea. No myxedema. Medication is as noted below. Verified that pt is taking it daily on an empty stomach. Well tolerated.    History Morgan Schmidt has a past medical history of AIN (anal intraepithelial neoplasia) anal canal, Cancer ([place]) (03/15/2022), History of adenomatous polyp of colon, History of radiation therapy, History of thyroid cancer (06/2008), Hyperlipidemia, Hypertension, Hypothyroidism, postsurgical (06/2008), OA (osteoarthritis), Type 2 diabetes mellitus treated with insulin ([place]), and  Wears dentures.   Morgan Schmidt has a past surgical history that includes Total thyroidectomy (Bilateral, 06/30/2008); Cesarean section (1985); Knee arthroscopy (Left, 10/22/2016); Knee arthroscopy with medial menisectomy (10/22/2016); Knee arthroscopy with subchondroplasty (Left, 10/22/2016); Hip pinning, cannulated (Left, 03/10/2018); Colonoscopy with propofol (11/10/2021); Anal intraepithelial neoplasia excision (N/A, 11/30/2021); High resolution anoscopy (N/A, 11/30/2021); and Breast lumpectomy with radioactive seed and sentinel lymph node biopsy (Left, 03/22/2022).   Her family history includes Arthritis in her father; Heart disease in her mother.Morgan Schmidt reports that Morgan Schmidt quit smoking about 21 years ago. Her smoking use included cigarettes. Morgan Schmidt has a 2.50 pack-year smoking history. Morgan Schmidt has never used smokeless tobacco. Morgan Schmidt reports that Morgan Schmidt does not drink alcohol and does not use drugs.  Current Outpatient Medications on File Prior to Visit  Medication Sig Dispense Refill   Accu-Chek FastClix Lancets MISC Use to check Blood Sugars twice daily. Dx E11.8 204 each 3   acetaminophen (TYLENOL) 325 MG tablet Take 325-650 mg by mouth every 6 (six) hours as needed (for pain or headaches).     Alcohol Swabs (B-D SINGLE USE SWABS REGULAR) PADS Use to check Blood Sugars twice daily. Dx E11.8 200 each 3   alendronate (FOSAMAX) 70 MG tablet Take 1 tablet (70 mg total) by mouth every 7 (seven) days. Take with a full glass of water on an empty stomach. Do not lay down for at least 2 hours 13 tablet 3   anastrozole (ARIMIDEX) 1 MG tablet Take 1 tablet (1 mg total) by mouth daily. 90 tablet 3   Blood Glucose Calibration (ACCU-CHEK AVIVA) SOLN Use to check Blood Sugars twice daily. Dx E11.8 1 each 3   Blood Glucose Monitoring Suppl (ACCU-CHEK AVIVA PLUS) w/Device KIT Use to check Blood  Sugars twice daily. Dx E11.8 1 kit 0   Cholecalciferol (VITAMIN D) 2000 UNITS tablet Take 1 tablet (2,000 Units total) by mouth daily. (Patient taking  differently: Take 2,000 Units by mouth every evening.) 30 tablet 11   diclofenac sodium (VOLTAREN) 1 % GEL Apply 2-4 g topically See admin instructions. Apply 2-4 grams to each hand up to 4 times a day as needed for arthritis in the fingers (Patient taking differently: Apply 2-4 g topically See admin instructions. Apply 2-4 grams to each hand up to 4 times a day as needed for arthritis in the fingers) 100 g 11   docusate sodium (COLACE) 100 MG capsule Take 1 capsule (100 mg total) by mouth 2 (two) times daily. 180 capsule 3   Dulaglutide (TRULICITY) 4.5 ZJ/6.7HA SOPN Inject 4.5 mg as directed once a week. Monday's 6.5 mL 3   glucose blood (ACCU-CHEK AVIVA PLUS) test strip TEST BLOOD SUGAR TWICE DAILY Dx E11.8 200 strip 3   Insulin Pen Needle (DROPLET PEN NEEDLES) 31G X 8 MM MISC Use as direceted daily Dx E11.8 100 each 3   levothyroxine (SYNTHROID) 112 MCG tablet Take 1 tablet (112 mcg total) by mouth daily. DOSE CHANGE 90 tablet 3   metFORMIN (GLUCOPHAGE) 1000 MG tablet Take 1 tablet (1,000 mg total) by mouth 2 (two) times daily with a meal. 180 tablet 3   olmesartan (BENICAR) 40 MG tablet Take 1 tablet (40 mg total) by mouth daily. 30 tablet 0   No current facility-administered medications on file prior to visit.    ROS Review of Systems  Constitutional: Negative.   HENT: Negative.    Eyes:  Negative for visual disturbance.  Respiratory:  Negative for shortness of breath.   Cardiovascular:  Negative for chest pain.  Gastrointestinal:  Negative for abdominal pain.  Musculoskeletal:  Negative for arthralgias.    Objective:  BP (!) 144/61   Pulse 72   Temp 97.6 F (36.4 C)   Ht 4' 11" (1.499 m)   Wt 170 lb 12.8 oz (77.5 kg)   LMP 03/02/2004   SpO2 98%   BMI 34.50 kg/m   BP Readings from Last 3 Encounters:  07/11/22 (!) 144/61  06/25/22 (!) 142/62  05/22/22 (!) 148/65    Wt Readings from Last 3 Encounters:  07/11/22 170 lb 12.8 oz (77.5 kg)  07/02/22 171 lb 4 oz (77.7 kg)   06/25/22 174 lb 4 oz (79 kg)     Physical Exam Constitutional:      General: Morgan Schmidt is not in acute distress.    Appearance: Morgan Schmidt is well-developed.  Cardiovascular:     Rate and Rhythm: Normal rate and regular rhythm.  Pulmonary:     Breath sounds: Normal breath sounds.  Musculoskeletal:        General: Normal range of motion.  Skin:    General: Skin is warm and dry.  Neurological:     Mental Status: Morgan Schmidt is alert and oriented to person, place, and time.     Diabetic Foot Exam - Simple   Simple Foot Form Diabetic Foot exam was performed with the following findings: Yes 07/11/2022 10:32 AM  Visual Inspection No deformities, no ulcerations, no other skin breakdown bilaterally: Yes Sensation Testing Intact to touch and monofilament testing bilaterally: Yes Pulse Check Posterior Tibialis and Dorsalis pulse intact bilaterally: Yes Comments     Lab Results  Component Value Date   HGBA1C 7.5 (H) 04/02/2022   HGBA1C 7.4 (H) 12/06/2021   HGBA1C 7.6 (H)  09/07/2021    Assessment & Plan:   Morgan Schmidt was seen today for medical management of chronic issues.  Diagnoses and all orders for this visit:  Essential hypertension -     CBC with Differential/Platelet -     CMP14+EGFR  Controlled type 2 diabetes mellitus with complication, with long-term current use of insulin (HCC) -     Bayer DCA Hb A1c Waived -     insulin glargine (LANTUS SOLOSTAR) 100 UNIT/ML Solostar Pen; Inject 110 Units into the skin daily after breakfast. -     Microalbumin / creatinine urine ratio  Hypothyroidism, unspecified type -     TSH + free T4  Mixed diabetic hyperlipidemia associated with type 2 diabetes mellitus (HCC) -     Lipid panel -     rosuvastatin (CRESTOR) 20 MG tablet; TAKE 1 TABLET EVERY DAY FOR CHOLESTEROL   I am having Salomon Mast. Pennings maintain her Vitamin D, acetaminophen, diclofenac sodium, Accu-Chek Aviva Plus, Accu-Chek Aviva, B-D SINGLE USE SWABS REGULAR, Accu-Chek FastClix  Lancets, Droplet Pen Needles, docusate sodium, olmesartan, Accu-Chek Aviva Plus, metFORMIN, Trulicity, levothyroxine, anastrozole, alendronate, Lantus SoloStar, and rosuvastatin.  Meds ordered this encounter  Medications   insulin glargine (LANTUS SOLOSTAR) 100 UNIT/ML Solostar Pen    Sig: Inject 110 Units into the skin daily after breakfast.    Dispense:  105 mL    Refill:  3   rosuvastatin (CRESTOR) 20 MG tablet    Sig: TAKE 1 TABLET EVERY DAY FOR CHOLESTEROL    Dispense:  90 tablet    Refill:  2     Follow-up: Return in about 3 months (around 10/11/2022).  Claretta Fraise, M.D.

## 2022-07-11 NOTE — Addendum Note (Signed)
Addended by: Baldomero Lamy B on: 07/11/2022 01:44 PM   Modules accepted: Orders

## 2022-07-12 LAB — CBC WITH DIFFERENTIAL/PLATELET
Basophils Absolute: 0.1 10*3/uL (ref 0.0–0.2)
Basos: 1 %
EOS (ABSOLUTE): 0.1 10*3/uL (ref 0.0–0.4)
Eos: 1 %
Hematocrit: 38.1 % (ref 34.0–46.6)
Hemoglobin: 12.3 g/dL (ref 11.1–15.9)
Immature Grans (Abs): 0 10*3/uL (ref 0.0–0.1)
Immature Granulocytes: 0 %
Lymphocytes Absolute: 1 10*3/uL (ref 0.7–3.1)
Lymphs: 21 %
MCH: 27.3 pg (ref 26.6–33.0)
MCHC: 32.3 g/dL (ref 31.5–35.7)
MCV: 85 fL (ref 79–97)
Monocytes Absolute: 0.3 10*3/uL (ref 0.1–0.9)
Monocytes: 6 %
Neutrophils Absolute: 3.4 10*3/uL (ref 1.4–7.0)
Neutrophils: 71 %
Platelets: 195 10*3/uL (ref 150–450)
RBC: 4.5 x10E6/uL (ref 3.77–5.28)
RDW: 14.7 % (ref 11.7–15.4)
WBC: 4.9 10*3/uL (ref 3.4–10.8)

## 2022-07-12 LAB — CMP14+EGFR
ALT: 26 IU/L (ref 0–32)
AST: 31 IU/L (ref 0–40)
Albumin/Globulin Ratio: 2.3 — ABNORMAL HIGH (ref 1.2–2.2)
Albumin: 4.6 g/dL (ref 3.9–4.9)
Alkaline Phosphatase: 80 IU/L (ref 44–121)
BUN/Creatinine Ratio: 13 (ref 12–28)
BUN: 9 mg/dL (ref 8–27)
Bilirubin Total: 0.8 mg/dL (ref 0.0–1.2)
CO2: 25 mmol/L (ref 20–29)
Calcium: 8.5 mg/dL — ABNORMAL LOW (ref 8.7–10.3)
Chloride: 101 mmol/L (ref 96–106)
Creatinine, Ser: 0.68 mg/dL (ref 0.57–1.00)
Globulin, Total: 2 g/dL (ref 1.5–4.5)
Glucose: 88 mg/dL (ref 70–99)
Potassium: 4.4 mmol/L (ref 3.5–5.2)
Sodium: 140 mmol/L (ref 134–144)
Total Protein: 6.6 g/dL (ref 6.0–8.5)
eGFR: 94 mL/min/{1.73_m2} (ref >59)

## 2022-07-12 LAB — TSH+FREE T4
Free T4: 1.72 ng/dL (ref 0.82–1.77)
TSH: 0.44 u[IU]/mL — ABNORMAL LOW (ref 0.450–4.500)

## 2022-07-12 LAB — LIPID PANEL
Chol/HDL Ratio: 2.7 ratio (ref 0.0–4.4)
Cholesterol, Total: 116 mg/dL (ref 100–199)
HDL: 43 mg/dL (ref >39)
LDL Chol Calc (NIH): 46 mg/dL (ref 0–99)
Triglycerides: 157 mg/dL — ABNORMAL HIGH (ref 0–149)
VLDL Cholesterol Cal: 27 mg/dL (ref 5–40)

## 2022-07-13 LAB — MICROALBUMIN / CREATININE URINE RATIO
Creatinine, Urine: 119.4 mg/dL
Microalb/Creat Ratio: 10 mg/g creat (ref 0–29)
Microalbumin, Urine: 11.5 ug/mL

## 2022-07-16 DIAGNOSIS — D013 Carcinoma in situ of anus and anal canal: Secondary | ICD-10-CM | POA: Diagnosis not present

## 2022-07-16 NOTE — Progress Notes (Signed)
Hello Zenola,  Your lab result is normal and/or stable.Some minor variations that are not significant are commonly marked abnormal, but do not represent any medical problem for you.  Best regards, Veroncia Jezek, M.D.

## 2022-07-23 ENCOUNTER — Telehealth: Payer: Self-pay | Admitting: *Deleted

## 2022-07-23 NOTE — Telephone Encounter (Signed)
Received call from pt with complaint of ongoing right knee and right shoulder joint paint x several weeks. Per MD pt needing to stop Anastrozole x2 weeks to see if symptoms resolve.  If symptoms dissipate, pt to start Letrozole.  If symptoms continue, pt to resume Anastrozole and f/u with PCP.  Pt educated and verbalized understanding.

## 2022-07-27 ENCOUNTER — Other Ambulatory Visit: Payer: Self-pay | Admitting: Family Medicine

## 2022-07-27 DIAGNOSIS — Z794 Long term (current) use of insulin: Secondary | ICD-10-CM

## 2022-07-27 DIAGNOSIS — I10 Essential (primary) hypertension: Secondary | ICD-10-CM

## 2022-08-06 ENCOUNTER — Telehealth: Payer: Self-pay | Admitting: *Deleted

## 2022-08-06 MED ORDER — LETROZOLE 2.5 MG PO TABS: 2.5000 mg | ORAL_TABLET | Freq: Every day | ORAL | 0 refills | Status: DC

## 2022-08-06 NOTE — Telephone Encounter (Signed)
Received call from pt stating she has stopped anastrozole x2 weeks and ongoing right knee and right shoulder joint paint has diminished. Verbal orders received from MD for pt to start Letrozole 2.5 mg tablet daily and f/u in office in 1 month.  Prescription sent to pharmacy on file, pt educated and verbalized understanding.

## 2022-08-22 ENCOUNTER — Encounter: Payer: Self-pay | Admitting: Adult Health

## 2022-08-22 ENCOUNTER — Inpatient Hospital Stay: Payer: Medicare HMO | Attending: Hematology and Oncology | Admitting: Adult Health

## 2022-08-22 VITALS — BP 143/51 | HR 93 | Temp 98.5°F | Resp 16 | Ht 59.0 in | Wt 176.2 lb

## 2022-08-22 DIAGNOSIS — Z79811 Long term (current) use of aromatase inhibitors: Secondary | ICD-10-CM | POA: Insufficient documentation

## 2022-08-22 DIAGNOSIS — M810 Age-related osteoporosis without current pathological fracture: Secondary | ICD-10-CM | POA: Insufficient documentation

## 2022-08-22 DIAGNOSIS — Z87891 Personal history of nicotine dependence: Secondary | ICD-10-CM | POA: Insufficient documentation

## 2022-08-22 DIAGNOSIS — Z923 Personal history of irradiation: Secondary | ICD-10-CM | POA: Insufficient documentation

## 2022-08-22 DIAGNOSIS — Z170 Estrogen receptor positive status [ER+]: Secondary | ICD-10-CM | POA: Diagnosis not present

## 2022-08-22 DIAGNOSIS — C50412 Malignant neoplasm of upper-outer quadrant of left female breast: Secondary | ICD-10-CM | POA: Diagnosis not present

## 2022-08-22 NOTE — Progress Notes (Signed)
SURVIVORSHIP VISIT:   BRIEF ONCOLOGIC HISTORY:  Oncology History  Malignant neoplasm of upper-outer quadrant of left breast in female, estrogen receptor positive ([facility])  02/21/2022 Initial Diagnosis   Screening mammogram detected left breast asymmetry mass detected on ultrasound at 3 o'clock position measuring 0.7 cm, axilla negative, biopsy revealed grade 2 IDC with DCIS ER 95%, PR 90%, HER2 equivocal by IHC, FISH negative, Ki-67 3%   02/28/2022 Cancer Staging   Staging form: Breast, AJCC 8th Edition - Clinical: Stage IA (cT1b, cN0, cM0, G2, ER+, PR+, HER2-) - Signed by Nicholas Lose, MD on 02/28/2022 Stage prefix: Initial diagnosis Histologic grading system: 3 grade system    Genetic Testing   [facility] CustomNext Panel was Negative. Report date is 03/19/2022.  The CustomNext-Cancer+RNAinsight panel offered by [facility] includes sequencing and rearrangement analysis for the following 49 genes:  APC, ATM, AXIN2, BARD1, BMPR1A, BRCA1, BRCA2, BRIP1, CDH1, CDK4, CDKN2A, CHEK2, CTNNA1, DICER1, EPCAM, GREM1, HOXB13, KIT, MEN1, MLH1, MSH2, MSH3, MSH6, MUTYH, NBN, NF1, NTHL1, PALB2, PDGFRA, PMS2, POLD1, POLE, PRKAR1A, PTEN, RAD50, RAD51C, RAD51D, RET, SDHA, SDHB, SDHC, SDHD, SMAD4, SMARCA4, STK11, TP53, TSC1, TSC2, and VHL.  RNA data is routinely analyzed for use in variant interpretation for all genes.   03/22/2022 Surgery   Left lumpectomy: 9 mm grade 2 IDC with DCIS with clear margins 0/2 lymph nodes negative ER 95%, PR 90%, HER2 2+ by IHC negative by FISH, Ki-67 3%   03/22/2022 Oncotype testing   21/7%   05/02/2022 - 05/22/2022 Radiation Therapy   Site Technique Total Dose (Gy) Dose per Fx (Gy) Completed Fx Beam Energies  Breast, Left: Breast_L 3D 42.72/42.72 2.67 16/16 10X     05/2022 -  Anti-estrogen oral therapy   Anastrozole     INTERVAL HISTORY:  Ms. Colborn to review her survivorship care plan detailing her treatment course for breast cancer, as well as monitoring long-term side effects  of that treatment, education regarding health maintenance, screening, and overall wellness and health promotion.     Overall, Ms. Galanti reports feeling quite well.  She changed from Anastrozole to Letrozole and is tolerating it well.  She denies any issues with it. She was recently discovered to have osteoporosis in 05/2022 and was started on weekly fosamax.  She is tolerating this medication well.   REVIEW OF SYSTEMS:  Review of Systems  Constitutional:  Negative for appetite change, chills, fatigue, fever and unexpected weight change.  HENT:   Negative for hearing loss, lump/mass and trouble swallowing.   Eyes:  Negative for eye problems and icterus.  Respiratory:  Negative for chest tightness, cough and shortness of breath.   Cardiovascular:  Negative for chest pain, leg swelling and palpitations.  Gastrointestinal:  Negative for abdominal distention, abdominal pain, constipation, diarrhea, nausea and vomiting.  Endocrine: Negative for hot flashes.  Genitourinary:  Negative for difficulty urinating.   Musculoskeletal:  Negative for arthralgias.  Skin:  Negative for itching and rash.  Neurological:  Negative for dizziness, extremity weakness, headaches and numbness.  Hematological:  Negative for adenopathy. Does not bruise/bleed easily.  Psychiatric/Behavioral:  Negative for depression. The patient is not nervous/anxious.    Breast: Denies any new nodularity, masses, tenderness, nipple changes, or nipple discharge.    PAST MEDICAL/SURGICAL HISTORY:  Past Medical History:  Diagnosis Date   AIN (anal intraepithelial neoplasia) anal canal    Cancer ([place]) 03/15/2022   breast   History of adenomatous polyp of colon    History of radiation therapy    Left  breast- 05/01/22-05/22/22- Dr. Gery Pray   History of thyroid cancer 06/2008   s/p  total bilateral thyroidectomy for symptomatic multinodular goiter;   06-30-2008 s/p total thyroidectomy -- papillary thyroid cancer  and treated w/  RAI post surgery 09-29-2008   (last scan negative for recurrence in epic 03/ 2011)   Hyperlipidemia    Hypertension    Hypothyroidism, postsurgical 06/2008   and had RAI post surgery 02/ 2010;   followed by pcp   OA (osteoarthritis)    Type 2 diabetes mellitus treated with insulin ([place])    followed by pcp   (11-23-2021  per pt checks daily in am and at times in afternoon,  fasting sugar--- 95--110)   Wears dentures    upper   Past Surgical History:  Procedure Laterality Date   ANAL INTRAEPITHELIAL NEOPLASIA EXCISION N/A 11/30/2021   Procedure: EXCISIONAL BIOPSY ANAL LESIONS;  Surgeon: Leighton Ruff, MD;  Location: [facility];  Service: General;  Laterality: N/A;   BREAST LUMPECTOMY WITH RADIOACTIVE SEED AND SENTINEL LYMPH NODE BIOPSY Left 03/22/2022   Procedure: LEFT BREAST LUMPECTOMY WITH RADIOACTIVE SEED AND SENTINEL LYMPH NODE BIOPSY;  Surgeon: Jovita Kussmaul, MD;  Location: [facility];  Service: General;  Laterality: Left;  [place]   W/  BILATERAL TUBAL LIGATION   COLONOSCOPY WITH PROPOFOL  11/10/2021   by Dr.Pyrtle   HIGH RESOLUTION ANOSCOPY N/A 11/30/2021   Procedure: HIGH RESOLUTION ANOSCOPY;  Surgeon: Leighton Ruff, MD;  Location: [facility];  Service: General;  Laterality: N/A;   HIP PINNING,CANNULATED Left 03/10/2018   Procedure: CANNULATED HIP PINNING;  Surgeon: Paralee Cancel, MD;  Location: [facility];  Service: Orthopedics;  Laterality: Left;   KNEE ARTHROSCOPY Left 10/22/2016   Procedure: ARTHROSCOPY LEFT KNEE WITH DEBRIDEMENT;  Surgeon: Paralee Cancel, MD;  Location: [facility];  Service: Orthopedics;  Laterality: Left;   KNEE ARTHROSCOPY WITH MEDIAL MENISECTOMY  10/22/2016   Procedure: KNEE ARTHROSCOPY WITH MEDIAL MENISECTOMY;  Surgeon: Paralee Cancel, MD;  Location: [facility];  Service: Orthopedics;;   KNEE ARTHROSCOPY WITH SUBCHONDROPLASTY Left 10/22/2016   Procedure:  KNEE ARTHROSCOPY WITH MEDIAL CHONDROPLASTY;  Surgeon: Paralee Cancel, MD;  Location[facility];  Service: Orthopedics;  Laterality: Left;   TOTAL THYROIDECTOMY Bilateral 06/30/2008   _0  by dr Excell Seltzer     ALLERGIES:  Allergies  Allergen Reactions   Lipitor [Atorvastatin Calcium] Swelling    Throat swelling   Ace Inhibitors Cough     CURRENT MEDICATIONS:  Outpatient Encounter Medications as of 08/22/2022  Medication Sig   Accu-Chek FastClix Lancets MISC Use to check Blood Sugars twice daily. Dx E11.8   acetaminophen (TYLENOL) 325 MG tablet Take 325-650 mg by mouth every 6 (six) hours as needed (for pain or headaches).   Alcohol Swabs (B-D SINGLE USE SWABS REGULAR) PADS Use to check Blood Sugars twice daily. Dx E11.8   alendronate (FOSAMAX) 70 MG tablet Take 1 tablet (70 mg total) by mouth every 7 (seven) days. Take with a full glass of water on an empty stomach. Do not lay down for at least 2 hours   Blood Glucose Calibration (ACCU-CHEK AVIVA) SOLN Use to check Blood Sugars twice daily. Dx E11.8   Blood Glucose Monitoring Suppl (ACCU-CHEK AVIVA PLUS) w/Device KIT Use to check Blood Sugars twice daily. Dx E11.8   Cholecalciferol (VITAMIN D) 2000 UNITS tablet Take 1 tablet (2,000 Units total) by mouth daily. (  Patient taking differently: Take 2,000 Units by mouth every evening.)   diclofenac sodium (VOLTAREN) 1 % GEL Apply 2-4 g topically See admin instructions. Apply 2-4 grams to each hand up to 4 times a day as needed for arthritis in the fingers (Patient taking differently: Apply 2-4 g topically See admin instructions. Apply 2-4 grams to each hand up to 4 times a day as needed for arthritis in the fingers)   docusate sodium (COLACE) 100 MG capsule Take 1 capsule (100 mg total) by mouth 2 (two) times daily.   Dulaglutide (TRULICITY) 4.5 DD/2.2GU SOPN Inject 4.5 mg as directed once a week. Monday's   glucose blood (ACCU-CHEK AVIVA PLUS) test strip TEST BLOOD SUGAR TWICE DAILY  Dx E11.8   insulin glargine (LANTUS SOLOSTAR) 100 UNIT/ML Solostar Pen Inject 110 Units into the skin daily after breakfast.   Insulin Pen Needle (DROPLET PEN NEEDLES) 31G X 8 MM MISC USE AS DIRECTED EVERY DAY Dx E11.8   letrozole (FEMARA) 2.5 MG tablet Take 1 tablet (2.5 mg total) by mouth daily.   levothyroxine (SYNTHROID) 112 MCG tablet Take 1 tablet (112 mcg total) by mouth daily. DOSE CHANGE   metFORMIN (GLUCOPHAGE) 1000 MG tablet Take 1 tablet (1,000 mg total) by mouth 2 (two) times daily with a meal.   olmesartan (BENICAR) 40 MG tablet Take 1 tablet (40 mg total) by mouth daily.   rosuvastatin (CRESTOR) 20 MG tablet TAKE 1 TABLET EVERY DAY FOR CHOLESTEROL   No facility-administered encounter medications on file as of 08/22/2022.     ONCOLOGIC FAMILY HISTORY:  Family History  Problem Relation Age of Onset   Heart disease Mother    Arthritis Father    Colon cancer Neg Hx    Esophageal cancer Neg Hx    Rectal cancer Neg Hx    Stomach cancer Neg Hx    Breast cancer Neg Hx    Colon polyps Neg Hx     \  SOCIAL HISTORY:  Social History   Socioeconomic History   Marital status: Significant Other    Spouse name: Not on file   Number of children: 3   Years of education: 12   Highest education level: High school graduate  Occupational History   Occupation: retired  Tobacco Use   Smoking status: Former    Packs/day: 0.50    Years: 5.00    Total pack years: 2.50    Types: Cigarettes    Quit date: 2002    Years since quitting: 22.0   Smokeless tobacco: Never  Vaping Use   Vaping Use: Never used  Substance and Sexual Activity   Alcohol use: No   Drug use: Never   Sexual activity: Not on file  Other Topics Concern   Not on file  Social History Narrative   Not on file   Social Determinants of Health   Financial Resource Strain: Low Risk  (09/29/2021)   Overall Financial Resource Strain (CARDIA)    Difficulty of Paying Living Expenses: Not hard at all  Food  Insecurity: No Food Insecurity (09/29/2021)   Hunger Vital Sign    Worried About Running Out of Food in the Last Year: Never true    Ran Out of Food in the Last Year: Never true  Transportation Needs: No Transportation Needs (09/29/2021)   PRAPARE - Hydrologist (Medical): No    Lack of Transportation (Non-Medical): No  Physical Activity: Insufficiently Active (09/29/2021)   Exercise Vital Sign  Days of Exercise per Week: 5 days    Minutes of Exercise per Session: 20 min  Stress: No Stress Concern Present (09/29/2021)   [place]    Feeling of Stress : Not at all  Social Connections: Socially Integrated (09/29/2021)   Social Connection and Isolation Panel [NHANES]    Frequency of Communication with Friends and Family: More than three times a week    Frequency of Social Gatherings with Friends and Family: More than three times a week    Attends Religious Services: More than 4 times per year    Active Member of [employer] or Organizations: Yes    Attends Archivist Meetings: More than 4 times per year    Marital Status: Living with partner  Intimate Partner Violence: Not At Risk (09/29/2021)   Humiliation, Afraid, Rape, and Kick questionnaire    Fear of Current or Ex-Partner: No    Emotionally Abused: No    Physically Abused: No    Sexually Abused: No     OBSERVATIONS/OBJECTIVE:  BP (!) 143/51 (BP Location: Left Arm, Patient Position: Sitting)   Pulse 93   Temp 98.5 F (36.9 C) (Temporal)   Resp 16   Ht _0  (1.499 m)   Wt 176 lb 3.2 oz (79.9 kg)   LMP 03/02/2004   SpO2 98%   BMI 35.59 kg/m  GENERAL: Patient is a well appearing female in no acute distress HEENT:  Sclerae anicteric.  Oropharynx clear and moist. No ulcerations or evidence of oropharyngeal candidiasis. Neck is supple.  NODES:  No cervical, supraclavicular, or axillary lymphadenopathy palpated.  BREAST EXAM: Left  breast status postlumpectomy and radiation no sign of local recurrence right breast is benign. LUNGS:  Clear to auscultation bilaterally.  No wheezes or rhonchi. HEART:  Regular rate and rhythm. No murmur appreciated. ABDOMEN:  Soft, nontender.  Positive, normoactive bowel sounds. No organomegaly palpated. MSK:  No focal spinal tenderness to palpation. Full range of motion bilaterally in the upper extremities. EXTREMITIES:  No peripheral edema.   SKIN:  Clear with no obvious rashes or skin changes. No nail dyscrasia. NEURO:  Nonfocal. Well oriented.  Appropriate affect.  LABORATORY DATA:  None for this visit.  DIAGNOSTIC IMAGING:  None for this visit.      ASSESSMENT AND PLAN:  Ms.. Clemenson is a pleasant 72 y.o. female with Stage 1A left breast invasive ductal carcinoma, ER+/PR+/HER2-, diagnosed in July 2023, treated with lumpectomy, adjuvant radiation therapy, and anti-estrogen therapy with anastrozole beginning in October 2023.  She presents to the Survivorship Clinic for our initial meeting and routine follow-up post-completion of treatment for breast cancer.    1. Stage 1A left breast cancer:  Ms. Weightman is continuing to recover from definitive treatment for breast cancer. She will follow-up with her medical oncologist, Dr. Lindi Adie in 6 months with history and physical exam per surveillance protocol.  She will continue her anti-estrogen therapy with Letrozole. Thus far, she is tolerating the Letrozole well, with minimal side effects. She was instructed to make Dr. Lindi Adie or myself aware if she begins to experience any worsening side effects of the medication and I could see her back in clinic to help manage those side effects, as needed. Her mammogram is due 01/2023; orders placed today. Today, a comprehensive survivorship care plan and treatment summary was reviewed with the patient today detailing her breast cancer diagnosis, treatment course, potential late/long-term effects of treatment,  appropriate follow-up care with recommendations  for the future, and patient education resources.  A copy of this summary, along with a letter will be sent to the patient's primary care provider via mail/fax/In Basket message after today's visit.    2. Bone health:  Given Ms. Mattera's age/history of breast cancer and her current treatment regimen including anti-estrogen therapy with Letrozole, she is at risk for bone demineralization.  Her last DEXA scan was 06/14/2022 and showed osteoporosis with a T score in the right femoral neck of -2.5 consistent with osteoporosis. She is taking fosamax weekly with good tolerance and will continue this.  She will repeat testing in 2 years.  She was given education on specific activities to promote bone health.  3. Cancer screening:  Due to Ms. Gittens's history and her age, she should receive screening for skin cancers, colon cancer, and gynecologic cancers.  The information and recommendations are listed on the patient's comprehensive care plan/treatment summary and were reviewed in detail with the patient.    4. Health maintenance and wellness promotion: Ms. Fogarty was encouraged to consume 5-7 servings of fruits and vegetables per day. We reviewed the "Nutrition Rainbow" handout.  She was also encouraged to engage in moderate to vigorous exercise for 30 minutes per day most days of the week. We discussed the [employer] fitness program, which is designed for cancer survivors to help them become more physically fit after cancer treatments.  She was instructed to limit her alcohol consumption and continue to abstain from tobacco use.     5. Support services/counseling: It is not uncommon for this period of the patient's cancer care trajectory to be one of many emotions and stressors. She was given information regarding our available services and encouraged to contact me with any questions or for help enrolling in any of our support group/programs.    Follow up  instructions:    -Return to cancer center in 6 months for f/u with Dr. Lindi Adie  -Mammogram due in 01/2023 -She is welcome to return back to the Survivorship Clinic at any time; no additional follow-up needed at this time.  -Consider referral back to survivorship as a long-term survivor for continued surveillance  The patient was provided an opportunity to ask questions and all were answered. The patient agreed with the plan and demonstrated an understanding of the instructions.   Total encounter time:40 minutes*in face-to-face visit time, chart review, lab review, care coordination, order entry, and documentation of the encounter time.    Wilber Bihari, NP 08/22/22 5:32 PM Medical Oncology and Hematology [facility] [place], [place] [address] Tel. [phone]    Fax. [phone]  *Total Encounter Time as defined by the Centers for Medicare and Medicaid Services includes, in addition to the face-to-face time of a patient visit (documented in the note above) non-face-to-face time: obtaining and reviewing outside history, ordering and reviewing medications, tests or procedures, care coordination (communications with other health care professionals or caregivers) and documentation in the medical record.

## 2022-08-24 ENCOUNTER — Telehealth: Payer: Self-pay | Admitting: Hematology and Oncology

## 2022-08-24 NOTE — Telephone Encounter (Signed)
Scheduled appointment per 1/3 los. Patient is aware.

## 2022-09-11 ENCOUNTER — Other Ambulatory Visit: Payer: Self-pay | Admitting: Family Medicine

## 2022-09-11 DIAGNOSIS — I10 Essential (primary) hypertension: Secondary | ICD-10-CM

## 2022-09-11 DIAGNOSIS — E118 Type 2 diabetes mellitus with unspecified complications: Secondary | ICD-10-CM

## 2022-09-11 DIAGNOSIS — E1169 Type 2 diabetes mellitus with other specified complication: Secondary | ICD-10-CM

## 2022-09-12 ENCOUNTER — Other Ambulatory Visit: Payer: Self-pay | Admitting: Hematology and Oncology

## 2022-09-12 ENCOUNTER — Telehealth: Payer: Self-pay | Admitting: *Deleted

## 2022-09-12 NOTE — Telephone Encounter (Signed)
Received call from pt with complaint of joint pain while on Letrozole.  Pt states joint pain is similar to what she experienced while on Anastrozole.  Per MD pt needing to stop Letrozole x2 weeks and f/u in office to discuss in further detail. Pt educated an verbalized understanding.

## 2022-10-02 ENCOUNTER — Ambulatory Visit (INDEPENDENT_AMBULATORY_CARE_PROVIDER_SITE_OTHER): Payer: Medicare HMO

## 2022-10-02 VITALS — Ht 59.0 in | Wt 170.0 lb

## 2022-10-02 DIAGNOSIS — Z0000 Encounter for general adult medical examination without abnormal findings: Secondary | ICD-10-CM | POA: Diagnosis not present

## 2022-10-02 NOTE — Progress Notes (Signed)
Subjective:   Morgan Schmidt is a 72 y.o. female who presents for Medicare Annual (Subsequent) preventive examination. I connected with  Morgan Schmidt on 10/02/22 by a audio enabled telemedicine application and verified that I am speaking with the correct person using two identifiers.  Patient Location: Home  Provider Location: Home Office  I discussed the limitations of evaluation and management by telemedicine. The patient expressed understanding and agreed to proceed.  Review of Systems     Cardiac Risk Factors include: advanced age (>41mn, >>74women);diabetes mellitus;dyslipidemia     Objective:    Today's Vitals   10/02/22 1156  Weight: 170 lb (77.1 kg)  Height: 4' 11"$  (1.499 m)   Body mass index is 34.34 kg/m.     10/02/2022   11:59 AM 06/25/2022   10:50 AM 05/22/2022   11:40 AM 04/18/2022    8:20 AM 04/03/2022    2:53 PM 03/22/2022    8:41 AM 03/15/2022   12:28 PM  Advanced Directives  Does Patient Have a Medical Advance Directive? No No No No No No No  Would patient like information on creating a medical advance directive? No - Patient declined No - Patient declined No - Patient declined  No - Patient declined No - Patient declined No - Patient declined    Current Medications (verified) Outpatient Encounter Medications as of 10/02/2022  Medication Sig   Accu-Chek FastClix Lancets MISC Use to check Blood Sugars twice daily. Dx E11.8   acetaminophen (TYLENOL) 325 MG tablet Take 325-650 mg by mouth every 6 (six) hours as needed (for pain or headaches).   Alcohol Swabs (B-D SINGLE USE SWABS REGULAR) PADS Use to check Blood Sugars twice daily. Dx E11.8   alendronate (FOSAMAX) 70 MG tablet Take 1 tablet (70 mg total) by mouth every 7 (seven) days. Take with a full glass of water on an empty stomach. Do not lay down for at least 2 hours   Blood Glucose Calibration (ACCU-CHEK AVIVA) SOLN Use to check Blood Sugars twice daily. Dx E11.8   Blood Glucose Monitoring Suppl  (ACCU-CHEK AVIVA PLUS) w/Device KIT Use to check Blood Sugars twice daily. Dx E11.8   Cholecalciferol (VITAMIN D) 2000 UNITS tablet Take 1 tablet (2,000 Units total) by mouth daily. (Patient taking differently: Take 2,000 Units by mouth every evening.)   diclofenac sodium (VOLTAREN) 1 % GEL Apply 2-4 g topically See admin instructions. Apply 2-4 grams to each hand up to 4 times a day as needed for arthritis in the fingers (Patient taking differently: Apply 2-4 g topically See admin instructions. Apply 2-4 grams to each hand up to 4 times a day as needed for arthritis in the fingers)   docusate sodium (COLACE) 100 MG capsule Take 1 capsule (100 mg total) by mouth 2 (two) times daily.   Dulaglutide (TRULICITY) 4.5 M0000000SOPN Inject 4.5 mg as directed once a week. Monday's   glucose blood (ACCU-CHEK AVIVA PLUS) test strip TEST BLOOD SUGAR TWICE DAILY Dx E11.8   insulin glargine (LANTUS SOLOSTAR) 100 UNIT/ML Solostar Pen Inject 110 Units into the skin daily after breakfast.   Insulin Pen Needle (DROPLET PEN NEEDLES) 31G X 8 MM MISC USE AS DIRECTED EVERY DAY Dx E11.8   letrozole (FEMARA) 2.5 MG tablet TAKE 1 TABLET EVERY DAY   levothyroxine (SYNTHROID) 112 MCG tablet Take 1 tablet (112 mcg total) by mouth daily. DOSE CHANGE   metFORMIN (GLUCOPHAGE) 1000 MG tablet Take 1 tablet (1,000 mg total) by mouth 2 (two)  times daily with a meal.   olmesartan (BENICAR) 40 MG tablet Take 1 tablet (40 mg total) by mouth daily.   rosuvastatin (CRESTOR) 20 MG tablet TAKE 1 TABLET EVERY DAY FOR CHOLESTEROL   No facility-administered encounter medications on file as of 10/02/2022.    Allergies (verified) Lipitor [atorvastatin calcium] and Ace inhibitors   History: Past Medical History:  Diagnosis Date   AIN (anal intraepithelial neoplasia) anal canal    Cancer ([place]) 03/15/2022   breast   History of adenomatous polyp of colon    History of radiation therapy    Left breast- 05/01/22-05/22/22- Dr. Gery Pray    History of thyroid cancer 06/2008   s/p  total bilateral thyroidectomy for symptomatic multinodular goiter;   06-30-2008 s/p total thyroidectomy -- papillary thyroid cancer  and treated w/ RAI post surgery 09-29-2008   (last scan negative for recurrence in epic 03/ 2011)   Hyperlipidemia    Hypertension    Hypothyroidism, postsurgical 06/2008   and had RAI post surgery 02/ 2010;   followed by pcp   OA (osteoarthritis)    Type 2 diabetes mellitus treated with insulin ([place])    followed by pcp   (11-23-2021  per pt checks daily in am and at times in afternoon,  fasting sugar--- 95--110)   Wears dentures    upper   Past Surgical History:  Procedure Laterality Date   ANAL INTRAEPITHELIAL NEOPLASIA EXCISION N/A 11/30/2021   Procedure: EXCISIONAL BIOPSY ANAL LESIONS;  Surgeon: Leighton Ruff, MD;  Location: [facility];  Service: General;  Laterality: N/A;   BREAST LUMPECTOMY WITH RADIOACTIVE SEED AND SENTINEL LYMPH NODE BIOPSY Left 03/22/2022   Procedure: LEFT BREAST LUMPECTOMY WITH RADIOACTIVE SEED AND SENTINEL LYMPH NODE BIOPSY;  Surgeon: Jovita Kussmaul, MD;  Location: [facility];  Service: General;  Laterality: Left;  [place]   W/  BILATERAL TUBAL LIGATION   COLONOSCOPY WITH PROPOFOL  11/10/2021   by Dr.Pyrtle   HIGH RESOLUTION ANOSCOPY N/A 11/30/2021   Procedure: HIGH RESOLUTION ANOSCOPY;  Surgeon: Leighton Ruff, MD;  Location: [facility];  Service: General;  Laterality: N/A;   HIP PINNING,CANNULATED Left 03/10/2018   Procedure: CANNULATED HIP PINNING;  Surgeon: Paralee Cancel, MD;  Location: [facility];  Service: Orthopedics;  Laterality: Left;   KNEE ARTHROSCOPY Left 10/22/2016   Procedure: ARTHROSCOPY LEFT KNEE WITH DEBRIDEMENT;  Surgeon: Paralee Cancel, MD;  Location: [facility];  Service: Orthopedics;  Laterality: Left;   KNEE ARTHROSCOPY WITH MEDIAL MENISECTOMY  10/22/2016   Procedure: KNEE  ARTHROSCOPY WITH MEDIAL MENISECTOMY;  Surgeon: Paralee Cancel, MD;  Location: [facility];  Service: Orthopedics;;   KNEE ARTHROSCOPY WITH SUBCHONDROPLASTY Left 10/22/2016   Procedure: KNEE ARTHROSCOPY WITH MEDIAL CHONDROPLASTY;  Surgeon: Paralee Cancel, MD;  Location[facility];  Service: Orthopedics;  Laterality: Left;   TOTAL THYROIDECTOMY Bilateral 06/30/2008   @[facility]$  by dr Excell Seltzer   Family History  Problem Relation Age of Onset   Heart disease Mother    Arthritis Father    Colon cancer Neg Hx    Esophageal cancer Neg Hx    Rectal cancer Neg Hx    Stomach cancer Neg Hx    Breast cancer Neg Hx    Colon polyps Neg Hx    Social History   Socioeconomic History   Marital status: Significant Other    Spouse name: Not on file   Number of  children: 3   Years of education: 12   Highest education level: High school graduate  Occupational History   Occupation: retired  Tobacco Use   Smoking status: Former    Packs/day: 0.50    Years: 5.00    Total pack years: 2.50    Types: Cigarettes    Quit date: 2002    Years since quitting: 22.1   Smokeless tobacco: Never  Vaping Use   Vaping Use: Never used  Substance and Sexual Activity   Alcohol use: No   Drug use: Never   Sexual activity: Not on file  Other Topics Concern   Not on file  Social History Narrative   Not on file   Social Determinants of Health   Financial Resource Strain: Low Risk  (10/02/2022)   Overall Financial Resource Strain (CARDIA)    Difficulty of Paying Living Expenses: Not hard at all  Food Insecurity: No Food Insecurity (10/02/2022)   Hunger Vital Sign    Worried About Running Out of Food in the Last Year: Never true    [place] in the Last Year: Never true  Transportation Needs: No Transportation Needs (10/02/2022)   PRAPARE - Hydrologist (Medical): No    Lack of Transportation (Non-Medical): No  Physical Activity: Insufficiently Active  (10/02/2022)   Exercise Vital Sign    Days of Exercise per Week: 3 days    Minutes of Exercise per Session: 30 min  Stress: No Stress Concern Present (10/02/2022)   [place]    Feeling of Stress : Not at all  Social Connections: Moderately Integrated (10/02/2022)   Social Connection and Isolation Panel [NHANES]    Frequency of Communication with Friends and Family: More than three times a week    Frequency of Social Gatherings with Friends and Family: More than three times a week    Attends Religious Services: Never    Marine scientist or Organizations: Yes    Attends Music therapist: 1 to 4 times per year    Marital Status: Living with partner    Tobacco Counseling Counseling given: Not Answered   Clinical Intake:  Pre-visit preparation completed: Yes  Pain : No/denies pain     Nutritional Risks: None Diabetes: Yes CBG done?: No Did pt. bring in CBG monitor from home?: No  How often do you need to have someone help you when you read instructions, pamphlets, or other written materials from your doctor or pharmacy?: 1 - Never  Diabetic?yes  Nutrition Risk Assessment:  Has the patient had any N/V/D within the last 2 months?  No  Does the patient have any non-healing wounds?  No  Has the patient had any unintentional weight loss or weight gain?  No   Diabetes:  Is the patient diabetic?  Yes  If diabetic, was a CBG obtained today?  No  Did the patient bring in their glucometer from home?  No  How often do you monitor your CBG's? 2 x week .   Financial Strains and Diabetes Management:  Are you having any financial strains with the device, your supplies or your medication? No .  Does the patient want to be seen by Chronic Care Management for management of their diabetes?  No  Would the patient like to be referred to a Nutritionist or for Diabetic Management?  No   Diabetic  Exams:  Diabetic Eye Exam: Completed 02/2022 Diabetic  Foot Exam: Overdue, Pt has been advised about the importance in completing this exam. Pt is scheduled for diabetic foot exam on next office visit .   Interpreter Needed?: No  Information entered by :: Jadene Pierini, LPN   Activities of Daily Living    10/02/2022   11:59 AM 03/22/2022    8:45 AM  In your present state of health, do you have any difficulty performing the following activities:  Hearing? 0 0  Vision? 0 0  Difficulty concentrating or making decisions? 0 0  Walking or climbing stairs? 0 0  Dressing or bathing? 0 0  Doing errands, shopping? 0   Preparing Food and eating ? N   Using the Toilet? N   In the past six months, have you accidently leaked urine? N   Do you have problems with loss of bowel control? N   Managing your Medications? N   Managing your Finances? N   Housekeeping or managing your Housekeeping? N     Patient Care Team: Claretta Fraise, MD as PCP - General (Family Medicine) Jovita Kussmaul, MD as Consulting Physician (General Surgery) Nicholas Lose, MD as Consulting Physician (Hematology and Oncology) Gery Pray, MD as Consulting Physician (Radiation Oncology)  Indicate any recent Medical Services you may have received from other than [facility] providers in the past year (date may be approximate).     Assessment:   This is a routine wellness examination for Morgan Schmidt.  Hearing/Vision screen Vision Screening - Comments:: Wears rx glasses - up to date with routine eye exams with  Dr.Lee   Dietary issues and exercise activities discussed: Current Exercise Habits: Home exercise routine, Type of exercise: walking, Time (Minutes): 30, Frequency (Times/Week): 3, Weekly Exercise (Minutes/Week): 90, Intensity: Mild, Exercise limited by: orthopedic condition(s)   Goals Addressed             This Visit's Progress    DIET - DECREASE SODA OR JUICE INTAKE   On track      Depression Screen    10/02/2022    11:58 AM 07/11/2022    9:58 AM 04/02/2022    1:08 PM 12/06/2021    9:07 AM 09/29/2021    2:23 PM 09/07/2021    8:28 AM 05/10/2021    8:33 AM  PHQ 2/9 Scores  PHQ - 2 Score 0 0 0 0 0 0 0    Fall Risk    10/02/2022   11:57 AM 07/11/2022    9:58 AM 04/02/2022    1:08 PM 12/06/2021    9:07 AM 09/29/2021    2:11 PM  Fall Risk   Falls in the past year? 0 0 0 0 0  Number falls in past yr: 0    0  Injury with Fall? 0    0  Risk for fall due to : No Fall Risks    No Fall Risks  Follow up Falls prevention discussed    Falls prevention discussed    FALL RISK PREVENTION PERTAINING TO THE HOME:  Any stairs in or around the home? Yes  If so, are there any without handrails? No  Home free of loose throw rugs in walkways, pet beds, electrical cords, etc? Yes  Adequate lighting in your home to reduce risk of falls? Yes   ASSISTIVE DEVICES UTILIZED TO PREVENT FALLS:  Life alert? No  Use of a cane, walker or w/c? Yes  Grab bars in the bathroom? No  Shower chair or bench in shower? No  Elevated toilet seat  or a handicapped toilet? No       09/23/2018    7:59 AM  MMSE - Mini Mental State Exam  Orientation to time 5  Orientation to Place 5  Registration 3  Attention/ Calculation 5  Recall 3  Language- name 2 objects 2  Language- repeat 1  Language- follow 3 step command 3  Language- read & follow direction 1  Write a sentence 1  Copy design 1  Total score 30        10/02/2022   12:00 PM 09/28/2020    2:35 PM 09/25/2019   11:11 AM  6CIT Screen  What Year? 0 points 0 points 0 points  What month? 0 points 0 points 0 points  What time? 0 points 0 points 0 points  Count back from 20 0 points 0 points 0 points  Months in reverse 0 points 0 points 0 points  Repeat phrase 0 points 0 points 0 points  Total Score 0 points 0 points 0 points    Immunizations Immunization History  Administered Date(s) Administered   Fluad Quad(high Dose 65+) 07/09/2019, 05/26/2020, 05/10/2021,  07/11/2022   Influenza, High Dose Seasonal PF 06/03/2018   Influenza-Unspecified 05/13/2017   Moderna Sars-Covid-2 Vaccination 12/17/2019, 01/14/2020   Pneumococcal Conjugate-13 09/03/2018   Pneumococcal Polysaccharide-23 10/26/2019   Zoster Recombinat (Shingrix) 09/07/2021, 04/02/2022    TDAP status: Due, Education has been provided regarding the importance of this vaccine. Advised may receive this vaccine at local pharmacy or Health Dept. Aware to provide a copy of the vaccination record if obtained from local pharmacy or Health Dept. Verbalized acceptance and understanding.  Flu Vaccine status: Up to date  Pneumococcal vaccine status: Up to date  Covid-19 vaccine status: Completed vaccines  Qualifies for Shingles Vaccine? Yes   Zostavax completed Yes   Shingrix Completed?: Yes  Screening Tests Health Maintenance  Topic Date Due   DTaP/Tdap/Td (1 - Tdap) Never done   Hepatitis C Screening  07/12/2023 (Originally 03/03/1969)   HEMOGLOBIN A1C  01/09/2023   MAMMOGRAM  02/03/2023   OPHTHALMOLOGY EXAM  03/17/2023   Diabetic kidney evaluation - eGFR measurement  07/12/2023   Diabetic kidney evaluation - Urine ACR  07/12/2023   FOOT EXAM  07/12/2023   Medicare Annual Wellness (AWV)  10/03/2023   DEXA SCAN  06/13/2024   COLONOSCOPY (Pts 45-39yr Insurance coverage will need to be confirmed)  11/10/2024   Pneumonia Vaccine 70 Years old  Completed   INFLUENZA VACCINE  Completed   Zoster Vaccines- Shingrix  Completed   HPV VACCINES  Aged Out   COVID-19 Vaccine  Discontinued    Health Maintenance  Health Maintenance Due  Topic Date Due   DTaP/Tdap/Td (1 - Tdap) Never done    Colorectal cancer screening: Type of screening: Colonoscopy. Completed 11/10/2021. Repeat every 3 years  Mammogram status: Ordered 08/22/2022. Pt provided with contact info and advised to call to schedule appt.   Bone Density status: Completed 06/13/2022. Results reflect: Bone density results:  OSTEOPENIA. Repeat every 5 years.  Lung Cancer Screening: (Low Dose CT Chest recommended if Age 70-80years, 30 pack-year currently smoking OR have quit w/in 15years.) does not qualify.   Lung Cancer Screening Referral: n/a  Additional Screening:  Hepatitis C Screening: does not qualify;  Vision Screening: Recommended annual ophthalmology exams for early detection of glaucoma and other disorders of the eye. Is the patient up to date with their annual eye exam?  Yes  Who is the provider or what is the  name of the office in which the patient attends annual eye exams? Dr.Lee  If pt is not established with a provider, would they like to be referred to a provider to establish care? No .   Dental Screening: Recommended annual dental exams for proper oral hygiene  Community Resource Referral / Chronic Care Management: CRR required this visit?  No   CCM required this visit?  No      Plan:     I have personally reviewed and noted the following in the patient's chart:   Medical and social history Use of alcohol, tobacco or illicit drugs  Current medications and supplements including opioid prescriptions. Patient is not currently taking opioid prescriptions. Functional ability and status Nutritional status Physical activity Advanced directives List of other physicians Hospitalizations, surgeries, and ER visits in previous 12 months Vitals Screenings to include cognitive, depression, and falls Referrals and appointments  In addition, I have reviewed and discussed with patient certain preventive protocols, quality metrics, and best practice recommendations. A written personalized care plan for preventive services as well as general preventive health recommendations were provided to patient.     Daphane Shepherd, LPN   [id number]   Nurse Notes: none

## 2022-10-02 NOTE — Patient Instructions (Signed)
Morgan Schmidt , Thank you for taking time to come for your Medicare Wellness Visit. I appreciate your ongoing commitment to your health goals. Please review the following plan we discussed and let me know if I can assist you in the future.   These are the goals we discussed:  Goals      DIET - DECREASE SODA OR JUICE INTAKE     DIET - INCREASE WATER INTAKE     DIET - REDUCE PORTION SIZE     Exercise 150 min/wk Moderate Activity     Weight (lb) < 110 lb (49.9 kg)        This is a list of the screening recommended for you and due dates:  Health Maintenance  Topic Date Due   DTaP/Tdap/Td vaccine (1 - Tdap) Never done   Hepatitis C Screening: USPSTF Recommendation to screen - Ages 18-79 yo.  07/12/2023*   Hemoglobin A1C  01/09/2023   Mammogram  02/03/2023   Eye exam for diabetics  03/17/2023   Yearly kidney function blood test for diabetes  07/12/2023   Yearly kidney health urinalysis for diabetes  07/12/2023   Complete foot exam   07/12/2023   Medicare Annual Wellness Visit  10/03/2023   DEXA scan (bone density measurement)  06/13/2024   Colon Cancer Screening  11/10/2024   Pneumonia Vaccine  Completed   Flu Shot  Completed   Zoster (Shingles) Vaccine  Completed   HPV Vaccine  Aged Out   COVID-19 Vaccine  Discontinued  *Topic was postponed. The date shown is not the original due date.    Advanced directives: Advance directive discussed with you today. I have provided a copy for you to complete at home and have notarized. Once this is complete please bring a copy in to our office so we can scan it into your chart.   Conditions/risks identified: Aim for 30 minutes of exercise or brisk walking, 6-8 glasses of water, and 5 servings of fruits and vegetables each day.   Next appointment: Follow up in one year for your annual wellness visit    Preventive Care 65 Years and Older, Female Preventive care refers to lifestyle choices and visits with your health care provider that can  promote health and wellness. What does preventive care include? A yearly physical exam. This is also called an annual well check. Dental exams once or twice a year. Routine eye exams. Ask your health care provider how often you should have your eyes checked. Personal lifestyle choices, including: Daily care of your teeth and gums. Regular physical activity. Eating a healthy diet. Avoiding tobacco and drug use. Limiting alcohol use. Practicing safe sex. Taking low-dose aspirin every day. Taking vitamin and mineral supplements as recommended by your health care provider. What happens during an annual well check? The services and screenings done by your health care provider during your annual well check will depend on your age, overall health, lifestyle risk factors, and family history of disease. Counseling  Your health care provider may ask you questions about your: Alcohol use. Tobacco use. Drug use. Emotional well-being. Home and relationship well-being. Sexual activity. Eating habits. History of falls. Memory and ability to understand (cognition). Work and work Statistician. Reproductive health. Screening  You may have the following tests or measurements: Height, weight, and BMI. Blood pressure. Lipid and cholesterol levels. These may be checked every 5 years, or more frequently if you are over 4 years old. Skin check. Lung cancer screening. You may have this screening every  year starting at age 64 if you have a 30-pack-year history of smoking and currently smoke or have quit within the past 15 years. Fecal occult blood test (FOBT) of the stool. You may have this test every year starting at age 15. Flexible sigmoidoscopy or colonoscopy. You may have a sigmoidoscopy every 5 years or a colonoscopy every 10 years starting at age 59. Hepatitis C blood test. Hepatitis B blood test. Sexually transmitted disease (STD) testing. Diabetes screening. This is done by checking your  blood sugar (glucose) after you have not eaten for a while (fasting). You may have this done every 1-3 years. Bone density scan. This is done to screen for osteoporosis. You may have this done starting at age 44. Mammogram. This may be done every 1-2 years. Talk to your health care provider about how often you should have regular mammograms. Talk with your health care provider about your test results, treatment options, and if necessary, the need for more tests. Vaccines  Your health care provider may recommend certain vaccines, such as: Influenza vaccine. This is recommended every year. Tetanus, diphtheria, and acellular pertussis (Tdap, Td) vaccine. You may need a Td booster every 10 years. Zoster vaccine. You may need this after age 32. Pneumococcal 13-valent conjugate (PCV13) vaccine. One dose is recommended after age 46. Pneumococcal polysaccharide (PPSV23) vaccine. One dose is recommended after age 25. Talk to your health care provider about which screenings and vaccines you need and how often you need them. This information is not intended to replace advice given to you by your health care provider. Make sure you discuss any questions you have with your health care provider. Document Released: 09/02/2015 Document Revised: 04/25/2016 Document Reviewed: 06/07/2015 Elsevier Interactive Patient Education  2017 [place] Prevention in the Home Falls can cause injuries. They can happen to people of all ages. There are many things you can do to make your home safe and to help prevent falls. What can I do on the outside of my home? Regularly fix the edges of walkways and driveways and fix any cracks. Remove anything that might make you trip as you walk through a door, such as a raised step or threshold. Trim any bushes or trees on the path to your home. Use bright outdoor lighting. Clear any walking paths of anything that might make someone trip, such as rocks or tools. Regularly  check to see if handrails are loose or broken. Make sure that both sides of any steps have handrails. Any raised decks and porches should have guardrails on the edges. Have any leaves, snow, or ice cleared regularly. Use sand or salt on walking paths during winter. Clean up any spills in your garage right away. This includes oil or grease spills. What can I do in the bathroom? Use night lights. Install grab bars by the toilet and in the tub and shower. Do not use towel bars as grab bars. Use non-skid mats or decals in the tub or shower. If you need to sit down in the shower, use a plastic, non-slip stool. Keep the floor dry. Clean up any water that spills on the floor as soon as it happens. Remove soap buildup in the tub or shower regularly. Attach bath mats securely with double-sided non-slip rug tape. Do not have throw rugs and other things on the floor that can make you trip. What can I do in the bedroom? Use night lights. Make sure that you have a light by your bed that  is easy to reach. Do not use any sheets or blankets that are too big for your bed. They should not hang down onto the floor. Have a firm chair that has side arms. You can use this for support while you get dressed. Do not have throw rugs and other things on the floor that can make you trip. What can I do in the kitchen? Clean up any spills right away. Avoid walking on wet floors. Keep items that you use a lot in easy-to-reach places. If you need to reach something above you, use a strong step stool that has a grab bar. Keep electrical cords out of the way. Do not use floor polish or wax that makes floors slippery. If you must use wax, use non-skid floor wax. Do not have throw rugs and other things on the floor that can make you trip. What can I do with my stairs? Do not leave any items on the stairs. Make sure that there are handrails on both sides of the stairs and use them. Fix handrails that are broken or loose.  Make sure that handrails are as long as the stairways. Check any carpeting to make sure that it is firmly attached to the stairs. Fix any carpet that is loose or worn. Avoid having throw rugs at the top or bottom of the stairs. If you do have throw rugs, attach them to the floor with carpet tape. Make sure that you have a light switch at the top of the stairs and the bottom of the stairs. If you do not have them, ask someone to add them for you. What else can I do to help prevent falls? Wear shoes that: Do not have high heels. Have rubber bottoms. Are comfortable and fit you well. Are closed at the toe. Do not wear sandals. If you use a stepladder: Make sure that it is fully opened. Do not climb a closed stepladder. Make sure that both sides of the stepladder are locked into place. Ask someone to hold it for you, if possible. Clearly mark and make sure that you can see: Any grab bars or handrails. First and last steps. Where the edge of each step is. Use tools that help you move around (mobility aids) if they are needed. These include: Canes. Walkers. Scooters. Crutches. Turn on the lights when you go into a dark area. Replace any light bulbs as soon as they burn out. Set up your furniture so you have a clear path. Avoid moving your furniture around. If any of your floors are uneven, fix them. If there are any pets around you, be aware of where they are. Review your medicines with your doctor. Some medicines can make you feel dizzy. This can increase your chance of falling. Ask your doctor what other things that you can do to help prevent falls. This information is not intended to replace advice given to you by your health care provider. Make sure you discuss any questions you have with your health care provider. Document Released: 06/02/2009 Document Revised: 01/12/2016 Document Reviewed: 09/10/2014 Elsevier Interactive Patient Education  2017 [employer].

## 2022-10-04 ENCOUNTER — Inpatient Hospital Stay: Payer: Medicare HMO | Attending: Hematology and Oncology | Admitting: Hematology and Oncology

## 2022-10-04 VITALS — BP 176/71 | HR 92 | Temp 97.5°F | Resp 17 | Wt 171.2 lb

## 2022-10-04 DIAGNOSIS — C50412 Malignant neoplasm of upper-outer quadrant of left female breast: Secondary | ICD-10-CM | POA: Insufficient documentation

## 2022-10-04 DIAGNOSIS — Z87891 Personal history of nicotine dependence: Secondary | ICD-10-CM | POA: Diagnosis not present

## 2022-10-04 DIAGNOSIS — Z79811 Long term (current) use of aromatase inhibitors: Secondary | ICD-10-CM | POA: Diagnosis not present

## 2022-10-04 DIAGNOSIS — Z923 Personal history of irradiation: Secondary | ICD-10-CM | POA: Diagnosis not present

## 2022-10-04 DIAGNOSIS — Z170 Estrogen receptor positive status [ER+]: Secondary | ICD-10-CM | POA: Insufficient documentation

## 2022-10-04 DIAGNOSIS — M810 Age-related osteoporosis without current pathological fracture: Secondary | ICD-10-CM | POA: Insufficient documentation

## 2022-10-04 MED ORDER — ANASTROZOLE 1 MG PO TABS: 1.0000 mg | ORAL_TABLET | Freq: Every day | ORAL | 3 refills | Status: DC

## 2022-10-04 NOTE — Assessment & Plan Note (Addendum)
03/22/2022:Left lumpectomy: 9 mm grade 2 IDC with DCIS with clear margins 0/2 lymph nodes negative ER 95%, PR 90%, HER2 2+ by IHC negative by FISH, Ki-67 3% Oncotype DX recurrence score: 21 (risk of distant recurrence: 7%)   Treatment plan: 1.  adjuvant radiation 05/02/2022-05/22/2022 3.  Adjuvant antiestrogen therapy with letrozole started November 2023 discontinued December 2023 because of joint pains switched to anastrozole 10/04/2022   Letrozole toxicities: Diffuse aches and pains especially in the right shoulder and the right hip  Breast cancer surveillance: Breast exam 10/04/2022: Benign Mammogram will be done in June 2024  Return to clinic in 1 year for follow-up

## 2022-10-04 NOTE — Progress Notes (Signed)
Patient Care Team: Claretta Fraise, MD as PCP - General (Family Medicine) Jovita Kussmaul, MD as Consulting Physician (General Surgery) Nicholas Lose, MD as Consulting Physician (Hematology and Oncology) Gery Pray, MD as Consulting Physician (Radiation Oncology)  DIAGNOSIS:  Encounter Diagnosis  Name Primary?   Malignant neoplasm of upper-outer quadrant of left breast in female, estrogen receptor positive ([place]) Yes    SUMMARY OF ONCOLOGIC HISTORY: Oncology History  Malignant neoplasm of upper-outer quadrant of left breast in female, estrogen receptor positive ([place])  02/21/2022 Initial Diagnosis   Screening mammogram detected left breast asymmetry mass detected on ultrasound at 3 o'clock position measuring 0.7 cm, axilla negative, biopsy revealed grade 2 IDC with DCIS ER 95%, PR 90%, HER2 equivocal by IHC, FISH negative, Ki-67 3%   02/28/2022 Cancer Staging   Staging form: Breast, AJCC 8th Edition - Clinical: Stage IA (cT1b, cN0, cM0, G2, ER+, PR+, HER2-) - Signed by Nicholas Lose, MD on 02/28/2022 Stage prefix: Initial diagnosis Histologic grading system: 3 grade system    Genetic Testing   [facility] CustomNext Panel was Negative. Report date is 03/19/2022.  The CustomNext-Cancer+RNAinsight panel offered by [facility] includes sequencing and rearrangement analysis for the following 49 genes:  APC, ATM, AXIN2, BARD1, BMPR1A, BRCA1, BRCA2, BRIP1, CDH1, CDK4, CDKN2A, CHEK2, CTNNA1, DICER1, EPCAM, GREM1, HOXB13, KIT, MEN1, MLH1, MSH2, MSH3, MSH6, MUTYH, NBN, NF1, NTHL1, PALB2, PDGFRA, PMS2, POLD1, POLE, PRKAR1A, PTEN, RAD50, RAD51C, RAD51D, RET, SDHA, SDHB, SDHC, SDHD, SMAD4, SMARCA4, STK11, TP53, TSC1, TSC2, and VHL.  RNA data is routinely analyzed for use in variant interpretation for all genes.   03/22/2022 Surgery   Left lumpectomy: 9 mm grade 2 IDC with DCIS with clear margins 0/2 lymph nodes negative ER 95%, PR 90%, HER2 2+ by IHC negative by FISH, Ki-67 3%   03/22/2022 Oncotype  testing   21/7%   05/02/2022 - 05/22/2022 Radiation Therapy   Site Technique Total Dose (Gy) Dose per Fx (Gy) Completed Fx Beam Energies  Breast, Left: Breast_L 3D 42.72/42.72 2.67 16/16 10X     05/2022 -  Anti-estrogen oral therapy   Anastrozole     CHIEF COMPLIANT: Follow-up on letrozole  INTERVAL HISTORY: Morgan Schmidt is a 72 y.o. female is here because of recent diagnosis of left breast mass. She presents to the clinic today for a follow-up. She states that she had pulled a muscle in her and twister her ankle and she thinks the letrozole was agitating it. She used Biofreeze for relief. She says her diabetes is doing good.   ALLERGIES:  is allergic to lipitor [atorvastatin calcium] and ace inhibitors.  MEDICATIONS:  Current Outpatient Medications  Medication Sig Dispense Refill   Accu-Chek FastClix Lancets MISC Use to check Blood Sugars twice daily. Dx E11.8 204 each 3   acetaminophen (TYLENOL) 325 MG tablet Take 325-650 mg by mouth every 6 (six) hours as needed (for pain or headaches).     Alcohol Swabs (B-D SINGLE USE SWABS REGULAR) PADS Use to check Blood Sugars twice daily. Dx E11.8 200 each 3   alendronate (FOSAMAX) 70 MG tablet Take 1 tablet (70 mg total) by mouth every 7 (seven) days. Take with a full glass of water on an empty stomach. Do not lay down for at least 2 hours 13 tablet 3   anastrozole (ARIMIDEX) 1 MG tablet Take 1 tablet (1 mg total) by mouth daily. 90 tablet 3   Blood Glucose Calibration (ACCU-CHEK AVIVA) SOLN Use to check Blood Sugars twice daily. Dx E11.8  1 each 3   Blood Glucose Monitoring Suppl (ACCU-CHEK AVIVA PLUS) w/Device KIT Use to check Blood Sugars twice daily. Dx E11.8 1 kit 0   Cholecalciferol (VITAMIN D) 2000 UNITS tablet Take 1 tablet (2,000 Units total) by mouth daily. (Patient taking differently: Take 2,000 Units by mouth every evening.) 30 tablet 11   diclofenac sodium (VOLTAREN) 1 % GEL Apply 2-4 g topically See admin instructions. Apply  2-4 grams to each hand up to 4 times a day as needed for arthritis in the fingers (Patient taking differently: Apply 2-4 g topically See admin instructions. Apply 2-4 grams to each hand up to 4 times a day as needed for arthritis in the fingers) 100 g 11   docusate sodium (COLACE) 100 MG capsule Take 1 capsule (100 mg total) by mouth 2 (two) times daily. 180 capsule 3   Dulaglutide (TRULICITY) 4.5 0000000 SOPN Inject 4.5 mg as directed once a week. Monday's 6.5 mL 3   glucose blood (ACCU-CHEK AVIVA PLUS) test strip TEST BLOOD SUGAR TWICE DAILY Dx E11.8 200 strip 3   insulin glargine (LANTUS SOLOSTAR) 100 UNIT/ML Solostar Pen Inject 110 Units into the skin daily after breakfast. 105 mL 3   Insulin Pen Needle (DROPLET PEN NEEDLES) 31G X 8 MM MISC USE AS DIRECTED EVERY DAY Dx E11.8 100 each 3   levothyroxine (SYNTHROID) 112 MCG tablet Take 1 tablet (112 mcg total) by mouth daily. DOSE CHANGE 90 tablet 3   metFORMIN (GLUCOPHAGE) 1000 MG tablet Take 1 tablet (1,000 mg total) by mouth 2 (two) times daily with a meal. 180 tablet 3   rosuvastatin (CRESTOR) 20 MG tablet TAKE 1 TABLET EVERY DAY FOR CHOLESTEROL 90 tablet 0   No current facility-administered medications for this visit.    PHYSICAL EXAMINATION: ECOG PERFORMANCE STATUS: 1 - Symptomatic but completely ambulatory  Vitals:   10/04/22 1337  BP: (!) 176/71  Pulse: 92  Resp: 17  Temp: (!) 97.5 F (36.4 C)  SpO2: 98%   Filed Weights   10/04/22 1337  Weight: 171 lb 3 oz (77.7 kg)      LABORATORY DATA:  I have reviewed the data as listed    Latest Ref Rng & Units 07/11/2022   11:00 AM 04/02/2022    1:15 PM 02/28/2022   12:42 PM  CMP  Glucose 70 - 99 mg/dL 88  138  190   BUN 8 - 27 mg/dL 9  10  11   $ Creatinine 0.57 - 1.00 mg/dL 0.68  0.57  0.71   Sodium 134 - 144 mmol/L 140  140  138   Potassium 3.5 - 5.2 mmol/L 4.4  4.7  3.9   Chloride 96 - 106 mmol/L 101  100  102   CO2 20 - 29 mmol/L 25  26  29   $ Calcium 8.7 - 10.3 mg/dL 8.5   9.0  8.8   Total Protein 6.0 - 8.5 g/dL 6.6  6.6  6.8   Total Bilirubin 0.0 - 1.2 mg/dL 0.8  0.6  0.8   Alkaline Phos 44 - 121 IU/L 80  86  70   AST 0 - 40 IU/L 31  19  18   $ ALT 0 - 32 IU/L 26  16  19     $ Lab Results  Component Value Date   WBC 4.9 07/11/2022   HGB 12.3 07/11/2022   HCT 38.1 07/11/2022   MCV 85 07/11/2022   PLT 195 07/11/2022   NEUTROABS 3.4 07/11/2022  ASSESSMENT & PLAN:  Malignant neoplasm of upper-outer quadrant of left breast in female, estrogen receptor positive ([place]) 03/22/2022:Left lumpectomy: 9 mm grade 2 IDC with DCIS with clear margins 0/2 lymph nodes negative ER 95%, PR 90%, HER2 2+ by IHC negative by FISH, Ki-67 3% Oncotype DX recurrence score: 21 (risk of distant recurrence: 7%)   Treatment plan: 1.  adjuvant radiation 05/02/2022-05/22/2022 3.  Adjuvant antiestrogen therapy with letrozole started November 2023 discontinued December 2023 because of joint pains switched to anastrozole 10/04/2022   Letrozole toxicities: Diffuse aches and pains especially in the right shoulder and the right hip  Breast cancer surveillance: Breast exam 10/04/2022: Benign Mammogram will be done in June 2024  Return to clinic in 1 year for follow-up    No orders of the defined types were placed in this encounter.  The patient has a good understanding of the overall plan. she agrees with it. she will call with any problems that may develop before the next visit here. Total time spent: 30 mins including face to face time and time spent for planning, charting and co-ordination of care   Harriette Ohara, MD 10/04/22    I Gardiner Coins am acting as a Education administrator for [employer]  I have reviewed the above documentation for accuracy and completeness, and I agree with the above.

## 2022-10-11 ENCOUNTER — Ambulatory Visit (INDEPENDENT_AMBULATORY_CARE_PROVIDER_SITE_OTHER): Payer: Medicare HMO | Admitting: Family Medicine

## 2022-10-11 ENCOUNTER — Encounter: Payer: Self-pay | Admitting: Family Medicine

## 2022-10-11 ENCOUNTER — Ambulatory Visit (INDEPENDENT_AMBULATORY_CARE_PROVIDER_SITE_OTHER): Payer: Medicare HMO

## 2022-10-11 VITALS — BP 142/65 | HR 80 | Temp 98.1°F | Ht 59.0 in | Wt 172.4 lb

## 2022-10-11 DIAGNOSIS — Z794 Long term (current) use of insulin: Secondary | ICD-10-CM

## 2022-10-11 DIAGNOSIS — I10 Essential (primary) hypertension: Secondary | ICD-10-CM

## 2022-10-11 DIAGNOSIS — E1169 Type 2 diabetes mellitus with other specified complication: Secondary | ICD-10-CM

## 2022-10-11 DIAGNOSIS — M19011 Primary osteoarthritis, right shoulder: Secondary | ICD-10-CM | POA: Diagnosis not present

## 2022-10-11 DIAGNOSIS — E782 Mixed hyperlipidemia: Secondary | ICD-10-CM

## 2022-10-11 DIAGNOSIS — E039 Hypothyroidism, unspecified: Secondary | ICD-10-CM

## 2022-10-11 DIAGNOSIS — M25511 Pain in right shoulder: Secondary | ICD-10-CM

## 2022-10-11 DIAGNOSIS — G8929 Other chronic pain: Secondary | ICD-10-CM

## 2022-10-11 DIAGNOSIS — E118 Type 2 diabetes mellitus with unspecified complications: Secondary | ICD-10-CM | POA: Diagnosis not present

## 2022-10-11 LAB — BAYER DCA HB A1C WAIVED: HB A1C (BAYER DCA - WAIVED): 7.6 % — ABNORMAL HIGH (ref 4.8–5.6)

## 2022-10-11 MED ORDER — OLMESARTAN MEDOXOMIL 40 MG PO TABS: 40.0000 mg | ORAL_TABLET | Freq: Every day | ORAL | 1 refills | Status: DC

## 2022-10-11 NOTE — Progress Notes (Signed)
Subjective:  [name],  female    DOB: 02-27-1951  Age: 72 y.o.    CC: Medical Management of Chronic Issues   HPI Morgan Schmidt presents for  follow-up of hypertension. Patient has no history of headache chest pain or shortness of breath or recent cough. Patient also denies symptoms of TIA such as numbness weakness lateralizing. Patient denies side effects from medication. States taking it regularly.  Patient also  in for follow-up of elevated cholesterol. Doing well without complaints on current medication. Denies side effects  including myalgia and arthralgia and nausea. Also in today for liver function testing. Currently no chest pain, shortness of breath or other cardiovascular related symptoms noted.  Follow-up of diabetes. Patient does check blood sugar at home. Readings run between 100 and 150 Patient denies symptoms such as excessive hunger or urinary frequency, excessive hunger, nausea No significant hypoglycemic spells noted. Medications reviewed. Pt reports taking them regularly. Pt. denies complication/adverse reaction today.    History Morgan Schmidt has a past medical history of AIN (anal intraepithelial neoplasia) anal canal, Cancer ([place]) (03/15/2022), History of adenomatous polyp of colon, History of radiation therapy, History of thyroid cancer (06/2008), Hyperlipidemia, Hypertension, Hypothyroidism, postsurgical (06/2008), OA (osteoarthritis), Type 2 diabetes mellitus treated with insulin ([place]), and Wears dentures.   Morgan Schmidt has a past surgical history that includes Total thyroidectomy (Bilateral, 06/30/2008); Cesarean section (1985); Knee arthroscopy (Left, 10/22/2016); Knee arthroscopy with medial menisectomy (10/22/2016); Knee arthroscopy with subchondroplasty (Left, 10/22/2016); Hip pinning, cannulated (Left, 03/10/2018); Colonoscopy with propofol (11/10/2021); Anal intraepithelial neoplasia excision (N/A, 11/30/2021); High resolution anoscopy (N/A, 11/30/2021);  and Breast lumpectomy with radioactive seed and sentinel lymph node biopsy (Left, 03/22/2022).   Her family history includes Arthritis in her father; Heart disease in her mother.Morgan Schmidt reports that Morgan Schmidt quit smoking about 22 years ago. Her smoking use included cigarettes. Morgan Schmidt has a 2.50 pack-year smoking history. Morgan Schmidt has never used smokeless tobacco. Morgan Schmidt reports that Morgan Schmidt does not drink alcohol and does not use drugs.  Current Outpatient Medications on File Prior to Visit  Medication Sig Dispense Refill   Accu-Chek FastClix Lancets MISC Use to check Blood Sugars twice daily. Dx E11.8 204 each 3   acetaminophen (TYLENOL) 325 MG tablet Take 325-650 mg by mouth every 6 (six) hours as needed (for pain or headaches).     Alcohol Swabs (B-D SINGLE USE SWABS REGULAR) PADS Use to check Blood Sugars twice daily. Dx E11.8 200 each 3   alendronate (FOSAMAX) 70 MG tablet Take 1 tablet (70 mg total) by mouth every 7 (seven) days. Take with a full glass of water on an empty stomach. Do not lay down for at least 2 hours 13 tablet 3   anastrozole (ARIMIDEX) 1 MG tablet Take 1 tablet (1 mg total) by mouth daily. 90 tablet 3   Blood Glucose Calibration (ACCU-CHEK AVIVA) SOLN Use to check Blood Sugars twice daily. Dx E11.8 1 each 3   Blood Glucose Monitoring Suppl (ACCU-CHEK AVIVA PLUS) w/Device KIT Use to check Blood Sugars twice daily. Dx E11.8 1 kit 0   Cholecalciferol (VITAMIN D) 2000 UNITS tablet Take 1 tablet (2,000 Units total) by mouth daily. (Patient taking differently: Take 2,000 Units by mouth every evening.) 30 tablet 11   diclofenac sodium (VOLTAREN) 1 % GEL Apply 2-4 g topically See admin instructions. Apply 2-4 grams to each hand up to 4 times a day as needed for arthritis in the fingers (Patient taking differently: Apply 2-4 g topically See admin  instructions. Apply 2-4 grams to each hand up to 4 times a day as needed for arthritis in the fingers) 100 g 11   docusate sodium (COLACE) 100 MG capsule Take 1  capsule (100 mg total) by mouth 2 (two) times daily. 180 capsule 3   Dulaglutide (TRULICITY) 4.5 0000000 SOPN Inject 4.5 mg as directed once a week. Monday's 6.5 mL 3   glucose blood (ACCU-CHEK AVIVA PLUS) test strip TEST BLOOD SUGAR TWICE DAILY Dx E11.8 200 strip 3   insulin glargine (LANTUS SOLOSTAR) 100 UNIT/ML Solostar Pen Inject 110 Units into the skin daily after breakfast. 105 mL 3   Insulin Pen Needle (DROPLET PEN NEEDLES) 31G X 8 MM MISC USE AS DIRECTED EVERY DAY Dx E11.8 100 each 3   levothyroxine (SYNTHROID) 112 MCG tablet Take 1 tablet (112 mcg total) by mouth daily. DOSE CHANGE 90 tablet 3   metFORMIN (GLUCOPHAGE) 1000 MG tablet Take 1 tablet (1,000 mg total) by mouth 2 (two) times daily with a meal. 180 tablet 3   rosuvastatin (CRESTOR) 20 MG tablet TAKE 1 TABLET EVERY DAY FOR CHOLESTEROL 90 tablet 0   No current facility-administered medications on file prior to visit.    ROS Review of Systems  Constitutional: Negative.   HENT: Negative.    Eyes:  Negative for visual disturbance.  Respiratory:  Negative for shortness of breath.   Cardiovascular:  Negative for chest pain.  Gastrointestinal:  Negative for abdominal pain.  Musculoskeletal:  Positive for arthralgias (right shoulder and leg. New med started by Dr. Rubbie Battiest at [facility]).    Objective:  BP (!) 142/65   Pulse 80   Temp 98.1 F (36.7 C)   Ht '4\' 11"'$  (1.499 m)   Wt 172 lb 6.4 oz (78.2 kg)   LMP 03/02/2004   SpO2 96%   BMI 34.82 kg/m   BP Readings from Last 3 Encounters:  10/11/22 (!) 142/65  10/04/22 (!) 176/71  08/22/22 (!) 143/51    Wt Readings from Last 3 Encounters:  10/11/22 172 lb 6.4 oz (78.2 kg)  10/04/22 171 lb 3 oz (77.7 kg)  10/02/22 170 lb (77.1 kg)     Physical Exam Constitutional:      General: Morgan Schmidt is not in acute distress.    Appearance: Morgan Schmidt is well-developed.  Cardiovascular:     Rate and Rhythm: Normal rate and regular rhythm.  Pulmonary:     Breath sounds:  Normal breath sounds.  Musculoskeletal:        General: Normal range of motion.  Skin:    General: Skin is warm and dry.  Neurological:     Mental Status: Morgan Schmidt is alert and oriented to person, place, and time.     Diabetic Foot Exam - Simple   No data filed     Lab Results  Component Value Date   HGBA1C 7.6 (H) 10/11/2022   HGBA1C 6.9 (H) 07/11/2022   HGBA1C 7.5 (H) 04/02/2022    Assessment & Plan:   Morgan Schmidt was seen today for medical management of chronic issues.  Diagnoses and all orders for this visit:  Essential hypertension -     CBC with Differential/Platelet -     CMP14+EGFR  Controlled type 2 diabetes mellitus with complication, with long-term current use of insulin (HCC) -     Bayer DCA Hb A1c Waived  Hypothyroidism, unspecified type -     TSH + free T4  Mixed diabetic hyperlipidemia associated with type 2 diabetes mellitus ([place]) -  Lipid panel  Chronic right shoulder pain -     DG Shoulder Right; Future  Other orders -     olmesartan (BENICAR) 40 MG tablet; Take 1 tablet (40 mg total) by mouth daily. For blood pressure   I am having Salomon Mast. Armor start on olmesartan. I am also having her maintain her Vitamin D, acetaminophen, diclofenac sodium, Accu-Chek Aviva Plus, Accu-Chek Aviva, B-D SINGLE USE SWABS REGULAR, Accu-Chek FastClix Lancets, docusate sodium, Accu-Chek Aviva Plus, metFORMIN, Trulicity, levothyroxine, alendronate, Lantus SoloStar, Droplet Pen Needles, rosuvastatin, and anastrozole.  Meds ordered this encounter  Medications   olmesartan (BENICAR) 40 MG tablet    Sig: Take 1 tablet (40 mg total) by mouth daily. For blood pressure    Dispense:  90 tablet    Refill:  1     Follow-up: Return in about 3 months (around 01/09/2023).  Claretta Fraise, M.D.

## 2022-10-12 LAB — CMP14+EGFR
ALT: 16 IU/L (ref 0–32)
AST: 17 IU/L (ref 0–40)
Albumin/Globulin Ratio: 2.6 — ABNORMAL HIGH (ref 1.2–2.2)
Albumin: 4.6 g/dL (ref 3.9–4.9)
Alkaline Phosphatase: 65 IU/L (ref 44–121)
BUN/Creatinine Ratio: 15 (ref 12–28)
BUN: 9 mg/dL (ref 8–27)
Bilirubin Total: 0.7 mg/dL (ref 0.0–1.2)
CO2: 22 mmol/L (ref 20–29)
Calcium: 7.9 mg/dL — ABNORMAL LOW (ref 8.7–10.3)
Chloride: 104 mmol/L (ref 96–106)
Creatinine, Ser: 0.59 mg/dL (ref 0.57–1.00)
Globulin, Total: 1.8 g/dL (ref 1.5–4.5)
Glucose: 91 mg/dL (ref 70–99)
Potassium: 4.3 mmol/L (ref 3.5–5.2)
Sodium: 142 mmol/L (ref 134–144)
Total Protein: 6.4 g/dL (ref 6.0–8.5)
eGFR: 97 mL/min/{1.73_m2} (ref >59)

## 2022-10-12 LAB — CBC WITH DIFFERENTIAL/PLATELET
Basophils Absolute: 0.1 10*3/uL (ref 0.0–0.2)
Basos: 1 %
EOS (ABSOLUTE): 0.1 10*3/uL (ref 0.0–0.4)
Eos: 2 %
Hematocrit: 37.7 % (ref 34.0–46.6)
Hemoglobin: 12.6 g/dL (ref 11.1–15.9)
Immature Grans (Abs): 0 10*3/uL (ref 0.0–0.1)
Immature Granulocytes: 0 %
Lymphocytes Absolute: 1.4 10*3/uL (ref 0.7–3.1)
Lymphs: 27 %
MCH: 26.9 pg (ref 26.6–33.0)
MCHC: 33.4 g/dL (ref 31.5–35.7)
MCV: 81 fL (ref 79–97)
Monocytes Absolute: 0.3 10*3/uL (ref 0.1–0.9)
Monocytes: 7 %
Neutrophils Absolute: 3.2 10*3/uL (ref 1.4–7.0)
Neutrophils: 63 %
Platelets: 195 10*3/uL (ref 150–450)
RBC: 4.68 x10E6/uL (ref 3.77–5.28)
RDW: 13.8 % (ref 11.7–15.4)
WBC: 5.1 10*3/uL (ref 3.4–10.8)

## 2022-10-12 LAB — LIPID PANEL
Chol/HDL Ratio: 2.7 ratio (ref 0.0–4.4)
Cholesterol, Total: 124 mg/dL (ref 100–199)
HDL: 46 mg/dL (ref >39)
LDL Chol Calc (NIH): 56 mg/dL (ref 0–99)
Triglycerides: 121 mg/dL (ref 0–149)
VLDL Cholesterol Cal: 22 mg/dL (ref 5–40)

## 2022-10-12 LAB — TSH+FREE T4
Free T4: 1.81 ng/dL — ABNORMAL HIGH (ref 0.82–1.77)
TSH: 0.796 u[IU]/mL (ref 0.450–4.500)

## 2022-10-15 ENCOUNTER — Ambulatory Visit: Payer: Medicare HMO | Attending: Hematology and Oncology

## 2022-10-15 VITALS — Wt 171.5 lb

## 2022-10-15 DIAGNOSIS — Z483 Aftercare following surgery for neoplasm: Secondary | ICD-10-CM | POA: Insufficient documentation

## 2022-10-15 NOTE — Patient Instructions (Signed)
PLEASE KEEP YOUR COMPRESSION GARMENT ON DURING THE DAY TO GET THE BEST SWELLING REDUCTION. HERE ARE SOME ADDITIONAL TIPS: Do not sleep in your garment. If you have pain or notice swelling in your hand or at the top of your shoulder, call your therapist. This may be a sign that you need a different garment. 3.  Take good care of your garment so it lasts longer: Follow washing instructions on your garment label or box. Wash periodically using a mild detergent in warm water.  Do not use fabric softener or bleach.  Place garment in a mesh lingerie bag and use the gentle cycle of the washing machine or hand wash. Tumble dry low or lay flat to dry. TAKE CARE OF YOUR SKIN Apply a low pH moisturizing lotion to your skin daily Avoid scratching your skin Treat skin irritations quickly  Know the 5 warning signs of infection: redness, pain, warmth to touch, fever and increased swelling.  Call your physician immediately if you notice any of these signs of a possible infection.

## 2022-10-15 NOTE — Therapy (Signed)
OUTPATIENT PHYSICAL THERAPY SOZO SCREENING NOTE   Patient Name: Morgan Schmidt MRN: [id number] DOB:02/10/1951, 72 y.o., female Today's Date: 10/15/2022  PCP: Claretta Fraise, MD REFERRING PROVIDER: Nicholas Lose, MD   PT End of Session - 10/15/22 [phone]     Visit Number 2   # unchanged due to screen only   PT Start Time [id number]    PT Stop Time 61    PT Time Calculation (min) 19 min    Activity Tolerance Patient tolerated treatment well    Behavior During Therapy [facility] for tasks assessed/performed             Past Medical History:  Diagnosis Date   AIN (anal intraepithelial neoplasia) anal canal    Cancer ([place]) 03/15/2022   breast   History of adenomatous polyp of colon    History of radiation therapy    Left breast- 05/01/22-05/22/22- Dr. Gery Pray   History of thyroid cancer 06/2008   s/p  total bilateral thyroidectomy for symptomatic multinodular goiter;   06-30-2008 s/p total thyroidectomy -- papillary thyroid cancer  and treated w/ RAI post surgery 09-29-2008   (last scan negative for recurrence in epic 03/ 2011)   Hyperlipidemia    Hypertension    Hypothyroidism, postsurgical 06/2008   and had RAI post surgery 02/ 2010;   followed by pcp   OA (osteoarthritis)    Type 2 diabetes mellitus treated with insulin ([place])    followed by pcp   (11-23-2021  per pt checks daily in am and at times in afternoon,  fasting sugar--- 95--110)   Wears dentures    upper   Past Surgical History:  Procedure Laterality Date   ANAL INTRAEPITHELIAL NEOPLASIA EXCISION N/A 11/30/2021   Procedure: EXCISIONAL BIOPSY ANAL LESIONS;  Surgeon: Leighton Ruff, MD;  Location: [place];  Service: General;  Laterality: N/A;   BREAST LUMPECTOMY WITH RADIOACTIVE SEED AND SENTINEL LYMPH NODE BIOPSY Left 03/22/2022   Procedure: LEFT BREAST LUMPECTOMY WITH RADIOACTIVE SEED AND SENTINEL LYMPH NODE BIOPSY;  Surgeon: Jovita Kussmaul, MD;  Location: [place];  Service: General;   Laterality: Left;  [place]   W/  BILATERAL TUBAL LIGATION   COLONOSCOPY WITH PROPOFOL  11/10/2021   by Dr.Pyrtle   HIGH RESOLUTION ANOSCOPY N/A 11/30/2021   Procedure: HIGH RESOLUTION ANOSCOPY;  Surgeon: Leighton Ruff, MD;  Location: [facility];  Service: General;  Laterality: N/A;   HIP PINNING,CANNULATED Left 03/10/2018   Procedure: CANNULATED HIP PINNING;  Surgeon: Paralee Cancel, MD;  Location: [place];  Service: Orthopedics;  Laterality: Left;   KNEE ARTHROSCOPY Left 10/22/2016   Procedure: ARTHROSCOPY LEFT KNEE WITH DEBRIDEMENT;  Surgeon: Paralee Cancel, MD;  Location: [facility];  Service: Orthopedics;  Laterality: Left;   KNEE ARTHROSCOPY WITH MEDIAL MENISECTOMY  10/22/2016   Procedure: KNEE ARTHROSCOPY WITH MEDIAL MENISECTOMY;  Surgeon: Paralee Cancel, MD;  Location[facility];  Service: Orthopedics;;   KNEE ARTHROSCOPY WITH SUBCHONDROPLASTY Left 10/22/2016   Procedure: KNEE ARTHROSCOPY WITH MEDIAL CHONDROPLASTY;  Surgeon: Paralee Cancel, MD;  Location: [facility];  Service: Orthopedics;  Laterality: Left;   TOTAL THYROIDECTOMY Bilateral 06/30/2008   '@[facility]'$  by dr Excell Seltzer   Patient Active Problem List   Diagnosis Date Noted   Genetic testing 03/20/2022   Malignant neoplasm of upper-outer quadrant of left breast in female, estrogen receptor positive ([place]) 02/26/2022   Pain in left knee 07/01/2018  Controlled diabetes mellitus type 2 with complications ([place]) 123456   Essential hypertension 03/23/2016   Other specified hypothyroidism 12/16/2014   Mixed diabetic hyperlipidemia associated with type 2 diabetes mellitus ([place]) 12/16/2014   Long term current use of insulin ([place]) 12/16/2014   Polyposis of colon 12/16/2014    REFERRING DIAG: left breast cancer at risk for lymphedema  THERAPY DIAG: Aftercare following surgery for neoplasm  PERTINENT HISTORY: Patient was diagnosed on 02/21/22 with  left grade 2. It measures 0.7 cm and is located in the upper-outer quadrant. It is ER+, PR+, HER2- with a Ki67 of 3%. Hx of rectal cancer and surgery in 12/2021. 03/22/22 L breast lumpectomy and SLNB 0/2   PRECAUTIONS: left UE Lymphedema risk, None  SUBJECTIVE: Pt returns for her 3 month L-Dex screen.   PAIN:  Are you having pain? No  SOZO SCREENING: Patient was assessed today using the SOZO machine to determine the lymphedema index score. This was compared to her baseline score. It was determined that she is NOT within the recommended range when compared to her baseline and so she was fitted for a compression garment (sleeve and gauntlet as she reports she occasionally has intermittent hand swelling) while in the clinic today. It is recommended she return in 1 month to be reassessed. If she continues to measure outside the recommended range, physical therapy treatment will be recommended at that time and a referral requested. Also issued handout on tips for wearing garments for next month and pts questions were answered.     L-DEX FLOWSHEETS - 10/15/22 0900       L-DEX LYMPHEDEMA SCREENING   Measurement Type Unilateral    L-DEX MEASUREMENT EXTREMITY Upper Extremity    POSITION  Standing    DOMINANT SIDE Right    At Risk Side Left    BASELINE SCORE (UNILATERAL) 0    L-DEX SCORE (UNILATERAL) 11    VALUE CHANGE (UNILAT) 11               Otelia Limes, [place] 10/15/2022, 10:22 AM   PLEASE KEEP YOUR COMPRESSION GARMENT ON DURING THE DAY TO GET THE BEST SWELLING REDUCTION. HERE ARE SOME ADDITIONAL TIPS: Do not sleep in your garment. If you have pain or notice swelling in your hand or at the top of your shoulder, call your therapist. This may be a sign that you need a different garment. 3.  Take good care of your garment so it lasts longer: Follow washing instructions on your garment label or box. Wash periodically using a mild detergent in warm water.  Do not use fabric  softener or bleach.  Place garment in a mesh lingerie bag and use the gentle cycle of the washing machine or hand wash. Tumble dry low or lay flat to dry. TAKE CARE OF YOUR SKIN Apply a low pH moisturizing lotion to your skin daily Avoid scratching your skin Treat skin irritations quickly  Know the 5 warning signs of infection: redness, pain, warmth to touch, fever and increased swelling.  Call your physician immediately if you notice any of these signs of a possible infection.

## 2022-10-16 DIAGNOSIS — D013 Carcinoma in situ of anus and anal canal: Secondary | ICD-10-CM | POA: Diagnosis not present

## 2022-11-12 ENCOUNTER — Ambulatory Visit: Payer: Medicare HMO | Attending: Hematology and Oncology

## 2022-11-12 VITALS — Wt 170.1 lb

## 2022-11-12 DIAGNOSIS — Z483 Aftercare following surgery for neoplasm: Secondary | ICD-10-CM

## 2022-11-12 NOTE — Therapy (Addendum)
OUTPATIENT PHYSICAL THERAPY SOZO SCREENING NOTE   Patient Name: Morgan Schmidt MRN: [id number] DOB:1951-06-22, 72 y.o., female Today's Date: 11/12/2022  PCP: Claretta Fraise, MD REFERRING PROVIDER: Nicholas Lose, MD   PT End of Session - 11/12/22 [phone]     Visit Number 2   # unchanged due to screen only   PT Start Time [id number]    PT Stop Time 0959    PT Time Calculation (min) 4 min    Activity Tolerance Patient tolerated treatment well    Behavior During Therapy [facility] for tasks assessed/performed             Past Medical History:  Diagnosis Date   AIN (anal intraepithelial neoplasia) anal canal    Cancer ([place]) 03/15/2022   breast   History of adenomatous polyp of colon    History of radiation therapy    Left breast- 05/01/22-05/22/22- Dr. Gery Pray   History of thyroid cancer 06/2008   s/p  total bilateral thyroidectomy for symptomatic multinodular goiter;   06-30-2008 s/p total thyroidectomy -- papillary thyroid cancer  and treated w/ RAI post surgery 09-29-2008   (last scan negative for recurrence in epic 03/ 2011)   Hyperlipidemia    Hypertension    Hypothyroidism, postsurgical 06/2008   and had RAI post surgery 02/ 2010;   followed by pcp   OA (osteoarthritis)    Type 2 diabetes mellitus treated with insulin ([place])    followed by pcp   (11-23-2021  per pt checks daily in am and at times in afternoon,  fasting sugar--- 95--110)   Wears dentures    upper   Past Surgical History:  Procedure Laterality Date   ANAL INTRAEPITHELIAL NEOPLASIA EXCISION N/A 11/30/2021   Procedure: EXCISIONAL BIOPSY ANAL LESIONS;  Surgeon: Leighton Ruff, MD;  Location: [place];  Service: General;  Laterality: N/A;   BREAST LUMPECTOMY WITH RADIOACTIVE SEED AND SENTINEL LYMPH NODE BIOPSY Left 03/22/2022   Procedure: LEFT BREAST LUMPECTOMY WITH RADIOACTIVE SEED AND SENTINEL LYMPH NODE BIOPSY;  Surgeon: Jovita Kussmaul, MD;  Location: [place];  Service: General;   Laterality: Left;  [place]   W/  BILATERAL TUBAL LIGATION   COLONOSCOPY WITH PROPOFOL  11/10/2021   by Dr.Pyrtle   HIGH RESOLUTION ANOSCOPY N/A 11/30/2021   Procedure: HIGH RESOLUTION ANOSCOPY;  Surgeon: Leighton Ruff, MD;  Location: [facility];  Service: General;  Laterality: N/A;   HIP PINNING,CANNULATED Left 03/10/2018   Procedure: CANNULATED HIP PINNING;  Surgeon: Paralee Cancel, MD;  Location: [place];  Service: Orthopedics;  Laterality: Left;   KNEE ARTHROSCOPY Left 10/22/2016   Procedure: ARTHROSCOPY LEFT KNEE WITH DEBRIDEMENT;  Surgeon: Paralee Cancel, MD;  Location[facility];  Service: Orthopedics;  Laterality: Left;   KNEE ARTHROSCOPY WITH MEDIAL MENISECTOMY  10/22/2016   Procedure: KNEE ARTHROSCOPY WITH MEDIAL MENISECTOMY;  Surgeon: Paralee Cancel, MD;  Location: [facility];  Service: Orthopedics;;   KNEE ARTHROSCOPY WITH SUBCHONDROPLASTY Left 10/22/2016   Procedure: KNEE ARTHROSCOPY WITH MEDIAL CHONDROPLASTY;  Surgeon: Paralee Cancel, MD;  Location: [facility];  Service: Orthopedics;  Laterality: Left;   TOTAL THYROIDECTOMY Bilateral 06/30/2008   @[facility]  by dr Excell Seltzer   Patient Active Problem List   Diagnosis Date Noted   Genetic testing 03/20/2022   Malignant neoplasm of upper-outer quadrant of left breast in female, estrogen receptor positive ([employer]) 02/26/2022   Pain in left knee 07/01/2018  Controlled diabetes mellitus type 2 with complications (HCC) 03/19/2018   Essential hypertension 03/23/2016   Other specified hypothyroidism 12/16/2014   Mixed diabetic hyperlipidemia associated with type 2 diabetes mellitus (HCC) 12/16/2014   Long term current use of insulin (HCC) 12/16/2014   Polyposis of colon 12/16/2014    REFERRING DIAG: left breast cancer at risk for lymphedema  THERAPY DIAG: Aftercare following surgery for neoplasm  PERTINENT HISTORY: Patient was diagnosed on 02/21/22 with  left grade 2. It measures 0.7 cm and is located in the upper-outer quadrant. It is ER+, PR+, HER2- with a Ki67 of 3%. Hx of rectal cancer and surgery in 12/2021. 03/22/22 L breast lumpectomy and SLNB 0/2   PRECAUTIONS: left UE Lymphedema risk, None  SUBJECTIVE: Pt returns for her 1 month L-Dex screen after scoring high last time indicating subclinical lymphedema.   PAIN:  Are you having pain? No  SOZO SCREENING: Patient was assessed today using the SOZO machine to determine the lymphedema index score. This was compared to her baseline score. It was determined that she is NOT within the recommended range when compared to her baseline and so she was fitted for a compression garment (sleeve and gauntlet as she reports she occasionally has intermittent hand swelling) while in the clinic today. It is recommended she return in 1 month to be reassessed. If she continues to measure outside the recommended range, physical therapy treatment will be recommended at that time and a referral requested. Also issued handout on tips for wearing garments for next month and pts questions were answered.   P: Pt has decreased from being in the red to yellow so she will wear her compression sleeve x1 more month and return for another SOZO then.     L-DEX FLOWSHEETS - 11/12/22 0900       L-DEX LYMPHEDEMA SCREENING   Measurement Type Unilateral    L-DEX MEASUREMENT EXTREMITY Upper Extremity    POSITION  Standing    DOMINANT SIDE Right    At Risk Side Left    BASELINE SCORE (UNILATERAL) 0    L-DEX SCORE (UNILATERAL) 8.4    VALUE CHANGE (UNILAT) 8.4               Hermenia Bers, PTA 11/12/2022, 10:01 AM

## 2022-11-26 ENCOUNTER — Encounter: Payer: Self-pay | Admitting: *Deleted

## 2022-12-12 ENCOUNTER — Ambulatory Visit: Payer: Medicare HMO | Attending: Hematology and Oncology

## 2022-12-12 ENCOUNTER — Other Ambulatory Visit: Payer: Self-pay | Admitting: *Deleted

## 2022-12-12 VITALS — Wt 168.0 lb

## 2022-12-12 DIAGNOSIS — Z483 Aftercare following surgery for neoplasm: Secondary | ICD-10-CM | POA: Insufficient documentation

## 2022-12-12 DIAGNOSIS — Z170 Estrogen receptor positive status [ER+]: Secondary | ICD-10-CM

## 2022-12-12 NOTE — Therapy (Signed)
OUTPATIENT PHYSICAL THERAPY SOZO SCREENING NOTE   Patient Name: Morgan Schmidt MRN: [id number] DOB:1951/03/08, 72 y.o., female Today's Date: 12/12/2022  PCP: Mechele Claude, MD REFERRING PROVIDER: Serena Croissant, MD   PT End of Session - 12/12/22 1204     Visit Number 2   # unchanged due to screen only   PT Start Time 1201    PT Stop Time 1210    PT Time Calculation (min) 9 min    Activity Tolerance Patient tolerated treatment well    Behavior During Therapy [facility] for tasks assessed/performed             Past Medical History:  Diagnosis Date   AIN (anal intraepithelial neoplasia) anal canal    Cancer (HCC) 03/15/2022   breast   History of adenomatous polyp of colon    History of radiation therapy    Left breast- 05/01/22-05/22/22- Dr. Antony Blackbird   History of thyroid cancer 06/2008   s/p  total bilateral thyroidectomy for symptomatic multinodular goiter;   06-30-2008 s/p total thyroidectomy -- papillary thyroid cancer  and treated w/ RAI post surgery 09-29-2008   (last scan negative for recurrence in epic 03/ 2011)   Hyperlipidemia    Hypertension    Hypothyroidism, postsurgical 06/2008   and had RAI post surgery 02/ 2010;   followed by pcp   OA (osteoarthritis)    Type 2 diabetes mellitus treated with insulin (HCC)    followed by pcp   (11-23-2021  per pt checks daily in am and at times in afternoon,  fasting sugar--- 95--110)   Wears dentures    upper   Past Surgical History:  Procedure Laterality Date   ANAL INTRAEPITHELIAL NEOPLASIA EXCISION N/A 11/30/2021   Procedure: EXCISIONAL BIOPSY ANAL LESIONS;  Surgeon: Romie Levee, MD;  Location: [facility];  Service: General;  Laterality: N/A;   BREAST LUMPECTOMY WITH RADIOACTIVE SEED AND SENTINEL LYMPH NODE BIOPSY Left 03/22/2022   Procedure: LEFT BREAST LUMPECTOMY WITH RADIOACTIVE SEED AND SENTINEL LYMPH NODE BIOPSY;  Surgeon: Griselda Miner, MD;  Location: [facility];  Service: General;   Laterality: Left;  GEN & PEC BLOCK   CESAREAN SECTION  1985   W/  BILATERAL TUBAL LIGATION   COLONOSCOPY WITH PROPOFOL  11/10/2021   by Dr.Pyrtle   HIGH RESOLUTION ANOSCOPY N/A 11/30/2021   Procedure: HIGH RESOLUTION ANOSCOPY;  Surgeon: Romie Levee, MD;  Location: [facility];  Service: General;  Laterality: N/A;   HIP PINNING,CANNULATED Left 03/10/2018   Procedure: CANNULATED HIP PINNING;  Surgeon: Durene Romans, MD;  Location: [facility];  Service: Orthopedics;  Laterality: Left;   KNEE ARTHROSCOPY Left 10/22/2016   Procedure: ARTHROSCOPY LEFT KNEE WITH DEBRIDEMENT;  Surgeon: Durene Romans, MD;  Location: [facility];  Service: Orthopedics;  Laterality: Left;   KNEE ARTHROSCOPY WITH MEDIAL MENISECTOMY  10/22/2016   Procedure: KNEE ARTHROSCOPY WITH MEDIAL MENISECTOMY;  Surgeon: Durene Romans, MD;  Location: [facility];  Service: Orthopedics;;   KNEE ARTHROSCOPY WITH SUBCHONDROPLASTY Left 10/22/2016   Procedure: KNEE ARTHROSCOPY WITH MEDIAL CHONDROPLASTY;  Surgeon: Durene Romans, MD;  Location: [facility];  Service: Orthopedics;  Laterality: Left;   TOTAL THYROIDECTOMY Bilateral 06/30/2008    by dr Johna Sheriff   Patient Active Problem List   Diagnosis Date Noted   Genetic testing 03/20/2022   Malignant neoplasm of upper-outer quadrant of left breast in female, estrogen receptor positive 02/26/2022   Pain in left knee 07/01/2018  Controlled diabetes mellitus type 2 with complications 03/19/2018   Essential hypertension 03/23/2016   Other specified hypothyroidism 12/16/2014   Mixed diabetic hyperlipidemia associated with type 2 diabetes mellitus (HCC) 12/16/2014   Long term current use of insulin 12/16/2014   Polyposis of colon 12/16/2014    REFERRING DIAG: left breast cancer at risk for lymphedema  THERAPY DIAG: Aftercare following surgery for neoplasm  PERTINENT HISTORY: Patient was diagnosed on 02/21/22 with left grade 2. It  measures 0.7 cm and is located in the upper-outer quadrant. It is ER+, PR+, HER2- with a Ki67 of 3%. Hx of rectal cancer and surgery in 12/2021. 03/22/22 L breast lumpectomy and SLNB 0/2   PRECAUTIONS: left UE Lymphedema risk, None  SUBJECTIVE: Pt returns for her second 1 month L-Dex screen after scoring high last time indicating subclinical lymphedema.   PAIN:  Are you having pain? No  SOZO SCREENING: Patient was assessed today using the SOZO machine to determine the lymphedema index score. This was compared to her baseline score. It was determined that she is NOT within the recommended range when compared to her baseline and this is her second month. She did reduce further from 8.4 to 7.5 but this still leaves her on the yellow so she is agreeable to coming in for treatment to learn self MLD. PTA consulted with a PT about this. PT determined it would be appropriate to initiate therapy at this time. PT requested a referral from patient's provider.   P: Eval to learn Lt UE self MLD. Wants to come only 1x/week and limit visits.     L-DEX FLOWSHEETS - 12/12/22 1200       L-DEX LYMPHEDEMA SCREENING   Measurement Type Unilateral    L-DEX MEASUREMENT EXTREMITY Upper Extremity    POSITION  Standing    DOMINANT SIDE Right    At Risk Side Left    BASELINE SCORE (UNILATERAL) 0    L-DEX SCORE (UNILATERAL) 7.5    VALUE CHANGE (UNILAT) 7.5               Hermenia Bers, PTA 12/12/2022, 12:17 PM

## 2022-12-25 ENCOUNTER — Encounter: Payer: Self-pay | Admitting: Physical Therapy

## 2022-12-25 ENCOUNTER — Other Ambulatory Visit: Payer: Self-pay

## 2022-12-25 ENCOUNTER — Ambulatory Visit: Payer: Medicare HMO | Attending: Hematology and Oncology | Admitting: Physical Therapy

## 2022-12-25 DIAGNOSIS — Z170 Estrogen receptor positive status [ER+]: Secondary | ICD-10-CM | POA: Diagnosis not present

## 2022-12-25 DIAGNOSIS — I890 Lymphedema, not elsewhere classified: Secondary | ICD-10-CM | POA: Diagnosis not present

## 2022-12-25 DIAGNOSIS — C50412 Malignant neoplasm of upper-outer quadrant of left female breast: Secondary | ICD-10-CM | POA: Diagnosis not present

## 2022-12-25 DIAGNOSIS — R293 Abnormal posture: Secondary | ICD-10-CM | POA: Diagnosis not present

## 2022-12-25 DIAGNOSIS — Z483 Aftercare following surgery for neoplasm: Secondary | ICD-10-CM | POA: Diagnosis not present

## 2022-12-25 NOTE — Therapy (Addendum)
OUTPATIENT PHYSICAL THERAPY UE ONCOLOGY EVALUATION   Patient Name: Morgan Schmidt MRN: [id number] DOB:07-Feb-1951, 72 y.o., female Today's Date: 12/25/2022   PT End of Session - 12/25/22 1251     Visit Number 1    Number of Visits 5    Date for PT Re-Evaluation 01/22/23    PT Start Time 1203    PT Stop Time 1245    PT Time Calculation (min) 42 min    Activity Tolerance Patient tolerated treatment well    Behavior During Therapy [facility] for tasks assessed/performed              Past Medical History:  Diagnosis Date   AIN (anal intraepithelial neoplasia) anal canal    Cancer (HCC) 03/15/2022   breast   History of adenomatous polyp of colon    History of radiation therapy    Left breast- 05/01/22-05/22/22- Dr. Antony Blackbird   History of thyroid cancer 06/2008   s/p  total bilateral thyroidectomy for symptomatic multinodular goiter;   06-30-2008 s/p total thyroidectomy -- papillary thyroid cancer  and treated w/ RAI post surgery 09-29-2008   (last scan negative for recurrence in epic 03/ 2011)   Hyperlipidemia    Hypertension    Hypothyroidism, postsurgical 06/2008   and had RAI post surgery 02/ 2010;   followed by pcp   OA (osteoarthritis)    Type 2 diabetes mellitus treated with insulin (HCC)    followed by pcp   (11-23-2021  per pt checks daily in am and at times in afternoon,  fasting sugar--- 95--110)   Wears dentures    upper   Past Surgical History:  Procedure Laterality Date   ANAL INTRAEPITHELIAL NEOPLASIA EXCISION N/A 11/30/2021   Procedure: EXCISIONAL BIOPSY ANAL LESIONS;  Surgeon: Romie Levee, MD;  Location: [facility];  Service: General;  Laterality: N/A;   BREAST LUMPECTOMY WITH RADIOACTIVE SEED AND SENTINEL LYMPH NODE BIOPSY Left 03/22/2022   Procedure: LEFT BREAST LUMPECTOMY WITH RADIOACTIVE SEED AND SENTINEL LYMPH NODE BIOPSY;  Surgeon: Griselda Miner, MD;  Location: [facility];  Service: General;  Laterality: Left;  GEN & PEC  BLOCK   CESAREAN SECTION  1985   W/  BILATERAL TUBAL LIGATION   COLONOSCOPY WITH PROPOFOL  11/10/2021   by Dr.Pyrtle   HIGH RESOLUTION ANOSCOPY N/A 11/30/2021   Procedure: HIGH RESOLUTION ANOSCOPY;  Surgeon: Romie Levee, MD;  Location: [facility];  Service: General;  Laterality: N/A;   HIP PINNING,CANNULATED Left 03/10/2018   Procedure: CANNULATED HIP PINNING;  Surgeon: Durene Romans, MD;  Location: [facility];  Service: Orthopedics;  Laterality: Left;   KNEE ARTHROSCOPY Left 10/22/2016   Procedure: ARTHROSCOPY LEFT KNEE WITH DEBRIDEMENT;  Surgeon: Durene Romans, MD;  Location: [facility];  Service: Orthopedics;  Laterality: Left;   KNEE ARTHROSCOPY WITH MEDIAL MENISECTOMY  10/22/2016   Procedure: KNEE ARTHROSCOPY WITH MEDIAL MENISECTOMY;  Surgeon: Durene Romans, MD;  Location: [facility];  Service: Orthopedics;;   KNEE ARTHROSCOPY WITH SUBCHONDROPLASTY Left 10/22/2016   Procedure: KNEE ARTHROSCOPY WITH MEDIAL CHONDROPLASTY;  Surgeon: Durene Romans, MD;  Location: [facility];  Service: Orthopedics;  Laterality: Left;   TOTAL THYROIDECTOMY Bilateral 06/30/2008   @[facility]  by dr Johna Sheriff   Patient Active Problem List   Diagnosis Date Noted   Genetic testing 03/20/2022   Malignant neoplasm of upper-outer quadrant of left breast in female, estrogen receptor positive (HCC) 02/26/2022   Pain in left knee 07/01/2018  Controlled diabetes mellitus type 2 with complications (HCC) 03/19/2018   Essential hypertension 03/23/2016   Other specified hypothyroidism 12/16/2014   Mixed diabetic hyperlipidemia associated with type 2 diabetes mellitus (HCC) 12/16/2014   Long term current use of insulin (HCC) 12/16/2014   Polyposis of colon 12/16/2014    PCP: Mechele Claude, MD  REFERRING PROVIDER: Serena Croissant, MD  REFERRING DIAG: [phone] (ICD-10-CM) - Malignant neoplasm of upper-outer quadrant of left breast in female, estrogen receptor  positive (HCC)   THERAPY DIAG:  Lymphedema, not elsewhere classified  Aftercare following surgery for neoplasm  Abnormal posture  Malignant neoplasm of upper-outer quadrant of left breast in female, estrogen receptor positive (HCC)  Rationale for Evaluation and Treatment Rehabilitation  ONSET DATE: 02/21/22  SUBJECTIVE                                                                                                                                                                                           SUBJECTIVE STATEMENT: I think my breast is swelling. It seems like it started this week. It feels hard. My last SOZO was also in the yellow.   PERTINENT HISTORY:  Patient was diagnosed on 02/21/22 with left grade 2. It measures 0.7 cm and is located in the upper-outer quadrant. It is ER+, PR+, HER2- with a Ki67 of 3%. Hx of rectal cancer and surgery in 12/2021. 03/22/22- L breast lumpectomy and SLNB 0/2, completed radiation 05/22/22  PATIENT GOALS  to decrease swelling   PAIN:  Are you having pain? No   PRECAUTIONS: at risk for lymphedema on L   HAND DOMINANCE: right  WEIGHT BEARING RESTRICTIONS No  FALLS:  Has patient fallen in last 6 months? 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   PRIOR LEVEL OF FUNCTION: Independent   OBJECTIVE  COGNITION:  Overall cognitive status: Within functional limits for tasks assessed    POSTURE:  Forward head and rounded shoulders posture  OBSERVATION: L breast visibly swollen with enlarged pores  PALPATION: increased fibrosis in inferior/medial breast  UPPER EXTREMITY AROM/PROM:  A/PROM RIGHT    03/07/22   Shoulder extension 75  Shoulder flexion 156  Shoulder abduction 160  Shoulder internal rotation 53   Shoulder external rotation 78    (Blank rows = not tested)  A/PROM LEFT   03/07/22 LEFT  12/25/22  Shoulder extension 72 68  Shoulder flexion 155 155  Shoulder abduction 179 175  Shoulder internal rotation 53 45  Shoulder external rotation 85 81    (Blank rows = not tested)   CERVICAL AROM: All within normal limits:    Percent limited  Flexion WFL  Extension WFL  Right lateral flexion WFL  Left lateral flexion WFL  Right rotation WFL  Left rotation WFL     UPPER EXTREMITY STRENGTH: 5/5   LYMPHEDEMA ASSESSMENTS:   LANDMARK RIGHT   03/07/22 RIGHT  12/25/22  10 cm proximal to olecranon process 29 29.2  Olecranon process 25 24  10  cm proximal to ulnar styloid process 23 22.5  Just proximal to ulnar styloid process 16.4 16.2  Across hand at thumb web space 20.5 20  At base of 2nd digit 6.6 6.6  (Blank rows = not tested)  LANDMARK LEFT   03/07/22 LEFT 12/25/22  10 cm proximal to olecranon process 28.5 27.5  Olecranon process 24.5 24  10  cm proximal to ulnar styloid process 23 21.8  Just proximal to ulnar styloid process 16 15.5  Across hand at thumb web space 19 19.5  At base of 2nd digit 6.5 6.6  (Blank rows = not tested)  BREAST COMPLAINTS SCALE: BREAST COMPLAINTS QUESTIONNAIRE Pain: 0 Heaviness: 1 Swollen feeling: 1 Tense Skin: 1 Redness: 1 Bra Print: 0 Size of Pores: 0 Hard feeling: 1 Total:  5   /80 A Score over 9 indicates lymphedema issues in the breast     PATIENT EDUCATION:  Education details: Lymphedema risk reduction and post op shoulder/posture HEP Person educated: Patient Education method: Explanation, Demonstration, Handout Education comprehension: Patient verbalized understanding and returned demonstration   HOME EXERCISE PROGRAM: Patient was instructed today in a home exercise program today for post op shoulder range of motion. These included active assist shoulder flexion in sitting, scapular retraction, wall walking with shoulder  abduction, and hands behind head external rotation.  She was encouraged to do these twice a day, holding 3 seconds and repeating 5 times when permitted by her physician.   ASSESSMENT:  CLINICAL IMPRESSION: Pt underwent a L lumpectomy and SLNB on 03/22/22. She completed radiation in Oct 2023. She reports over the last week she has noticed increased swelling in her L breast. Her L breast is swollen with increased pore size and fibrosis presents inferiorly and medially. She also had an increased SOZO score (yellow zone/subclinical). She is wearing her compression sleeve to see if she can reverse her L UE lymphedema and was educated to begin wearing her compression bra to help decrease L breast lymphedema. She would benefit from skilled PT services to decrease L breast lymphedema, decrease LUE lymphedema so her score returns to green (no lymphedema) and progress pt towards independent management.    PT treatment/interventions: ADL/self-care home management, pt/family education, therapeutic exercise, manual therapy, compression bandaging, manual lymphatic drainage, orthotic fit    OBJECTIVE IMPAIRMENTS: decreased knowledge of condition, decreased knowledge of use of DME, increased edema, increased fascial restrictions, and postural dysfunction.   ACTIVITY LIMITATIONS: none  PARTICIPATION LIMITATIONS: none  PERSONAL FACTORS: none are also affecting patient's functional outcome.   REHAB POTENTIAL: Good  CLINICAL DECISION MAKING: Stable/uncomplicated  EVALUATION COMPLEXITY: Low  GOALS: Goals reviewed with patient? Yes  SHORT TERM GOALS=LONG TERM GOALS Target date: 01/22/23  Pt will be independent in self MLD for long term management of L breast and LUE lymphedema.  Baseline: Goal status: INITIAL  2.  Pt will be able to manage her lymphedema through self MLD and compression garments.  Baseline:  Goal status:  INITIAL  3.  Pt will return to green on ldex screening to indicate reversal of LUE  lymphedema.  Baseline:  Goal status: INITIAL  4.  Pt will report a 50% improvement in fibrosis of L breast to decrease risk of infection.  Baseline:  Goal status: INITIAL  PLAN: PT FREQUENCY/DURATION: 1x/wk for 4 wks  PLAN FOR NEXT SESSION: instruct in MLD for L breast and LUE and have pt return demo, how is compression bra?     [facility] [place], PT 12/25/2022, 1:00 PM

## 2023-01-01 ENCOUNTER — Ambulatory Visit: Payer: Medicare HMO

## 2023-01-01 DIAGNOSIS — I890 Lymphedema, not elsewhere classified: Secondary | ICD-10-CM | POA: Diagnosis not present

## 2023-01-01 DIAGNOSIS — R293 Abnormal posture: Secondary | ICD-10-CM

## 2023-01-01 DIAGNOSIS — Z170 Estrogen receptor positive status [ER+]: Secondary | ICD-10-CM

## 2023-01-01 DIAGNOSIS — C50412 Malignant neoplasm of upper-outer quadrant of left female breast: Secondary | ICD-10-CM | POA: Diagnosis not present

## 2023-01-01 DIAGNOSIS — Z483 Aftercare following surgery for neoplasm: Secondary | ICD-10-CM | POA: Diagnosis not present

## 2023-01-01 NOTE — Patient Instructions (Signed)
Cancer Rehab [phone]  Operating Room Services yourself.  Do circles at your neck just above your collarbones.  Repeat this 10 times.  Deep Effective Breath   Standing, sitting, or laying down, place both hands on the belly. Take a deep breath IN, expanding the belly; then breath OUT, contracting the belly. Repeat __5__ times. Do __2-3__ sessions per day and before your self massage.   Axilla to Axilla - Pump   On uninvolved side make 5 circles in the armpit, then pump _5__ times from involved armpit across chest to uninvolved armpit, making a pathway. Do _1__ time per day.  Copyright  VHI. All rights reserved.  Axilla to Inguinal Nodes - Sweep   On involved side, make 5 circles at groin at panty line, then pump _5__ times from armpit along side of trunk to outer hip, making your other pathway. Do __1_ time per day.   Breast Sequence Draw an imaginary diagonal line from upper outer breast through the nipple area toward lower inner breast.  Direct fluid upward and inward from this line toward the pathway across your upper chest .  Do this in three rows to treat all of the upper inner breast tissue, and do each row 3-4x.      Direct fluid to treat all of lower outer breast tissue downward and outward toward      pathway that is aimed at the left groin.  Copyright  VHI. All rights reserved.  Arm Posterior: Elbow to Shoulder - Pump   Pump _5__ times from back of elbow to top of shoulder. Then inner to outer upper arm _5_ times, then outer arm again _5_ times. Then back to the pathways _2-3_ times. Do _1__ time per day.  Copyright  VHI. All rights reserved.  ARM: Volar Wrist to Elbow - Sweep   Pump or stationary circles _5__ times from wrist to elbow making sure to do both sides of the forearm. Then retrace your steps to the outer upper arm, and the pathways _2-3_ times each. Do _1__ time per day.  Copyright  VHI. All rights reserved.  ARM: Dorsum of Hand to Shoulder - Sweep   Pump or  stationary circles _5__ times on back of hand including knuckle spaces and individual fingers if needed working up towards the wrist, then retrace all your steps working back up the forearm, doing both sides; upper outer arm and back to your pathways _2-3_ times each. Then do 5 circles again at uninvolved armpit and involved groin where you started! Good job!! Do __1_ time per day.

## 2023-01-01 NOTE — Therapy (Addendum)
OUTPATIENT PHYSICAL THERAPY UE ONCOLOGY TREATMENT   Patient Name: Morgan Schmidt MRN: [id number] DOB:July 30, 1951, 72 y.o., female Today's Date: 01/01/2023   PT End of Session - 01/01/23 1111     Visit Number 2    Number of Visits 5    Date for PT Re-Evaluation 01/22/23    PT Start Time 1107    PT Stop Time 1202    PT Time Calculation (min) 55 min    Activity Tolerance Patient tolerated treatment well    Behavior During Therapy [facility] for tasks assessed/performed              Past Medical History:  Diagnosis Date   AIN (anal intraepithelial neoplasia) anal canal    Cancer (HCC) 03/15/2022   breast   History of adenomatous polyp of colon    History of radiation therapy    Left breast- 05/01/22-05/22/22- Dr. Antony Blackbird   History of thyroid cancer 06/2008   s/p  total bilateral thyroidectomy for symptomatic multinodular goiter;   06-30-2008 s/p total thyroidectomy -- papillary thyroid cancer  and treated w/ RAI post surgery 09-29-2008   (last scan negative for recurrence in epic 03/ 2011)   Hyperlipidemia    Hypertension    Hypothyroidism, postsurgical 06/2008   and had RAI post surgery 02/ 2010;   followed by pcp   OA (osteoarthritis)    Type 2 diabetes mellitus treated with insulin (HCC)    followed by pcp   (11-23-2021  per pt checks daily in am and at times in afternoon,  fasting sugar--- 95--110)   Wears dentures    upper   Past Surgical History:  Procedure Laterality Date   ANAL INTRAEPITHELIAL NEOPLASIA EXCISION N/A 11/30/2021   Procedure: EXCISIONAL BIOPSY ANAL LESIONS;  Surgeon: Romie Levee, MD;  Location: [facility] [place];  Service: General;  Laterality: N/A;   BREAST LUMPECTOMY WITH RADIOACTIVE SEED AND SENTINEL LYMPH NODE BIOPSY Left 03/22/2022   Procedure: LEFT BREAST LUMPECTOMY WITH RADIOACTIVE SEED AND SENTINEL LYMPH NODE BIOPSY;  Surgeon: Griselda Miner, MD;  Location: [facility];  Service: General;  Laterality: Left;  GEN & PEC  BLOCK   CESAREAN SECTION  1985   W/  BILATERAL TUBAL LIGATION   COLONOSCOPY WITH PROPOFOL  11/10/2021   by Dr.Pyrtle   HIGH RESOLUTION ANOSCOPY N/A 11/30/2021   Procedure: HIGH RESOLUTION ANOSCOPY;  Surgeon: Romie Levee, MD;  Location: [facility];  Service: General;  Laterality: N/A;   HIP PINNING,CANNULATED Left 03/10/2018   Procedure: CANNULATED HIP PINNING;  Surgeon: Durene Romans, MD;  [facility];  Service: Orthopedics;  Laterality: Left;   KNEE ARTHROSCOPY Left 10/22/2016   Procedure: ARTHROSCOPY LEFT KNEE WITH DEBRIDEMENT;  Surgeon: Durene Romans, MD;  Location: [facility];  Service: Orthopedics;  Laterality: Left;   KNEE ARTHROSCOPY WITH MEDIAL MENISECTOMY  10/22/2016   Procedure: KNEE ARTHROSCOPY WITH MEDIAL MENISECTOMY;  Surgeon: Durene Romans, MD;  Location: [facility];  Service: Orthopedics;;   KNEE ARTHROSCOPY WITH SUBCHONDROPLASTY Left 10/22/2016   Procedure: KNEE ARTHROSCOPY WITH MEDIAL CHONDROPLASTY;  Surgeon: Durene Romans, MD;  Location: [facility];  Service: Orthopedics;  Laterality: Left;   TOTAL THYROIDECTOMY Bilateral 06/30/2008   @[facility]  by dr Johna Sheriff   Patient Active Problem List   Diagnosis Date Noted   Genetic testing 03/20/2022   Malignant neoplasm of upper-outer quadrant of left breast in female, estrogen receptor positive (HCC) 02/26/2022   Pain in left knee 07/01/2018  Controlled diabetes mellitus type 2 with complications (HCC) 03/19/2018   Essential hypertension 03/23/2016   Other specified hypothyroidism 12/16/2014   Mixed diabetic hyperlipidemia associated with type 2 diabetes mellitus (HCC) 12/16/2014   Long term current use of insulin (HCC) 12/16/2014   Polyposis of colon 12/16/2014    PCP: Mechele Claude, MD  REFERRING PROVIDER: Serena Croissant, MD  REFERRING DIAG: [phone] (ICD-10-CM) - Malignant neoplasm of upper-outer quadrant of left breast in female, estrogen receptor  positive (HCC)   THERAPY DIAG:  Lymphedema, not elsewhere classified  Aftercare following surgery for neoplasm  Abnormal posture  Malignant neoplasm of upper-outer quadrant of left breast in female, estrogen receptor positive (HCC)  Rationale for Evaluation and Treatment Rehabilitation  ONSET DATE: 02/21/22  SUBJECTIVE                                                                                                                                                                                           SUBJECTIVE STATEMENT: I've started wearing the compression bra since Blaire told me to last week and I can already tell my breast is starting to feel better.   PERTINENT HISTORY:  Patient was diagnosed on 02/21/22 with left grade 2. It measures 0.7 cm and is located in the upper-outer quadrant. It is ER+, PR+, HER2- with a Ki67 of 3%. Hx of rectal cancer and surgery in 12/2021. 03/22/22- L breast lumpectomy and SLNB 0/2, completed radiation 05/22/22  PATIENT GOALS  to decrease swelling   PAIN:  Are you having pain? No   PRECAUTIONS: at risk for lymphedema on L   HAND DOMINANCE: right  WEIGHT BEARING RESTRICTIONS No  FALLS:  Has patient fallen in last 6 months? 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   PRIOR LEVEL OF FUNCTION: Independent   OBJECTIVE  COGNITION:  Overall cognitive status: Within functional limits for tasks assessed    POSTURE:  Forward head and rounded shoulders posture  OBSERVATION: L breast visibly swollen with enlarged pores  PALPATION: increased fibrosis in inferior/medial breast  UPPER EXTREMITY AROM/PROM:  A/PROM RIGHT    03/07/22   Shoulder extension 75  Shoulder flexion 156  Shoulder abduction 160  Shoulder internal  rotation 53  Shoulder external rotation 78    (Blank rows = not tested)  A/PROM LEFT   03/07/22 LEFT  12/25/22  Shoulder extension 72 68  Shoulder flexion 155 155  Shoulder abduction 179  175  Shoulder internal rotation 53 45  Shoulder external rotation 85 81    (Blank rows = not tested)   CERVICAL AROM: All within normal limits:    Percent limited  Flexion WFL  Extension WFL  Right lateral flexion WFL  Left lateral flexion WFL  Right rotation WFL  Left rotation WFL     UPPER EXTREMITY STRENGTH: 5/5   LYMPHEDEMA ASSESSMENTS:   LANDMARK RIGHT   03/07/22 RIGHT  12/25/22  10 cm proximal to olecranon process 29 29.2  Olecranon process 25 24  10  cm proximal to ulnar styloid process 23 22.5  Just proximal to ulnar styloid process 16.4 16.2  Across hand at thumb web space 20.5 20  At base of 2nd digit 6.6 6.6  (Blank rows = not tested)  LANDMARK LEFT   03/07/22 LEFT 12/25/22  10 cm proximal to olecranon process 28.5 27.5  Olecranon process 24.5 24  10  cm proximal to ulnar styloid process 23 21.8  Just proximal to ulnar styloid process 16 15.5  Across hand at thumb web space 19 19.5  At base of 2nd digit 6.5 6.6  (Blank rows = not tested)  BREAST COMPLAINTS SCALE: BREAST COMPLAINTS QUESTIONNAIRE Pain: 0 Heaviness: 1 Swollen feeling: 1 Tense Skin: 1 Redness: 1 Bra Print: 0 Size of Pores: 0 Hard feeling: 1 Total:  5   /80 A Score over 9 indicates lymphedema issues in the breast  TODAY'S TREATMENT: 01/01/23: Manual Therapy MLD to Lt breast and UE in supine as follows: Short neck, 5 diaphragmatic breaths, Lt inguinal and Rt axillary nodes, Lt axillo-inguinal and anterior inter-axillary anastomosis, then Lt breast and Lt UE working from proximal to distal then briefly into Rt S/L to instruct pt how to better reach her lateral anastomosis as she struggled with reaching in supine across her body and then finished in supine retracing all steps instructing pt throughout and  having her return demo.    PATIENT EDUCATION:  Education details: Self MLD Person educated: Patient Education method: Explanation, Facilities manager, Handout Education comprehension: Patient verbalized understanding and returned demonstration   HOME EXERCISE PROGRAM: Patient was instructed today in a home exercise program today for post op shoulder range of motion. These included active assist shoulder flexion in sitting, scapular retraction, wall walking with shoulder abduction, and hands behind head external rotation.  She was encouraged to do these twice a day, holding 3 seconds and repeating 5 times when permitted by her physician.   ASSESSMENT:  CLINICAL IMPRESSION: First session of instructing pt in self MLD of Lt breast and UE. She did very well as she was able to start verbalizing next steps of sequence while therapist performed. She also returned good demo only requiring min VC's for lighter pressure but did well with skin stretch. Handout issued.   PT treatment/interventions: ADL/self-care home management, pt/family education, therapeutic exercise, manual therapy, compression bandaging, manual lymphatic drainage, orthotic fit   OBJECTIVE IMPAIRMENTS: decreased knowledge of condition, decreased knowledge of use of DME, increased edema, increased fascial restrictions, and postural dysfunction.   ACTIVITY LIMITATIONS: none  PARTICIPATION LIMITATIONS: none  PERSONAL FACTORS: none are also affecting patient's functional outcome.   REHAB POTENTIAL: Good  CLINICAL DECISION MAKING: Stable/uncomplicated  EVALUATION COMPLEXITY: Low  GOALS: Goals reviewed with patient? Yes  SHORT TERM GOALS=LONG TERM GOALS Target date: 01/22/23  Pt will be independent in self MLD for long term management of L breast and LUE lymphedema.  Baseline: Goal status: INITIAL  2.  Pt will be able  to manage her lymphedema through self MLD and compression garments.  Baseline:  Goal status: INITIAL  3.   Pt will return to green on ldex screening to indicate reversal of LUE lymphedema.  Baseline:  Goal status: INITIAL  4.  Pt will report a 50% improvement in fibrosis of L breast to decrease risk of infection.  Baseline:  Goal status: INITIAL  PLAN: PT FREQUENCY/DURATION: 1x/wk for 4 wks  PLAN FOR NEXT SESSION: Cont and review MLD for L breast and LUE and have pt return demo, how is compression bra?     Hermenia Bers, PTA 01/01/2023, 12:04 PM

## 2023-01-08 ENCOUNTER — Ambulatory Visit: Payer: Medicare HMO

## 2023-01-08 DIAGNOSIS — C50412 Malignant neoplasm of upper-outer quadrant of left female breast: Secondary | ICD-10-CM | POA: Diagnosis not present

## 2023-01-08 DIAGNOSIS — I890 Lymphedema, not elsewhere classified: Secondary | ICD-10-CM | POA: Diagnosis not present

## 2023-01-08 DIAGNOSIS — R293 Abnormal posture: Secondary | ICD-10-CM

## 2023-01-08 DIAGNOSIS — Z483 Aftercare following surgery for neoplasm: Secondary | ICD-10-CM | POA: Diagnosis not present

## 2023-01-08 DIAGNOSIS — Z170 Estrogen receptor positive status [ER+]: Secondary | ICD-10-CM | POA: Diagnosis not present

## 2023-01-08 NOTE — Therapy (Addendum)
OUTPATIENT PHYSICAL THERAPY UE ONCOLOGY TREATMENT   Patient Name: Morgan Schmidt MRN: [id number] DOB:05-Feb-1951, 72 y.o., female Today's Date: 01/08/2023   PT End of Session - 01/08/23 1008     Visit Number 3    Number of Visits 5    Date for PT Re-Evaluation 01/22/23    PT Start Time 1005    PT Stop Time 1100    PT Time Calculation (min) 55 min    Activity Tolerance Patient tolerated treatment well    Behavior During Therapy [facility] for tasks assessed/performed              Past Medical History:  Diagnosis Date   AIN (anal intraepithelial neoplasia) anal canal    Cancer (HCC) 03/15/2022   breast   History of adenomatous polyp of colon    History of radiation therapy    Left breast- 05/01/22-05/22/22- Dr. Antony Blackbird   History of thyroid cancer 06/2008   s/p  total bilateral thyroidectomy for symptomatic multinodular goiter;   06-30-2008 s/p total thyroidectomy -- papillary thyroid cancer  and treated w/ RAI post surgery 09-29-2008   (last scan negative for recurrence in epic 03/ 2011)   Hyperlipidemia    Hypertension    Hypothyroidism, postsurgical 06/2008   and had RAI post surgery 02/ 2010;   followed by pcp   OA (osteoarthritis)    Type 2 diabetes mellitus treated with insulin (HCC)    followed by pcp   (11-23-2021  per pt checks daily in am and at times in afternoon,  fasting sugar--- 95--110)   Wears dentures    upper   Past Surgical History:  Procedure Laterality Date   ANAL INTRAEPITHELIAL NEOPLASIA EXCISION N/A 11/30/2021   Procedure: EXCISIONAL BIOPSY ANAL LESIONS;  Surgeon: Romie Levee, MD;  Location: [facility] [place];  Service: General;  Laterality: N/A;   BREAST LUMPECTOMY WITH RADIOACTIVE SEED AND SENTINEL LYMPH NODE BIOPSY Left 03/22/2022   Procedure: LEFT BREAST LUMPECTOMY WITH RADIOACTIVE SEED AND SENTINEL LYMPH NODE BIOPSY;  Surgeon: Griselda Miner, MD;  Location: [facility];  Service: General;  Laterality: Left;  GEN & PEC  BLOCK   CESAREAN SECTION  1985   W/  BILATERAL TUBAL LIGATION   COLONOSCOPY WITH PROPOFOL  11/10/2021   by Dr.Pyrtle   HIGH RESOLUTION ANOSCOPY N/A 11/30/2021   Procedure: HIGH RESOLUTION ANOSCOPY;  Surgeon: Romie Levee, MD;  Location: [facility];  Service: General;  Laterality: N/A;   HIP PINNING,CANNULATED Left 03/10/2018   Procedure: CANNULATED HIP PINNING;  Surgeon: Durene Romans, MD;  Location: [facility] OR;  Service: Orthopedics;  Laterality: Left;   KNEE ARTHROSCOPY Left 10/22/2016   Procedure: ARTHROSCOPY LEFT KNEE WITH DEBRIDEMENT;  Surgeon: Durene Romans, MD;  Location: [facility];  Service: Orthopedics;  Laterality: Left;   KNEE ARTHROSCOPY WITH MEDIAL MENISECTOMY  10/22/2016   Procedure: KNEE ARTHROSCOPY WITH MEDIAL MENISECTOMY;  Surgeon: Durene Romans, MD;  Location: [facility];  Service: Orthopedics;;   KNEE ARTHROSCOPY WITH SUBCHONDROPLASTY Left 10/22/2016   Procedure: KNEE ARTHROSCOPY WITH MEDIAL CHONDROPLASTY;  Surgeon: Durene Romans, MD;  Location: [facility];  Service: Orthopedics;  Laterality: Left;   TOTAL THYROIDECTOMY Bilateral 06/30/2008   @[facility]  by dr Johna Sheriff   Patient Active Problem List   Diagnosis Date Noted   Genetic testing 03/20/2022   Malignant neoplasm of upper-outer quadrant of left breast in female, estrogen receptor positive (HCC) 02/26/2022   Pain in left knee 07/01/2018  Controlled diabetes mellitus type 2 with complications (HCC) 03/19/2018   Essential hypertension 03/23/2016   Other specified hypothyroidism 12/16/2014   Mixed diabetic hyperlipidemia associated with type 2 diabetes mellitus (HCC) 12/16/2014   Long term current use of insulin (HCC) 12/16/2014   Polyposis of colon 12/16/2014    PCP: Mechele Claude, MD  REFERRING PROVIDER: Serena Croissant, MD  REFERRING DIAG: [phone] (ICD-10-CM) - Malignant neoplasm of upper-outer quadrant of left breast in female, estrogen receptor  positive (HCC)   THERAPY DIAG:  Lymphedema, not elsewhere classified  Aftercare following surgery for neoplasm  Abnormal posture  Malignant neoplasm of upper-outer quadrant of left breast in female, estrogen receptor positive (HCC)  Rationale for Evaluation and Treatment Rehabilitation  ONSET DATE: 02/21/22  SUBJECTIVE                                                                                                                                                                                           SUBJECTIVE STATEMENT: I've been wearing the compression bra and sleeve every day. I can tell my breast is getting better. I did the self MLD since last week, but I know I do need more review with that.   PERTINENT HISTORY:  Patient was diagnosed on 02/21/22 with left grade 2. It measures 0.7 cm and is located in the upper-outer quadrant. It is ER+, PR+, HER2- with a Ki67 of 3%. Hx of rectal cancer and surgery in 12/2021. 03/22/22- L breast lumpectomy and SLNB 0/2, completed radiation 05/22/22  PATIENT GOALS  to decrease swelling   PAIN:  Are you having pain? No   PRECAUTIONS: at risk for lymphedema on L   HAND DOMINANCE: right  WEIGHT BEARING RESTRICTIONS No  FALLS:  Has patient fallen in last 6 months? 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   PRIOR LEVEL OF FUNCTION: Independent   OBJECTIVE  COGNITION:  Overall cognitive status: Within functional limits for tasks assessed    POSTURE:  Forward head and rounded shoulders posture  OBSERVATION: L breast visibly swollen with enlarged pores  PALPATION: increased fibrosis in inferior/medial breast  UPPER EXTREMITY AROM/PROM:  A/PROM RIGHT    03/07/22   Shoulder extension 75  Shoulder flexion 156   Shoulder abduction 160  Shoulder internal rotation 53  Shoulder external rotation 78    (Blank rows = not tested)  A/PROM LEFT   03/07/22 LEFT  12/25/22  Shoulder extension  72 68  Shoulder flexion 155 155  Shoulder abduction 179 175  Shoulder internal rotation 53 45  Shoulder external rotation 85 81    (Blank rows = not tested)   CERVICAL AROM: All within normal limits:    Percent limited  Flexion WFL  Extension WFL  Right lateral flexion WFL  Left lateral flexion WFL  Right rotation WFL  Left rotation WFL     UPPER EXTREMITY STRENGTH: 5/5   LYMPHEDEMA ASSESSMENTS:   LANDMARK RIGHT   03/07/22 RIGHT  12/25/22  10 cm proximal to olecranon process 29 29.2  Olecranon process 25 24  10  cm proximal to ulnar styloid process 23 22.5  Just proximal to ulnar styloid process 16.4 16.2  Across hand at thumb web space 20.5 20  At base of 2nd digit 6.6 6.6  (Blank rows = not tested)  LANDMARK LEFT   03/07/22 LEFT 12/25/22  10 cm proximal to olecranon process 28.5 27.5  Olecranon process 24.5 24  10  cm proximal to ulnar styloid process 23 21.8  Just proximal to ulnar styloid process 16 15.5  Across hand at thumb web space 19 19.5  At base of 2nd digit 6.5 6.6  (Blank rows = not tested)  BREAST COMPLAINTS SCALE: BREAST COMPLAINTS QUESTIONNAIRE Pain: 0 Heaviness: 1 Swollen feeling: 1 Tense Skin: 1 Redness: 1 Bra Print: 0 Size of Pores: 0 Hard feeling: 1 Total:  5   /80 A Score over 9 indicates lymphedema issues in the breast  TODAY'S TREATMENT: 01/08/23: Manual Therapy MLD to Lt breast and UE in supine as follows: Short neck, 5 diaphragmatic breaths, Lt inguinal and Rt axillary nodes, Lt axillo-inguinal and anterior inter-axillary anastomosis, then Lt breast and Lt UE working from proximal to distal then briefly into Rt S/L to instruct pt how to better reach her lateral anastomosis as she struggled with reaching in supine across her body and then finished in supine  retracing all steps and reviewing with pt while performing having her return demo, and then after performing sequence verbally reviewed with hand placement sequence again for clarity for pt.   01/01/23: Manual Therapy MLD to Lt breast and UE in supine as follows: Short neck, 5 diaphragmatic breaths, Lt inguinal and Rt axillary nodes, Lt axillo-inguinal and anterior inter-axillary anastomosis, then Lt breast and Lt UE working from proximal to distal then briefly into Rt S/L to instruct pt how to better reach her lateral anastomosis as she struggled with reaching in supine across her body and then finished in supine retracing all steps instructing pt throughout and having her return demo.    PATIENT EDUCATION:  Education details: Self MLD Person educated: Patient Education method: Explanation, Facilities manager, Handout Education comprehension: Patient verbalized understanding and returned demonstration   HOME EXERCISE PROGRAM: Patient was instructed today in a home exercise program today for post op shoulder range of motion. These included active assist shoulder flexion in sitting, scapular retraction, wall walking with shoulder abduction, and hands behind head external rotation.  She was encouraged to do these twice a day, holding 3 seconds and repeating 5 times when permitted by her physician.; 01/01/23 - self MLD   ASSESSMENT:  CLINICAL IMPRESSION: Continued MLD to Lt breast and UE. Reviewed sequence with pt while performing and had her return demo to assess technique. She required mild VC's for lighter pressure with her fingers as she was squeezing some, and hand over hand technique used for full skin stretch. Pt was able to return improved demo after further  reinforcement of technique. Encouraged her to cont self MLD 1-2x/daily until SOZO next week. Pt will benefit from further review and SOZO reassessed next week.    PT treatment/interventions: ADL/self-care home management, pt/family  education, therapeutic exercise, manual therapy, compression bandaging, manual lymphatic drainage, orthotic fit   OBJECTIVE IMPAIRMENTS: decreased knowledge of condition, decreased knowledge of use of DME, increased edema, increased fascial restrictions, and postural dysfunction.   ACTIVITY LIMITATIONS: none  PARTICIPATION LIMITATIONS: none  PERSONAL FACTORS: none are also affecting patient's functional outcome.   REHAB POTENTIAL: Good  CLINICAL DECISION MAKING: Stable/uncomplicated  EVALUATION COMPLEXITY: Low  GOALS: Goals reviewed with patient? Yes  SHORT TERM GOALS=LONG TERM GOALS Target date: 01/22/23  Pt will be independent in self MLD for long term management of L breast and LUE lymphedema.  Baseline: Goal status: INITIAL  2.  Pt will be able to manage her lymphedema through self MLD and compression garments.  Baseline:  Goal status: INITIAL  3.  Pt will return to green on ldex screening to indicate reversal of LUE lymphedema.  Baseline:  Goal status: INITIAL  4.  Pt will report a 50% improvement in fibrosis of L breast to decrease risk of infection.  Baseline:  Goal status: INITIAL  PLAN: PT FREQUENCY/DURATION: 1x/wk for 4 wks  PLAN FOR NEXT SESSION: Cont and review MLD for L breast and LUE and have pt return demo; redo SOZO next as she will be >28 days since last assessed     Hermenia Bers, PTA 01/08/2023, 11:25 AM

## 2023-01-10 ENCOUNTER — Ambulatory Visit: Payer: Medicare HMO | Admitting: Family Medicine

## 2023-01-15 ENCOUNTER — Ambulatory Visit: Payer: Medicare HMO

## 2023-01-15 VITALS — Wt 174.0 lb

## 2023-01-15 DIAGNOSIS — Z170 Estrogen receptor positive status [ER+]: Secondary | ICD-10-CM | POA: Diagnosis not present

## 2023-01-15 DIAGNOSIS — C50412 Malignant neoplasm of upper-outer quadrant of left female breast: Secondary | ICD-10-CM

## 2023-01-15 DIAGNOSIS — Z483 Aftercare following surgery for neoplasm: Secondary | ICD-10-CM | POA: Diagnosis not present

## 2023-01-15 DIAGNOSIS — R293 Abnormal posture: Secondary | ICD-10-CM

## 2023-01-15 DIAGNOSIS — I890 Lymphedema, not elsewhere classified: Secondary | ICD-10-CM

## 2023-01-15 NOTE — Therapy (Signed)
OUTPATIENT PHYSICAL THERAPY UE ONCOLOGY TREATMENT   Patient Name: Morgan Schmidt MRN: [id number] DOB:[birth year], 72 y.o., female Today's Date: 01/15/2023   PT End of Session - 01/15/23 1107     Visit Number 4    Number of Visits 5    Date for PT Re-Evaluation 01/22/23    PT Start Time 1104    PT Stop Time 1204    PT Time Calculation (min) 60 min              Past Medical History:  Diagnosis Date   AIN (anal intraepithelial neoplasia) anal canal    Cancer (HCC) 03/15/2022   breast   History of adenomatous polyp of colon    History of radiation therapy    Left breast- 05/01/22-05/22/22- Dr. Antony Blackbird   History of thyroid cancer 06/2008   s/p  total bilateral thyroidectomy for symptomatic multinodular goiter;   06-30-2008 s/p total thyroidectomy -- papillary thyroid cancer  and treated w/ RAI post surgery 09-29-2008   (last scan negative for recurrence in epic 03/ 2011)   Hyperlipidemia    Hypertension    Hypothyroidism, postsurgical 06/2008   and had RAI post surgery 02/ 2010;   followed by pcp   OA (osteoarthritis)    Type 2 diabetes mellitus treated with insulin (HCC)    followed by pcp   (11-23-2021  per pt checks daily in am and at times in afternoon,  fasting sugar--- 95--110)   Wears dentures    upper   Past Surgical History:  Procedure Laterality Date   ANAL INTRAEPITHELIAL NEOPLASIA EXCISION N/A 11/30/2021   Procedure: EXCISIONAL BIOPSY ANAL LESIONS;  Surgeon: Romie Levee, MD;  Location: [facility];  Service: General;  Laterality: N/A;   BREAST LUMPECTOMY WITH RADIOACTIVE SEED AND SENTINEL LYMPH NODE BIOPSY Left 03/22/2022   Procedure: LEFT BREAST LUMPECTOMY WITH RADIOACTIVE SEED AND SENTINEL LYMPH NODE BIOPSY;  Surgeon: Griselda Miner, MD;  Location: [facility];  Service: General;  Laterality: Left;  GEN & PEC BLOCK   CESAREAN SECTION  1985   W/  BILATERAL TUBAL LIGATION   COLONOSCOPY WITH PROPOFOL  11/10/2021   by  Dr.Pyrtle   HIGH RESOLUTION ANOSCOPY N/A 11/30/2021   Procedure: HIGH RESOLUTION ANOSCOPY;  Surgeon: Romie Levee, MD;  Location: [facility];  Service: General;  Laterality: N/A;   HIP PINNING,CANNULATED Left 03/10/2018   Procedure: CANNULATED HIP PINNING;  Surgeon: Durene Romans, MD;  Location: [facility] OR;  Service: Orthopedics;  Laterality: Left;   KNEE ARTHROSCOPY Left 10/22/2016   Procedure: ARTHROSCOPY LEFT KNEE WITH DEBRIDEMENT;  Surgeon: Durene Romans, MD;  Location: [facility];  Service: Orthopedics;  Laterality: Left;   KNEE ARTHROSCOPY WITH MEDIAL MENISECTOMY  10/22/2016   Procedure: KNEE ARTHROSCOPY WITH MEDIAL MENISECTOMY;  Surgeon: Durene Romans, MD;  Location: [facility];  Service: Orthopedics;;   KNEE ARTHROSCOPY WITH SUBCHONDROPLASTY Left 10/22/2016   Procedure: KNEE ARTHROSCOPY WITH MEDIAL CHONDROPLASTY;  Surgeon: Durene Romans, MD;  Location: [facility];  Service: Orthopedics;  Laterality: Left;   TOTAL THYROIDECTOMY Bilateral 06/30/2008   @[facility]  by dr Johna Sheriff   Patient Active Problem List   Diagnosis Date Noted   Genetic testing 03/20/2022   Malignant neoplasm of upper-outer quadrant of left breast in female, estrogen receptor positive (HCC) 02/26/2022   Pain in left knee 07/01/2018   Controlled diabetes mellitus type 2 with complications (HCC) 03/19/2018   Essential hypertension 03/23/2016   Other specified hypothyroidism  12/16/2014   Mixed diabetic hyperlipidemia associated with type 2 diabetes mellitus (HCC) 12/16/2014   Long term current use of insulin (HCC) 12/16/2014   Polyposis of colon 12/16/2014    PCP: Mechele Claude, MD  REFERRING PROVIDER: Serena Croissant, MD  REFERRING DIAG: [phone] (ICD-10-CM) - Malignant neoplasm of upper-outer quadrant of left breast in female, estrogen receptor positive (HCC)   THERAPY DIAG:  Lymphedema, not elsewhere classified  Aftercare following surgery for  neoplasm  Abnormal posture  Malignant neoplasm of upper-outer quadrant of left breast in female, estrogen receptor positive (HCC)  Rationale for Evaluation and Treatment Rehabilitation  ONSET DATE: 02/21/22  SUBJECTIVE                                                                                                                                                                                           SUBJECTIVE STATEMENT: I've been wearing the compression bra and sleeve every day. I can tell my breast is getting better. I did the self MLD since last week, but I know I do need more review with that.   PERTINENT HISTORY:  Patient was diagnosed on 02/21/22 with left grade 2. It measures 0.7 cm and is located in the upper-outer quadrant. It is ER+, PR+, HER2- with a Ki67 of 3%. Hx of rectal cancer and surgery in 12/2021. 03/22/22- L breast lumpectomy and SLNB 0/2, completed radiation 05/22/22  PATIENT GOALS  to decrease swelling   PAIN:  Are you having pain? No   PRECAUTIONS: at risk for lymphedema on L   HAND DOMINANCE: right  WEIGHT BEARING RESTRICTIONS No  FALLS:  Has patient fallen in last 6 months? 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   PRIOR LEVEL OF FUNCTION: Independent   OBJECTIVE  COGNITION:  Overall cognitive status: Within functional limits for tasks assessed    POSTURE:  Forward head and rounded shoulders posture  OBSERVATION: L breast visibly swollen with enlarged pores  PALPATION: increased fibrosis in inferior/medial breast  UPPER EXTREMITY AROM/PROM:  A/PROM RIGHT    03/07/22   Shoulder extension 75  Shoulder flexion 156  Shoulder abduction 160  Shoulder internal rotation 53  Shoulder external rotation 78    (Blank rows =  not tested)  A/PROM LEFT   03/07/22 LEFT  12/25/22  Shoulder extension 72 68  Shoulder flexion 155 155  Shoulder abduction 179 175  Shoulder internal rotation 53 45  Shoulder external rotation 85 81    (Blank rows = not tested)   CERVICAL AROM: All within normal limits:    Percent limited  Flexion WFL  Extension WFL  Right lateral flexion WFL  Left lateral flexion WFL  Right rotation WFL  Left rotation WFL     UPPER EXTREMITY STRENGTH: 5/5   LYMPHEDEMA ASSESSMENTS:   LANDMARK RIGHT   03/07/22 RIGHT  12/25/22  10 cm proximal to olecranon process 29 29.2  Olecranon process 25 24  10  cm proximal to ulnar styloid process 23 22.5  Just proximal to ulnar styloid process 16.4 16.2  Across hand at thumb web space 20.5 20  At base of 2nd digit 6.6 6.6  (Blank rows = not tested)  LANDMARK LEFT   03/07/22 LEFT 12/25/22  10 cm proximal to olecranon process 28.5 27.5  Olecranon process 24.5 24  10  cm proximal to ulnar styloid process 23 21.8  Just proximal to ulnar styloid process 16 15.5  Across hand at thumb web space 19 19.5  At base of 2nd digit 6.5 6.6  (Blank rows = not tested)  BREAST COMPLAINTS SCALE: BREAST COMPLAINTS QUESTIONNAIRE Pain: 0 Heaviness: 1 Swollen feeling: 1 Tense Skin: 1 Redness: 1 Bra Print: 0 Size of Pores: 0 Hard feeling: 1 Total:  5   /80 A Score over 9 indicates lymphedema issues in the breast  TODAY'S TREATMENT: 01/15/23: Redid SOZO today and pt was still elevated in the yellow so added compression bandaging to help improve lymphatic drainage of Lt UE. Manual Therapy Compression Bandaging to Lt UE as follows: Cocoa butter, thin stockinette, Elastomull to fingers 1-4, Artiflex x 1 from hand to axilla, 1-6 cm to hand, 1-8 cm from wrist for "X" at elbow, and 1-10 cm short stretch compression bandage from wrist to axilla. Educated pt while performing this today and had her return demo of each bandage after therapist demo.  Handout also issued  for this  01/08/23: Manual Therapy MLD to Lt breast and UE in supine as follows: Short neck, 5 diaphragmatic breaths, Lt inguinal and Rt axillary nodes, Lt axillo-inguinal and anterior inter-axillary anastomosis, then Lt breast and Lt UE working from proximal to distal then briefly into Rt S/L to instruct pt how to better reach her lateral anastomosis as she struggled with reaching in supine across her body and then finished in supine retracing all steps and reviewing with pt while performing having her return demo, and then after performing sequence verbally reviewed with hand placement sequence again for clarity for pt.   01/01/23: Manual Therapy MLD to Lt breast and UE in supine as follows: Short neck, 5 diaphragmatic breaths, Lt inguinal and Rt axillary nodes, Lt axillo-inguinal and anterior inter-axillary anastomosis, then Lt breast and Lt UE working from proximal to distal then briefly into Rt S/L to instruct pt how to better reach her lateral anastomosis as she struggled with reaching in supine across her body and then finished in supine retracing all steps instructing pt throughout and having her return demo.    PATIENT EDUCATION:  Education details: Compression bandaging Person educated: Patient Education method: Explanation, Demonstration, Handout Education comprehension: Patient verbalized understanding and returned demonstration; will benefit from further review   HOME EXERCISE PROGRAM: Patient was instructed today in a home exercise program today for post op shoulder range of motion. These included active assist shoulder flexion in sitting, scapular retraction, wall walking with shoulder abduction, and hands behind head external rotation.  She was encouraged to do  these twice a day, holding 3 seconds and repeating 5 times when permitted by her physician.; 01/01/23 - self MLD; 01/15/23 - compression bandaging   ASSESSMENT:  CLINICAL IMPRESSION: Continued MLD to Lt breast and UE.  Reviewed sequence with pt while performing and had her return demo to assess technique. She required mild VC's for lighter pressure with her fingers as she was squeezing some, and hand over hand technique used for full skin stretch. Pt was able to return improved demo after further reinforcement of technique. Encouraged her to cont self MLD 1-2x/daily until SOZO next week. Pt will benefit from further review and SOZO reassessed next week.    PT treatment/interventions: ADL/self-care home management, pt/family education, therapeutic exercise, manual therapy, compression bandaging, manual lymphatic drainage, orthotic fit   OBJECTIVE IMPAIRMENTS: decreased knowledge of condition, decreased knowledge of use of DME, increased edema, increased fascial restrictions, and postural dysfunction.   ACTIVITY LIMITATIONS: none  PARTICIPATION LIMITATIONS: none  PERSONAL FACTORS: none are also affecting patient's functional outcome.   REHAB POTENTIAL: Good  CLINICAL DECISION MAKING: Stable/uncomplicated  EVALUATION COMPLEXITY: Low  GOALS: Goals reviewed with patient? Yes  SHORT TERM GOALS=LONG TERM GOALS Target date: 01/22/23  Pt will be independent in self MLD for long term management of L breast and LUE lymphedema.  Baseline: Goal status: INITIAL  2.  Pt will be able to manage her lymphedema through self MLD and compression garments.  Baseline:  Goal status: INITIAL  3.  Pt will return to green on ldex screening to indicate reversal of LUE lymphedema.  Baseline:  Goal status: INITIAL  4.  Pt will report a 50% improvement in fibrosis of L breast to decrease risk of infection.  Baseline:  Goal status: INITIAL  PLAN: PT FREQUENCY/DURATION: 1x/wk for 4 wks  PLAN FOR NEXT SESSION: Cont and review MLD for L breast and LUE and have pt return demo; redo SOZO next as she will be >28 days since last assessed     Hermenia Bers, PTA 01/15/2023, 1:19 PM

## 2023-01-15 NOTE — Addendum Note (Signed)
Addended by: Leonette Most L on: 01/15/2023 03:33 PM   Modules accepted: Orders

## 2023-01-17 ENCOUNTER — Ambulatory Visit (INDEPENDENT_AMBULATORY_CARE_PROVIDER_SITE_OTHER): Payer: Medicare HMO | Admitting: Family Medicine

## 2023-01-17 ENCOUNTER — Encounter: Payer: Self-pay | Admitting: Family Medicine

## 2023-01-17 VITALS — BP 162/74 | HR 76 | Temp 96.8°F | Ht 59.0 in | Wt 172.0 lb

## 2023-01-17 DIAGNOSIS — E782 Mixed hyperlipidemia: Secondary | ICD-10-CM

## 2023-01-17 DIAGNOSIS — Z794 Long term (current) use of insulin: Secondary | ICD-10-CM

## 2023-01-17 DIAGNOSIS — E039 Hypothyroidism, unspecified: Secondary | ICD-10-CM | POA: Diagnosis not present

## 2023-01-17 DIAGNOSIS — E1169 Type 2 diabetes mellitus with other specified complication: Secondary | ICD-10-CM

## 2023-01-17 DIAGNOSIS — I10 Essential (primary) hypertension: Secondary | ICD-10-CM | POA: Diagnosis not present

## 2023-01-17 DIAGNOSIS — E118 Type 2 diabetes mellitus with unspecified complications: Secondary | ICD-10-CM | POA: Diagnosis not present

## 2023-01-17 LAB — BAYER DCA HB A1C WAIVED: HB A1C (BAYER DCA - WAIVED): 8 % — ABNORMAL HIGH (ref 4.8–5.6)

## 2023-01-17 MED ORDER — OLMESARTAN MEDOXOMIL-HCTZ 40-25 MG PO TABS: 1.0000 | ORAL_TABLET | Freq: Every day | ORAL | 1 refills | Status: DC

## 2023-01-17 MED ORDER — ROSUVASTATIN CALCIUM 20 MG PO TABS: ORAL_TABLET | ORAL | 3 refills | Status: DC

## 2023-01-17 NOTE — Progress Notes (Signed)
Subjective:  [name],  female    DOB: 01/23/1951  Age: 72 y.o.    CC: Medical Management of Chronic Issues   HPI Morgan Schmidt presents for  follow-up of hypertension. Patient has no history of headache chest pain or shortness of breath or recent cough. Patient also denies symptoms of TIA such as numbness weakness lateralizing. Patient denies side effects from medication. States taking it regularly.  Patient also  in for follow-up of elevated cholesterol. Doing well without complaints on current medication. Denies side effects  including myalgia and arthralgia and nausea. Also in today for liver function testing. Currently no chest pain, shortness of breath or other cardiovascular related symptoms noted.  Follow-up of diabetes. Patient does check blood sugar at home. Readings run between 90 and 120 Patient denies symptoms such as excessive hunger or urinary frequency, excessive hunger, nausea No significant hypoglycemic spells noted. Medications reviewed. Pt reports taking them regularly. Pt. denies complication/adverse reaction today.    follow-up on  thyroid. The patient has a history of hypothyroidism for many years. It has been stable recently. Pt. denies any change in  voice, loss of hair, heat or cold intolerance. Energy level has been adequate to good. Patient denies constipation and diarrhea. No myxedema. Medication is as noted below. Verified that pt is taking it daily on an empty stomach. Well tolerated.    History Morgan Schmidt has a past medical history of AIN (anal intraepithelial neoplasia) anal canal, Cancer (HCC) (03/15/2022), History of adenomatous polyp of colon, History of radiation therapy, History of thyroid cancer (06/2008), Hyperlipidemia, Hypertension, Hypothyroidism, postsurgical (06/2008), OA (osteoarthritis), Type 2 diabetes mellitus treated with insulin (HCC), and Wears dentures.   She has a past surgical history that includes Total  thyroidectomy (Bilateral, 06/30/2008); Cesarean section (1985); Knee arthroscopy (Left, 10/22/2016); Knee arthroscopy with medial menisectomy (10/22/2016); Knee arthroscopy with subchondroplasty (Left, 10/22/2016); Hip pinning, cannulated (Left, 03/10/2018); Colonoscopy with propofol (11/10/2021); Anal intraepithelial neoplasia excision (N/A, 11/30/2021); High resolution anoscopy (N/A, 11/30/2021); and Breast lumpectomy with radioactive seed and sentinel lymph node biopsy (Left, 03/22/2022).   Her family history includes Arthritis in her father; Heart disease in her mother.She reports that she quit smoking about 22 years ago. Her smoking use included cigarettes. She has a 2.50 pack-year smoking history. She has never used smokeless tobacco. She reports that she does not drink alcohol and does not use drugs.  Current Outpatient Medications on File Prior to Visit  Medication Sig Dispense Refill   Accu-Chek FastClix Lancets MISC Use to check Blood Sugars twice daily. Dx E11.8 204 each 3   acetaminophen (TYLENOL) 325 MG tablet Take 325-650 mg by mouth every 6 (six) hours as needed (for pain or headaches).     Alcohol Swabs (B-D SINGLE USE SWABS REGULAR) PADS Use to check Blood Sugars twice daily. Dx E11.8 200 each 3   alendronate (FOSAMAX) 70 MG tablet Take 1 tablet (70 mg total) by mouth every 7 (seven) days. Take with a full glass of water on an empty stomach. Do not lay down for at least 2 hours 13 tablet 3   anastrozole (ARIMIDEX) 1 MG tablet Take 1 tablet (1 mg total) by mouth daily. 90 tablet 3   Blood Glucose Calibration (ACCU-CHEK AVIVA) SOLN Use to check Blood Sugars twice daily. Dx E11.8 1 each 3   Blood Glucose Monitoring Suppl (ACCU-CHEK AVIVA PLUS) w/Device KIT Use to check Blood Sugars twice daily. Dx E11.8 1 kit 0   Cholecalciferol (VITAMIN D)  2000 UNITS tablet Take 1 tablet (2,000 Units total) by mouth daily. (Patient taking differently: Take 2,000 Units by mouth every evening.) 30 tablet 11    diclofenac sodium (VOLTAREN) 1 % GEL Apply 2-4 g topically See admin instructions. Apply 2-4 grams to each hand up to 4 times a day as needed for arthritis in the fingers (Patient taking differently: Apply 2-4 g topically See admin instructions. Apply 2-4 grams to each hand up to 4 times a day as needed for arthritis in the fingers) 100 g 11   docusate sodium (COLACE) 100 MG capsule Take 1 capsule (100 mg total) by mouth 2 (two) times daily. 180 capsule 3   Dulaglutide (TRULICITY) 4.5 MG/0.5ML SOPN Inject 4.5 mg as directed once a week. Monday's 6.5 mL 3   glucose blood (ACCU-CHEK AVIVA PLUS) test strip TEST BLOOD SUGAR TWICE DAILY Dx E11.8 200 strip 3   insulin glargine (LANTUS SOLOSTAR) 100 UNIT/ML Solostar Pen Inject 110 Units into the skin daily after breakfast. 105 mL 3   Insulin Pen Needle (DROPLET PEN NEEDLES) 31G X 8 MM MISC USE AS DIRECTED EVERY DAY Dx E11.8 100 each 3   levothyroxine (SYNTHROID) 112 MCG tablet Take 1 tablet (112 mcg total) by mouth daily. DOSE CHANGE 90 tablet 3   metFORMIN (GLUCOPHAGE) 1000 MG tablet Take 1 tablet (1,000 mg total) by mouth 2 (two) times daily with a meal. 180 tablet 3   No current facility-administered medications on file prior to visit.    ROS Review of Systems  Constitutional: Negative.   HENT: Negative.    Eyes:  Negative for visual disturbance.  Respiratory:  Negative for shortness of breath.   Cardiovascular:  Negative for chest pain.  Gastrointestinal:  Negative for abdominal pain.  Musculoskeletal:  Negative for arthralgias.    Objective:  BP (!) 162/74   Pulse 76   Temp (!) 96.8 F (36 C)   Ht 4\' 11"  (1.499 m)   Wt 172 lb (78 kg)   LMP 03/02/2004   SpO2 98%   BMI 34.74 kg/m   BP Readings from Last 3 Encounters:  01/17/23 (!) 162/74  10/11/22 (!) 142/65  10/04/22 (!) 176/71    Wt Readings from Last 3 Encounters:  01/17/23 172 lb (78 kg)  01/15/23 174 lb (78.9 kg)  12/12/22 168 lb (76.2 kg)     Physical  Exam Constitutional:      General: She is not in acute distress.    Appearance: She is well-developed.  Cardiovascular:     Rate and Rhythm: Normal rate and regular rhythm.  Pulmonary:     Breath sounds: Normal breath sounds.  Musculoskeletal:        General: Normal range of motion.  Skin:    General: Skin is warm and dry.  Neurological:     Mental Status: She is alert and oriented to person, place, and time.     Diabetic Foot Exam - Simple   No data filed     Lab Results  Component Value Date   HGBA1C 8.0 (H) 01/17/2023   HGBA1C 7.6 (H) 10/11/2022   HGBA1C 6.9 (H) 07/11/2022    Assessment & Plan:   Morgan Schmidt was seen today for medical management of chronic issues.  Diagnoses and all orders for this visit:  Essential hypertension -     CBC with Differential/Platelet -     CMP14+EGFR  Hypothyroidism, unspecified type -     TSH + free T4  Controlled type 2 diabetes mellitus  with complication, with long-term current use of insulin (HCC) -     Bayer DCA Hb A1c Waived  Mixed diabetic hyperlipidemia associated with type 2 diabetes mellitus (HCC) -     Lipid panel -     rosuvastatin (CRESTOR) 20 MG tablet; TAKE 1 TABLET EVERY DAY FOR CHOLESTEROL  Other orders -     olmesartan-hydrochlorothiazide (BENICAR HCT) 40-25 MG tablet; Take 1 tablet by mouth daily.   I have discontinued Ismahan Kumpf. Autry's olmesartan. I am also having her start on olmesartan-hydrochlorothiazide. Additionally, I am having her maintain her Vitamin D, acetaminophen, diclofenac sodium, Accu-Chek Aviva Plus, Accu-Chek Aviva, B-D SINGLE USE SWABS REGULAR, Accu-Chek FastClix Lancets, docusate sodium, Accu-Chek Aviva Plus, metFORMIN, Trulicity, levothyroxine, alendronate, Lantus SoloStar, Droplet Pen Needles, anastrozole, and rosuvastatin.  Meds ordered this encounter  Medications   rosuvastatin (CRESTOR) 20 MG tablet    Sig: TAKE 1 TABLET EVERY DAY FOR CHOLESTEROL    Dispense:  90 tablet    Refill:   3   olmesartan-hydrochlorothiazide (BENICAR HCT) 40-25 MG tablet    Sig: Take 1 tablet by mouth daily.    Dispense:  90 tablet    Refill:  1     Follow-up: Return in about 3 months (around 04/19/2023).  Mechele Claude, M.D.

## 2023-01-18 LAB — CBC WITH DIFFERENTIAL/PLATELET
Basophils Absolute: 0.1 10*3/uL (ref 0.0–0.2)
Basos: 1 %
EOS (ABSOLUTE): 0.2 10*3/uL (ref 0.0–0.4)
Eos: 2 %
Hematocrit: 43.8 % (ref 34.0–46.6)
Hemoglobin: 14.3 g/dL (ref 11.1–15.9)
Immature Grans (Abs): 0 10*3/uL (ref 0.0–0.1)
Immature Granulocytes: 0 %
Lymphocytes Absolute: 1.6 10*3/uL (ref 0.7–3.1)
Lymphs: 22 %
MCH: 27.1 pg (ref 26.6–33.0)
MCHC: 32.6 g/dL (ref 31.5–35.7)
MCV: 83 fL (ref 79–97)
Monocytes Absolute: 0.5 10*3/uL (ref 0.1–0.9)
Monocytes: 6 %
Neutrophils Absolute: 4.9 10*3/uL (ref 1.4–7.0)
Neutrophils: 69 %
Platelets: 224 10*3/uL (ref 150–450)
RBC: 5.28 x10E6/uL (ref 3.77–5.28)
RDW: 13.6 % (ref 11.7–15.4)
WBC: 7.2 10*3/uL (ref 3.4–10.8)

## 2023-01-18 LAB — LIPID PANEL
Chol/HDL Ratio: 3.4 ratio (ref 0.0–4.4)
Cholesterol, Total: 166 mg/dL (ref 100–199)
HDL: 49 mg/dL (ref >39)
LDL Chol Calc (NIH): 85 mg/dL (ref 0–99)
Triglycerides: 188 mg/dL — ABNORMAL HIGH (ref 0–149)
VLDL Cholesterol Cal: 32 mg/dL (ref 5–40)

## 2023-01-18 LAB — CMP14+EGFR
ALT: 22 IU/L (ref 0–32)
AST: 26 IU/L (ref 0–40)
Albumin/Globulin Ratio: 2 (ref 1.2–2.2)
Albumin: 4.7 g/dL (ref 3.9–4.9)
Alkaline Phosphatase: 78 IU/L (ref 44–121)
BUN/Creatinine Ratio: 12 (ref 12–28)
BUN: 8 mg/dL (ref 8–27)
Bilirubin Total: 0.7 mg/dL (ref 0.0–1.2)
CO2: 24 mmol/L (ref 20–29)
Calcium: 9.5 mg/dL (ref 8.7–10.3)
Chloride: 98 mmol/L (ref 96–106)
Creatinine, Ser: 0.66 mg/dL (ref 0.57–1.00)
Globulin, Total: 2.3 g/dL (ref 1.5–4.5)
Glucose: 82 mg/dL (ref 70–99)
Potassium: 4.6 mmol/L (ref 3.5–5.2)
Sodium: 137 mmol/L (ref 134–144)
Total Protein: 7 g/dL (ref 6.0–8.5)
eGFR: 95 mL/min/{1.73_m2} (ref >59)

## 2023-01-18 LAB — TSH+FREE T4
Free T4: 1.71 ng/dL (ref 0.82–1.77)
TSH: 0.751 u[IU]/mL (ref 0.450–4.500)

## 2023-01-19 ENCOUNTER — Encounter: Payer: Self-pay | Admitting: Family Medicine

## 2023-01-20 NOTE — Progress Notes (Signed)
Hello Cali,  Your lab result is normal and/or stable.Some minor variations that are not significant are commonly marked abnormal, but do not represent any medical problem for you.  Best regards, Tony Friscia, M.D.

## 2023-01-22 ENCOUNTER — Ambulatory Visit: Payer: Medicare HMO | Attending: Hematology and Oncology

## 2023-01-22 DIAGNOSIS — I890 Lymphedema, not elsewhere classified: Secondary | ICD-10-CM | POA: Diagnosis not present

## 2023-01-22 DIAGNOSIS — C50412 Malignant neoplasm of upper-outer quadrant of left female breast: Secondary | ICD-10-CM | POA: Insufficient documentation

## 2023-01-22 DIAGNOSIS — Z170 Estrogen receptor positive status [ER+]: Secondary | ICD-10-CM | POA: Insufficient documentation

## 2023-01-22 DIAGNOSIS — R293 Abnormal posture: Secondary | ICD-10-CM | POA: Diagnosis not present

## 2023-01-22 DIAGNOSIS — Z483 Aftercare following surgery for neoplasm: Secondary | ICD-10-CM | POA: Diagnosis not present

## 2023-01-22 NOTE — Therapy (Signed)
OUTPATIENT PHYSICAL THERAPY UE ONCOLOGY TREATMENT   Patient Name: Morgan Schmidt MRN: [id number] DOB:1951/04/26, 72 y.o., female Today's Date: 01/22/2023   PT End of Session - 01/22/23 1141     Visit Number 5    Number of Visits 5    Date for PT Re-Evaluation 01/22/23    PT Start Time 1107    PT Stop Time 1204    PT Time Calculation (min) 57 min    Activity Tolerance Patient tolerated treatment well    Behavior During Therapy [facility] for tasks assessed/performed              Past Medical History:  Diagnosis Date   AIN (anal intraepithelial neoplasia) anal canal    Cancer (HCC) 03/15/2022   breast   History of adenomatous polyp of colon    History of radiation therapy    Left breast- 05/01/22-05/22/22- Dr. Antony Blackbird   History of thyroid cancer 06/2008   s/p  total bilateral thyroidectomy for symptomatic multinodular goiter;   06-30-2008 s/p total thyroidectomy -- papillary thyroid cancer  and treated w/ RAI post surgery 09-29-2008   (last scan negative for recurrence in epic 03/ 2011)   Hyperlipidemia    Hypertension    Hypothyroidism, postsurgical 06/2008   and had RAI post surgery 02/ 2010;   followed by pcp   OA (osteoarthritis)    Type 2 diabetes mellitus treated with insulin (HCC)    followed by pcp   (11-23-2021  per pt checks daily in am and at times in afternoon,  fasting sugar--- 95--110)   Wears dentures    upper   Past Surgical History:  Procedure Laterality Date   ANAL INTRAEPITHELIAL NEOPLASIA EXCISION N/A 11/30/2021   Procedure: EXCISIONAL BIOPSY ANAL LESIONS;  Surgeon: Romie Levee, MD;  Location: [facility];  Service: General;  Laterality: N/A;   BREAST LUMPECTOMY WITH RADIOACTIVE SEED AND SENTINEL LYMPH NODE BIOPSY Left 03/22/2022   Procedure: LEFT BREAST LUMPECTOMY WITH RADIOACTIVE SEED AND SENTINEL LYMPH NODE BIOPSY;  Surgeon: Griselda Miner, MD;  Location:  SUR[facility]ERY CENTER;  Service: General;  Laterality: Left;  GEN & PEC  BLOCK   CESAREAN SECTION  1985   W/  BILATERAL TUBAL LIGATION   COLONOSCOPY WITH PROPOFOL  11/10/2021   by Dr.Pyrtle   HIGH RESOLUTION ANOSCOPY N/A 11/30/2021   Procedure: HIGH RESOLUTION ANOSCOPY;  Surgeon: Romie Levee, MD;  Location: [facility];  Service: General;  Laterality: N/A;   HIP PINNING,CANNULATED Left 03/10/2018   Procedure: CANNULATED HIP PINNING;  Surgeon: Durene Romans, MD;  Location: [facility] OR;  Service: Orthopedics;  Laterality: Left;   KNEE ARTHROSCOPY Left 10/22/2016   Procedure: ARTHROSCOPY LEFT KNEE WITH DEBRIDEMENT;  Surgeon: Durene Romans, MD;  Location: [facility];  Service: Orthopedics;  Laterality: Left;   KNEE ARTHROSCOPY WITH MEDIAL MENISECTOMY  10/22/2016   Procedure: KNEE ARTHROSCOPY WITH MEDIAL MENISECTOMY;  Surgeon: Durene Romans, MD;  Location: [facility];  Service: Orthopedics;;   KNEE ARTHROSCOPY WITH SUBCHONDROPLASTY Left 10/22/2016   Procedure: KNEE ARTHROSCOPY WITH MEDIAL CHONDROPLASTY;  Surgeon: Durene Romans, MD;  Location: [facility];  Service: Orthopedics;  Laterality: Left;   TOTAL THYROIDECTOMY Bilateral 06/30/2008   @[facility]  by dr Johna Sheriff   Patient Active Problem List   Diagnosis Date Noted   Genetic testing 03/20/2022   Malignant neoplasm of upper-outer quadrant of left breast in female, estrogen receptor positive (HCC) 02/26/2022   Pain in left knee 07/01/2018  Controlled diabetes mellitus type 2 with complications (HCC) 03/19/2018   Essential hypertension 03/23/2016   Other specified hypothyroidism 12/16/2014   Mixed diabetic hyperlipidemia associated with type 2 diabetes mellitus (HCC) 12/16/2014   Long term current use of insulin (HCC) 12/16/2014   Polyposis of colon 12/16/2014    PCP: Mechele Claude, MD  REFERRING PROVIDER: Serena Croissant, MD  REFERRING DIAG: [phone] (ICD-10-CM) - Malignant neoplasm of upper-outer quadrant of left breast in female, estrogen receptor  positive (HCC)   THERAPY DIAG:  Lymphedema, not elsewhere classified  Aftercare following surgery for neoplasm  Abnormal posture  Malignant neoplasm of upper-outer quadrant of left breast in female, estrogen receptor positive (HCC)  Rationale for Evaluation and Treatment Rehabilitation  ONSET DATE: 02/21/22  SUBJECTIVE                                                                                                                                                                                           SUBJECTIVE STATEMENT: I've been self bandaging since last week and I think it's gone okay. I put them on very quickly this morning so they aren't on good right now.   PERTINENT HISTORY:  Patient was diagnosed on 02/21/22 with left grade 2. It measures 0.7 cm and is located in the upper-outer quadrant. It is ER+, PR+, HER2- with a Ki67 of 3%. Hx of rectal cancer and surgery in 12/2021. 03/22/22- L breast lumpectomy and SLNB 0/2, completed radiation 05/22/22  PATIENT GOALS  to decrease swelling   PAIN:  Are you having pain? No   PRECAUTIONS: at risk for lymphedema on L   HAND DOMINANCE: right  WEIGHT BEARING RESTRICTIONS No  FALLS:  Has patient fallen in last 6 months? 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   PRIOR LEVEL OF FUNCTION: Independent   OBJECTIVE  COGNITION:  Overall cognitive status: Within functional limits for tasks assessed    POSTURE:  Forward head and rounded shoulders posture  OBSERVATION: L breast visibly swollen with enlarged pores  PALPATION: increased fibrosis in inferior/medial breast  UPPER EXTREMITY AROM/PROM:  A/PROM RIGHT    03/07/22   Shoulder extension 75  Shoulder flexion 156  Shoulder abduction 160  Shoulder  internal rotation 53  Shoulder external rotation 78    (Blank rows = not tested)  A/PROM LEFT   03/07/22 LEFT  12/25/22  Shoulder extension 72 68  Shoulder flexion 155 155  Shoulder abduction 179 175  Shoulder internal rotation 53 45  Shoulder external rotation 85 81    (Blank rows = not tested)   CERVICAL AROM: All within normal limits:    Percent limited  Flexion WFL  Extension WFL  Right lateral flexion WFL  Left lateral flexion WFL  Right rotation WFL  Left rotation WFL     UPPER EXTREMITY STRENGTH: 5/5   LYMPHEDEMA ASSESSMENTS:   LANDMARK RIGHT   03/07/22 RIGHT  12/25/22  10 cm proximal to olecranon process 29 29.2  Olecranon process 25 24  10  cm proximal to ulnar styloid process 23 22.5  Just proximal to ulnar styloid process 16.4 16.2  Across hand at thumb web space 20.5 20  At base of 2nd digit 6.6 6.6  (Blank rows = not tested)  LANDMARK LEFT   03/07/22 LEFT 12/25/22 LEFT 01/22/23  10 cm proximal to olecranon process 28.5 27.5 28.6  Olecranon process 24.5 24 24  10  cm proximal to ulnar styloid process 23 21.8 22.6  Just proximal to ulnar styloid process 16 15.5 15.2  Across hand at thumb web space 19 19.5 18.4  At base of 2nd digit 6.5 6.6 6.2  (Blank rows = not tested)  BREAST COMPLAINTS SCALE: BREAST COMPLAINTS QUESTIONNAIRE Pain: 0 Heaviness: 1 Swollen feeling: 1 Tense Skin: 1 Redness: 1 Bra Print: 0 Size of Pores: 0 Hard feeling: 1 Total:  5   /80 A Score over 9 indicates lymphedema issues in the breast  TODAY'S TREATMENT: 01/22/23: Manual Therapy Pt came in with bandages on. The 8 and 10 cm short stretch bandages were on correctly, just now high enough on her arm. Her hand and finger bandages were not on correctly at all so spent time redoing this for pt with cuing and she recorded this with her phone as well to have for reference at home.  MLD to Lt UE in supine as follows briefly at end of session as time allowed after bandaging  instruction/review: Short neck, 5 diaphragmatic breaths, Lt inguinal and Rt axillary nodes, Lt axillo-inguinal and anterior inter-axillary anastomosis, then Lt UE working from proximal to distal and retracing all steps back to anastomosis and ending with lymph nodes.  Compression Bandaging to Lt UE as follows: Cocoa butter (did th=is only after Mld for final bandage application), thin stockinette, Elastomull to fingers 1-4, Artiflex x 1 from hand to axilla, 1-6 cm to hand, 1-8 cm from wrist for "X" at elbow, and 1-10 cm short stretch compression bandage from wrist to axilla. Did this 2x at beginning of session and then again after MLD.   01/15/23: Redid SOZO today and pt was still elevated in the yellow so added compression bandaging to help improve lymphatic drainage of Lt UE. Manual Therapy Compression Bandaging to Lt UE as follows: Cocoa butter, thin stockinette, Elastomull to fingers 1-4, Artiflex x 1 from hand to axilla, 1-6 cm to hand, 1-8 cm from wrist for "X" at elbow, and 1-10 cm short stretch compression bandage from wrist to axilla. Educated pt while performing this today and had her return demo of each bandage after therapist demo.  Handout also issued for this  01/08/23: Manual Therapy MLD to Lt breast and UE in supine as follows: Short neck, 5 diaphragmatic breaths, Lt inguinal and Rt axillary nodes, Lt axillo-inguinal and anterior inter-axillary anastomosis, then Lt breast and Lt UE working from proximal to distal then briefly into Rt S/L to instruct pt how to better reach her lateral anastomosis as  she struggled with reaching in supine across her body and then finished in supine retracing all steps and reviewing with pt while performing having her return demo, and then after performing sequence verbally reviewed with hand placement sequence again for clarity for pt.   01/01/23: Manual Therapy MLD to Lt breast and UE in supine as follows: Short neck, 5 diaphragmatic breaths, Lt inguinal  and Rt axillary nodes, Lt axillo-inguinal and anterior inter-axillary anastomosis, then Lt breast and Lt UE working from proximal to distal then briefly into Rt S/L to instruct pt how to better reach her lateral anastomosis as she struggled with reaching in supine across her body and then finished in supine retracing all steps instructing pt throughout and having her return demo.    PATIENT EDUCATION:  Education details: Compression bandaging Person educated: Patient Education method: Explanation, Demonstration, Handout Education comprehension: Patient verbalized understanding and returned demonstration; will benefit from further review   HOME EXERCISE PROGRAM: Patient was instructed today in a home exercise program today for post op shoulder range of motion. These included active assist shoulder flexion in sitting, scapular retraction, wall walking with shoulder abduction, and hands behind head external rotation.  She was encouraged to do these twice a day, holding 3 seconds and repeating 5 times when permitted by her physician.; 01/01/23 - self MLD; 01/15/23 - compression bandaging   ASSESSMENT:  CLINICAL IMPRESSION: Pt came in with bandages on from this morning. She reports she felt she applied them better over weekend. Todays hand and finger bandages were not done correctly so spent time reviewing this with her and she recorded this with her phone for further reference at home as well. Also remeasured her circumference.   PT treatment/interventions: ADL/self-care home management, pt/family education, therapeutic exercise, manual therapy, compression bandaging, manual lymphatic drainage, orthotic fit   OBJECTIVE IMPAIRMENTS: decreased knowledge of condition, decreased knowledge of use of DME, increased edema, increased fascial restrictions, and postural dysfunction.   ACTIVITY LIMITATIONS: none  PARTICIPATION LIMITATIONS: none  PERSONAL FACTORS: none are also affecting patient's  functional outcome.   REHAB POTENTIAL: Good  CLINICAL DECISION MAKING: Stable/uncomplicated  EVALUATION COMPLEXITY: Low  GOALS: Goals reviewed with patient? Yes  SHORT TERM GOALS=LONG TERM GOALS Target date: 01/22/23  Pt will be independent in self MLD for long term management of L breast and LUE lymphedema.  Baseline: Goal status: INITIAL  2.  Pt will be able to manage her lymphedema through self MLD and compression garments.  Baseline:  Goal status: INITIAL  3.  Pt will return to green on ldex screening to indicate reversal of LUE lymphedema.  Baseline:  Goal status: INITIAL  4.  Pt will report a 50% improvement in fibrosis of L breast to decrease risk of infection.  Baseline:  Goal status: INITIAL  PLAN: PT FREQUENCY/DURATION: 1x/wk for 4 wks  PLAN FOR NEXT SESSION: How is self bandaging going? Re do SOZO next to assess if bandaging is helping; redo SOZO next as she will be >28 days since last assessed     Hermenia Bers, PTA 01/22/2023, 12:59 PM

## 2023-02-16 NOTE — Progress Notes (Signed)
Patient Care Team: Mechele Claude, MD as PCP - General (Family Medicine) Griselda Miner, MD as Consulting Physician (General Surgery) Serena Croissant, MD as Consulting Physician (Hematology and Oncology) Antony Blackbird, MD as Consulting Physician (Radiation Oncology) Serena Croissant, MD as Consulting Physician (Hematology and Oncology)  DIAGNOSIS: No diagnosis found.  SUMMARY OF ONCOLOGIC HISTORY: Oncology History  Malignant neoplasm of upper-outer quadrant of left breast in female, estrogen receptor positive (HCC)  02/21/2022 Initial Diagnosis   Screening mammogram detected left breast asymmetry mass detected on ultrasound at 3 o'clock position measuring 0.7 cm, axilla negative, biopsy revealed grade 2 IDC with DCIS ER 95%, PR 90%, HER2 equivocal by IHC, FISH negative, Ki-67 3%   02/28/2022 Cancer Staging   Staging form: Breast, AJCC 8th Edition - Clinical: Stage IA (cT1b, cN0, cM0, G2, ER+, PR+, HER2-) - Signed by Serena Croissant, MD on 02/28/2022 Stage prefix: Initial diagnosis Histologic grading system: 3 grade system    Genetic Testing   [facility] CustomNext Panel was Negative. Report date is 03/19/2022.  The CustomNext-Cancer+RNAinsight panel offered by [employer] includes sequencing and rearrangement analysis for the following 49 genes:  APC, ATM, AXIN2, BARD1, BMPR1A, BRCA1, BRCA2, BRIP1, CDH1, CDK4, CDKN2A, CHEK2, CTNNA1, DICER1, EPCAM, GREM1, HOXB13, KIT, MEN1, MLH1, MSH2, MSH3, MSH6, MUTYH, NBN, NF1, NTHL1, PALB2, PDGFRA, PMS2, POLD1, POLE, PRKAR1A, PTEN, RAD50, RAD51C, RAD51D, RET, SDHA, SDHB, SDHC, SDHD, SMAD4, SMARCA4, STK11, TP53, TSC1, TSC2, and VHL.  RNA data is routinely analyzed for use in variant interpretation for all genes.   03/22/2022 Surgery   Left lumpectomy: 9 mm grade 2 IDC with DCIS with clear margins 0/2 lymph nodes negative ER 95%, PR 90%, HER2 2+ by IHC negative by FISH, Ki-67 3%   03/22/2022 Oncotype testing   21/7%   05/02/2022 - 05/22/2022 Radiation Therapy    Site Technique Total Dose (Gy) Dose per Fx (Gy) Completed Fx Beam Energies  Breast, Left: Breast_L 3D 42.72/42.72 2.67 16/16 10X     05/2022 -  Anti-estrogen oral therapy   Anastrozole     CHIEF COMPLIANT: Follow-up on letrozole   INTERVAL HISTORY: Morgan Schmidt is a 72 y.o. female is here because of recent diagnosis of left breast mass. She presents to the clinic today for a follow-up.    ALLERGIES:  is allergic to lipitor [atorvastatin calcium] and ace inhibitors.  MEDICATIONS:  Current Outpatient Medications  Medication Sig Dispense Refill   Accu-Chek FastClix Lancets MISC Use to check Blood Sugars twice daily. Dx E11.8 204 each 3   acetaminophen (TYLENOL) 325 MG tablet Take 325-650 mg by mouth every 6 (six) hours as needed (for pain or headaches).     Alcohol Swabs (B-D SINGLE USE SWABS REGULAR) PADS Use to check Blood Sugars twice daily. Dx E11.8 200 each 3   alendronate (FOSAMAX) 70 MG tablet Take 1 tablet (70 mg total) by mouth every 7 (seven) days. Take with a full glass of water on an empty stomach. Do not lay down for at least 2 hours 13 tablet 3   anastrozole (ARIMIDEX) 1 MG tablet Take 1 tablet (1 mg total) by mouth daily. 90 tablet 3   Blood Glucose Calibration (ACCU-CHEK AVIVA) SOLN Use to check Blood Sugars twice daily. Dx E11.8 1 each 3   Blood Glucose Monitoring Suppl (ACCU-CHEK AVIVA PLUS) w/Device KIT Use to check Blood Sugars twice daily. Dx E11.8 1 kit 0   Cholecalciferol (VITAMIN D) 2000 UNITS tablet Take 1 tablet (2,000 Units total) by mouth daily. (Patient taking  differently: Take 2,000 Units by mouth every evening.) 30 tablet 11   diclofenac sodium (VOLTAREN) 1 % GEL Apply 2-4 g topically See admin instructions. Apply 2-4 grams to each hand up to 4 times a day as needed for arthritis in the fingers (Patient taking differently: Apply 2-4 g topically See admin instructions. Apply 2-4 grams to each hand up to 4 times a day as needed for arthritis in the fingers)  100 g 11   docusate sodium (COLACE) 100 MG capsule Take 1 capsule (100 mg total) by mouth 2 (two) times daily. 180 capsule 3   Dulaglutide (TRULICITY) 4.5 MG/0.5ML SOPN Inject 4.5 mg as directed once a week. Monday's 6.5 mL 3   glucose blood (ACCU-CHEK AVIVA PLUS) test strip TEST BLOOD SUGAR TWICE DAILY Dx E11.8 200 strip 3   insulin glargine (LANTUS SOLOSTAR) 100 UNIT/ML Solostar Pen Inject 110 Units into the skin daily after breakfast. 105 mL 3   Insulin Pen Needle (DROPLET PEN NEEDLES) 31G X 8 MM MISC USE AS DIRECTED EVERY DAY Dx E11.8 100 each 3   levothyroxine (SYNTHROID) 112 MCG tablet Take 1 tablet (112 mcg total) by mouth daily. DOSE CHANGE 90 tablet 3   metFORMIN (GLUCOPHAGE) 1000 MG tablet Take 1 tablet (1,000 mg total) by mouth 2 (two) times daily with a meal. 180 tablet 3   olmesartan-hydrochlorothiazide (BENICAR HCT) 40-25 MG tablet Take 1 tablet by mouth daily. 90 tablet 1   rosuvastatin (CRESTOR) 20 MG tablet TAKE 1 TABLET EVERY DAY FOR CHOLESTEROL 90 tablet 3   No current facility-administered medications for this visit.    PHYSICAL EXAMINATION: ECOG PERFORMANCE STATUS: {CHL ONC ECOG PS:[phone]}  There were no vitals filed for this visit. There were no vitals filed for this visit.  BREAST:*** No palpable masses or nodules in either right or left breasts. No palpable axillary supraclavicular or infraclavicular adenopathy no breast tenderness or nipple discharge. (exam performed in the presence of a chaperone)  LABORATORY DATA:  I have reviewed the data as listed    Latest Ref Rng & Units 01/17/2023   11:48 AM 10/11/2022   10:37 AM 07/11/2022   11:00 AM  CMP  Glucose 70 - 99 mg/dL 82  91  88   BUN 8 - 27 mg/dL 8  9  9    Creatinine 0.57 - 1.00 mg/dL 4.09  8.11  9.14   Sodium 134 - 144 mmol/L 137  142  140   Potassium 3.5 - 5.2 mmol/L 4.6  4.3  4.4   Chloride 96 - 106 mmol/L 98  104  101   CO2 20 - 29 mmol/L 24  22  25    Calcium 8.7 - 10.3 mg/dL 9.5  7.9  8.5    Total Protein 6.0 - 8.5 g/dL 7.0  6.4  6.6   Total Bilirubin 0.0 - 1.2 mg/dL 0.7  0.7  0.8   Alkaline Phos 44 - 121 IU/L 78  65  80   AST 0 - 40 IU/L 26  17  31    ALT 0 - 32 IU/L 22  16  26      Lab Results  Component Value Date   WBC 7.2 01/17/2023   HGB 14.3 01/17/2023   HCT 43.8 01/17/2023   MCV 83 01/17/2023   PLT 224 01/17/2023   NEUTROABS 4.9 01/17/2023    ASSESSMENT & PLAN:  No problem-specific Assessment & Plan notes found for this encounter.    No orders of the defined types were placed in  this encounter.  The patient has a good understanding of the overall plan. she agrees with it. she will call with any problems that may develop before the next visit here. Total time spent: 30 mins including face to face time and time spent for planning, charting and co-ordination of care   Sherlyn Lick, CMA 02/16/23    I Janan Ridge am acting as a Neurosurgeon for [employer]  ***

## 2023-02-18 ENCOUNTER — Ambulatory Visit
Admission: RE | Admit: 2023-02-18 | Discharge: 2023-02-18 | Disposition: A | Discharge status: Home / Self Care | Payer: Medicare HMO | Source: Ambulatory Visit | Attending: Adult Health | Admitting: Adult Health

## 2023-02-18 DIAGNOSIS — C50412 Malignant neoplasm of upper-outer quadrant of left female breast: Secondary | ICD-10-CM

## 2023-02-18 DIAGNOSIS — Z853 Personal history of malignant neoplasm of breast: Secondary | ICD-10-CM | POA: Diagnosis not present

## 2023-02-18 HISTORY — DX: Personal history of irradiation: Z92.3

## 2023-02-20 ENCOUNTER — Other Ambulatory Visit: Payer: Self-pay

## 2023-02-20 ENCOUNTER — Inpatient Hospital Stay: Payer: Medicare HMO | Attending: Hematology and Oncology | Admitting: Hematology and Oncology

## 2023-02-20 VITALS — BP 117/61 | HR 118 | Temp 97.7°F | Resp 18 | Ht 59.0 in | Wt 170.6 lb

## 2023-02-20 DIAGNOSIS — Z79811 Long term (current) use of aromatase inhibitors: Secondary | ICD-10-CM | POA: Insufficient documentation

## 2023-02-20 DIAGNOSIS — Z923 Personal history of irradiation: Secondary | ICD-10-CM | POA: Insufficient documentation

## 2023-02-20 DIAGNOSIS — E119 Type 2 diabetes mellitus without complications: Secondary | ICD-10-CM | POA: Insufficient documentation

## 2023-02-20 DIAGNOSIS — C50412 Malignant neoplasm of upper-outer quadrant of left female breast: Secondary | ICD-10-CM | POA: Diagnosis not present

## 2023-02-20 DIAGNOSIS — Z170 Estrogen receptor positive status [ER+]: Secondary | ICD-10-CM | POA: Insufficient documentation

## 2023-02-20 DIAGNOSIS — M810 Age-related osteoporosis without current pathological fracture: Secondary | ICD-10-CM | POA: Insufficient documentation

## 2023-02-20 DIAGNOSIS — Z87891 Personal history of nicotine dependence: Secondary | ICD-10-CM | POA: Diagnosis not present

## 2023-02-20 NOTE — Assessment & Plan Note (Addendum)
03/22/2022:Left lumpectomy: 9 mm grade 2 IDC with DCIS with clear margins 0/2 lymph nodes negative ER 95%, PR 90%, HER2 2+ by IHC negative by FISH, Ki-67 3% Oncotype DX recurrence score: 21 (risk of distant recurrence: 7%)   Treatment plan: 1.  adjuvant radiation 05/02/2022-05/22/2022 3.  Adjuvant antiestrogen therapy with letrozole started November 2023 discontinued December 2023 because of joint pains switched to anastrozole 10/04/2022 (due to shoulder and hip pain)   Anastrozole toxicities: Still has some joint stiffness and achiness but overall she thinks she is able to tolerate anastrozole fairly well.   Breast cancer surveillance: Breast exam 10/04/2022: Benign Mammogram 02/18/2023: Benign breast density category B   Return to clinic in 1 year for follow-up

## 2023-02-23 ENCOUNTER — Other Ambulatory Visit: Payer: Self-pay | Admitting: Family Medicine

## 2023-02-23 DIAGNOSIS — E038 Other specified hypothyroidism: Secondary | ICD-10-CM

## 2023-02-25 ENCOUNTER — Ambulatory Visit: Payer: Medicare HMO | Attending: Hematology and Oncology

## 2023-02-25 VITALS — Wt 170.4 lb

## 2023-02-25 DIAGNOSIS — Z483 Aftercare following surgery for neoplasm: Secondary | ICD-10-CM | POA: Insufficient documentation

## 2023-02-25 NOTE — Therapy (Signed)
OUTPATIENT PHYSICAL THERAPY SOZO SCREENING NOTE   Patient Name: Morgan Schmidt MRN: [id number] DOB:24-Mar-1951, 72 y.o., female Today's Date: 02/25/2023  PCP: Mechele Claude, MD REFERRING PROVIDER: Serena Croissant, MD   PT End of Session - 02/25/23 1000     Visit Number 5   # unchanged due to screen only   PT Start Time 0959    PT Stop Time 1009    PT Time Calculation (min) 10 min    Activity Tolerance Patient tolerated treatment well    Behavior During Therapy [facility] for tasks assessed/performed             Past Medical History:  Diagnosis Date   AIN (anal intraepithelial neoplasia) anal canal    Cancer (HCC) 03/15/2022   breast   History of adenomatous polyp of colon    History of radiation therapy    Left breast- 05/01/22-05/22/22- Dr. Antony Blackbird   History of thyroid cancer 06/2008   s/p  total bilateral thyroidectomy for symptomatic multinodular goiter;   06-30-2008 s/p total thyroidectomy -- papillary thyroid cancer  and treated w/ RAI post surgery 09-29-2008   (last scan negative for recurrence in epic 03/ 2011)   Hyperlipidemia    Hypertension    Hypothyroidism, postsurgical 06/2008   and had RAI post surgery 02/ 2010;   followed by pcp   OA (osteoarthritis)    Personal history of radiation therapy    Type 2 diabetes mellitus treated with insulin (HCC)    followed by pcp   (11-23-2021  per pt checks daily in am and at times in afternoon,  fasting sugar--- 95--110)   Wears dentures    upper   Past Surgical History:  Procedure Laterality Date   ANAL INTRAEPITHELIAL NEOPLASIA EXCISION N/A 11/30/2021   Procedure: EXCISIONAL BIOPSY ANAL LESIONS;  Surgeon: Romie Levee, MD;  Location: [facility] [place];  Service: General;  Laterality: N/A;   BREAST LUMPECTOMY WITH RADIOACTIVE SEED AND SENTINEL LYMPH NODE BIOPSY Left 03/22/2022   Procedure: LEFT BREAST LUMPECTOMY WITH RADIOACTIVE SEED AND SENTINEL LYMPH NODE BIOPSY;  Surgeon: Griselda Miner, MD;  Location:  [facility];  Service: General;  Laterality: Left;  GEN & PEC BLOCK   CESAREAN SECTION  1985   W/  BILATERAL TUBAL LIGATION   COLONOSCOPY WITH PROPOFOL  11/10/2021   by Dr.Pyrtle   HIGH RESOLUTION ANOSCOPY N/A 11/30/2021   Procedure: HIGH RESOLUTION ANOSCOPY;  Surgeon: Romie Levee, MD;  Location: [facility];  Service: General;  Laterality: N/A;   HIP PINNING,CANNULATED Left 03/10/2018   Procedure: CANNULATED HIP PINNING;  Surgeon: Durene Romans, MD;  Location: [facility] OR;  Service: Orthopedics;  Laterality: Left;   KNEE ARTHROSCOPY Left 10/22/2016   Procedure: ARTHROSCOPY LEFT KNEE WITH DEBRIDEMENT;  Surgeon: Durene Romans, MD;  Location: [facility];  Service: Orthopedics;  Laterality: Left;   KNEE ARTHROSCOPY WITH MEDIAL MENISECTOMY  10/22/2016   Procedure: KNEE ARTHROSCOPY WITH MEDIAL MENISECTOMY;  Surgeon: Durene Romans, MD;  Location: [facility];  Service: Orthopedics;;   KNEE ARTHROSCOPY WITH SUBCHONDROPLASTY Left 10/22/2016   Procedure: KNEE ARTHROSCOPY WITH MEDIAL CHONDROPLASTY;  Surgeon: Durene Romans, MD;  Location: [facility];  Service: Orthopedics;  Laterality: Left;   TOTAL THYROIDECTOMY Bilateral 06/30/2008   @[facility]  by dr Johna Sheriff   Patient Active Problem List   Diagnosis Date Noted   Genetic testing 03/20/2022   Malignant neoplasm of upper-outer quadrant of left breast in female, estrogen receptor positive (HCC) 02/26/2022  Pain in left knee 07/01/2018   Controlled diabetes mellitus type 2 with complications (HCC) 03/19/2018   Essential hypertension 03/23/2016   Other specified hypothyroidism 12/16/2014   Mixed diabetic hyperlipidemia associated with type 2 diabetes mellitus (HCC) 12/16/2014   Long term current use of insulin (HCC) 12/16/2014   Polyposis of colon 12/16/2014    REFERRING DIAG: left breast cancer at risk for lymphedema  THERAPY DIAG: Aftercare following surgery for neoplasm  PERTINENT  HISTORY: Patient was diagnosed on 02/21/22 with left grade 2. It measures 0.7 cm and is located in the upper-outer quadrant. It is ER+, PR+, HER2- with a Ki67 of 3%. Hx of rectal cancer and surgery in 12/2021. 03/22/22- L breast lumpectomy and SLNB 0/2, completed radiation 05/22/22   PRECAUTIONS: left UE Lymphedema risk, None  SUBJECTIVE: Pt returns for her 1 month follow up with elevated change from baseline. "I've been wearing my compression sleeve most of the time and bandaging at night ~2/wk."  PAIN:  Are you having pain? No  SOZO SCREENING:  Patient was assessed today using the SOZO machine to determine the lymphedema index score. This was compared to her baseline score. It was determined that she is NOT within the recommended range when compared to her baseline. It seems pt now has chronic subclinical lymphedema.   Discussed with pt that at this point she seems to have chronic subclinical lymphedema. Educated her that she should cont to wear her compression sleeve daily. Also discussed that she should reach out to her insurance company to see where she can be measured for new compression garments when that time arrives. Also encouraged her to try bandaging more frequently at night. Pt agreed and we will go back to monitoring pt every 3 months and she knows to call us if her lymphedema is progressing past what she can manage at home.    L-DEX FLOWSHEETS - 02/25/23 1000       L-DEX LYMPHEDEMA SCREENING   Measurement Type Unilateral    L-DEX MEASUREMENT EXTREMITY Upper Extremity    POSITION  Standing    DOMINANT SIDE Right    At Risk Side Left    BASELINE SCORE (UNILATERAL) 0    L-DEX SCORE (UNILATERAL) 8.2    VALUE CHANGE (UNILAT) 8.2            P: Cont 3 mont monitoring to make sure pt doesn't return to red and is managing her subclinical lymphedema at home.   Hermenia Bers, PTA 02/25/2023, 10:53 AM

## 2023-04-06 ENCOUNTER — Other Ambulatory Visit: Payer: Self-pay | Admitting: Family Medicine

## 2023-04-24 DIAGNOSIS — D013 Carcinoma in situ of anus and anal canal: Secondary | ICD-10-CM | POA: Diagnosis not present

## 2023-04-25 ENCOUNTER — Ambulatory Visit (INDEPENDENT_AMBULATORY_CARE_PROVIDER_SITE_OTHER): Payer: Medicare HMO | Admitting: Family Medicine

## 2023-04-25 VITALS — BP 114/60 | HR 80 | Temp 97.6°F | Ht 59.0 in | Wt 169.4 lb

## 2023-04-25 DIAGNOSIS — E1169 Type 2 diabetes mellitus with other specified complication: Secondary | ICD-10-CM

## 2023-04-25 DIAGNOSIS — E782 Mixed hyperlipidemia: Secondary | ICD-10-CM

## 2023-04-25 DIAGNOSIS — I10 Essential (primary) hypertension: Secondary | ICD-10-CM

## 2023-04-25 DIAGNOSIS — Z794 Long term (current) use of insulin: Secondary | ICD-10-CM

## 2023-04-25 DIAGNOSIS — E039 Hypothyroidism, unspecified: Secondary | ICD-10-CM | POA: Diagnosis not present

## 2023-04-25 DIAGNOSIS — E118 Type 2 diabetes mellitus with unspecified complications: Secondary | ICD-10-CM | POA: Diagnosis not present

## 2023-04-25 DIAGNOSIS — H5213 Myopia, bilateral: Secondary | ICD-10-CM | POA: Diagnosis not present

## 2023-04-25 LAB — BAYER DCA HB A1C WAIVED: HB A1C (BAYER DCA - WAIVED): 7.1 % — ABNORMAL HIGH (ref 4.8–5.6)

## 2023-04-25 MED ORDER — METFORMIN HCL 1000 MG PO TABS: 1000.0000 mg | ORAL_TABLET | Freq: Two times a day (BID) | ORAL | 3 refills | Status: DC

## 2023-04-25 MED ORDER — TRULICITY 4.5 MG/0.5ML ~~LOC~~ SOAJ: 4.5000 mg | SUBCUTANEOUS | 3 refills | Status: DC

## 2023-04-25 MED ORDER — ACCU-CHEK FASTCLIX LANCETS MISC: 3 refills | Status: AC

## 2023-04-25 NOTE — Progress Notes (Signed)
Subjective:  [name],  female    DOB: 02-Oct-1950  Age: 72 y.o.    CC: Medical Management of Chronic Issues   HPI Morgan Schmidt presents for  follow-up of hypertension. Patient has no history of headache chest pain or shortness of breath or recent cough. Patient also denies symptoms of TIA such as numbness weakness lateralizing. Patient denies side effects from medication. States taking it regularly.  Patient also  in for follow-up of elevated cholesterol. Doing well without complaints on current medication. Denies side effects  including myalgia and arthralgia and nausea. Also in today for liver function testing. Currently no chest pain, shortness of breath or other cardiovascular related symptoms noted.  Follow-up of diabetes. Patient does check blood sugar at home. Readings run between 90-110 and postprandial 180-200 Patient denies symptoms such as excessive hunger or urinary frequency, excessive hunger, nausea No significant hypoglycemic spells noted. Medications reviewed. Pt reports taking them regularly. Pt. denies complication/adverse reaction today.    follow-up on  thyroid. The patient has a history of hypothyroidism for many years. It has been stable recently. Pt. denies any change in  voice, loss of hair, heat or cold intolerance. Energy level has been adequate to good. Patient denies constipation and diarrhea. No myxedema. Medication is as noted below. Verified that pt is taking it daily on an empty stomach. Well tolerated.  History Morgan Schmidt has a past medical history of AIN (anal intraepithelial neoplasia) anal canal, Cancer (HCC) (03/15/2022), History of adenomatous polyp of colon, History of radiation therapy, History of thyroid cancer (06/2008), Hyperlipidemia, Hypertension, Hypothyroidism, postsurgical (06/2008), OA (osteoarthritis), Personal history of radiation therapy, Type 2 diabetes mellitus treated with insulin (HCC), and Wears dentures.   She has  a past surgical history that includes Total thyroidectomy (Bilateral, 06/30/2008); Cesarean section (1985); Knee arthroscopy (Left, 10/22/2016); Knee arthroscopy with medial menisectomy (10/22/2016); Knee arthroscopy with subchondroplasty (Left, 10/22/2016); Hip pinning, cannulated (Left, 03/10/2018); Colonoscopy with propofol (11/10/2021); Anal intraepithelial neoplasia excision (N/A, 11/30/2021); High resolution anoscopy (N/A, 11/30/2021); and Breast lumpectomy with radioactive seed and sentinel lymph node biopsy (Left, 03/22/2022).   Her family history includes Arthritis in her father; Heart disease in her mother.She reports that she quit smoking about 22 years ago. Her smoking use included cigarettes. She started smoking about 27 years ago. She has a 2.5 pack-year smoking history. She has never used smokeless tobacco. She reports that she does not drink alcohol and does not use drugs.  Current Outpatient Medications on File Prior to Visit  Medication Sig Dispense Refill   acetaminophen (TYLENOL) 325 MG tablet Take 325-650 mg by mouth every 6 (six) hours as needed (for pain or headaches).     Alcohol Swabs (B-D SINGLE USE SWABS REGULAR) PADS Use to check Blood Sugars twice daily. Dx E11.8 200 each 3   alendronate (FOSAMAX) 70 MG tablet TAKE 1 TABLET EVERY 7 DAYS WITH A FULL GLASS OF WATER ON AN EMPTY STOMACH. DO NOT LAY DOWN FOR AT LEAST 2 HOURS 12 tablet 0   anastrozole (ARIMIDEX) 1 MG tablet Take 1 tablet (1 mg total) by mouth daily. 90 tablet 3   Blood Glucose Calibration (ACCU-CHEK AVIVA) SOLN Use to check Blood Sugars twice daily. Dx E11.8 1 each 3   Blood Glucose Monitoring Suppl (ACCU-CHEK AVIVA PLUS) w/Device KIT Use to check Blood Sugars twice daily. Dx E11.8 1 kit 0   Cholecalciferol (VITAMIN D) 2000 UNITS tablet Take 1 tablet (2,000 Units total) by mouth daily. (Patient taking  differently: Take 2,000 Units by mouth every evening.) 30 tablet 11   diclofenac sodium (VOLTAREN) 1 % GEL Apply 2-4 g  topically See admin instructions. Apply 2-4 grams to each hand up to 4 times a day as needed for arthritis in the fingers (Patient taking differently: Apply 2-4 g topically See admin instructions. Apply 2-4 grams to each hand up to 4 times a day as needed for arthritis in the fingers) 100 g 11   docusate sodium (COLACE) 100 MG capsule Take 1 capsule (100 mg total) by mouth 2 (two) times daily. 180 capsule 3   glucose blood (ACCU-CHEK AVIVA PLUS) test strip check Blood Sugars twice daily. Dx E11.8 200 strip 3   insulin glargine (LANTUS SOLOSTAR) 100 UNIT/ML Solostar Pen Inject 110 Units into the skin daily after breakfast. 105 mL 3   Insulin Pen Needle (DROPLET PEN NEEDLES) 31G X 8 MM MISC USE AS DIRECTED EVERY DAY Dx E11.8 100 each 3   levothyroxine (SYNTHROID) 112 MCG tablet TAKE 1 TABLET EVERY DAY, NEW DOSE 90 tablet 3   olmesartan-hydrochlorothiazide (BENICAR HCT) 40-25 MG tablet Take 1 tablet by mouth daily. 90 tablet 1   rosuvastatin (CRESTOR) 20 MG tablet TAKE 1 TABLET EVERY DAY FOR CHOLESTEROL 90 tablet 3   No current facility-administered medications on file prior to visit.    ROS Review of Systems  Constitutional: Negative.   HENT: Negative.    Eyes:  Negative for visual disturbance.  Respiratory:  Negative for shortness of breath.   Cardiovascular:  Negative for chest pain.  Gastrointestinal:  Negative for abdominal pain.  Musculoskeletal:  Negative for arthralgias.    Objective:  BP 114/60   Pulse 80   Temp 97.6 F (36.4 C)   Ht 4\' 11"  (1.499 m)   Wt 169 lb 6.4 oz (76.8 kg)   LMP 03/02/2004   SpO2 95%   BMI 34.21 kg/m   BP Readings from Last 3 Encounters:  04/25/23 114/60  02/20/23 117/61  01/17/23 (!) 162/74    Wt Readings from Last 3 Encounters:  04/25/23 169 lb 6.4 oz (76.8 kg)  02/25/23 170 lb 6 oz (77.3 kg)  02/20/23 170 lb 9.6 oz (77.4 kg)     Physical Exam Constitutional:      General: She is not in acute distress.    Appearance: She is  well-developed.  Cardiovascular:     Rate and Rhythm: Normal rate and regular rhythm.  Pulmonary:     Breath sounds: Normal breath sounds.  Musculoskeletal:        General: Normal range of motion.  Skin:    General: Skin is warm and dry.  Neurological:     Mental Status: She is alert and oriented to person, place, and time.     Diabetic Foot Exam - Simple   No data filed     Lab Results  Component Value Date   HGBA1C 8.0 (H) 01/17/2023   HGBA1C 7.6 (H) 10/11/2022   HGBA1C 6.9 (H) 07/11/2022    Assessment & Plan:   Milen was seen today for medical management of chronic issues.  Diagnoses and all orders for this visit:  Essential hypertension -     CBC with Differential/Platelet -     CMP14+EGFR  Hypothyroidism, unspecified type -     TSH + free T4  Controlled type 2 diabetes mellitus with complication, with long-term current use of insulin (HCC) -     Bayer DCA Hb A1c Waived -  metFORMIN (GLUCOPHAGE) 1000 MG tablet; Take 1 tablet (1,000 mg total) by mouth 2 (two) times daily with a meal.  Mixed diabetic hyperlipidemia associated with type 2 diabetes mellitus (HCC) -     Lipid panel  Other orders -     Dulaglutide (TRULICITY) 4.5 MG/0.5ML SOPN; Inject 4.5 mg as directed once a week. Monday's -     Accu-Chek FastClix Lancets MISC; Use to check Blood Sugars twice daily. Dx E11.8   I am having Andee Poles. Chisolm maintain her Vitamin D, acetaminophen, diclofenac sodium, Accu-Chek Aviva Plus, Accu-Chek Aviva, B-D SINGLE USE SWABS REGULAR, docusate sodium, Lantus SoloStar, Droplet Pen Needles, anastrozole, rosuvastatin, olmesartan-hydrochlorothiazide, levothyroxine, alendronate, Accu-Chek Aviva Plus, Trulicity, metFORMIN, and Accu-Chek FastClix Lancets.  Meds ordered this encounter  Medications   Dulaglutide (TRULICITY) 4.5 MG/0.5ML SOPN    Sig: Inject 4.5 mg as directed once a week. Monday's    Dispense:  6.5 mL    Refill:  3   metFORMIN (GLUCOPHAGE) 1000 MG  tablet    Sig: Take 1 tablet (1,000 mg total) by mouth 2 (two) times daily with a meal.    Dispense:  180 tablet    Refill:  3   Accu-Chek FastClix Lancets MISC    Sig: Use to check Blood Sugars twice daily. Dx E11.8    Dispense:  204 each    Refill:  3     Follow-up: Return in about 3 months (around 07/25/2023).  Mechele Claude, M.D.

## 2023-04-26 LAB — CMP14+EGFR
ALT: 14 IU/L (ref 0–32)
AST: 22 IU/L (ref 0–40)
Albumin: 4.6 g/dL (ref 3.9–4.9)
Alkaline Phosphatase: 59 IU/L (ref 44–121)
BUN/Creatinine Ratio: 24 (ref 12–28)
BUN: 25 mg/dL (ref 8–27)
Bilirubin Total: 0.8 mg/dL (ref 0.0–1.2)
CO2: 22 mmol/L (ref 20–29)
Calcium: 8.9 mg/dL (ref 8.7–10.3)
Chloride: 99 mmol/L (ref 96–106)
Creatinine, Ser: 1.05 mg/dL — ABNORMAL HIGH (ref 0.57–1.00)
Globulin, Total: 1.8 g/dL (ref 1.5–4.5)
Glucose: 101 mg/dL — ABNORMAL HIGH (ref 70–99)
Potassium: 5 mmol/L (ref 3.5–5.2)
Sodium: 136 mmol/L (ref 134–144)
Total Protein: 6.4 g/dL (ref 6.0–8.5)
eGFR: 57 mL/min/{1.73_m2} — ABNORMAL LOW (ref >59)

## 2023-04-26 LAB — CBC WITH DIFFERENTIAL/PLATELET
Basophils Absolute: 0.1 10*3/uL (ref 0.0–0.2)
Basos: 1 %
EOS (ABSOLUTE): 0.1 10*3/uL (ref 0.0–0.4)
Eos: 2 %
Hematocrit: 35.2 % (ref 34.0–46.6)
Hemoglobin: 11.5 g/dL (ref 11.1–15.9)
Immature Grans (Abs): 0 10*3/uL (ref 0.0–0.1)
Immature Granulocytes: 0 %
Lymphocytes Absolute: 1.4 10*3/uL (ref 0.7–3.1)
Lymphs: 23 %
MCH: 27.3 pg (ref 26.6–33.0)
MCHC: 32.7 g/dL (ref 31.5–35.7)
MCV: 84 fL (ref 79–97)
Monocytes Absolute: 0.4 10*3/uL (ref 0.1–0.9)
Monocytes: 7 %
Neutrophils Absolute: 4 10*3/uL (ref 1.4–7.0)
Neutrophils: 67 %
Platelets: 214 10*3/uL (ref 150–450)
RBC: 4.21 x10E6/uL (ref 3.77–5.28)
RDW: 14 % (ref 11.7–15.4)
WBC: 6 10*3/uL (ref 3.4–10.8)

## 2023-04-26 LAB — LIPID PANEL
Chol/HDL Ratio: 3 ratio (ref 0.0–4.4)
Cholesterol, Total: 118 mg/dL (ref 100–199)
HDL: 39 mg/dL — ABNORMAL LOW (ref >39)
LDL Chol Calc (NIH): 50 mg/dL (ref 0–99)
Triglycerides: 171 mg/dL — ABNORMAL HIGH (ref 0–149)
VLDL Cholesterol Cal: 29 mg/dL (ref 5–40)

## 2023-04-26 LAB — TSH+FREE T4
Free T4: 1.84 ng/dL — ABNORMAL HIGH (ref 0.82–1.77)
TSH: 0.269 u[IU]/mL — ABNORMAL LOW (ref 0.450–4.500)

## 2023-04-29 NOTE — Progress Notes (Signed)
Hello Morgan Schmidt,  Your lab result is normal and/or stable.Some minor variations that are not significant are commonly marked abnormal, but do not represent any medical problem for you.  Best regards, Warren Stacks, M.D.

## 2023-05-01 ENCOUNTER — Other Ambulatory Visit: Payer: Self-pay | Admitting: Family Medicine

## 2023-05-01 DIAGNOSIS — Z794 Long term (current) use of insulin: Secondary | ICD-10-CM

## 2023-05-08 ENCOUNTER — Other Ambulatory Visit: Payer: Self-pay | Admitting: Family Medicine

## 2023-05-08 DIAGNOSIS — Z794 Long term (current) use of insulin: Secondary | ICD-10-CM

## 2023-05-08 MED ORDER — DROPLET PEN NEEDLES 31G X 8 MM MISC
3 refills | Status: DC
Start: 2023-05-08 — End: 2024-02-19

## 2023-05-08 NOTE — Addendum Note (Signed)
Addended by: Julious Payer D on: 05/08/2023 07:53 AM   Modules accepted: Orders

## 2023-05-15 DIAGNOSIS — H25813 Combined forms of age-related cataract, bilateral: Secondary | ICD-10-CM | POA: Diagnosis not present

## 2023-06-03 ENCOUNTER — Ambulatory Visit: Payer: Medicare HMO | Attending: Hematology and Oncology

## 2023-06-03 VITALS — Wt 163.2 lb

## 2023-06-03 DIAGNOSIS — Z483 Aftercare following surgery for neoplasm: Secondary | ICD-10-CM | POA: Insufficient documentation

## 2023-06-03 NOTE — Therapy (Signed)
OUTPATIENT PHYSICAL THERAPY SOZO SCREENING NOTE   Patient Name: Morgan Schmidt MRN: [id number] DOB:06/22/1951, 72 y.o., female Today's Date: 06/03/2023  PCP: Mechele Claude, MD REFERRING PROVIDER: Serena Croissant, MD   PT End of Session - 06/03/23 [phone]     Visit Number 5   # unchanged due to screen only   PT Start Time 0937    PT Stop Time 0941    PT Time Calculation (min) 4 min    Activity Tolerance Patient tolerated treatment well    Behavior During Therapy [facility] for tasks assessed/performed             Past Medical History:  Diagnosis Date   AIN (anal intraepithelial neoplasia) anal canal    Cancer (HCC) 03/15/2022   breast   History of adenomatous polyp of colon    History of radiation therapy    Left breast- 05/01/22-05/22/22- Dr. Antony Blackbird   History of thyroid cancer 06/2008   s/p  total bilateral thyroidectomy for symptomatic multinodular goiter;   06-30-2008 s/p total thyroidectomy -- papillary thyroid cancer  and treated w/ RAI post surgery 09-29-2008   (last scan negative for recurrence in epic 03/ 2011)   Hyperlipidemia    Hypertension    Hypothyroidism, postsurgical 06/2008   and had RAI post surgery 02/ 2010;   followed by pcp   OA (osteoarthritis)    Personal history of radiation therapy    Type 2 diabetes mellitus treated with insulin (HCC)    followed by pcp   (11-23-2021  per pt checks daily in am and at times in afternoon,  fasting sugar--- 95--110)   Wears dentures    upper   Past Surgical History:  Procedure Laterality Date   ANAL INTRAEPITHELIAL NEOPLASIA EXCISION N/A 11/30/2021   Procedure: EXCISIONAL BIOPSY ANAL LESIONS;  Surgeon: Romie Levee, MD;  Location: [facility] [place];  Service: General;  Laterality: N/A;   BREAST LUMPECTOMY WITH RADIOACTIVE SEED AND SENTINEL LYMPH NODE BIOPSY Left 03/22/2022   Procedure: LEFT BREAST LUMPECTOMY WITH RADIOACTIVE SEED AND SENTINEL LYMPH NODE BIOPSY;  Surgeon: Griselda Miner, MD;  Location:  [facility];  Service: General;  Laterality: Left;  GEN & PEC BLOCK   CESAREAN SECTION  1985   W/  BILATERAL TUBAL LIGATION   COLONOSCOPY WITH PROPOFOL  11/10/2021   by Dr.Pyrtle   HIGH RESOLUTION ANOSCOPY N/A 11/30/2021   Procedure: HIGH RESOLUTION ANOSCOPY;  Surgeon: Romie Levee, MD;  Location: [facility];  Service: General;  Laterality: N/A;   HIP PINNING,CANNULATED Left 03/10/2018   Procedure: CANNULATED HIP PINNING;  Surgeon: Durene Romans, MD;  Location: [facility];  Service: Orthopedics;  Laterality: Left;   KNEE ARTHROSCOPY Left 10/22/2016   Procedure: ARTHROSCOPY LEFT KNEE WITH DEBRIDEMENT;  Surgeon: Durene Romans, MD;  Location: [facility];  Service: Orthopedics;  Laterality: Left;   KNEE ARTHROSCOPY WITH MEDIAL MENISECTOMY  10/22/2016   Procedure: KNEE ARTHROSCOPY WITH MEDIAL MENISECTOMY;  Surgeon: Durene Romans, MD;  Location: [facility];  Service: Orthopedics;;   KNEE ARTHROSCOPY WITH SUBCHONDROPLASTY Left 10/22/2016   Procedure: KNEE ARTHROSCOPY WITH MEDIAL CHONDROPLASTY;  Surgeon: Durene Romans, MD;  Location: [facility];  Service: Orthopedics;  Laterality: Left;   TOTAL THYROIDECTOMY Bilateral 06/30/2008   @[facility]  by dr Johna Sheriff   Patient Active Problem List   Diagnosis Date Noted   Genetic testing 03/20/2022   Malignant neoplasm of upper-outer quadrant of left breast in female, estrogen receptor positive (HCC) 02/26/2022  Pain in left knee 07/01/2018   Controlled diabetes mellitus type 2 with complications (HCC) 03/19/2018   Essential hypertension 03/23/2016   Other specified hypothyroidism 12/16/2014   Mixed diabetic hyperlipidemia associated with type 2 diabetes mellitus (HCC) 12/16/2014   Long term current use of insulin (HCC) 12/16/2014   Polyposis of colon 12/16/2014    REFERRING DIAG: left breast cancer at risk for lymphedema  THERAPY DIAG: Aftercare following surgery for neoplasm  PERTINENT  HISTORY: Patient was diagnosed on 02/21/22 with left grade 2. It measures 0.7 cm and is located in the upper-outer quadrant. It is ER+, PR+, HER2- with a Ki67 of 3%. Hx of rectal cancer and surgery in 12/2021. 03/22/22- L breast lumpectomy and SLNB 0/2, completed radiation 05/22/22   PRECAUTIONS: left UE Lymphedema risk, None  SUBJECTIVE: Pt returns for her 3 month L-Dex screens. "I've been wearing my compression sleeve ~3 days/wk."  PAIN:  Are you having pain? No  SOZO SCREENING:  Patient was assessed today using the SOZO machine to determine the lymphedema index score. This was compared to her baseline score. It was determined that she is NOT within the recommended range when compared to her baseline. It seems pt now has chronic subclinical lymphedema and is self managing at home.     L-DEX FLOWSHEETS - 06/03/23 0900       L-DEX LYMPHEDEMA SCREENING   Measurement Type Unilateral    L-DEX MEASUREMENT EXTREMITY Upper Extremity    POSITION  Standing    DOMINANT SIDE Right    At Risk Side Left    BASELINE SCORE (UNILATERAL) 0    L-DEX SCORE (UNILATERAL) 7.3    VALUE CHANGE (UNILAT) 7.3            P: Cont 3 mont monitoring to make sure pt doesn't return to red and is managing her subclinical lymphedema at home.   Hermenia Bers, PTA 06/03/2023, 9:44 AM

## 2023-06-07 DIAGNOSIS — H25812 Combined forms of age-related cataract, left eye: Secondary | ICD-10-CM | POA: Diagnosis not present

## 2023-06-12 LAB — HM DIABETES EYE EXAM

## 2023-06-17 ENCOUNTER — Other Ambulatory Visit: Payer: Self-pay | Admitting: Family Medicine

## 2023-06-21 HISTORY — PX: CATARACT EXTRACTION: SUR2

## 2023-07-10 ENCOUNTER — Other Ambulatory Visit: Payer: Self-pay | Admitting: Family Medicine

## 2023-07-10 DIAGNOSIS — E118 Type 2 diabetes mellitus with unspecified complications: Secondary | ICD-10-CM

## 2023-07-25 ENCOUNTER — Ambulatory Visit (INDEPENDENT_AMBULATORY_CARE_PROVIDER_SITE_OTHER): Payer: Medicare HMO | Admitting: Family Medicine

## 2023-07-25 ENCOUNTER — Encounter: Payer: Self-pay | Admitting: Family Medicine

## 2023-07-25 VITALS — BP 116/59 | HR 71 | Temp 97.2°F | Ht 59.0 in | Wt 168.2 lb

## 2023-07-25 DIAGNOSIS — Z794 Long term (current) use of insulin: Secondary | ICD-10-CM | POA: Diagnosis not present

## 2023-07-25 DIAGNOSIS — E782 Mixed hyperlipidemia: Secondary | ICD-10-CM

## 2023-07-25 DIAGNOSIS — I10 Essential (primary) hypertension: Secondary | ICD-10-CM

## 2023-07-25 DIAGNOSIS — E1169 Type 2 diabetes mellitus with other specified complication: Secondary | ICD-10-CM | POA: Diagnosis not present

## 2023-07-25 DIAGNOSIS — E118 Type 2 diabetes mellitus with unspecified complications: Secondary | ICD-10-CM | POA: Diagnosis not present

## 2023-07-25 DIAGNOSIS — Z23 Encounter for immunization: Secondary | ICD-10-CM

## 2023-07-25 DIAGNOSIS — E038 Other specified hypothyroidism: Secondary | ICD-10-CM

## 2023-07-25 LAB — BAYER DCA HB A1C WAIVED: HB A1C (BAYER DCA - WAIVED): 6.8 % — ABNORMAL HIGH (ref 4.8–5.6)

## 2023-07-25 NOTE — Progress Notes (Signed)
Subjective:  [name],  female    DOB: 1950/12/30  Age: 72 y.o.    CC: Medical Management of Chronic Issues, Diabetes, and Hypertension   HPI Morgan Schmidt presents for  follow-up of hypertension. Patient has no history of headache chest pain or shortness of breath or recent cough. Patient also denies symptoms of TIA such as numbness weakness lateralizing. Patient denies side effects from medication. States taking it regularly.  Patient also  in for follow-up of elevated cholesterol. Doing well without complaints on current medication. Denies side effects  including myalgia and arthralgia and nausea. Also in today for liver function testing. Currently no chest pain, shortness of breath or other cardiovascular related symptoms noted.  Follow-up of diabetes. Patient does check blood sugar at home. Readings run between 100 and 150 Patient denies symptoms such as excessive hunger or urinary frequency, excessive hunger, nausea No significant hypoglycemic spells noted.Dropped to 67 a few mornings ago. Drank orange juice and sx resolved. Very rare to happen.  Medications reviewed. Pt reports taking them regularly. Pt. denies complication/adverse reaction today.    follow-up on  thyroid. The patient has a history of hypothyroidism for many years. It has been stable recently. Pt. denies any change in  voice, loss of hair, heat or cold intolerance. Energy level has been adequate to good. Patient denies constipation and diarrhea. No myxedema. Medication is as noted below. Verified that pt is taking it daily on an empty stomach. Well tolerated.    History Morgan Schmidt has a past medical history of AIN (anal intraepithelial neoplasia) anal canal, Cancer (HCC) (03/15/2022), History of adenomatous polyp of colon, History of radiation therapy, History of thyroid cancer (06/2008), Hyperlipidemia, Hypertension, Hypothyroidism, postsurgical (06/2008), OA (osteoarthritis), Personal history of  radiation therapy, Type 2 diabetes mellitus treated with insulin (HCC), and Wears dentures.   Morgan Schmidt has a past surgical history that includes Total thyroidectomy (Bilateral, 06/30/2008); Cesarean section (1985); Knee arthroscopy (Left, 10/22/2016); Knee arthroscopy with medial menisectomy (10/22/2016); Knee arthroscopy with subchondroplasty (Left, 10/22/2016); Hip pinning, cannulated (Left, 03/10/2018); Colonoscopy with propofol (11/10/2021); Anal intraepithelial neoplasia excision (N/A, 11/30/2021); High resolution anoscopy (N/A, 11/30/2021); and Breast lumpectomy with radioactive seed and sentinel lymph node biopsy (Left, 03/22/2022).   Her family history includes Arthritis in her father; Heart disease in her mother.Morgan Schmidt reports that Morgan Schmidt quit smoking about 22 years ago. Her smoking use included cigarettes. Morgan Schmidt started smoking about 27 years ago. Morgan Schmidt has a 2.5 pack-year smoking history. Morgan Schmidt has never used smokeless tobacco. Morgan Schmidt reports that Morgan Schmidt does not drink alcohol and does not use drugs.  Current Outpatient Medications on File Prior to Visit  Medication Sig Dispense Refill   Accu-Chek FastClix Lancets MISC Use to check Blood Sugars twice daily. Dx E11.8 204 each 3   acetaminophen (TYLENOL) 325 MG tablet Take 325-650 mg by mouth every 6 (six) hours as needed (for pain or headaches).     Alcohol Swabs (B-D SINGLE USE SWABS REGULAR) PADS Use to check Blood Sugars twice daily. Dx E11.8 200 each 3   alendronate (FOSAMAX) 70 MG tablet TAKE 1 TABLET EVERY 7 DAYS WITH A FULL GLASS OF WATER ON AN EMPTY STOMACH. DO NOT LAY DOWN FOR AT LEAST 2 HOURS 12 tablet 0   anastrozole (ARIMIDEX) 1 MG tablet Take 1 tablet (1 mg total) by mouth daily. 90 tablet 3   Blood Glucose Calibration (ACCU-CHEK AVIVA) SOLN Use to check Blood Sugars twice daily. Dx E11.8 1 each 3   Blood  Glucose Monitoring Suppl (ACCU-CHEK AVIVA PLUS) w/Device KIT Use to check Blood Sugars twice daily. Dx E11.8 1 kit 0   Cholecalciferol (VITAMIN D) 2000  UNITS tablet Take 1 tablet (2,000 Units total) by mouth daily. (Patient taking differently: Take 2,000 Units by mouth every evening.) 30 tablet 11   diclofenac sodium (VOLTAREN) 1 % GEL Apply 2-4 g topically See admin instructions. Apply 2-4 grams to each hand up to 4 times a day as needed for arthritis in the fingers (Patient taking differently: Apply 2-4 g topically See admin instructions. Apply 2-4 grams to each hand up to 4 times a day as needed for arthritis in the fingers) 100 g 11   docusate sodium (COLACE) 100 MG capsule Take 1 capsule (100 mg total) by mouth 2 (two) times daily. 180 capsule 3   Dulaglutide (TRULICITY) 4.5 MG/0.5ML SOPN Inject 4.5 mg as directed once a week. Monday's 6.5 mL 3   glucose blood (ACCU-CHEK AVIVA PLUS) test strip check Blood Sugars twice daily. Dx E11.8 200 strip 3   insulin glargine (LANTUS SOLOSTAR) 100 UNIT/ML Solostar Pen INJECT 110 UNITS INTO THE SKIN DAILY AFTER BREAKFAST. 105 mL 0   Insulin Pen Needle (DROPLET PEN NEEDLES) 31G X 8 MM MISC USE AS DIRECTED EVERY DAY Dx E11.8 100 each 3   levothyroxine (SYNTHROID) 112 MCG tablet TAKE 1 TABLET EVERY DAY, NEW DOSE 90 tablet 3   metFORMIN (GLUCOPHAGE) 1000 MG tablet Take 1 tablet (1,000 mg total) by mouth 2 (two) times daily with a meal. 180 tablet 3   olmesartan-hydrochlorothiazide (BENICAR HCT) 40-25 MG tablet Take 1 tablet by mouth daily. 90 tablet 1   rosuvastatin (CRESTOR) 20 MG tablet TAKE 1 TABLET EVERY DAY FOR CHOLESTEROL 90 tablet 3   No current facility-administered medications on file prior to visit.    ROS Review of Systems  Constitutional: Negative.   HENT: Negative.    Eyes:  Negative for visual disturbance.  Respiratory:  Negative for shortness of breath.   Cardiovascular:  Negative for chest pain.  Gastrointestinal:  Negative for abdominal pain.  Musculoskeletal:  Negative for arthralgias.    Objective:  BP (!) 116/59   Pulse 71   Temp (!) 97.2 F (36.2 C) (Temporal)   Ht 4\' 11"   (1.499 m)   Wt 168 lb 4 oz (76.3 kg)   LMP 03/02/2004   SpO2 96%   BMI 33.98 kg/m   BP Readings from Last 3 Encounters:  07/25/23 (!) 116/59  04/25/23 114/60  02/20/23 117/61    Wt Readings from Last 3 Encounters:  07/25/23 168 lb 4 oz (76.3 kg)  06/03/23 163 lb 4 oz (74 kg)  04/25/23 169 lb 6.4 oz (76.8 kg)     Physical Exam Constitutional:      General: Morgan Schmidt is not in acute distress.    Appearance: Morgan Schmidt is well-developed.  Cardiovascular:     Rate and Rhythm: Normal rate and regular rhythm.  Pulmonary:     Breath sounds: Normal breath sounds.  Musculoskeletal:        General: Normal range of motion.  Skin:    General: Skin is warm and dry.  Neurological:     Mental Status: Morgan Schmidt is alert and oriented to person, place, and time.     Diabetic Foot Exam - Simple   No data filed     Lab Results  Component Value Date   HGBA1C 7.1 (H) 04/25/2023   HGBA1C 8.0 (H) 01/17/2023   HGBA1C 7.6 (H) 10/11/2022  Assessment & Plan:   Morgan Schmidt was seen today for medical management of chronic issues, diabetes and hypertension.  Diagnoses and all orders for this visit:  Essential hypertension -     CBC with Differential/Platelet -     CMP14+EGFR  Controlled type 2 diabetes mellitus with complication, with long-term current use of insulin (HCC) -     Microalbumin / creatinine urine ratio -     Vitamin B12 -     Bayer DCA Hb A1c Waived  Mixed diabetic hyperlipidemia associated with type 2 diabetes mellitus (HCC) -     Lipid panel  Other specified hypothyroidism -     TSH + free T4   I am having Morgan Schmidt. Morgan Schmidt maintain her Vitamin D, acetaminophen, diclofenac sodium, Accu-Chek Aviva Plus, Accu-Chek Aviva, B-D SINGLE USE SWABS REGULAR, docusate sodium, anastrozole, rosuvastatin, olmesartan-hydrochlorothiazide, levothyroxine, Accu-Chek Aviva Plus, Trulicity, metFORMIN, Accu-Chek FastClix Lancets, Droplet Pen Needles, alendronate, and Lantus SoloStar.  No orders of the  defined types were placed in this encounter.    Follow-up: Return in about 3 months (around 10/23/2023).  Mechele Claude, M.D.

## 2023-07-26 LAB — CMP14+EGFR
ALT: 14 [IU]/L (ref 0–32)
AST: 18 [IU]/L (ref 0–40)
Albumin: 4.5 g/dL (ref 3.9–4.9)
Alkaline Phosphatase: 65 [IU]/L (ref 44–121)
BUN/Creatinine Ratio: 20 (ref 12–28)
BUN: 19 mg/dL (ref 8–27)
Bilirubin Total: 0.7 mg/dL (ref 0.0–1.2)
CO2: 25 mmol/L (ref 20–29)
Calcium: 9 mg/dL (ref 8.7–10.3)
Chloride: 99 mmol/L (ref 96–106)
Creatinine, Ser: 0.95 mg/dL (ref 0.57–1.00)
Globulin, Total: 2.1 g/dL (ref 1.5–4.5)
Glucose: 73 mg/dL (ref 70–99)
Potassium: 4.3 mmol/L (ref 3.5–5.2)
Sodium: 139 mmol/L (ref 134–144)
Total Protein: 6.6 g/dL (ref 6.0–8.5)
eGFR: 64 mL/min/{1.73_m2} (ref >59)

## 2023-07-26 LAB — CBC WITH DIFFERENTIAL/PLATELET
Basophils Absolute: 0.1 10*3/uL (ref 0.0–0.2)
Basos: 1 %
EOS (ABSOLUTE): 0.1 10*3/uL (ref 0.0–0.4)
Eos: 2 %
Hematocrit: 35.3 % (ref 34.0–46.6)
Hemoglobin: 11.5 g/dL (ref 11.1–15.9)
Immature Grans (Abs): 0 10*3/uL (ref 0.0–0.1)
Immature Granulocytes: 0 %
Lymphocytes Absolute: 1 10*3/uL (ref 0.7–3.1)
Lymphs: 14 %
MCH: 27.5 pg (ref 26.6–33.0)
MCHC: 32.6 g/dL (ref 31.5–35.7)
MCV: 84 fL (ref 79–97)
Monocytes Absolute: 0.3 10*3/uL (ref 0.1–0.9)
Monocytes: 5 %
Neutrophils Absolute: 5.3 10*3/uL (ref 1.4–7.0)
Neutrophils: 78 %
Platelets: 203 10*3/uL (ref 150–450)
RBC: 4.18 x10E6/uL (ref 3.77–5.28)
RDW: 13.4 % (ref 11.7–15.4)
WBC: 6.8 10*3/uL (ref 3.4–10.8)

## 2023-07-26 LAB — VITAMIN B12: Vitamin B-12: 475 pg/mL (ref 232–1245)

## 2023-07-26 LAB — LIPID PANEL
Chol/HDL Ratio: 2.7 {ratio} (ref 0.0–4.4)
Cholesterol, Total: 120 mg/dL (ref 100–199)
HDL: 45 mg/dL (ref >39)
LDL Chol Calc (NIH): 52 mg/dL (ref 0–99)
Triglycerides: 130 mg/dL (ref 0–149)
VLDL Cholesterol Cal: 23 mg/dL (ref 5–40)

## 2023-07-26 LAB — TSH+FREE T4
Free T4: 1.84 ng/dL — ABNORMAL HIGH (ref 0.82–1.77)
TSH: 0.114 u[IU]/mL — ABNORMAL LOW (ref 0.450–4.500)

## 2023-07-26 LAB — MICROALBUMIN / CREATININE URINE RATIO
Creatinine, Urine: 71.7 mg/dL
Microalb/Creat Ratio: 7 mg/g{creat} (ref 0–29)
Microalbumin, Urine: 5.1 ug/mL

## 2023-07-29 ENCOUNTER — Other Ambulatory Visit: Payer: Self-pay | Admitting: Family Medicine

## 2023-07-29 DIAGNOSIS — E038 Other specified hypothyroidism: Secondary | ICD-10-CM

## 2023-07-29 MED ORDER — LEVOTHYROXINE SODIUM 100 MCG PO TABS: 100.0000 ug | ORAL_TABLET | Freq: Every day | ORAL | 1 refills | Status: DC

## 2023-07-30 ENCOUNTER — Encounter: Payer: Self-pay | Admitting: Family Medicine

## 2023-07-30 ENCOUNTER — Other Ambulatory Visit: Payer: Self-pay | Admitting: *Deleted

## 2023-07-30 DIAGNOSIS — E038 Other specified hypothyroidism: Secondary | ICD-10-CM

## 2023-08-26 ENCOUNTER — Other Ambulatory Visit: Payer: Self-pay | Admitting: Family Medicine

## 2023-09-06 ENCOUNTER — Ambulatory Visit: Payer: Medicare HMO | Attending: Hematology and Oncology

## 2023-09-06 VITALS — Wt 168.2 lb

## 2023-09-06 DIAGNOSIS — Z483 Aftercare following surgery for neoplasm: Secondary | ICD-10-CM | POA: Insufficient documentation

## 2023-09-06 NOTE — Therapy (Signed)
OUTPATIENT PHYSICAL THERAPY SOZO SCREENING NOTE   Patient Name: Morgan Schmidt MRN: [id number] DOB:1950/08/26, 73 y.o., female Today's Date: 09/06/2023  PCP: Mechele Claude, MD REFERRING PROVIDER: Serena Croissant, MD   PT End of Session - 09/06/23 1120     Visit Number 5   # unchanged due to screen only   PT Start Time 1104    PT Stop Time 1108    PT Time Calculation (min) 4 min    Activity Tolerance Patient tolerated treatment well    Behavior During Therapy [facility] for tasks assessed/performed             Past Medical History:  Diagnosis Date   AIN (anal intraepithelial neoplasia) anal canal    Cancer (HCC) 03/15/2022   breast   History of adenomatous polyp of colon    History of radiation therapy    Left breast- 05/01/22-05/22/22- Dr. Antony Blackbird   History of thyroid cancer 06/2008   s/p  total bilateral thyroidectomy for symptomatic multinodular goiter;   06-30-2008 s/p total thyroidectomy -- papillary thyroid cancer  and treated w/ RAI post surgery 09-29-2008   (last scan negative for recurrence in epic 03/ 2011)   Hyperlipidemia    Hypertension    Hypothyroidism, postsurgical 06/2008   and had RAI post surgery 02/ 2010;   followed by pcp   OA (osteoarthritis)    Personal history of radiation therapy    Type 2 diabetes mellitus treated with insulin (HCC)    followed by pcp   (11-23-2021  per pt checks daily in am and at times in afternoon,  fasting sugar--- 95--110)   Wears dentures    upper   Past Surgical History:  Procedure Laterality Date   ANAL INTRAEPITHELIAL NEOPLASIA EXCISION N/A 11/30/2021   Procedure: EXCISIONAL BIOPSY ANAL LESIONS;  Surgeon: Romie Levee, MD;  Location: [facility];  Service: General;  Laterality: N/A;   BREAST LUMPECTOMY WITH RADIOACTIVE SEED AND SENTINEL LYMPH NODE BIOPSY Left 03/22/2022   Procedure: LEFT BREAST LUMPECTOMY WITH RADIOACTIVE SEED AND SENTINEL LYMPH NODE BIOPSY;  Surgeon: Griselda Miner, MD;  Location:  [facility];  Service: General;  Laterality: Left;  GEN & PEC BLOCK   CESAREAN SECTION  1985   W/  BILATERAL TUBAL LIGATION   COLONOSCOPY WITH PROPOFOL  11/10/2021   by Dr.Pyrtle   HIGH RESOLUTION ANOSCOPY N/A 11/30/2021   Procedure: HIGH RESOLUTION ANOSCOPY;  Surgeon: Romie Levee, MD;  Location: [facility];  Service: General;  Laterality: N/A;   HIP PINNING,CANNULATED Left 03/10/2018   Procedure: CANNULATED HIP PINNING;  Surgeon: Durene Romans, MD;  Location: [facility] OR;  Service: Orthopedics;  Laterality: Left;   KNEE ARTHROSCOPY Left 10/22/2016   Procedure: ARTHROSCOPY LEFT KNEE WITH DEBRIDEMENT;  Surgeon: Durene Romans, MD;  Location: [facility];  Service: Orthopedics;  Laterality: Left;   KNEE ARTHROSCOPY WITH MEDIAL MENISECTOMY  10/22/2016   Procedure: KNEE ARTHROSCOPY WITH MEDIAL MENISECTOMY;  Surgeon: Durene Romans, MD;  Location: [facility];  Service: Orthopedics;;   KNEE ARTHROSCOPY WITH SUBCHONDROPLASTY Left 10/22/2016   Procedure: KNEE ARTHROSCOPY WITH MEDIAL CHONDROPLASTY;  Surgeon: Durene Romans, MD;  Location: [facility];  Service: Orthopedics;  Laterality: Left;   TOTAL THYROIDECTOMY Bilateral 06/30/2008   @[facility]  by dr Johna Sheriff   Patient Active Problem List   Diagnosis Date Noted   Genetic testing 03/20/2022   Malignant neoplasm of upper-outer quadrant of left breast in female, estrogen receptor positive (HCC) 02/26/2022  Pain in left knee 07/01/2018   Controlled diabetes mellitus type 2 with complications (HCC) 03/19/2018   Essential hypertension 03/23/2016   Other specified hypothyroidism 12/16/2014   Mixed diabetic hyperlipidemia associated with type 2 diabetes mellitus (HCC) 12/16/2014   Long term current use of insulin (HCC) 12/16/2014   Polyposis of colon 12/16/2014    REFERRING DIAG: left breast cancer at risk for lymphedema  THERAPY DIAG: Aftercare following surgery for neoplasm  PERTINENT  HISTORY: Patient was diagnosed on 02/21/22 with left grade 2. It measures 0.7 cm and is located in the upper-outer quadrant. It is ER+, PR+, HER2- with a Ki67 of 3%. Hx of rectal cancer and surgery in 12/2021. 03/22/22- L breast lumpectomy and SLNB 0/2, completed radiation 05/22/22   PRECAUTIONS: left UE Lymphedema risk, None  SUBJECTIVE: Pt returns for her 3 month L-Dex screens. "I didn't wear my sleeve as much over the holidays because  I would just get busy and forget about it. I'm going to start wearing it more again."  PAIN:  Are you having pain? No  SOZO SCREENING:  Patient was assessed today using the SOZO machine to determine the lymphedema index score. This was compared to her baseline score. It was determined that she is NOT within the recommended range when compared to her baseline. It seems pt now has chronic subclinical lymphedema and is self managing at home.     L-DEX FLOWSHEETS - 09/06/23 1100       L-DEX LYMPHEDEMA SCREENING   Measurement Type Unilateral    L-DEX MEASUREMENT EXTREMITY Upper Extremity    POSITION  Standing    DOMINANT SIDE Right    At Risk Side Left    BASELINE SCORE (UNILATERAL) 0    L-DEX SCORE (UNILATERAL) 7.1    VALUE CHANGE (UNILAT) 7.1            P: Cont 3 mont monitoring to make sure pt doesn't return to red and is managing her subclinical lymphedema at home.   Hermenia Bers, PTA 09/06/2023, 11:22 AM

## 2023-09-18 ENCOUNTER — Other Ambulatory Visit: Payer: Self-pay | Admitting: Family Medicine

## 2023-09-18 DIAGNOSIS — Z794 Long term (current) use of insulin: Secondary | ICD-10-CM

## 2023-09-28 ENCOUNTER — Other Ambulatory Visit: Payer: Self-pay | Admitting: Family Medicine

## 2023-10-02 ENCOUNTER — Other Ambulatory Visit: Payer: Self-pay | Admitting: Hematology and Oncology

## 2023-10-09 ENCOUNTER — Ambulatory Visit: Payer: Medicare HMO

## 2023-10-09 VITALS — Ht 59.0 in | Wt 168.0 lb

## 2023-10-09 DIAGNOSIS — Z0000 Encounter for general adult medical examination without abnormal findings: Secondary | ICD-10-CM | POA: Diagnosis not present

## 2023-10-09 NOTE — Progress Notes (Signed)
Subjective:   Morgan Schmidt is a 73 y.o. female who presents for Medicare Annual (Subsequent) preventive examination.  Visit Complete: Virtual I connected with  Morgan Schmidt on 10/09/23 by a audio enabled telemedicine application and verified that I am speaking with the correct person using two identifiers.  Patient Location: Home  Provider Location: Home Office  This patient declined Interactive audio and video telecommunications. Therefore the visit was completed with audio only.  I discussed the limitations of evaluation and management by telemedicine. The patient expressed understanding and agreed to proceed.  Vital Signs: Because this visit was a virtual/telehealth visit, some criteria may be missing or patient reported. Any vitals not documented were not able to be obtained and vitals that have been documented are patient reported.  Cardiac Risk Factors include: advanced age (>58men, >60 women);diabetes mellitus;dyslipidemia;hypertension     Objective:    Today's Vitals   10/09/23 1243  Weight: 168 lb (76.2 kg)  Height: 4\' 11"  (1.499 m)   Body mass index is 33.93 kg/m.     10/09/2023   12:57 PM 02/20/2023    9:35 AM 12/25/2022   12:03 PM 10/04/2022    1:59 PM 10/02/2022   11:59 AM 06/25/2022   10:50 AM 05/22/2022   11:40 AM  Advanced Directives  Does Patient Have a Medical Advance Directive? No No No No No No No  Would patient like information on creating a medical advance directive? Yes (MAU/Ambulatory/Procedural Areas - Information given) No - Patient declined No - Patient declined No - Patient declined No - Patient declined No - Patient declined No - Patient declined    Current Medications (verified) Outpatient Encounter Medications as of 10/09/2023  Medication Sig   Accu-Chek FastClix Lancets MISC Use to check Blood Sugars twice daily. Dx E11.8   acetaminophen (TYLENOL) 325 MG tablet Take 325-650 mg by mouth every 6 (six) hours as needed (for pain or  headaches).   Alcohol Swabs (B-D SINGLE USE SWABS REGULAR) PADS Use to check Blood Sugars twice daily. Dx E11.8   alendronate (FOSAMAX) 70 MG tablet TAKE 1 TABLET EVERY 7 DAYS WITH A FULL GLASS OF WATER ON AN EMPTY STOMACH. DO NOT LAY DOWN FOR AT LEAST 2 HOURS   anastrozole (ARIMIDEX) 1 MG tablet TAKE 1 TABLET EVERY DAY   Blood Glucose Calibration (ACCU-CHEK AVIVA) SOLN Use to check Blood Sugars twice daily. Dx E11.8   Blood Glucose Monitoring Suppl (ACCU-CHEK AVIVA PLUS) w/Device KIT Use to check Blood Sugars twice daily. Dx E11.8   Cholecalciferol (VITAMIN D) 2000 UNITS tablet Take 1 tablet (2,000 Units total) by mouth daily. (Patient taking differently: Take 2,000 Units by mouth every evening.)   diclofenac sodium (VOLTAREN) 1 % GEL Apply 2-4 g topically See admin instructions. Apply 2-4 grams to each hand up to 4 times a day as needed for arthritis in the fingers (Patient taking differently: Apply 2-4 g topically See admin instructions. Apply 2-4 grams to each hand up to 4 times a day as needed for arthritis in the fingers)   docusate sodium (COLACE) 100 MG capsule Take 1 capsule (100 mg total) by mouth 2 (two) times daily.   Dulaglutide (TRULICITY) 4.5 MG/0.5ML SOPN Inject 4.5 mg as directed once a week. Monday's   glucose blood (ACCU-CHEK AVIVA PLUS) test strip check Blood Sugars twice daily. Dx E11.8   insulin glargine (LANTUS SOLOSTAR) 100 UNIT/ML Solostar Pen INJECT 110 UNITS INTO THE SKIN DAILY AFTER BREAKFAST.   Insulin Pen Needle (DROPLET PEN  NEEDLES) 31G X 8 MM MISC USE AS DIRECTED EVERY DAY Dx E11.8   levothyroxine (SYNTHROID) 100 MCG tablet Take 1 tablet (100 mcg total) by mouth daily before breakfast.   metFORMIN (GLUCOPHAGE) 1000 MG tablet Take 1 tablet (1,000 mg total) by mouth 2 (two) times daily with a meal.   olmesartan-hydrochlorothiazide (BENICAR HCT) 40-25 MG tablet TAKE 1 TABLET EVERY DAY   rosuvastatin (CRESTOR) 20 MG tablet TAKE 1 TABLET EVERY DAY FOR CHOLESTEROL   No  facility-administered encounter medications on file as of 10/09/2023.    Allergies (verified) Lipitor [atorvastatin calcium] and Ace inhibitors   History: Past Medical History:  Diagnosis Date   AIN (anal intraepithelial neoplasia) anal canal    Cancer (HCC) 03/15/2022   breast   History of adenomatous polyp of colon    History of radiation therapy    Left breast- 05/01/22-05/22/22- Dr. Antony Blackbird   History of thyroid cancer 06/2008   s/p  total bilateral thyroidectomy for symptomatic multinodular goiter;   06-30-2008 s/p total thyroidectomy -- papillary thyroid cancer  and treated w/ RAI post surgery 09-29-2008   (last scan negative for recurrence in epic 03/ 2011)   Hyperlipidemia    Hypertension    Hypothyroidism, postsurgical 06/2008   and had RAI post surgery 02/ 2010;   followed by pcp   OA (osteoarthritis)    Personal history of radiation therapy    Type 2 diabetes mellitus treated with insulin (HCC)    followed by pcp   (11-23-2021  per pt checks daily in am and at times in afternoon,  fasting sugar--- 95--110)   Wears dentures    upper   Past Surgical History:  Procedure Laterality Date   ANAL INTRAEPITHELIAL NEOPLASIA EXCISION N/A 11/30/2021   Procedure: EXCISIONAL BIOPSY ANAL LESIONS;  Surgeon: Romie Levee, MD;  Location: [facility];  Service: General;  Laterality: N/A;   BREAST LUMPECTOMY WITH RADIOACTIVE SEED AND SENTINEL LYMPH NODE BIOPSY Left 03/22/2022   Procedure: LEFT BREAST LUMPECTOMY WITH RADIOACTIVE SEED AND SENTINEL LYMPH NODE BIOPSY;  Surgeon: Griselda Miner, MD;  Location: [facility];  Service: General;  Laterality: Left;  GEN & PEC BLOCK   CESAREAN SECTION  1985   W/  BILATERAL TUBAL LIGATION   COLONOSCOPY WITH PROPOFOL  11/10/2021   by Dr.Pyrtle   HIGH RESOLUTION ANOSCOPY N/A 11/30/2021   Procedure: HIGH RESOLUTION ANOSCOPY;  Surgeon: Romie Levee, MD;  Location: [facility];  Service: General;   Laterality: N/A;   HIP PINNING,CANNULATED Left 03/10/2018   Procedure: CANNULATED HIP PINNING;  Surgeon: Durene Romans, MD;  Location: [facility] OR;  Service: Orthopedics;  Laterality: Left;   KNEE ARTHROSCOPY Left 10/22/2016   Procedure: ARTHROSCOPY LEFT KNEE WITH DEBRIDEMENT;  Surgeon: Durene Romans, MD;  Location: [facility];  Service: Orthopedics;  Laterality: Left;   KNEE ARTHROSCOPY WITH MEDIAL MENISECTOMY  10/22/2016   Procedure: KNEE ARTHROSCOPY WITH MEDIAL MENISECTOMY;  Surgeon: Durene Romans, MD;  Location: [facility];  Service: Orthopedics;;   KNEE ARTHROSCOPY WITH SUBCHONDROPLASTY Left 10/22/2016   Procedure: KNEE ARTHROSCOPY WITH MEDIAL CHONDROPLASTY;  Surgeon: Durene Romans, MD;  Location: [facility];  Service: Orthopedics;  Laterality: Left;   TOTAL THYROIDECTOMY Bilateral 06/30/2008   @[facility]  by dr Johna Sheriff   Family History  Problem Relation Age of Onset   Heart disease Mother    Arthritis Father    Colon cancer Neg Hx    Esophageal cancer Neg Hx  Rectal cancer Neg Hx    Stomach cancer Neg Hx    Breast cancer Neg Hx    Colon polyps Neg Hx    Social History   Socioeconomic History   Marital status: Significant Other    Spouse name: Not on file   Number of children: 3   Years of education: 31   Highest education level: Associate degree: occupational, Scientist, product/process development, or vocational program  Occupational History   Occupation: retired  Tobacco Use   Smoking status: Former    Current packs/day: 0.00    Average packs/day: 0.5 packs/day for 5.0 years (2.5 ttl pk-yrs)    Types: Cigarettes    Start date: 33    Quit date: 2002    Years since quitting: 23.1   Smokeless tobacco: Never  Vaping Use   Vaping status: Never Used  Substance and Sexual Activity   Alcohol use: No   Drug use: Never   Sexual activity: Not on file  Other Topics Concern   Not on file  Social History Narrative   Not on file   Social Drivers of Health    Financial Resource Strain: Low Risk  (10/09/2023)   Overall Financial Resource Strain (CARDIA)    Difficulty of Paying Living Expenses: Not hard at all  Food Insecurity: No Food Insecurity (10/09/2023)   Hunger Vital Sign    Worried About Running Out of Food in the Last Year: Never true    Ran Out of Food in the Last Year: Never true  Transportation Needs: No Transportation Needs (10/09/2023)   PRAPARE - Administrator, Civil Service (Medical): No    Lack of Transportation (Non-Medical): No  Physical Activity: Insufficiently Active (10/09/2023)   Exercise Vital Sign    Days of Exercise per Week: 3 days    Minutes of Exercise per Session: 30 min  Stress: No Stress Concern Present (10/09/2023)   [employer] of Occupational Health - Occupational Stress Questionnaire    Feeling of Stress : Not at all  Social Connections: Moderately Isolated (10/09/2023)   Social Connection and Isolation Panel [NHANES]    Frequency of Communication with Friends and Family: More than three times a week    Frequency of Social Gatherings with Friends and Family: Three times a week    Attends Religious Services: 1 to 4 times per year    Active Member of Clubs or Organizations: No    Attends Banker Meetings: Never    Marital Status: Divorced    Tobacco Counseling Counseling given: Not Answered   Clinical Intake:  Pre-visit preparation completed: Yes  Pain : No/denies pain     Diabetes: Yes CBG done?: No Did pt. bring in CBG monitor from home?: No  How often do you need to have someone help you when you read instructions, pamphlets, or other written materials from your doctor or pharmacy?: 1 - Never  Interpreter Needed?: No  Information entered by :: Kandis Fantasia LPN   Activities of Daily Living    10/09/2023   12:56 PM  In your present state of health, do you have any difficulty performing the following activities:  Hearing? 0  Vision? 0  Difficulty  concentrating or making decisions? 0  Walking or climbing stairs? 0  Dressing or bathing? 0  Doing errands, shopping? 0  Preparing Food and eating ? N  Using the Toilet? N  In the past six months, have you accidently leaked urine? N  Do you have problems with loss  of bowel control? N  Managing your Medications? N  Managing your Finances? N  Housekeeping or managing your Housekeeping? N    Patient Care Team: Mechele Claude, MD as PCP - General (Family Medicine) Griselda Miner, MD as Consulting Physician (General Surgery) Serena Croissant, MD as Consulting Physician (Hematology and Oncology) Antony Blackbird, MD as Consulting Physician (Radiation Oncology) Serena Croissant, MD as Consulting Physician (Hematology and Oncology)  Indicate any recent Medical Services you may have received from other than [facility] providers in the past year (date may be approximate).     Assessment:   This is a routine wellness examination for Sonji.  Hearing/Vision screen Hearing Screening - Comments:: Denies hearing difficulties   Vision Screening - Comments::  up to date with routine eye exams with [facility]     Goals Addressed   None   Depression Screen    10/09/2023   12:56 PM 07/25/2023   10:50 AM 04/25/2023    9:48 AM 01/17/2023   11:04 AM 10/11/2022   10:25 AM 10/02/2022   11:58 AM 07/11/2022    9:58 AM  PHQ 2/9 Scores  PHQ - 2 Score 0 0 0 0 0 0 0  PHQ- 9 Score  0         Fall Risk    10/09/2023   12:56 PM 07/25/2023   10:50 AM 04/25/2023    9:48 AM 01/17/2023   11:04 AM 10/11/2022   10:25 AM  Fall Risk   Falls in the past year? 0 0 0 0 0  Number falls in past yr: 0      Injury with Fall? 0      Risk for fall due to : No Fall Risks      Follow up Falls prevention discussed;Education provided;Falls evaluation completed        MEDICARE RISK AT HOME: Medicare Risk at Home Any stairs in or around the home?: No If so, are there any without handrails?: No Home free of loose throw  rugs in walkways, pet beds, electrical cords, etc?: Yes Adequate lighting in your home to reduce risk of falls?: Yes Life alert?: No Use of a cane, walker or w/c?: No Grab bars in the bathroom?: Yes Shower chair or bench in shower?: No Elevated toilet seat or a handicapped toilet?: Yes  TIMED UP AND GO:  Was the test performed?  No    Cognitive Function:    09/23/2018    7:59 AM  MMSE - Mini Mental State Exam  Orientation to time 5  Orientation to Place 5  Registration 3  Attention/ Calculation 5  Recall 3  Language- name 2 objects 2  Language- repeat 1  Language- follow 3 step command 3  Language- read & follow direction 1  Write a sentence 1  Copy design 1  Total score 30        10/09/2023   12:57 PM 10/02/2022   12:00 PM 09/28/2020    2:35 PM 09/25/2019   11:11 AM  6CIT Screen  What Year? 0 points 0 points 0 points 0 points  What month? 0 points 0 points 0 points 0 points  What time? 0 points 0 points 0 points 0 points  Count back from 20 0 points 0 points 0 points 0 points  Months in reverse 0 points 0 points 0 points 0 points  Repeat phrase 0 points 0 points 0 points 0 points  Total Score 0 points 0 points 0 points 0 points  Immunizations Immunization History  Administered Date(s) Administered   Fluad Quad(high Dose 65+) 07/09/2019, 05/26/2020, 05/10/2021, 07/11/2022   Fluad Trivalent(High Dose 65+) 07/25/2023   Influenza, High Dose Seasonal PF 06/03/2018   Influenza-Unspecified 05/13/2017   Moderna Sars-Covid-2 Vaccination 12/17/2019, 01/14/2020   Pneumococcal Conjugate-13 09/03/2018   Pneumococcal Polysaccharide-23 10/26/2019   Zoster Recombinant(Shingrix) 09/07/2021, 04/02/2022    TDAP status: Due, Education has been provided regarding the importance of this vaccine. Advised may receive this vaccine at local pharmacy or Health Dept. Aware to provide a copy of the vaccination record if obtained from local pharmacy or Health Dept. Verbalized acceptance and  understanding.  Flu Vaccine status: Up to date  Pneumococcal vaccine status: Up to date  Covid-19 vaccine status: Information provided on how to obtain vaccines.   Qualifies for Shingles Vaccine? Yes   Zostavax completed No   Shingrix Completed?: Yes  Screening Tests Health Maintenance  Topic Date Due   FOOT EXAM  07/12/2023   DTaP/Tdap/Td (1 - Tdap) 07/24/2024 (Originally 03/02/1970)   Hepatitis C Screening  07/24/2024 (Originally 03/03/1969)   HEMOGLOBIN A1C  01/23/2024   MAMMOGRAM  02/18/2024   OPHTHALMOLOGY EXAM  06/11/2024   DEXA SCAN  06/13/2024   Diabetic kidney evaluation - eGFR measurement  07/24/2024   Diabetic kidney evaluation - Urine ACR  07/24/2024   Medicare Annual Wellness (AWV)  10/08/2024   Colonoscopy  11/10/2024   Pneumonia Vaccine 19+ Years old  Completed   INFLUENZA VACCINE  Completed   Zoster Vaccines- Shingrix  Completed   HPV VACCINES  Aged Out   COVID-19 Vaccine  Discontinued    Health Maintenance  Health Maintenance Due  Topic Date Due   FOOT EXAM  07/12/2023    Colorectal cancer screening: Type of screening: Colonoscopy. Completed 11/10/21. Repeat every 3 years  Mammogram status: Completed 02/18/23. Repeat every year  Bone Density status: Completed 06/13/22. Results reflect: Bone density results: OSTEOPOROSIS. Repeat every 2 years.  Lung Cancer Screening: (Low Dose CT Chest recommended if Age 4-80 years, 20 pack-year currently smoking OR have quit w/in 15years.) does not qualify.   Lung Cancer Screening Referral: n/a  Additional Screening:  Hepatitis C Screening: does qualify  Vision Screening: Recommended annual ophthalmology exams for early detection of glaucoma and other disorders of the eye. Is the patient up to date with their annual eye exam?  Yes  Who is the provider or what is the name of the office in which the patient attends annual eye exams? [facility] If pt is not established with a provider, would they like to  be referred to a provider to establish care? No .   Dental Screening: Recommended annual dental exams for proper oral hygiene  Diabetic Foot Exam: Diabetic Foot Exam: Overdue, Pt has been advised about the importance in completing this exam. Pt is scheduled for diabetic foot exam on at next office visit.  Community Resource Referral / Chronic Care Management: CRR required this visit?  No   CCM required this visit?  No     Plan:     I have personally reviewed and noted the following in the patient's chart:   Medical and social history Use of alcohol, tobacco or illicit drugs  Current medications and supplements including opioid prescriptions. Patient is not currently taking opioid prescriptions. Functional ability and status Nutritional status Physical activity Advanced directives List of other physicians Hospitalizations, surgeries, and ER visits in previous 12 months Vitals Screenings to include cognitive, depression, and falls Referrals and appointments  In addition, I have reviewed and discussed with patient certain preventive protocols, quality metrics, and best practice recommendations. A written personalized care plan for preventive services as well as general preventive health recommendations were provided to patient.     Kandis Fantasia [address]. [place], [place]   1/61/0960   After Visit Summary: (MyChart) Due to this being a telephonic visit, the after visit summary with patients personalized plan was offered to patient via MyChart   Nurse Notes: No concerns at this time

## 2023-10-09 NOTE — Patient Instructions (Signed)
Morgan Schmidt , Thank you for taking time to come for your Medicare Wellness Visit. I appreciate your ongoing commitment to your health goals. Please review the following plan we discussed and let me know if I can assist you in the future.   Referrals/Orders/Follow-Ups/Clinician Recommendations: Aim for 30 minutes of exercise or brisk walking, 6-8 glasses of water, and 5 servings of fruits and vegetables each day.  This is a list of the screening recommended for you and due dates:  Health Maintenance  Topic Date Due   Complete foot exam   07/12/2023   DTaP/Tdap/Td vaccine (1 - Tdap) 07/24/2024*   Hepatitis C Screening  07/24/2024*   Hemoglobin A1C  01/23/2024   Mammogram  02/18/2024   Eye exam for diabetics  06/11/2024   DEXA scan (bone density measurement)  06/13/2024   Yearly kidney function blood test for diabetes  07/24/2024   Yearly kidney health urinalysis for diabetes  07/24/2024   Medicare Annual Wellness Visit  10/08/2024   Colon Cancer Screening  11/10/2024   Pneumonia Vaccine  Completed   Flu Shot  Completed   Zoster (Shingles) Vaccine  Completed   HPV Vaccine  Aged Out   COVID-19 Vaccine  Discontinued  *Topic was postponed. The date shown is not the original due date.    Advanced directives: (ACP Link)Information on Advanced Care Planning can be found at [facility] Advance Health Care Directives Advance Health Care Directives ([email or web address]   Next Medicare Annual Wellness Visit scheduled for next year: Yes

## 2023-10-23 ENCOUNTER — Ambulatory Visit: Payer: Medicare HMO | Admitting: Family Medicine

## 2023-10-23 VITALS — BP 99/60 | HR 96 | Temp 97.9°F | Ht 59.0 in | Wt 164.0 lb

## 2023-10-23 DIAGNOSIS — Z794 Long term (current) use of insulin: Secondary | ICD-10-CM

## 2023-10-23 DIAGNOSIS — E1169 Type 2 diabetes mellitus with other specified complication: Secondary | ICD-10-CM

## 2023-10-23 DIAGNOSIS — I10 Essential (primary) hypertension: Secondary | ICD-10-CM

## 2023-10-23 DIAGNOSIS — E782 Mixed hyperlipidemia: Secondary | ICD-10-CM | POA: Diagnosis not present

## 2023-10-23 DIAGNOSIS — E118 Type 2 diabetes mellitus with unspecified complications: Secondary | ICD-10-CM | POA: Diagnosis not present

## 2023-10-23 DIAGNOSIS — E038 Other specified hypothyroidism: Secondary | ICD-10-CM | POA: Diagnosis not present

## 2023-10-23 LAB — BAYER DCA HB A1C WAIVED: HB A1C (BAYER DCA - WAIVED): 8.2 % — ABNORMAL HIGH (ref 4.8–5.6)

## 2023-10-23 LAB — LIPID PANEL

## 2023-10-23 MED ORDER — OLMESARTAN MEDOXOMIL-HCTZ 40-25 MG PO TABS: 1.0000 | ORAL_TABLET | Freq: Every day | ORAL | 0 refills | Status: DC

## 2023-10-23 MED ORDER — ONDANSETRON 8 MG PO TBDP: 8.0000 mg | ORAL_TABLET | Freq: Four times a day (QID) | ORAL | 1 refills | Status: AC | PRN

## 2023-10-23 MED ORDER — ROSUVASTATIN CALCIUM 20 MG PO TABS: ORAL_TABLET | ORAL | 3 refills | Status: DC

## 2023-10-23 NOTE — Progress Notes (Signed)
 Subjective:  [name],  female    DOB: [birth year]  Age: 73 y.o.    CC: Medical Management of Chronic Issues (No concerns)   HPI Morgan Schmidt presents for  follow-up of hypertension. Patient has no history of headache chest pain or shortness of breath or recent cough. Patient also denies symptoms of TIA such as numbness weakness lateralizing. Patient denies side effects from medication. States taking it regularly.  Patient also  in for follow-up of elevated cholesterol. Doing well without complaints on current medication. Denies side effects  including myalgia and arthralgia and nausea. Also in today for liver function testing. Currently no chest pain, shortness of breath or other cardiovascular related symptoms noted.  Follow-up of diabetes. Patient does check blood sugar at home. Readings run between 100 and 110 Patient denies symptoms such as excessive hunger or urinary frequency, excessive hunger, nausea No significant hypoglycemic spells noted. Medications reviewed. Nausea for the last month with Trulicity. Lasting 2 days.    follow-up on  thyroid. The patient has a history of hypothyroidism for many years. It has been stable recently. Pt. denies any change in  voice, loss of hair, heat or cold intolerance. Energy level has been adequate to good. Patient denies constipation and diarrhea. No myxedema. Medication is as noted below. Verified that pt is taking it daily on an empty stomach. Well tolerated.   History Morgan Schmidt has a past medical history of AIN (anal intraepithelial neoplasia) anal canal, Cancer (HCC) (03/15/2022), History of adenomatous polyp of colon, History of radiation therapy, History of thyroid cancer (06/2008), Hyperlipidemia, Hypertension, Hypothyroidism, postsurgical (06/2008), OA (osteoarthritis), Personal history of radiation therapy, Type 2 diabetes mellitus treated with insulin (HCC), and Wears dentures.   She has a past surgical history  that includes Total thyroidectomy (Bilateral, 06/30/2008); Cesarean section (1985); Knee arthroscopy (Left, 10/22/2016); Knee arthroscopy with medial menisectomy (10/22/2016); Knee arthroscopy with subchondroplasty (Left, 10/22/2016); Hip pinning, cannulated (Left, 03/10/2018); Colonoscopy with propofol (11/10/2021); Anal intraepithelial neoplasia excision (N/A, 11/30/2021); High resolution anoscopy (N/A, 11/30/2021); and Breast lumpectomy with radioactive seed and sentinel lymph node biopsy (Left, 03/22/2022).   Her family history includes Arthritis in her father; Heart disease in her mother.She reports that she quit smoking about 23 years ago. Her smoking use included cigarettes. She started smoking about 28 years ago. She has a 2.5 pack-year smoking history. She has never used smokeless tobacco. She reports that she does not drink alcohol and does not use drugs.  Current Outpatient Medications on File Prior to Visit  Medication Sig Dispense Refill   Accu-Chek FastClix Lancets MISC Use to check Blood Sugars twice daily. Dx E11.8 204 each 3   acetaminophen (TYLENOL) 325 MG tablet Take 325-650 mg by mouth every 6 (six) hours as needed (for pain or headaches).     Alcohol Swabs (B-D SINGLE USE SWABS REGULAR) PADS Use to check Blood Sugars twice daily. Dx E11.8 200 each 3   alendronate (FOSAMAX) 70 MG tablet TAKE 1 TABLET EVERY 7 DAYS WITH A FULL GLASS OF WATER ON AN EMPTY STOMACH. DO NOT LAY DOWN FOR AT LEAST 2 HOURS 12 tablet 3   anastrozole (ARIMIDEX) 1 MG tablet TAKE 1 TABLET EVERY DAY 90 tablet 3   Blood Glucose Calibration (ACCU-CHEK AVIVA) SOLN Use to check Blood Sugars twice daily. Dx E11.8 1 each 3   Blood Glucose Monitoring Suppl (ACCU-CHEK AVIVA PLUS) w/Device KIT Use to check Blood Sugars twice daily. Dx E11.8 1 kit 0   Cholecalciferol (  VITAMIN D) 2000 UNITS tablet Take 1 tablet (2,000 Units total) by mouth daily. (Patient taking differently: Take 2,000 Units by mouth every evening.) 30 tablet 11    diclofenac sodium (VOLTAREN) 1 % GEL Apply 2-4 g topically See admin instructions. Apply 2-4 grams to each hand up to 4 times a day as needed for arthritis in the fingers (Patient taking differently: Apply 2-4 g topically See admin instructions. Apply 2-4 grams to each hand up to 4 times a day as needed for arthritis in the fingers) 100 g 11   docusate sodium (COLACE) 100 MG capsule Take 1 capsule (100 mg total) by mouth 2 (two) times daily. 180 capsule 3   Dulaglutide (TRULICITY) 4.5 MG/0.5ML SOPN Inject 4.5 mg as directed once a week. Monday's 6.5 mL 3   glucose blood (ACCU-CHEK AVIVA PLUS) test strip check Blood Sugars twice daily. Dx E11.8 200 strip 3   insulin glargine (LANTUS SOLOSTAR) 100 UNIT/ML Solostar Pen INJECT 110 UNITS INTO THE SKIN DAILY AFTER BREAKFAST. 105 mL 0   Insulin Pen Needle (DROPLET PEN NEEDLES) 31G X 8 MM MISC USE AS DIRECTED EVERY DAY Dx E11.8 100 each 3   levothyroxine (SYNTHROID) 100 MCG tablet Take 1 tablet (100 mcg total) by mouth daily before breakfast. 90 tablet 1   metFORMIN (GLUCOPHAGE) 1000 MG tablet Take 1 tablet (1,000 mg total) by mouth 2 (two) times daily with a meal. 180 tablet 3   No current facility-administered medications on file prior to visit.    ROS Review of Systems  Constitutional: Negative.   HENT:  Negative for congestion.   Eyes:  Negative for visual disturbance.  Respiratory:  Negative for shortness of breath.   Cardiovascular:  Negative for chest pain.  Gastrointestinal:  Positive for nausea. Negative for abdominal pain, constipation, diarrhea and vomiting.  Genitourinary:  Negative for difficulty urinating.  Musculoskeletal:  Negative for arthralgias and myalgias.  Neurological:  Negative for headaches.  Psychiatric/Behavioral:  Negative for sleep disturbance.     Objective:  BP 99/60   Pulse 96   Temp 97.9 F (36.6 C)   Ht 4\' 11"  (1.499 m)   Wt 164 lb (74.4 kg)   LMP 03/02/2004   SpO2 97%   BMI 33.12 kg/m   BP Readings  from Last 3 Encounters:  10/23/23 99/60  07/25/23 (!) 116/59  04/25/23 114/60    Wt Readings from Last 3 Encounters:  10/23/23 164 lb (74.4 kg)  10/09/23 168 lb (76.2 kg)  09/06/23 168 lb 4 oz (76.3 kg)     Physical Exam Constitutional:      General: She is not in acute distress.    Appearance: She is well-developed.  HENT:     Head: Normocephalic and atraumatic.  Eyes:     Conjunctiva/sclera: Conjunctivae normal.     Pupils: Pupils are equal, round, and reactive to light.  Neck:     Thyroid: No thyromegaly.  Cardiovascular:     Rate and Rhythm: Normal rate and regular rhythm.     Heart sounds: Normal heart sounds. No murmur heard. Pulmonary:     Effort: Pulmonary effort is normal. No respiratory distress.     Breath sounds: Normal breath sounds. No wheezing or rales.  Abdominal:     General: Bowel sounds are normal. There is no distension.     Palpations: Abdomen is soft.     Tenderness: There is no abdominal tenderness.  Musculoskeletal:        General: Normal range of motion.  Cervical back: Normal range of motion and neck supple.  Lymphadenopathy:     Cervical: No cervical adenopathy.  Skin:    General: Skin is warm and dry.  Neurological:     Mental Status: She is alert and oriented to person, place, and time.  Psychiatric:        Behavior: Behavior normal.        Thought Content: Thought content normal.        Judgment: Judgment normal.     Diabetic Foot Exam - Simple   Simple Foot Form Diabetic Foot exam was performed with the following findings: Yes 10/23/2023 11:00 AM  Visual Inspection No deformities, no ulcerations, no other skin breakdown bilaterally: Yes Sensation Testing Intact to touch and monofilament testing bilaterally: Yes Pulse Check Posterior Tibialis and Dorsalis pulse intact bilaterally: Yes Comments     Lab Results  Component Value Date   HGBA1C 8.2 (H) 10/23/2023   HGBA1C 6.8 (H) 07/25/2023   HGBA1C 7.1 (H) 04/25/2023     Assessment & Plan:   Morgan Schmidt was seen today for medical management of chronic issues.  Diagnoses and all orders for this visit:  Essential hypertension -     CBC with Differential/Platelet -     CMP14+EGFR -     Lipid panel  Mixed diabetic hyperlipidemia associated with type 2 diabetes mellitus (HCC) -     Bayer DCA Hb A1c Waived -     rosuvastatin (CRESTOR) 20 MG tablet; TAKE 1 TABLET EVERY DAY FOR CHOLESTEROL  Other specified hypothyroidism -     TSH + free T4  Controlled type 2 diabetes mellitus with complication, with long-term current use of insulin (HCC) -     Bayer DCA Hb A1c Waived  Controlled diabetes mellitus type 2 with complications, unspecified whether long term insulin use (HCC) -     Bayer DCA Hb A1c Waived -     Lipid panel  Other orders -     olmesartan-hydrochlorothiazide (BENICAR HCT) 40-25 MG tablet; Take 1 tablet by mouth daily. -     ondansetron (ZOFRAN-ODT) 8 MG disintegrating tablet; Take 1 tablet (8 mg total) by mouth every 6 (six) hours as needed for nausea or vomiting.   I have changed Morgan Schmidt's olmesartan-hydrochlorothiazide. I am also having her start on ondansetron. Additionally, I am having her maintain her Vitamin D, acetaminophen, diclofenac sodium, Accu-Chek Aviva Plus, Accu-Chek Aviva, B-D SINGLE USE SWABS REGULAR, docusate sodium, Accu-Chek Aviva Plus, Trulicity, metFORMIN, Accu-Chek FastClix Lancets, Droplet Pen Needles, levothyroxine, alendronate, Lantus SoloStar, anastrozole, and rosuvastatin.  Meds ordered this encounter  Medications   rosuvastatin (CRESTOR) 20 MG tablet    Sig: TAKE 1 TABLET EVERY DAY FOR CHOLESTEROL    Dispense:  90 tablet    Refill:  3   olmesartan-hydrochlorothiazide (BENICAR HCT) 40-25 MG tablet    Sig: Take 1 tablet by mouth daily.    Dispense:  90 tablet    Refill:  0   ondansetron (ZOFRAN-ODT) 8 MG disintegrating tablet    Sig: Take 1 tablet (8 mg total) by mouth every 6 (six) hours as needed  for nausea or vomiting.    Dispense:  20 tablet    Refill:  1     Follow-up: No follow-ups on file.  Mechele Claude, M.D.

## 2023-10-24 ENCOUNTER — Other Ambulatory Visit: Payer: Self-pay | Admitting: Family Medicine

## 2023-10-24 DIAGNOSIS — E038 Other specified hypothyroidism: Secondary | ICD-10-CM

## 2023-10-24 LAB — CBC WITH DIFFERENTIAL/PLATELET
Basophils Absolute: 0.1 10*3/uL (ref 0.0–0.2)
Basos: 1 %
EOS (ABSOLUTE): 0.3 10*3/uL (ref 0.0–0.4)
Eos: 5 %
Hematocrit: 37.3 % (ref 34.0–46.6)
Hemoglobin: 12 g/dL (ref 11.1–15.9)
Immature Grans (Abs): 0 10*3/uL (ref 0.0–0.1)
Immature Granulocytes: 0 %
Lymphocytes Absolute: 1.2 10*3/uL (ref 0.7–3.1)
Lymphs: 20 %
MCH: 27 pg (ref 26.6–33.0)
MCHC: 32.2 g/dL (ref 31.5–35.7)
MCV: 84 fL (ref 79–97)
Monocytes Absolute: 0.3 10*3/uL (ref 0.1–0.9)
Monocytes: 5 %
Neutrophils Absolute: 4.2 10*3/uL (ref 1.4–7.0)
Neutrophils: 69 %
Platelets: 198 10*3/uL (ref 150–450)
RBC: 4.45 x10E6/uL (ref 3.77–5.28)
RDW: 13.7 % (ref 11.7–15.4)
WBC: 6.1 10*3/uL (ref 3.4–10.8)

## 2023-10-24 LAB — CMP14+EGFR
ALT: 12 IU/L (ref 0–32)
AST: 16 IU/L (ref 0–40)
Albumin: 4.3 g/dL (ref 3.9–4.9)
Alkaline Phosphatase: 67 IU/L (ref 44–121)
BUN/Creatinine Ratio: 23 (ref 12–28)
BUN: 26 mg/dL (ref 8–27)
Bilirubin Total: 0.6 mg/dL (ref 0.0–1.2)
CO2: 22 mmol/L (ref 20–29)
Calcium: 8.2 mg/dL — ABNORMAL LOW (ref 8.7–10.3)
Chloride: 101 mmol/L (ref 96–106)
Creatinine, Ser: 1.11 mg/dL — ABNORMAL HIGH (ref 0.57–1.00)
Globulin, Total: 1.9 g/dL (ref 1.5–4.5)
Glucose: 176 mg/dL — ABNORMAL HIGH (ref 70–99)
Potassium: 4.6 mmol/L (ref 3.5–5.2)
Sodium: 136 mmol/L (ref 134–144)
Total Protein: 6.2 g/dL (ref 6.0–8.5)
eGFR: 53 mL/min/{1.73_m2} — ABNORMAL LOW (ref >59)

## 2023-10-24 LAB — LIPID PANEL
Cholesterol, Total: 108 mg/dL (ref 100–199)
HDL: 40 mg/dL (ref >39)
LDL CALC COMMENT:: 2.7 ratio (ref 0.0–4.4)
LDL Chol Calc (NIH): 39 mg/dL (ref 0–99)
Triglycerides: 177 mg/dL — ABNORMAL HIGH (ref 0–149)
VLDL Cholesterol Cal: 29 mg/dL (ref 5–40)

## 2023-10-24 LAB — TSH+FREE T4
Free T4: 2.02 ng/dL — ABNORMAL HIGH (ref 0.82–1.77)
TSH: 0.232 u[IU]/mL — ABNORMAL LOW (ref 0.450–4.500)

## 2023-10-24 MED ORDER — LEVOTHYROXINE SODIUM 88 MCG PO TABS: 88.0000 ug | ORAL_TABLET | Freq: Every day | ORAL | 1 refills | Status: DC

## 2023-10-30 ENCOUNTER — Ambulatory Visit: Payer: Self-pay | Admitting: General Surgery

## 2023-10-30 DIAGNOSIS — D013 Carcinoma in situ of anus and anal canal: Secondary | ICD-10-CM | POA: Diagnosis not present

## 2023-10-30 NOTE — H&P (Signed)
 PROVIDER:  [name], MD  MRN: [id number] DOB: 04-18-1951 DATE OF ENCOUNTER: 10/30/2023 Interval History:   73 year old female who underwent a colonoscopy where multiple polyps were resected.  Patient was noted to have an anal canal lesion with nodularity.  Biopsy of this showed AIN grade 3.    I recommended high resolution anoscopy.  This was completed in April 2023.  An external lesion was noted to be abnormal as well as an anterior anal canal lesion.  Both of these were excised.  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  Pathology was discussed at tumor conference and surveillance was recommended for follow-up.  Her last appointment was normal.  She is here for her 58-month evaluation.  Past Medical History:  Diagnosis Date   AIN (anal intraepithelial neoplasia) anal canal    Cancer (HCC) 03/15/2022   breast   History of adenomatous polyp of colon    History of radiation therapy    Left breast- 05/01/22-05/22/22- Dr. Antony Blackbird   History of thyroid cancer 06/2008   s/p  total bilateral thyroidectomy for symptomatic multinodular goiter;   06-30-2008 s/p total thyroidectomy -- papillary thyroid cancer  and treated w/ RAI post surgery 09-29-2008   (last scan negative for recurrence in epic 03/ 2011)   Hyperlipidemia    Hypertension    Hypothyroidism, postsurgical 06/2008   and had RAI post surgery 02/ 2010;   followed by pcp   OA (osteoarthritis)    Personal history of radiation therapy    Type 2 diabetes mellitus treated with insulin (HCC)    followed by pcp   (11-23-2021  per pt checks daily in am and at times in afternoon,  fasting sugar--- 95--110)   Wears dentures    upper   Past Surgical History:  Procedure Laterality Date   ANAL INTRAEPITHELIAL NEOPLASIA EXCISION N/A 11/30/2021   Procedure: EXCISIONAL BIOPSY ANAL LESIONS;  Surgeon: Romie Levee, MD;  Location: [facility];  Service: General;  Laterality: N/A;   BREAST  LUMPECTOMY WITH RADIOACTIVE SEED AND SENTINEL LYMPH NODE BIOPSY Left 03/22/2022   Procedure: LEFT BREAST LUMPECTOMY WITH RADIOACTIVE SEED AND SENTINEL LYMPH NODE BIOPSY;  Surgeon: Griselda Miner, MD;  Location: [facility];  Service: General;  Laterality: Left;  GEN & PEC BLOCK   CESAREAN SECTION  1985   W/  BILATERAL TUBAL LIGATION   COLONOSCOPY WITH PROPOFOL  11/10/2021   by Dr.Pyrtle   HIGH RESOLUTION ANOSCOPY N/A 11/30/2021   Procedure: HIGH RESOLUTION ANOSCOPY;  Surgeon: Romie Levee, MD;  Location: [facility];  Service: General;  Laterality: N/A;   HIP PINNING,CANNULATED Left 03/10/2018   Procedure: CANNULATED HIP PINNING;  Surgeon: Durene Romans, MD;  Location: [facility] OR;  Service: Orthopedics;  Laterality: Left;   KNEE ARTHROSCOPY Left 10/22/2016   Procedure: ARTHROSCOPY LEFT KNEE WITH DEBRIDEMENT;  Surgeon: Durene Romans, MD;  Location: [facility];  Service: Orthopedics;  Laterality: Left;   KNEE ARTHROSCOPY WITH MEDIAL MENISECTOMY  10/22/2016   Procedure: KNEE ARTHROSCOPY WITH MEDIAL MENISECTOMY;  Surgeon: Durene Romans, MD;  Location: [facility];  Service: Orthopedics;;   KNEE ARTHROSCOPY WITH SUBCHONDROPLASTY Left 10/22/2016   Procedure: KNEE ARTHROSCOPY WITH MEDIAL CHONDROPLASTY;  Surgeon: Durene Romans, MD;  Location: [facility];  Service: Orthopedics;  Laterality: Left;   TOTAL THYROIDECTOMY Bilateral 06/30/2008   @[facility]  by dr Johna Sheriff   Social History   Socioeconomic History   Marital status: Significant Other  Spouse name: Not on file   Number of children: 3   Years of education: 65   Highest education level: Some college, no degree  Occupational History   Occupation: retired  Tobacco Use   Smoking status: Former    Current packs/day: 0.00    Average packs/day: 0.5 packs/day for 5.0 years (2.5 ttl pk-yrs)    Types: Cigarettes    Start date: 19    Quit date: 2002    Years since quitting: 23.2    Smokeless tobacco: Never  Vaping Use   Vaping status: Never Used  Substance and Sexual Activity   Alcohol use: No   Drug use: Never   Sexual activity: Not on file  Other Topics Concern   Not on file  Social History Narrative   Not on file   Social Drivers of Health   Financial Resource Strain: Low Risk  (10/23/2023)   Overall Financial Resource Strain (CARDIA)    Difficulty of Paying Living Expenses: Not hard at all  Food Insecurity: No Food Insecurity (10/23/2023)   Hunger Vital Sign    Worried About Running Out of Food in the Last Year: Never true    Ran Out of Food in the Last Year: Never true  Transportation Needs: No Transportation Needs (10/23/2023)   PRAPARE - Administrator, Civil Service (Medical): No    Lack of Transportation (Non-Medical): No  Physical Activity: Insufficiently Active (10/23/2023)   Exercise Vital Sign    Days of Exercise per Week: 4 days    Minutes of Exercise per Session: 20 min  Stress: No Stress Concern Present (10/23/2023)   [employer] of Occupational Health - Occupational Stress Questionnaire    Feeling of Stress : Not at all  Social Connections: Moderately Integrated (10/23/2023)   Social Connection and Isolation Panel [NHANES]    Frequency of Communication with Friends and Family: More than three times a week    Frequency of Social Gatherings with Friends and Family: More than three times a week    Attends Religious Services: More than 4 times per year    Active Member of [employer] or Organizations: No    Attends Banker Meetings: Never    Marital Status: Living with partner  Recent Concern: Social Connections - Moderately Isolated (10/09/2023)   Social Connection and Isolation Panel [NHANES]    Frequency of Communication with Friends and Family: More than three times a week    Frequency of Social Gatherings with Friends and Family: Three times a week    Attends Religious Services: 1 to 4 times per year    Active Member of  Clubs or Organizations: No    Attends Banker Meetings: Never    Marital Status: Divorced  Catering manager Violence: Not At Risk (10/09/2023)   Humiliation, Afraid, Rape, and Kick questionnaire    Fear of Current or Ex-Partner: No    Emotionally Abused: No    Physically Abused: No    Sexually Abused: No   Family History  Problem Relation Age of Onset   Heart disease Mother    Arthritis Father    Colon cancer Neg Hx    Esophageal cancer Neg Hx    Rectal cancer Neg Hx    Stomach cancer Neg Hx    Breast cancer Neg Hx    Colon polyps Neg Hx     Current Outpatient Medications:    Accu-Chek FastClix Lancets MISC, Use to check Blood Sugars twice daily. Dx E11.8,  Disp: 204 each, Rfl: 3   acetaminophen (TYLENOL) 325 MG tablet, Take 325-650 mg by mouth every 6 (six) hours as needed (for pain or headaches)., Disp: , Rfl:    Alcohol Swabs (B-D SINGLE USE SWABS REGULAR) PADS, Use to check Blood Sugars twice daily. Dx E11.8, Disp: 200 each, Rfl: 3   alendronate (FOSAMAX) 70 MG tablet, TAKE 1 TABLET EVERY 7 DAYS WITH A FULL GLASS OF WATER ON AN EMPTY STOMACH. DO NOT LAY DOWN FOR AT LEAST 2 HOURS, Disp: 12 tablet, Rfl: 3   anastrozole (ARIMIDEX) 1 MG tablet, TAKE 1 TABLET EVERY DAY, Disp: 90 tablet, Rfl: 3   Blood Glucose Calibration (ACCU-CHEK AVIVA) SOLN, Use to check Blood Sugars twice daily. Dx E11.8, Disp: 1 each, Rfl: 3   Blood Glucose Monitoring Suppl (ACCU-CHEK AVIVA PLUS) w/Device KIT, Use to check Blood Sugars twice daily. Dx E11.8, Disp: 1 kit, Rfl: 0   Cholecalciferol (VITAMIN D) 2000 UNITS tablet, Take 1 tablet (2,000 Units total) by mouth daily. (Patient taking differently: Take 2,000 Units by mouth every evening.), Disp: 30 tablet, Rfl: 11   diclofenac sodium (VOLTAREN) 1 % GEL, Apply 2-4 g topically See admin instructions. Apply 2-4 grams to each hand up to 4 times a day as needed for arthritis in the fingers (Patient taking differently: Apply 2-4 g topically See admin  instructions. Apply 2-4 grams to each hand up to 4 times a day as needed for arthritis in the fingers), Disp: 100 g, Rfl: 11   docusate sodium (COLACE) 100 MG capsule, Take 1 capsule (100 mg total) by mouth 2 (two) times daily., Disp: 180 capsule, Rfl: 3   Dulaglutide (TRULICITY) 4.5 MG/0.5ML SOPN, Inject 4.5 mg as directed once a week. Monday's, Disp: 6.5 mL, Rfl: 3   glucose blood (ACCU-CHEK AVIVA PLUS) test strip, check Blood Sugars twice daily. Dx E11.8, Disp: 200 strip, Rfl: 3   insulin glargine (LANTUS SOLOSTAR) 100 UNIT/ML Solostar Pen, INJECT 110 UNITS INTO THE SKIN DAILY AFTER BREAKFAST., Disp: 105 mL, Rfl: 0   Insulin Pen Needle (DROPLET PEN NEEDLES) 31G X 8 MM MISC, USE AS DIRECTED EVERY DAY Dx E11.8, Disp: 100 each, Rfl: 3   levothyroxine (SYNTHROID) 88 MCG tablet, Take 1 tablet (88 mcg total) by mouth daily before breakfast., Disp: 90 tablet, Rfl: 1   metFORMIN (GLUCOPHAGE) 1000 MG tablet, Take 1 tablet (1,000 mg total) by mouth 2 (two) times daily with a meal., Disp: 180 tablet, Rfl: 3   olmesartan-hydrochlorothiazide (BENICAR HCT) 40-25 MG tablet, Take 1 tablet by mouth daily., Disp: 90 tablet, Rfl: 0   ondansetron (ZOFRAN-ODT) 8 MG disintegrating tablet, Take 1 tablet (8 mg total) by mouth every 6 (six) hours as needed for nausea or vomiting., Disp: 20 tablet, Rfl: 1   rosuvastatin (CRESTOR) 20 MG tablet, TAKE 1 TABLET EVERY DAY FOR CHOLESTEROL, Disp: 90 tablet, Rfl: 3 Allergies  Allergen Reactions   Lipitor [Atorvastatin Calcium] Swelling    Throat swelling   Ace Inhibitors Cough   Review of Systems - Negative except as stated above    Physical Examination:   Gen: NAD Rectal: See below  Pathology 4/23: Anterior anal canal lesion showed superficially invasive moderately differentiated squamous cell carcinoma involving the anorectal mucosa with high-grade squamous cell dysplasia.  External perianal lesion shows squamous cell carcinoma in situ with focal extension into  underlying adnexal structures.  Lateral margin was positive for low-grade dysplasia but no high-grade dysplasia or carcinoma   Procedure: Anoscopy Surgeon: Maisie Fus After the risks  and benefits were explained, written consent was obtained for above procedure.  A medical assistant chaperone was present thoroughout the entire procedure.  Anesthesia: none Diagnosis: h/o AIN III Findings: nodularity and friability at anterior midline internally at the dentatel LA perianal scar WNL  Assessment and Plan:   Morgan Schmidt is a 73 y.o. female who underwent high resolution and resection of 2 anal masses in April 2023.  Pathology showed squamous cell carcinoma in situ.     Diagnoses and all orders for this visit:  Anal intraepithelial neoplasia III (AIN III)   73 year old female with history of AIN grade 3 who presents to the office for surveillance.  On exam today she has an area of nodularity within the anterior midline just proximal to the dentate line.  I have recommended anal exam under anesthesia with biopsy and possible ablation or excision.  We have discussed this in detail.  Risk include bleeding, pain and recurrence.   The plan was discussed in detail with the patient today, who expressed understanding.  The patient has my contact information, and understands to call me with any additional questions or concerns in the interval.  I would be happy to see the patient back sooner if the need arises.   Vanita Panda, MD Colon and Rectal Surgery [facility]

## 2023-10-30 NOTE — H&P (View-Only) (Signed)
 PROVIDER:  [name], MD  MRN: [id number] DOB: 04-18-1951 DATE OF ENCOUNTER: 10/30/2023 Interval History:   73 year old female who underwent a colonoscopy where multiple polyps were resected.  Patient was noted to have an anal canal lesion with nodularity.  Biopsy of this showed AIN grade 3.    I recommended high resolution anoscopy.  This was completed in April 2023.  An external lesion was noted to be abnormal as well as an anterior anal canal lesion.  Both of these were excised.  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  Pathology was discussed at tumor conference and surveillance was recommended for follow-up.  Her last appointment was normal.  She is here for her 58-month evaluation.  Past Medical History:  Diagnosis Date   AIN (anal intraepithelial neoplasia) anal canal    Cancer (HCC) 03/15/2022   breast   History of adenomatous polyp of colon    History of radiation therapy    Left breast- 05/01/22-05/22/22- Dr. Antony Blackbird   History of thyroid cancer 06/2008   s/p  total bilateral thyroidectomy for symptomatic multinodular goiter;   06-30-2008 s/p total thyroidectomy -- papillary thyroid cancer  and treated w/ RAI post surgery 09-29-2008   (last scan negative for recurrence in epic 03/ 2011)   Hyperlipidemia    Hypertension    Hypothyroidism, postsurgical 06/2008   and had RAI post surgery 02/ 2010;   followed by pcp   OA (osteoarthritis)    Personal history of radiation therapy    Type 2 diabetes mellitus treated with insulin (HCC)    followed by pcp   (11-23-2021  per pt checks daily in am and at times in afternoon,  fasting sugar--- 95--110)   Wears dentures    upper   Past Surgical History:  Procedure Laterality Date   ANAL INTRAEPITHELIAL NEOPLASIA EXCISION N/A 11/30/2021   Procedure: EXCISIONAL BIOPSY ANAL LESIONS;  Surgeon: Romie Levee, MD;  Location: [facility];  Service: General;  Laterality: N/A;   BREAST  LUMPECTOMY WITH RADIOACTIVE SEED AND SENTINEL LYMPH NODE BIOPSY Left 03/22/2022   Procedure: LEFT BREAST LUMPECTOMY WITH RADIOACTIVE SEED AND SENTINEL LYMPH NODE BIOPSY;  Surgeon: Griselda Miner, MD;  Location: [facility];  Service: General;  Laterality: Left;  GEN & PEC BLOCK   CESAREAN SECTION  1985   W/  BILATERAL TUBAL LIGATION   COLONOSCOPY WITH PROPOFOL  11/10/2021   by Dr.Pyrtle   HIGH RESOLUTION ANOSCOPY N/A 11/30/2021   Procedure: HIGH RESOLUTION ANOSCOPY;  Surgeon: Romie Levee, MD;  Location: [facility];  Service: General;  Laterality: N/A;   HIP PINNING,CANNULATED Left 03/10/2018   Procedure: CANNULATED HIP PINNING;  Surgeon: Durene Romans, MD;  Location: [facility] OR;  Service: Orthopedics;  Laterality: Left;   KNEE ARTHROSCOPY Left 10/22/2016   Procedure: ARTHROSCOPY LEFT KNEE WITH DEBRIDEMENT;  Surgeon: Durene Romans, MD;  Location: [facility];  Service: Orthopedics;  Laterality: Left;   KNEE ARTHROSCOPY WITH MEDIAL MENISECTOMY  10/22/2016   Procedure: KNEE ARTHROSCOPY WITH MEDIAL MENISECTOMY;  Surgeon: Durene Romans, MD;  Location: [facility];  Service: Orthopedics;;   KNEE ARTHROSCOPY WITH SUBCHONDROPLASTY Left 10/22/2016   Procedure: KNEE ARTHROSCOPY WITH MEDIAL CHONDROPLASTY;  Surgeon: Durene Romans, MD;  Location: [facility];  Service: Orthopedics;  Laterality: Left;   TOTAL THYROIDECTOMY Bilateral 06/30/2008   @[facility]  by dr Johna Sheriff   Social History   Socioeconomic History   Marital status: Significant Other  Spouse name: Not on file   Number of children: 3   Years of education: 65   Highest education level: Some college, no degree  Occupational History   Occupation: retired  Tobacco Use   Smoking status: Former    Current packs/day: 0.00    Average packs/day: 0.5 packs/day for 5.0 years (2.5 ttl pk-yrs)    Types: Cigarettes    Start date: 19    Quit date: 2002    Years since quitting: 23.2    Smokeless tobacco: Never  Vaping Use   Vaping status: Never Used  Substance and Sexual Activity   Alcohol use: No   Drug use: Never   Sexual activity: Not on file  Other Topics Concern   Not on file  Social History Narrative   Not on file   Social Drivers of Health   Financial Resource Strain: Low Risk  (10/23/2023)   Overall Financial Resource Strain (CARDIA)    Difficulty of Paying Living Expenses: Not hard at all  Food Insecurity: No Food Insecurity (10/23/2023)   Hunger Vital Sign    Worried About Running Out of Food in the Last Year: Never true    Ran Out of Food in the Last Year: Never true  Transportation Needs: No Transportation Needs (10/23/2023)   PRAPARE - Administrator, Civil Service (Medical): No    Lack of Transportation (Non-Medical): No  Physical Activity: Insufficiently Active (10/23/2023)   Exercise Vital Sign    Days of Exercise per Week: 4 days    Minutes of Exercise per Session: 20 min  Stress: No Stress Concern Present (10/23/2023)   [employer] of Occupational Health - Occupational Stress Questionnaire    Feeling of Stress : Not at all  Social Connections: Moderately Integrated (10/23/2023)   Social Connection and Isolation Panel [NHANES]    Frequency of Communication with Friends and Family: More than three times a week    Frequency of Social Gatherings with Friends and Family: More than three times a week    Attends Religious Services: More than 4 times per year    Active Member of [employer] or Organizations: No    Attends Banker Meetings: Never    Marital Status: Living with partner  Recent Concern: Social Connections - Moderately Isolated (10/09/2023)   Social Connection and Isolation Panel [NHANES]    Frequency of Communication with Friends and Family: More than three times a week    Frequency of Social Gatherings with Friends and Family: Three times a week    Attends Religious Services: 1 to 4 times per year    Active Member of  Clubs or Organizations: No    Attends Banker Meetings: Never    Marital Status: Divorced  Catering manager Violence: Not At Risk (10/09/2023)   Humiliation, Afraid, Rape, and Kick questionnaire    Fear of Current or Ex-Partner: No    Emotionally Abused: No    Physically Abused: No    Sexually Abused: No   Family History  Problem Relation Age of Onset   Heart disease Mother    Arthritis Father    Colon cancer Neg Hx    Esophageal cancer Neg Hx    Rectal cancer Neg Hx    Stomach cancer Neg Hx    Breast cancer Neg Hx    Colon polyps Neg Hx     Current Outpatient Medications:    Accu-Chek FastClix Lancets MISC, Use to check Blood Sugars twice daily. Dx E11.8,  Disp: 204 each, Rfl: 3   acetaminophen (TYLENOL) 325 MG tablet, Take 325-650 mg by mouth every 6 (six) hours as needed (for pain or headaches)., Disp: , Rfl:    Alcohol Swabs (B-D SINGLE USE SWABS REGULAR) PADS, Use to check Blood Sugars twice daily. Dx E11.8, Disp: 200 each, Rfl: 3   alendronate (FOSAMAX) 70 MG tablet, TAKE 1 TABLET EVERY 7 DAYS WITH A FULL GLASS OF WATER ON AN EMPTY STOMACH. DO NOT LAY DOWN FOR AT LEAST 2 HOURS, Disp: 12 tablet, Rfl: 3   anastrozole (ARIMIDEX) 1 MG tablet, TAKE 1 TABLET EVERY DAY, Disp: 90 tablet, Rfl: 3   Blood Glucose Calibration (ACCU-CHEK AVIVA) SOLN, Use to check Blood Sugars twice daily. Dx E11.8, Disp: 1 each, Rfl: 3   Blood Glucose Monitoring Suppl (ACCU-CHEK AVIVA PLUS) w/Device KIT, Use to check Blood Sugars twice daily. Dx E11.8, Disp: 1 kit, Rfl: 0   Cholecalciferol (VITAMIN D) 2000 UNITS tablet, Take 1 tablet (2,000 Units total) by mouth daily. (Patient taking differently: Take 2,000 Units by mouth every evening.), Disp: 30 tablet, Rfl: 11   diclofenac sodium (VOLTAREN) 1 % GEL, Apply 2-4 g topically See admin instructions. Apply 2-4 grams to each hand up to 4 times a day as needed for arthritis in the fingers (Patient taking differently: Apply 2-4 g topically See admin  instructions. Apply 2-4 grams to each hand up to 4 times a day as needed for arthritis in the fingers), Disp: 100 g, Rfl: 11   docusate sodium (COLACE) 100 MG capsule, Take 1 capsule (100 mg total) by mouth 2 (two) times daily., Disp: 180 capsule, Rfl: 3   Dulaglutide (TRULICITY) 4.5 MG/0.5ML SOPN, Inject 4.5 mg as directed once a week. Monday's, Disp: 6.5 mL, Rfl: 3   glucose blood (ACCU-CHEK AVIVA PLUS) test strip, check Blood Sugars twice daily. Dx E11.8, Disp: 200 strip, Rfl: 3   insulin glargine (LANTUS SOLOSTAR) 100 UNIT/ML Solostar Pen, INJECT 110 UNITS INTO THE SKIN DAILY AFTER BREAKFAST., Disp: 105 mL, Rfl: 0   Insulin Pen Needle (DROPLET PEN NEEDLES) 31G X 8 MM MISC, USE AS DIRECTED EVERY DAY Dx E11.8, Disp: 100 each, Rfl: 3   levothyroxine (SYNTHROID) 88 MCG tablet, Take 1 tablet (88 mcg total) by mouth daily before breakfast., Disp: 90 tablet, Rfl: 1   metFORMIN (GLUCOPHAGE) 1000 MG tablet, Take 1 tablet (1,000 mg total) by mouth 2 (two) times daily with a meal., Disp: 180 tablet, Rfl: 3   olmesartan-hydrochlorothiazide (BENICAR HCT) 40-25 MG tablet, Take 1 tablet by mouth daily., Disp: 90 tablet, Rfl: 0   ondansetron (ZOFRAN-ODT) 8 MG disintegrating tablet, Take 1 tablet (8 mg total) by mouth every 6 (six) hours as needed for nausea or vomiting., Disp: 20 tablet, Rfl: 1   rosuvastatin (CRESTOR) 20 MG tablet, TAKE 1 TABLET EVERY DAY FOR CHOLESTEROL, Disp: 90 tablet, Rfl: 3 Allergies  Allergen Reactions   Lipitor [Atorvastatin Calcium] Swelling    Throat swelling   Ace Inhibitors Cough   Review of Systems - Negative except as stated above    Physical Examination:   Gen: NAD Rectal: See below  Pathology 4/23: Anterior anal canal lesion showed superficially invasive moderately differentiated squamous cell carcinoma involving the anorectal mucosa with high-grade squamous cell dysplasia.  External perianal lesion shows squamous cell carcinoma in situ with focal extension into  underlying adnexal structures.  Lateral margin was positive for low-grade dysplasia but no high-grade dysplasia or carcinoma   Procedure: Anoscopy Surgeon: Maisie Fus After the risks  and benefits were explained, written consent was obtained for above procedure.  A medical assistant chaperone was present thoroughout the entire procedure.  Anesthesia: none Diagnosis: h/o AIN III Findings: nodularity and friability at anterior midline internally at the dentatel LA perianal scar WNL  Assessment and Plan:   Morgan Schmidt is a 73 y.o. female who underwent high resolution and resection of 2 anal masses in April 2023.  Pathology showed squamous cell carcinoma in situ.     Diagnoses and all orders for this visit:  Anal intraepithelial neoplasia III (AIN III)   73 year old female with history of AIN grade 3 who presents to the office for surveillance.  On exam today she has an area of nodularity within the anterior midline just proximal to the dentate line.  I have recommended anal exam under anesthesia with biopsy and possible ablation or excision.  We have discussed this in detail.  Risk include bleeding, pain and recurrence.   The plan was discussed in detail with the patient today, who expressed understanding.  The patient has my contact information, and understands to call me with any additional questions or concerns in the interval.  I would be happy to see the patient back sooner if the need arises.   Vanita Panda, MD Colon and Rectal Surgery [facility]

## 2023-11-12 NOTE — Patient Instructions (Signed)
 DUE TO COVID-19 ONLY TWO VISITORS  (aged 71 and older)  ARE ALLOWED TO COME WITH YOU AND STAY IN THE WAITING ROOM ONLY DURING PRE OP AND PROCEDURE.   **NO VISITORS ARE ALLOWED IN THE SHORT STAY AREA OR RECOVERY ROOM!!**  IF YOU WILL BE ADMITTED INTO THE HOSPITAL YOU ARE ALLOWED ONLY FOUR SUPPORT PEOPLE DURING VISITATION HOURS ONLY (7 AM -8PM)   The support person(s) must pass our screening, gel in and out, and wear a mask at all times, including in the patient's room. Patients must also wear a mask when staff or their support person are in the room. Visitors GUEST BADGE MUST BE WORN VISIBLY  One adult visitor may remain with you overnight and MUST be in the room by 8 P.M.     Your procedure is scheduled on: 11/14/23   Report to [facility] Main Entrance    Report to admitting at: 9:45 AM   Call this number if you have problems the morning of surgery [phone]   Do not eat food :After Midnight.   After Midnight you may have the following liquids until : 9:00 AM DAY OF SURGERY  Water Black Coffee (sugar ok, NO MILK/CREAM OR CREAMERS)  Tea (sugar ok, NO MILK/CREAM OR CREAMERS) regular and decaf                             Plain Jell-O (NO RED)                                           Fruit ices (not with fruit pulp, NO RED)                                     Popsicles (NO RED)                                                                  Juice: apple, WHITE grape, WHITE cranberry Sports drinks like Gatorade (NO RED)         FOLLOW BOWEL PREP AND ANY ADDITIONAL PRE OP INSTRUCTIONS YOU RECEIVED FROM YOUR SURGEON'S OFFICE!!!   Oral Hygiene is also important to reduce your risk of infection.                                    Remember - BRUSH YOUR TEETH THE MORNING OF SURGERY WITH YOUR REGULAR TOOTHPASTE  DENTURES WILL BE REMOVED PRIOR TO SURGERY PLEASE DO NOT APPLY "Poly grip" OR ADHESIVES!!!   Do NOT smoke after Midnight   Take these medicines the morning of surgery  with A SIP OF WATER: anastrozole,levothyroxine.  How to Manage Your Diabetes Before and After Surgery  Why is it important to control my blood sugar before and after surgery? Improving blood sugar levels before and after surgery helps healing and can limit problems. A way of improving blood sugar control is eating a healthy diet by:  Eating less sugar and carbohydrates  Increasing activity/exercise  Talking with your doctor about reaching your blood sugar goals High blood sugars (greater than 180 mg/dL) can raise your risk of infections and slow your recovery, so you will need to focus on controlling your diabetes during the weeks before surgery. Make sure that the doctor who takes care of your diabetes knows about your planned surgery including the date and location.  How do I manage my blood sugar before surgery? Check your blood sugar at least 4 times a day, starting 2 days before surgery, to make sure that the level is not too high or low. Check your blood sugar the morning of your surgery when you wake up and every 2 hours until you get to the Short Stay unit. If your blood sugar is less than 70 mg/dL, you will need to treat for low blood sugar: Do not take insulin. Treat a low blood sugar (less than 70 mg/dL) with  cup of clear juice (cranberry or apple), 4 glucose tablets, OR glucose gel. Recheck blood sugar in 15 minutes after treatment (to make sure it is greater than 70 mg/dL). If your blood sugar is not greater than 70 mg/dL on recheck, call [phone] for further instructions. Report your blood sugar to the short stay nurse when you get to Short Stay.  If you are admitted to the hospital after surgery: Your blood sugar will be checked by the staff and you will probably be given insulin after surgery (instead of oral diabetes medicines) to make sure you have good blood sugar levels. The goal for blood sugar control after surgery is 80-180 mg/dL.   WHAT DO I DO ABOUT MY  DIABETES MEDICATION?  THE MORNING OF SURGERY, take ONLY half of the lantus insulin dose (5.5 units).DO NOT TAKE ANY ORAL DIABETIC MEDICATIONS DAY OF YOUR SURGERY DO NOT TAKE THE FOLLOWING 7 DAYS PRIOR TO SURGERY: Ozempic, Wegovy, Rybelsus (Semaglutide), Byetta (exenatide), Bydureon (exenatide ER), Victoza, Saxenda (liraglutide), or Trulicity (dulaglutide) Mounjaro (Tirzepatide) Adlyxin (Lixisenatide), Polyethylene Glycol Loxenatide.HOLD trulicity after: 11/06/23                              You may not have any metal on your body including hair pins, jewelry, and body piercing             Do not wear make-up, lotions, powders, perfumes/cologne, or deodorant  Do not wear nail polish including gel and S&S, artificial/acrylic nails, or any other type of covering on natural nails including finger and toenails. If you have artificial nails, gel coating, etc. that needs to be removed by a nail salon please have this removed prior to surgery or surgery may need to be canceled/ delayed if the surgeon/ anesthesia feels like they are unable to be safely monitored.   Do not shave  48 hours prior to surgery.    Do not bring valuables to the hospital. [facility] IS NOT             RESPONSIBLE   FOR VALUABLES.   Contacts, glasses, or bridgework may not be worn into surgery.   Bring small overnight bag day of surgery.   DO NOT BRING YOUR HOME MEDICATIONS TO THE HOSPITAL. PHARMACY WILL DISPENSE MEDICATIONS LISTED ON YOUR MEDICATION LIST TO YOU DURING YOUR ADMISSION IN THE HOSPITAL!    Patients discharged on the day of surgery will not be allowed to drive home.  Someone NEEDS to stay with you for the  first 24 hours after anesthesia.   Special Instructions: Bring a copy of your healthcare power of attorney and living will documents         the day of surgery if you haven't scanned them before.              Please read over the following fact sheets you were given: IF YOU HAVE QUESTIONS ABOUT YOUR PRE-OP  INSTRUCTIONS PLEASE CALL [phone]    [facility] - Preparing for Surgery Before surgery, you can play an important role.  Because skin is not sterile, your skin needs to be as free of germs as possible.  You can reduce the number of germs on your skin by washing with CHG (chlorahexidine gluconate) soap before surgery.  CHG is an antiseptic cleaner which kills germs and bonds with the skin to continue killing germs even after washing. Please DO NOT use if you have an allergy to CHG or antibacterial soaps.  If your skin becomes reddened/irritated stop using the CHG and inform your nurse when you arrive at Short Stay. Do not shave (including legs and underarms) for at least 48 hours prior to the first CHG shower.  You may shave your face/neck. Please follow these instructions carefully:  1.  Shower with CHG Soap the night before surgery and the  morning of Surgery.  2.  If you choose to wash your hair, wash your hair first as usual with your  normal  shampoo.  3.  After you shampoo, rinse your hair and body thoroughly to remove the  shampoo.                           4.  Use CHG as you would any other liquid soap.  You can apply chg directly  to the skin and wash                       Gently with a scrungie or clean washcloth.  5.  Apply the CHG Soap to your body ONLY FROM THE NECK DOWN.   Do not use on face/ open                           Wound or open sores. Avoid contact with eyes, ears mouth and genitals (private parts).                       Wash face,  Genitals (private parts) with your normal soap.             6.  Wash thoroughly, paying special attention to the area where your surgery  will be performed.  7.  Thoroughly rinse your body with warm water from the neck down.  8.  DO NOT shower/wash with your normal soap after using and rinsing off  the CHG Soap.                9.  Pat yourself dry with a clean towel.            10.  Wear clean pajamas.            11.  Place clean sheets on  your bed the night of your first shower and do not  sleep with pets. Day of Surgery : Do not apply any lotions/deodorants the morning of surgery.  Please wear clean clothes to the hospital/surgery center.  FAILURE TO FOLLOW THESE INSTRUCTIONS MAY RESULT IN THE CANCELLATION OF YOUR SURGERY PATIENT SIGNATURE_________________________________  NURSE SIGNATURE__________________________________  ________________________________________________________________________

## 2023-11-13 ENCOUNTER — Encounter (HOSPITAL_COMMUNITY): Payer: Self-pay

## 2023-11-13 ENCOUNTER — Encounter (HOSPITAL_COMMUNITY)
Admission: RE | Admit: 2023-11-13 | Discharge: 2023-11-13 | Disposition: A | Discharge status: Home / Self Care | Source: Ambulatory Visit | Attending: General Surgery | Admitting: General Surgery

## 2023-11-13 ENCOUNTER — Other Ambulatory Visit: Payer: Self-pay

## 2023-11-13 VITALS — BP 110/57 | HR 76 | Temp 98.6°F | Ht 59.0 in | Wt 164.0 lb

## 2023-11-13 DIAGNOSIS — Z01818 Encounter for other preprocedural examination: Secondary | ICD-10-CM | POA: Diagnosis not present

## 2023-11-13 DIAGNOSIS — I4510 Unspecified right bundle-branch block: Secondary | ICD-10-CM | POA: Insufficient documentation

## 2023-11-13 DIAGNOSIS — E1169 Type 2 diabetes mellitus with other specified complication: Secondary | ICD-10-CM | POA: Diagnosis not present

## 2023-11-13 DIAGNOSIS — I10 Essential (primary) hypertension: Secondary | ICD-10-CM | POA: Insufficient documentation

## 2023-11-13 DIAGNOSIS — E782 Mixed hyperlipidemia: Secondary | ICD-10-CM | POA: Diagnosis not present

## 2023-11-13 LAB — SURGICAL PCR SCREEN
MRSA, PCR: NEGATIVE
Staphylococcus aureus: NEGATIVE

## 2023-11-13 LAB — GLUCOSE, CAPILLARY: Glucose-Capillary: 218 mg/dL — ABNORMAL HIGH (ref 70–99)

## 2023-11-13 NOTE — Progress Notes (Signed)
 For Anesthesia: PCP - Mechele Claude, MD . Theron Arista: 10/23/23 Cardiologist - N/A  Bowel Prep reminder:  Chest x-ray -  EKG - 11/13/23 Stress Test -  ECHO -  Cardiac Cath -  Pacemaker/ICD device last checked: Pacemaker orders received: Device Rep notified:  Spinal Cord Stimulator:N/A  Sleep Study - N/A CPAP -   Fasting Blood Sugar - 100's Checks Blood Sugar __1___ times a day Date and result of last Hgb A1c-8.2: 10/23/23  Last dose of GLP1 agonist- Trulicity. Last dose: 11/09/23 GLP1 instructions: To keep on hold  Last dose of SGLT-2 inhibitors- N/A SGLT-2 instructions:   Blood Thinner Instructions:N/A Aspirin Instructions: Last Dose:  Activity level: Can go up a flight of stairs and activities of daily living without stopping and without chest pain and/or shortness of breath   Able to exercise without chest pain and/or shortness of breath  Anesthesia review: Hx: HTN,DIA  Patient denies shortness of breath, fever, cough and chest pain at PAT appointment   Patient verbalized understanding of instructions that were given to them at the PAT appointment. Patient was also instructed that they will need to review over the PAT instructions again at home before surgery.

## 2023-11-14 ENCOUNTER — Encounter (HOSPITAL_COMMUNITY): Payer: Self-pay | Admitting: General Surgery

## 2023-11-14 ENCOUNTER — Encounter (HOSPITAL_COMMUNITY)
Admission: RE | Disposition: A | Discharge status: Home / Self Care | Payer: Self-pay | Source: Ambulatory Visit | Attending: General Surgery

## 2023-11-14 ENCOUNTER — Ambulatory Visit (HOSPITAL_COMMUNITY)
Admission: RE | Admit: 2023-11-14 | Discharge: 2023-11-14 | Disposition: A | Discharge status: Home / Self Care | Source: Ambulatory Visit | Attending: General Surgery | Admitting: General Surgery

## 2023-11-14 ENCOUNTER — Other Ambulatory Visit: Payer: Self-pay

## 2023-11-14 ENCOUNTER — Ambulatory Visit (HOSPITAL_BASED_OUTPATIENT_CLINIC_OR_DEPARTMENT_OTHER): Admitting: Certified Registered"

## 2023-11-14 ENCOUNTER — Ambulatory Visit (HOSPITAL_COMMUNITY): Admitting: Certified Registered"

## 2023-11-14 DIAGNOSIS — K6289 Other specified diseases of anus and rectum: Secondary | ICD-10-CM | POA: Diagnosis not present

## 2023-11-14 DIAGNOSIS — I10 Essential (primary) hypertension: Secondary | ICD-10-CM | POA: Insufficient documentation

## 2023-11-14 DIAGNOSIS — Z87891 Personal history of nicotine dependence: Secondary | ICD-10-CM | POA: Diagnosis not present

## 2023-11-14 DIAGNOSIS — Z8261 Family history of arthritis: Secondary | ICD-10-CM | POA: Diagnosis not present

## 2023-11-14 DIAGNOSIS — Z794 Long term (current) use of insulin: Secondary | ICD-10-CM | POA: Diagnosis not present

## 2023-11-14 DIAGNOSIS — M1990 Unspecified osteoarthritis, unspecified site: Secondary | ICD-10-CM | POA: Insufficient documentation

## 2023-11-14 DIAGNOSIS — Z8249 Family history of ischemic heart disease and other diseases of the circulatory system: Secondary | ICD-10-CM | POA: Diagnosis not present

## 2023-11-14 DIAGNOSIS — E039 Hypothyroidism, unspecified: Secondary | ICD-10-CM

## 2023-11-14 DIAGNOSIS — E119 Type 2 diabetes mellitus without complications: Secondary | ICD-10-CM | POA: Diagnosis not present

## 2023-11-14 DIAGNOSIS — D013 Carcinoma in situ of anus and anal canal: Secondary | ICD-10-CM | POA: Insufficient documentation

## 2023-11-14 DIAGNOSIS — C210 Malignant neoplasm of anus, unspecified: Secondary | ICD-10-CM | POA: Diagnosis not present

## 2023-11-14 HISTORY — PX: RECTAL EXAM UNDER ANESTHESIA: SHX6399

## 2023-11-14 HISTORY — PX: ANAL INTRAEPITHELIAL NEOPLASIA EXCISION: SHX5241

## 2023-11-14 LAB — GLUCOSE, CAPILLARY
Glucose-Capillary: 161 mg/dL — ABNORMAL HIGH (ref 70–99)
Glucose-Capillary: 142 mg/dL — ABNORMAL HIGH (ref 70–99)

## 2023-11-14 SURGERY — EXAM UNDER ANESTHESIA, RECTUM
Anesthesia: General

## 2023-11-14 MED ORDER — ONDANSETRON HCL 4 MG/2ML IJ SOLN
INTRAMUSCULAR | Status: DC | PRN
Start: 2023-11-14 — End: 2023-11-14
  Administered 2023-11-14: 4 mg via INTRAVENOUS

## 2023-11-14 MED ORDER — ACETAMINOPHEN 500 MG PO TABS
1000.0000 mg | ORAL_TABLET | ORAL | Status: AC
  Administered 2023-11-14: 1000 mg via ORAL
  Filled 2023-11-14: qty 2

## 2023-11-14 MED ORDER — OXYCODONE HCL 5 MG PO TABS: 5.0000 mg | ORAL_TABLET | Freq: Once | ORAL | Status: DC | PRN

## 2023-11-14 MED ORDER — SODIUM CHLORIDE 0.9% FLUSH: 3.0000 mL | Freq: Two times a day (BID) | INTRAVENOUS | Status: DC

## 2023-11-14 MED ORDER — PROPOFOL 10 MG/ML IV BOLUS
INTRAVENOUS | Status: DC | PRN
  Administered 2023-11-14: 40 mg via INTRAVENOUS
  Administered 2023-11-14: 100 mg via INTRAVENOUS

## 2023-11-14 MED ORDER — ORAL CARE MOUTH RINSE: 15.0000 mL | Freq: Once | OROMUCOSAL | Status: AC

## 2023-11-14 MED ORDER — ACETAMINOPHEN 10 MG/ML IV SOLN: 1000.0000 mg | Freq: Once | INTRAVENOUS | Status: DC | PRN

## 2023-11-14 MED ORDER — OXYCODONE HCL 5 MG/5ML PO SOLN: 5.0000 mg | Freq: Once | ORAL | Status: DC | PRN

## 2023-11-14 MED ORDER — CHLORHEXIDINE GLUCONATE 0.12 % MT SOLN
15.0000 mL | Freq: Once | OROMUCOSAL | Status: AC
  Administered 2023-11-14: 15 mL via OROMUCOSAL

## 2023-11-14 MED ORDER — BUPIVACAINE LIPOSOME 1.3 % IJ SUSP
INTRAMUSCULAR | Status: AC
  Filled 2023-11-14: qty 20

## 2023-11-14 MED ORDER — ACETIC ACID 5 % SOLN
Status: AC
  Filled 2023-11-14: qty 50

## 2023-11-14 MED ORDER — GABAPENTIN 300 MG PO CAPS
300.0000 mg | ORAL_CAPSULE | ORAL | Status: AC
  Administered 2023-11-14: 300 mg via ORAL
  Filled 2023-11-14: qty 1

## 2023-11-14 MED ORDER — BUPIVACAINE-EPINEPHRINE (PF) 0.25% -1:200000 IJ SOLN
INTRAMUSCULAR | Status: AC
  Filled 2023-11-14: qty 30

## 2023-11-14 MED ORDER — BUPIVACAINE LIPOSOME 1.3 % IJ SUSP
20.0000 mL | Freq: Once | INTRAMUSCULAR | Status: DC
Start: 2023-11-14 — End: 2023-11-14

## 2023-11-14 MED ORDER — FENTANYL CITRATE (PF) 100 MCG/2ML IJ SOLN
INTRAMUSCULAR | Status: DC | PRN
  Administered 2023-11-14: 50 ug via INTRAVENOUS
  Administered 2023-11-14 (×2): 25 ug via INTRAVENOUS

## 2023-11-14 MED ORDER — EPHEDRINE SULFATE (PRESSORS) 50 MG/ML IJ SOLN
INTRAMUSCULAR | Status: DC | PRN
  Administered 2023-11-14: 5 mg via INTRAVENOUS

## 2023-11-14 MED ORDER — LACTATED RINGERS IV SOLN: INTRAVENOUS | Status: DC

## 2023-11-14 MED ORDER — FENTANYL CITRATE (PF) 100 MCG/2ML IJ SOLN
INTRAMUSCULAR | Status: AC
  Filled 2023-11-14: qty 2

## 2023-11-14 MED ORDER — FENTANYL CITRATE PF 50 MCG/ML IJ SOSY: 25.0000 ug | PREFILLED_SYRINGE | INTRAMUSCULAR | Status: DC | PRN

## 2023-11-14 MED ORDER — DROPERIDOL 2.5 MG/ML IJ SOLN: 0.6250 mg | Freq: Once | INTRAMUSCULAR | Status: DC | PRN

## 2023-11-14 MED ORDER — LIDOCAINE 2% (20 MG/ML) 5 ML SYRINGE
INTRAMUSCULAR | Status: DC | PRN
  Administered 2023-11-14: 40 mg via INTRAVENOUS
  Administered 2023-11-14: 20 mg via INTRAVENOUS

## 2023-11-14 MED ORDER — TRAMADOL HCL 50 MG PO TABS: 50.0000 mg | ORAL_TABLET | Freq: Four times a day (QID) | ORAL | 0 refills | Status: AC | PRN

## 2023-11-14 MED ORDER — 0.9 % SODIUM CHLORIDE (POUR BTL) OPTIME
TOPICAL | Status: DC | PRN
  Administered 2023-11-14: 1000 mL

## 2023-11-14 MED ORDER — ACETIC ACID 4% SOLUTION
Status: DC | PRN
  Administered 2023-11-14: 1 via TOPICAL

## 2023-11-14 MED ORDER — PHENYLEPHRINE HCL-NACL 20-0.9 MG/250ML-% IV SOLN
INTRAVENOUS | Status: DC | PRN
  Administered 2023-11-14: 50 ug/min via INTRAVENOUS

## 2023-11-14 MED ORDER — PROPOFOL 500 MG/50ML IV EMUL
INTRAVENOUS | Status: DC | PRN
  Administered 2023-11-14: 50 ug/kg/min via INTRAVENOUS

## 2023-11-14 MED ORDER — INSULIN ASPART 100 UNIT/ML IJ SOLN: 0.0000 [IU] | INTRAMUSCULAR | Status: DC | PRN

## 2023-11-14 MED ORDER — SUCCINYLCHOLINE CHLORIDE 200 MG/10ML IV SOSY
PREFILLED_SYRINGE | INTRAVENOUS | Status: DC | PRN
  Administered 2023-11-14: 100 mg via INTRAVENOUS

## 2023-11-14 MED ORDER — BUPIVACAINE-EPINEPHRINE 0.25% -1:200000 IJ SOLN
INTRAMUSCULAR | Status: DC | PRN
  Administered 2023-11-14: 30 mL

## 2023-11-14 SURGICAL SUPPLY — 34 items
BAG COUNTER SPONGE SURGICOUNT (BAG) IMPLANT
BENZOIN TINCTURE PRP APPL 2/3 (GAUZE/BANDAGES/DRESSINGS) ×1 IMPLANT
BLADE SURG 15 STRL LF DISP TIS (BLADE) IMPLANT
BLADE SURG SZ10 CARB STEEL (BLADE) ×1 IMPLANT
BRIEF MESH DISP LRG (UNDERPADS AND DIAPERS) ×2 IMPLANT
COVER SURGICAL LIGHT HANDLE (MISCELLANEOUS) ×1 IMPLANT
DRAPE UTILITY XL STRL (DRAPES) ×1 IMPLANT
DRSG VASELINE 3X18 (GAUZE/BANDAGES/DRESSINGS) IMPLANT
ELECT REM PT RETURN 15FT ADLT (MISCELLANEOUS) ×1 IMPLANT
GAUZE 4X4 16PLY ~~LOC~~+RFID DBL (SPONGE) IMPLANT
GAUZE PAD ABD 8X10 STRL (GAUZE/BANDAGES/DRESSINGS) IMPLANT
GAUZE SPONGE 4X4 12PLY STRL (GAUZE/BANDAGES/DRESSINGS) IMPLANT
GLOVE BIO SURGEON STRL SZ 6.5 (GLOVE) ×1 IMPLANT
GLOVE INDICATOR 6.5 STRL GRN (GLOVE) ×2 IMPLANT
GOWN STRL REUS W/ TWL XL LVL3 (GOWN DISPOSABLE) ×2 IMPLANT
KIT BASIN OR (CUSTOM PROCEDURE TRAY) ×1 IMPLANT
KIT TURNOVER KIT A (KITS) IMPLANT
LEGGING LITHOTOMY PAIR STRL (DRAPES) IMPLANT
LOOP VESSEL MAXI BLUE (MISCELLANEOUS) IMPLANT
NDL HYPO 25X1 1.5 SAFETY (NEEDLE) ×1 IMPLANT
NDL SAFETY ECLIPSE 18X1.5 (NEEDLE) IMPLANT
NEEDLE HYPO 25X1 1.5 SAFETY (NEEDLE) ×1 IMPLANT
PACK LITHOTOMY IV (CUSTOM PROCEDURE TRAY) ×1 IMPLANT
PENCIL SMOKE EVACUATOR (MISCELLANEOUS) IMPLANT
SPIKE FLUID TRANSFER (MISCELLANEOUS) ×1 IMPLANT
SPONGE SURGIFOAM ABS GEL 12-7 (HEMOSTASIS) IMPLANT
SUT CHROMIC 2 0 SH (SUTURE) IMPLANT
SUT ETHIBOND 0 (SUTURE) IMPLANT
SUT ETHILON 2 0 PS N (SUTURE) IMPLANT
SUT GUT CHROMIC 3 0 (SUTURE) IMPLANT
SUT VIC AB 2-0 SH 27X BRD (SUTURE) IMPLANT
SUT VIC AB 3-0 SH 27XBRD (SUTURE) IMPLANT
SYR CONTROL 10ML LL (SYRINGE) ×1 IMPLANT
TOWEL OR 17X26 10 PK STRL BLUE (TOWEL DISPOSABLE) ×1 IMPLANT

## 2023-11-14 NOTE — Discharge Instructions (Addendum)

## 2023-11-14 NOTE — Anesthesia Preprocedure Evaluation (Signed)
 Anesthesia Evaluation  Patient identified by MRN, date of birth, ID band Patient awake    Reviewed: Allergy & Precautions, NPO status , Patient's Chart, lab work & pertinent test results  Airway Mallampati: II  TM Distance: <3 FB Neck ROM: Full    Dental  (+) Upper Dentures, Lower Dentures   Pulmonary former smoker   Pulmonary exam normal breath sounds clear to auscultation       Cardiovascular hypertension, Pt. on medications Normal cardiovascular exam Rhythm:Regular Rate:Normal     Neuro/Psych negative neurological ROS  negative psych ROS   GI/Hepatic Neg liver ROS,,,Anal intraepithelial neoplasia III   Endo/Other  diabetes, Type 2, Insulin DependentHypothyroidism    Renal/GU negative Renal ROS  negative genitourinary   Musculoskeletal  (+) Arthritis ,    Abdominal   Peds negative pediatric ROS (+)  Hematology negative hematology ROS (+)   Anesthesia Other Findings   Reproductive/Obstetrics negative OB ROS                              Anesthesia Physical Anesthesia Plan  ASA: 3  Anesthesia Plan: General   Post-op Pain Management: Regional block*   Induction: Intravenous  PONV Risk Score and Plan: 3 and Ondansetron, Dexamethasone, Droperidol and Treatment may vary due to age or medical condition  Airway Management Planned: LMA  Additional Equipment: None  Intra-op Plan:   Post-operative Plan: Extubation in OR  Informed Consent: I have reviewed the patients History and Physical, chart, labs and discussed the procedure including the risks, benefits and alternatives for the proposed anesthesia with the patient or authorized representative who has indicated his/her understanding and acceptance.     Dental advisory given  Plan Discussed with: CRNA and Surgeon  Anesthesia Plan Comments:          Anesthesia Quick Evaluation

## 2023-11-14 NOTE — Interval H&P Note (Signed)
 History and Physical Interval Note:  11/14/2023 10:13 AM  Morgan Schmidt  has presented today for surgery, with the diagnosis of AIN.  The various methods of treatment have been discussed with the patient and family. After consideration of risks, benefits and other options for treatment, the patient has consented to  Procedure(s) with comments: EXAM UNDER ANESTHESIA, RECTUM (N/A) - EXAM UNDER ANESTHESIA WITH BIOPSY, POSSIBLE EXCISION OR ABLATION EXCISION, NEOPLASM, INTRAEPITHELIAL, ANUS (N/A) - EXAM UNDER ANESTHESIA WITH BIOPSY, POSSIBLE EXCISION OR ABLATION as a surgical intervention.  The patient's history has been reviewed, patient examined, no change in status, stable for surgery.  I have reviewed the patient's chart and labs.  Questions were answered to the patient's satisfaction.     Vanita Panda, MD  Colorectal and General Surgery [facility]

## 2023-11-14 NOTE — Op Note (Signed)
 11/14/2023  12:30 PM  PATIENT:  Morgan Schmidt  73 y.o. female  Patient Care Team: Mechele Claude, MD as PCP - General (Family Medicine) Griselda Miner, MD as Consulting Physician (General Surgery) Serena Croissant, MD as Consulting Physician (Hematology and Oncology) Antony Blackbird, MD as Consulting Physician (Radiation Oncology) Serena Croissant, MD as Consulting Physician (Hematology and Oncology)  PRE-OPERATIVE DIAGNOSIS:  AIN  POST-OPERATIVE DIAGNOSIS:  AIN  PROCEDURE:  EXAM UNDER ANESTHESIA, BIOPSY OF ANAL MASS   Surgeon(s): Romie Levee, MD  ASSISTANT: none   ANESTHESIA:   local and general  SPECIMEN:  Source of Specimen:  anterior anal canal mass  DISPOSITION OF SPECIMEN:  PATHOLOGY  COUNTS:  YES  PLAN OF CARE: Discharge to home after PACU  PATIENT DISPOSITION:  PACU - hemodynamically stable.  INDICATION: 73 y.o. F with history of AIN undergoing surveillance.  She has developed a new anterior canal lesion.     OR FINDINGS: anterior anal canal lesion  DESCRIPTION: the patient was identified in the preoperative holding area and taken to the OR where they were laid on the operating room table.  General anesthesia was induced without difficulty. The patient was then positioned in prone jackknife position with buttocks gently taped apart.  The patient was then prepped and draped in usual sterile fashion.  SCDs were noted to be in place prior to the initiation of anesthesia. A surgical timeout was performed indicating the correct patient, procedure, positioning and need for preoperative antibiotics.  A rectal block was performed using Marcaine with epinephrine.    I began with a digital rectal exam.  The anal canal was gently dilated.  I then placed a Hill-Ferguson anoscope into the anal canal and evaluated this completely.  I placed an acetic acid soaked sponge in the anal canal and allowed this to soak for 2 minutes.  There was an area of acetowhite noted around anterior  midline just distal to the dentate line.  There was an area in the middle with a more palpable lesion that was friable to touch.  This area was excised using Metzenbaum scissors and the area around it that stained acetowhite was ablated using electrocautery.  I then closed the biopsy site with a figure-of-eight 2-0 chromic suture.  I evaluated the rest of the anal canal.  There were no other acetowhite lesions noted.  Additional Marcaine was placed for postoperative hemostasis and pain control.  The patient will be awakened from anesthesia and sent to the postanesthesia care unit in stable condition.  All counts were correct per operating room staff.  Vanita Panda, MD  Colorectal and General Surgery [facility]

## 2023-11-14 NOTE — Anesthesia Procedure Notes (Signed)
 Procedure Name: Intubation Date/Time: 11/14/2023 12:01 PM  Performed by: Ponciano Ort, CRNAPre-anesthesia Checklist: Patient identified, Emergency Drugs available, Suction available and Patient being monitored Patient Re-evaluated:Patient Re-evaluated prior to induction Oxygen Delivery Method: Circle system utilized Preoxygenation: Pre-oxygenation with 100% oxygen Induction Type: IV induction and Rapid sequence Ventilation: Mask ventilation without difficulty Laryngoscope Size: Mac and 3 Grade View: Grade I Tube type: Oral Tube size: 7.0 mm Number of attempts: 1 Airway Equipment and Method: Stylet and Oral airway Placement Confirmation: ETT inserted through vocal cords under direct vision, positive ETCO2 and breath sounds checked- equal and bilateral Secured at: 20 cm Tube secured with: Tape Dental Injury: Teeth and Oropharynx as per pre-operative assessment

## 2023-11-14 NOTE — Anesthesia Postprocedure Evaluation (Signed)
 Anesthesia Post Note  Patient: Morgan Schmidt  Procedure(s) Performed: Francia Greaves UNDER ANESTHESIA, RECTUM EXCISION, NEOPLASM, INTRAEPITHELIAL, ANUS     Patient location during evaluation: PACU Anesthesia Type: General Level of consciousness: awake and alert Pain management: pain level controlled Vital Signs Assessment: post-procedure vital signs reviewed and stable Respiratory status: spontaneous breathing, nonlabored ventilation, respiratory function stable and patient connected to nasal cannula oxygen Cardiovascular status: blood pressure returned to baseline and stable Postop Assessment: no apparent nausea or vomiting Anesthetic complications: no   No notable events documented.  Last Vitals:  Vitals:   11/14/23 1328 11/14/23 1330  BP:  (!) 109/58  Pulse: 77 77  Resp: 15 16  Temp:    SpO2: 97% 96%    Last Pain:  Vitals:   11/14/23 1330  TempSrc:   PainSc: 0-No pain                 [place]

## 2023-11-14 NOTE — Transfer of Care (Signed)
 Immediate Anesthesia Transfer of Care Note  Patient: Morgan Schmidt  Procedure(s) Performed: Francia Greaves UNDER ANESTHESIA, RECTUM EXCISION, NEOPLASM, INTRAEPITHELIAL, ANUS  Patient Location: PACU  Anesthesia Type:General  Level of Consciousness: awake, alert , oriented, and patient cooperative  Airway & Oxygen Therapy: Patient Spontanous Breathing and Patient connected to face mask oxygen  Post-op Assessment: Report given to RN and Post -op Vital signs reviewed and stable  Post vital signs: Reviewed and stable  Last Vitals:  Vitals Value Taken Time  BP 106/50 11/14/23 1300  Temp    Pulse 79 11/14/23 1302  Resp 18 11/14/23 1302  SpO2 97 % 11/14/23 1302  Vitals shown include unfiled device data.  Last Pain:  Vitals:   11/14/23 1300  TempSrc:   PainSc: Asleep         Complications: No notable events documented.

## 2023-11-15 ENCOUNTER — Encounter (HOSPITAL_COMMUNITY): Payer: Self-pay | Admitting: General Surgery

## 2023-11-20 LAB — SURGICAL PATHOLOGY

## 2023-11-27 ENCOUNTER — Other Ambulatory Visit: Payer: Self-pay | Admitting: Family Medicine

## 2023-11-27 DIAGNOSIS — E118 Type 2 diabetes mellitus with unspecified complications: Secondary | ICD-10-CM

## 2023-12-11 ENCOUNTER — Other Ambulatory Visit: Payer: Self-pay

## 2023-12-11 NOTE — Progress Notes (Signed)
 The proposed treatment discussed in conference is for discussion purpose only and is not a binding recommendation.  The patients have not been physically examined, or presented with their treatment options.  Therefore, final treatment plans cannot be decided.

## 2023-12-16 ENCOUNTER — Ambulatory Visit: Payer: Medicare HMO | Attending: Hematology and Oncology

## 2023-12-16 DIAGNOSIS — Z483 Aftercare following surgery for neoplasm: Secondary | ICD-10-CM | POA: Insufficient documentation

## 2023-12-25 ENCOUNTER — Other Ambulatory Visit: Payer: Self-pay | Admitting: Family Medicine

## 2024-01-13 ENCOUNTER — Other Ambulatory Visit: Payer: Self-pay | Admitting: Family Medicine

## 2024-01-20 ENCOUNTER — Other Ambulatory Visit: Payer: Self-pay | Admitting: Family Medicine

## 2024-01-20 DIAGNOSIS — Z9889 Other specified postprocedural states: Secondary | ICD-10-CM

## 2024-01-27 ENCOUNTER — Ambulatory Visit: Attending: Hematology and Oncology

## 2024-01-27 VITALS — Wt 167.2 lb

## 2024-01-27 DIAGNOSIS — Z483 Aftercare following surgery for neoplasm: Secondary | ICD-10-CM | POA: Insufficient documentation

## 2024-01-27 NOTE — Therapy (Signed)
 OUTPATIENT PHYSICAL THERAPY SOZO SCREENING NOTE   Patient Name: Morgan Schmidt MRN: [id number] DOB:1951/04/01, 73 y.o., female Today's Date: 01/27/2024  PCP: Roise Cleaver, MD REFERRING PROVIDER: Cameron Cea, MD   PT End of Session - 01/27/24 [id number]     Visit Number 5   # unchanged due to screen only   PT Start Time 0951    PT Stop Time 0955    PT Time Calculation (min) 4 min    Activity Tolerance Patient tolerated treatment well    Behavior During Therapy [facility] for tasks assessed/performed             Past Medical History:  Diagnosis Date   AIN (anal intraepithelial neoplasia) anal canal    Cancer (HCC) 03/15/2022   breast   History of adenomatous polyp of colon    History of radiation therapy    Left breast- 05/01/22-05/22/22- Dr. Retta Caster   History of thyroid  cancer 06/2008   s/p  total bilateral thyroidectomy for symptomatic multinodular goiter;   06-30-2008 s/p total thyroidectomy -- papillary thyroid  cancer  and treated w/ RAI post surgery 09-29-2008   (last scan negative for recurrence in epic 03/ 2011)   Hyperlipidemia    Hypertension    Hypothyroidism, postsurgical 06/2008   and had RAI post surgery 02/ 2010;   followed by pcp   OA (osteoarthritis)    Personal history of radiation therapy    Type 2 diabetes mellitus treated with insulin  (HCC)    followed by pcp   (11-23-2021  per pt checks daily in am and at times in afternoon,  fasting sugar--- 95--110)   Wears dentures    upper   Past Surgical History:  Procedure Laterality Date   ANAL INTRAEPITHELIAL NEOPLASIA EXCISION N/A 11/30/2021   Procedure: EXCISIONAL BIOPSY ANAL LESIONS;  Surgeon: Joyce Nixon, MD;  Location: [facility];  Service: General;  Laterality: N/A;   ANAL INTRAEPITHELIAL NEOPLASIA EXCISION N/A 11/14/2023   Procedure: EXCISION, NEOPLASM, INTRAEPITHELIAL, ANUS;  Surgeon: Joyce Nixon, MD;  Location: [facility];  Service: General;  Laterality: N/A;  EXAM UNDER ANESTHESIA  WITH BIOPSY   BREAST LUMPECTOMY WITH RADIOACTIVE SEED AND SENTINEL LYMPH NODE BIOPSY Left 03/22/2022   Procedure: LEFT BREAST LUMPECTOMY WITH RADIOACTIVE SEED AND SENTINEL LYMPH NODE BIOPSY;  Surgeon: Caralyn Chandler, MD;  Location: [facility];  Service: General;  Laterality: Left;  GEN & PEC BLOCK   CATARACT EXTRACTION Left 06/2023   CESAREAN SECTION  1985   W/  BILATERAL TUBAL LIGATION   COLONOSCOPY WITH PROPOFOL   11/10/2021   by Dr.Pyrtle   HIGH RESOLUTION ANOSCOPY N/A 11/30/2021   Procedure: HIGH RESOLUTION ANOSCOPY;  Surgeon: Joyce Nixon, MD;  Location: [facility];  Service: General;  Laterality: N/A;   HIP PINNING,CANNULATED Left 03/10/2018   Procedure: CANNULATED HIP PINNING;  Surgeon: Claiborne Crew, MD;  Location: [facility] OR;  Service: Orthopedics;  Laterality: Left;   KNEE ARTHROSCOPY Left 10/22/2016   Procedure: ARTHROSCOPY LEFT KNEE WITH DEBRIDEMENT;  Surgeon: Claiborne Crew, MD;  Location: [facility];  Service: Orthopedics;  Laterality: Left;   KNEE ARTHROSCOPY WITH MEDIAL MENISECTOMY  10/22/2016   Procedure: KNEE ARTHROSCOPY WITH MEDIAL MENISECTOMY;  Surgeon: Claiborne Crew, MD;  Location: [facility];  Service: Orthopedics;;   KNEE ARTHROSCOPY WITH SUBCHONDROPLASTY Left 10/22/2016   Procedure: KNEE ARTHROSCOPY WITH MEDIAL CHONDROPLASTY;  Surgeon: Claiborne Crew, MD;  Location: [facility];  Service: Orthopedics;  Laterality: Left;   RECTAL  EXAM UNDER ANESTHESIA N/A 11/14/2023   Procedure: EXAM UNDER ANESTHESIA, RECTUM;  Surgeon: Joyce Nixon, MD;  Location: [facility];  Service: General;  Laterality: N/A;  EXAM UNDER ANESTHESIA WITH BIOPSY, AND EXCISION   TOTAL THYROIDECTOMY Bilateral 06/30/2008   @[facility]  by dr Alray Askew   Patient Active Problem List   Diagnosis Date Noted   Genetic testing 03/20/2022   Malignant neoplasm of upper-outer quadrant of left breast in female, estrogen receptor positive (HCC) 02/26/2022    Pain in left knee 07/01/2018   Controlled diabetes mellitus type 2 with complications (HCC) 03/19/2018   Essential hypertension 03/23/2016   Other specified hypothyroidism 12/16/2014   Mixed diabetic hyperlipidemia associated with type 2 diabetes mellitus (HCC) 12/16/2014   Long term current use of insulin  (HCC) 12/16/2014   Polyposis of colon 12/16/2014    REFERRING DIAG: left breast cancer at risk for lymphedema  THERAPY DIAG: Aftercare following surgery for neoplasm  PERTINENT HISTORY: Patient was diagnosed on 02/21/22 with left grade 2. It measures 0.7 cm and is located in the upper-outer quadrant. It is ER+, PR+, HER2- with a Ki67 of 3%. Hx of rectal cancer and surgery in 12/2021. 03/22/22- L breast lumpectomy and SLNB 0/2, completed radiation 05/22/22  PRECAUTIONS: left UE Lymphedema risk, None  SUBJECTIVE: Pt returns for her 6 month L-Dex screen. "I still wear my sleeve prn."  PAIN:  Are you having pain? No  SOZO SCREENING: Patient was assessed today using the SOZO machine to determine the lymphedema index score. This was compared to her baseline score. It was determined that she is within the recommended range when compared to her baseline and no further action is needed at this time. She will continue SOZO screenings. These are done every 3 months for 2 years post operatively followed by every 6 months for 2 years, and then annually.   L-DEX FLOWSHEETS - 01/27/24 0900       L-DEX LYMPHEDEMA SCREENING   Measurement Type Unilateral    L-DEX MEASUREMENT EXTREMITY Upper Extremity    POSITION  Standing    DOMINANT SIDE Right    At Risk Side Left    BASELINE SCORE (UNILATERAL) 0    L-DEX SCORE (UNILATERAL) 4.9    VALUE CHANGE (UNILAT) 4.9               Denyce Flank, PTA 01/27/2024, 9:55 AM

## 2024-02-05 ENCOUNTER — Ambulatory Visit: Admitting: Family Medicine

## 2024-02-05 ENCOUNTER — Other Ambulatory Visit: Payer: Self-pay | Admitting: Family Medicine

## 2024-02-05 DIAGNOSIS — E118 Type 2 diabetes mellitus with unspecified complications: Secondary | ICD-10-CM

## 2024-02-19 ENCOUNTER — Other Ambulatory Visit: Payer: Self-pay | Admitting: Family Medicine

## 2024-02-19 ENCOUNTER — Ambulatory Visit
Admission: RE | Admit: 2024-02-19 | Discharge: 2024-02-19 | Disposition: A | Discharge status: Home / Self Care | Source: Ambulatory Visit | Attending: Family Medicine | Admitting: Family Medicine

## 2024-02-19 ENCOUNTER — Ambulatory Visit: Admitting: Family Medicine

## 2024-02-19 ENCOUNTER — Encounter: Payer: Self-pay | Admitting: Family Medicine

## 2024-02-19 VITALS — BP 129/56 | HR 79 | Temp 98.2°F | Ht 59.0 in | Wt 170.0 lb

## 2024-02-19 DIAGNOSIS — Z794 Long term (current) use of insulin: Secondary | ICD-10-CM

## 2024-02-19 DIAGNOSIS — Z853 Personal history of malignant neoplasm of breast: Secondary | ICD-10-CM | POA: Diagnosis not present

## 2024-02-19 DIAGNOSIS — Z08 Encounter for follow-up examination after completed treatment for malignant neoplasm: Secondary | ICD-10-CM | POA: Diagnosis not present

## 2024-02-19 DIAGNOSIS — E039 Hypothyroidism, unspecified: Secondary | ICD-10-CM

## 2024-02-19 DIAGNOSIS — E1169 Type 2 diabetes mellitus with other specified complication: Secondary | ICD-10-CM

## 2024-02-19 DIAGNOSIS — E782 Mixed hyperlipidemia: Secondary | ICD-10-CM | POA: Diagnosis not present

## 2024-02-19 DIAGNOSIS — Z9889 Other specified postprocedural states: Secondary | ICD-10-CM

## 2024-02-19 DIAGNOSIS — E038 Other specified hypothyroidism: Secondary | ICD-10-CM

## 2024-02-19 DIAGNOSIS — I10 Essential (primary) hypertension: Secondary | ICD-10-CM | POA: Diagnosis not present

## 2024-02-19 DIAGNOSIS — E118 Type 2 diabetes mellitus with unspecified complications: Secondary | ICD-10-CM

## 2024-02-19 LAB — BAYER DCA HB A1C WAIVED: HB A1C (BAYER DCA - WAIVED): 7.2 % — ABNORMAL HIGH (ref 4.8–5.6)

## 2024-02-19 MED ORDER — PANTOPRAZOLE SODIUM 40 MG PO TBEC: 40.0000 mg | DELAYED_RELEASE_TABLET | Freq: Every day | ORAL | 11 refills | Status: AC

## 2024-02-19 MED ORDER — LEVOTHYROXINE SODIUM 88 MCG PO TABS: 88.0000 ug | ORAL_TABLET | Freq: Every day | ORAL | 1 refills | Status: DC

## 2024-02-19 MED ORDER — TRULICITY 4.5 MG/0.5ML ~~LOC~~ SOAJ: 4.5000 mg | SUBCUTANEOUS | 3 refills | Status: AC

## 2024-02-19 MED ORDER — LANTUS SOLOSTAR 100 UNIT/ML ~~LOC~~ SOPN: 110.0000 [IU] | PEN_INJECTOR | Freq: Every day | SUBCUTANEOUS | 0 refills | Status: DC

## 2024-02-19 MED ORDER — OLMESARTAN MEDOXOMIL-HCTZ 40-25 MG PO TABS: 1.0000 | ORAL_TABLET | Freq: Every day | ORAL | 0 refills | Status: DC

## 2024-02-19 MED ORDER — METFORMIN HCL 1000 MG PO TABS: 1000.0000 mg | ORAL_TABLET | Freq: Two times a day (BID) | ORAL | 3 refills | Status: AC

## 2024-02-19 NOTE — Progress Notes (Signed)
 Subjective:  [name], female    DOB: 1951-05-27  Age: 73 y.o. MRN: [id number]  CC: Follow-up   HPI SHYRL OBI presents forFollow-up of diabetes. Patient checks blood sugar at home.   100-125 fasting and sometimes a little over 200 postprandial Patient denies symptoms such as polyuria, polydipsia, excessive hunger, nausea No significant hypoglycemic spells noted. Medications reviewed. Pt reports taking them regularly without complication/adverse reaction being reported today.    follow-up on  thyroid . The patient has a history of hypothyroidism for many years. It has been stable recently. Pt. denies any change in  voice, loss of hair, heat or cold intolerance. Energy level has been adequate to good. Patient denies constipation and diarrhea. No myxedema. Medication is as noted below. Verified that pt is taking it daily on an empty stomach. Well tolerated.   presents for  follow-up of hypertension. Patient has no history of headache chest pain or shortness of breath or recent cough. Patient also denies symptoms of TIA such as focal numbness or weakness. Patient denies side effects from medication. States taking it regularly.  History Sagan has a past medical history of AIN (anal intraepithelial neoplasia) anal canal, Cancer (HCC) (03/15/2022), History of adenomatous polyp of colon, History of radiation therapy, History of thyroid  cancer (06/2008), Hyperlipidemia, Hypertension, Hypothyroidism, postsurgical (06/2008), OA (osteoarthritis), Personal history of radiation therapy, Type 2 diabetes mellitus treated with insulin  (HCC), and Wears dentures.   She has a past surgical history that includes Total thyroidectomy (Bilateral, 06/30/2008); Cesarean section (1985); Knee arthroscopy (Left, 10/22/2016); Knee arthroscopy with medial menisectomy (10/22/2016); Knee arthroscopy with subchondroplasty (Left, 10/22/2016); Hip pinning, cannulated (Left, 03/10/2018); Colonoscopy with  propofol  (11/10/2021); Anal intraepithelial neoplasia excision (N/A, 11/30/2021); High resolution anoscopy (N/A, 11/30/2021); Breast lumpectomy with radioactive seed and sentinel lymph node biopsy (Left, 03/22/2022); Cataract extraction (Left, 06/2023); Rectal exam under anesthesia (N/A, 11/14/2023); and Anal intraepithelial neoplasia excision (N/A, 11/14/2023).   Her family history includes Arthritis in her father; Heart disease in her mother.She reports that she quit smoking about 23 years ago. Her smoking use included cigarettes. She started smoking about 28 years ago. She has a 2.5 pack-year smoking history. She has never used smokeless tobacco. She reports that she does not drink alcohol and does not use drugs.  Current Outpatient Medications on File Prior to Visit  Medication Sig Dispense Refill   Accu-Chek FastClix Lancets MISC Use to check Blood Sugars twice daily. Dx E11.8 204 each 3   acetaminophen  (TYLENOL ) 325 MG tablet Take 325 mg by mouth every 6 (six) hours as needed (for pain or headaches).     Alcohol Swabs  (B-D SINGLE USE SWABS  REGULAR) PADS Use to check Blood Sugars twice daily. Dx E11.8 200 each 3   alendronate  (FOSAMAX ) 70 MG tablet TAKE 1 TABLET EVERY 7 DAYS WITH A FULL GLASS OF WATER ON AN EMPTY STOMACH. DO NOT LAY DOWN FOR AT LEAST 2 HOURS 12 tablet 3   anastrozole  (ARIMIDEX ) 1 MG tablet TAKE 1 TABLET EVERY DAY 90 tablet 3   Bacillus Coagulans-Inulin (PROBIOTIC-PREBIOTIC PO) Take 1 capsule by mouth daily as needed (upset stomach).     Blood Glucose Calibration (ACCU-CHEK AVIVA) SOLN Use to check Blood Sugars twice daily. Dx E11.8 1 each 3   Blood Glucose Monitoring Suppl (ACCU-CHEK AVIVA PLUS) w/Device KIT Use to check Blood Sugars twice daily. Dx E11.8 1 kit 0   Cholecalciferol (VITAMIN D ) 2000 UNITS tablet Take 1 tablet (2,000 Units total) by mouth daily. 30 tablet  11   diclofenac  sodium (VOLTAREN ) 1 % GEL Apply 2-4 g topically See admin instructions. Apply 2-4 grams to each  hand up to 4 times a day as needed for arthritis in the fingers 100 g 11   glucose blood (ACCU-CHEK AVIVA PLUS) test strip CHECK BLOOD SUGAR TWO TIMES DAILY 200 strip 3   ondansetron  (ZOFRAN -ODT) 8 MG disintegrating tablet Take 1 tablet (8 mg total) by mouth every 6 (six) hours as needed for nausea or vomiting. 20 tablet 1   rosuvastatin  (CRESTOR ) 20 MG tablet TAKE 1 TABLET EVERY DAY FOR CHOLESTEROL 90 tablet 3   traMADol  (ULTRAM ) 50 MG tablet Take 1-2 tablets (50-100 mg total) by mouth every 6 (six) hours as needed. 30 tablet 0   No current facility-administered medications on file prior to visit.    ROS Review of Systems  Constitutional: Negative.   HENT: Negative.    Eyes:  Negative for visual disturbance.  Respiratory:  Negative for shortness of breath.   Cardiovascular:  Negative for chest pain.  Gastrointestinal:  Negative for abdominal pain.  Musculoskeletal:  Negative for arthralgias.    Objective:  BP (!) 129/56   Pulse 79   Temp 98.2 F (36.8 C)   Ht 4' 11 (1.499 m)   Wt 170 lb (77.1 kg)   LMP 03/02/2004   SpO2 97%   BMI 34.34 kg/m   BP Readings from Last 3 Encounters:  02/20/24 132/64  02/19/24 (!) 129/56  11/14/23 (!) 130/51    Wt Readings from Last 3 Encounters:  02/20/24 170 lb 8 oz (77.3 kg)  02/19/24 170 lb (77.1 kg)  01/27/24 167 lb 4 oz (75.9 kg)    Lab Results  Component Value Date   HGBA1C 7.2 (H) 02/19/2024   HGBA1C 8.2 (H) 10/23/2023   HGBA1C 6.8 (H) 07/25/2023    Physical Exam Constitutional:      General: She is not in acute distress.    Appearance: She is well-developed.  Cardiovascular:     Rate and Rhythm: Normal rate and regular rhythm.  Pulmonary:     Breath sounds: Normal breath sounds.  Musculoskeletal:        General: Normal range of motion.  Skin:    General: Skin is warm and dry.  Neurological:     Mental Status: She is alert and oriented to person, place, and time.        Assessment & Plan:  Mixed diabetic  hyperlipidemia associated with type 2 diabetes mellitus (HCC) -     Bayer DCA Hb A1c Waived -     CMP14+EGFR -     Lipid panel -     Trulicity ; Inject 4.5 mg into the skin once a week.  Dispense: 6 mL; Refill: 3 -     Lantus  SoloStar; Inject 110 Units into the skin daily after breakfast.  Dispense: 105 mL; Refill: 0 -     metFORMIN  HCl; Take 1 tablet (1,000 mg total) by mouth 2 (two) times daily with a meal.  Dispense: 180 tablet; Refill: 3  Hypothyroidism, unspecified type -     TSH + free T4 -     Levothyroxine  Sodium; Take 1 tablet (88 mcg total) by mouth daily before breakfast.  Dispense: 90 tablet; Refill: 1  Essential hypertension -     Olmesartan  Medoxomil-HCTZ; Take 1 tablet by mouth daily.  Dispense: 90 tablet; Refill: 0  Other orders -     Pantoprazole  Sodium; Take 1 tablet (40 mg total) by mouth daily.  For stomach  Dispense: 30 tablet; Refill: 11      Follow-up: Return in about 3 months (around 05/21/2024).  Butler Der, M.D.

## 2024-02-19 NOTE — Assessment & Plan Note (Signed)
 03/22/2022:Left lumpectomy: 9 mm grade 2 IDC with DCIS with clear margins 0/2 lymph nodes negative ER 95%, PR 90%, HER2 2+ by IHC negative by FISH, Ki-67 3% Oncotype DX recurrence score: 21 (risk of distant recurrence: 7%)   Treatment plan: 1.  adjuvant radiation 05/02/2022-05/22/2022 3.  Adjuvant antiestrogen therapy with letrozole  started November 2023 discontinued December 2023 because of joint pains switched to anastrozole  10/04/2022 (due to shoulder and hip pain)   Anastrozole  toxicities: Still has some joint stiffness and achiness but overall she thinks she is able to tolerate anastrozole  fairly well.   Breast cancer surveillance: Breast exam 02/20/24: Benign Mammogram 02/19/2024: Benign breast density category B   Return to clinic in 1 year for follow-up

## 2024-02-20 ENCOUNTER — Inpatient Hospital Stay: Payer: Medicare HMO | Attending: Hematology and Oncology | Admitting: Hematology and Oncology

## 2024-02-20 VITALS — BP 132/64 | HR 74 | Temp 98.7°F | Resp 17 | Ht 59.0 in | Wt 170.5 lb

## 2024-02-20 DIAGNOSIS — Z170 Estrogen receptor positive status [ER+]: Secondary | ICD-10-CM | POA: Insufficient documentation

## 2024-02-20 DIAGNOSIS — M2560 Stiffness of unspecified joint, not elsewhere classified: Secondary | ICD-10-CM | POA: Insufficient documentation

## 2024-02-20 DIAGNOSIS — C50412 Malignant neoplasm of upper-outer quadrant of left female breast: Secondary | ICD-10-CM | POA: Diagnosis not present

## 2024-02-20 DIAGNOSIS — Z79811 Long term (current) use of aromatase inhibitors: Secondary | ICD-10-CM | POA: Insufficient documentation

## 2024-02-20 DIAGNOSIS — R232 Flushing: Secondary | ICD-10-CM | POA: Diagnosis not present

## 2024-02-20 DIAGNOSIS — Z923 Personal history of irradiation: Secondary | ICD-10-CM | POA: Insufficient documentation

## 2024-02-20 DIAGNOSIS — M810 Age-related osteoporosis without current pathological fracture: Secondary | ICD-10-CM | POA: Insufficient documentation

## 2024-02-20 LAB — CMP14+EGFR
ALT: 16 IU/L (ref 0–32)
AST: 22 IU/L (ref 0–40)
Albumin: 4.1 g/dL (ref 3.9–4.9)
Alkaline Phosphatase: 62 IU/L (ref 44–121)
BUN/Creatinine Ratio: 14 (ref 12–28)
BUN: 15 mg/dL (ref 8–27)
Bilirubin Total: 0.4 mg/dL (ref 0.0–1.2)
CO2: 22 mmol/L (ref 20–29)
Calcium: 7.9 mg/dL — ABNORMAL LOW (ref 8.7–10.3)
Chloride: 101 mmol/L (ref 96–106)
Creatinine, Ser: 1.06 mg/dL — ABNORMAL HIGH (ref 0.57–1.00)
Globulin, Total: 2 g/dL (ref 1.5–4.5)
Glucose: 331 mg/dL — ABNORMAL HIGH (ref 70–99)
Potassium: 4.8 mmol/L (ref 3.5–5.2)
Sodium: 136 mmol/L (ref 134–144)
Total Protein: 6.1 g/dL (ref 6.0–8.5)
eGFR: 57 mL/min/{1.73_m2} — ABNORMAL LOW (ref >59)

## 2024-02-20 LAB — LIPID PANEL
Chol/HDL Ratio: 2.9 ratio (ref 0.0–4.4)
Cholesterol, Total: 121 mg/dL (ref 100–199)
HDL: 42 mg/dL (ref >39)
LDL Chol Calc (NIH): 44 mg/dL (ref 0–99)
Triglycerides: 222 mg/dL — ABNORMAL HIGH (ref 0–149)
VLDL Cholesterol Cal: 35 mg/dL (ref 5–40)

## 2024-02-20 LAB — TSH+FREE T4
Free T4: 1.9 ng/dL — ABNORMAL HIGH (ref 0.82–1.77)
TSH: 0.09 u[IU]/mL — ABNORMAL LOW (ref 0.450–4.500)

## 2024-02-20 NOTE — Progress Notes (Signed)
 Patient Care Team: Zollie Lowers, MD as PCP - General (Family Medicine) Curvin Deward MOULD, MD as Consulting Physician (General Surgery) Odean Potts, MD as Consulting Physician (Hematology and Oncology) Shannon Agent, MD as Consulting Physician (Radiation Oncology) Odean Potts, MD as Consulting Physician (Hematology and Oncology)  DIAGNOSIS:  Encounter Diagnosis  Name Primary?   Malignant neoplasm of upper-outer quadrant of left breast in female, estrogen receptor positive (HCC) Yes    SUMMARY OF ONCOLOGIC HISTORY: Oncology History  Malignant neoplasm of upper-outer quadrant of left breast in female, estrogen receptor positive (HCC)  02/21/2022 Initial Diagnosis   Screening mammogram detected left breast asymmetry mass detected on ultrasound at 3 o'clock position measuring 0.7 cm, axilla negative, biopsy revealed grade 2 IDC with DCIS ER 95%, PR 90%, HER2 equivocal by IHC, FISH negative, Ki-67 3%   02/28/2022 Cancer Staging   Staging form: Breast, AJCC 8th Edition - Clinical: Stage IA (cT1b, cN0, cM0, G2, ER+, PR+, HER2-) - Signed by Odean Potts, MD on 02/28/2022 Stage prefix: Initial diagnosis Histologic grading system: 3 grade system    Genetic Testing   [facility] CustomNext Panel was Negative. Report date is 03/19/2022.  The CustomNext-Cancer+RNAinsight panel offered by [employer] includes sequencing and rearrangement analysis for the following 49 genes:  APC, ATM, AXIN2, BARD1, BMPR1A, BRCA1, BRCA2, BRIP1, CDH1, CDK4, CDKN2A, CHEK2, CTNNA1, DICER1, EPCAM, GREM1, HOXB13, KIT, MEN1, MLH1, MSH2, MSH3, MSH6, MUTYH, NBN, NF1, NTHL1, PALB2, PDGFRA, PMS2, POLD1, POLE, PRKAR1A, PTEN, RAD50, RAD51C, RAD51D, RET, SDHA, SDHB, SDHC, SDHD, SMAD4, SMARCA4, STK11, TP53, TSC1, TSC2, and VHL.  RNA data is routinely analyzed for use in variant interpretation for all genes.   03/22/2022 Surgery   Left lumpectomy: 9 mm grade 2 IDC with DCIS with clear margins 0/2 lymph nodes negative ER 95%, PR 90%,  HER2 2+ by IHC negative by FISH, Ki-67 3%   03/22/2022 Oncotype testing   21/7%   05/02/2022 - 05/22/2022 Radiation Therapy   Site Technique Total Dose (Gy) Dose per Fx (Gy) Completed Fx Beam Energies  Breast, Left: Breast_L 3D 42.72/42.72 2.67 16/16 10X     05/2022 -  Anti-estrogen oral therapy   Anastrozole      CHIEF COMPLIANT: Follow-up on antiestrogen therapy  HISTORY OF PRESENT ILLNESS:  History of Present Illness Morgan Schmidt is a 73 year old female with estrogen receptor-positive breast cancer who presents for a routine follow-up regarding her medication and bone health.  She has been on Anastrozole  for two years and experiences joint stiffness, which she manages with exercise and stretching. Hot flashes are present but not severe. Recent mammogram results are normal, and her breast tissue is not dense.  She is taking Alendronate  for osteoporosis management, with a DEXA scan ordered to assess bone density. She continues her daily medication as prescribed and has enough refills until next year.  She stays active with walking and stretching to reduce joint stiffness. She recently returned from a beach vacation in [place].     ALLERGIES:  is allergic to lipitor [atorvastatin calcium ] and ace inhibitors.  MEDICATIONS:  Current Outpatient Medications  Medication Sig Dispense Refill   Accu-Chek FastClix Lancets MISC Use to check Blood Sugars twice daily. Dx E11.8 204 each 3   acetaminophen  (TYLENOL ) 325 MG tablet Take 325 mg by mouth every 6 (six) hours as needed (for pain or headaches).     Alcohol Swabs  (B-D SINGLE USE SWABS  REGULAR) PADS Use to check Blood Sugars twice daily. Dx E11.8 200 each 3   alendronate  (  FOSAMAX ) 70 MG tablet TAKE 1 TABLET EVERY 7 DAYS WITH A FULL GLASS OF WATER ON AN EMPTY STOMACH. DO NOT LAY DOWN FOR AT LEAST 2 HOURS 12 tablet 3   anastrozole  (ARIMIDEX ) 1 MG tablet TAKE 1 TABLET EVERY DAY 90 tablet 3   Bacillus Coagulans-Inulin  (PROBIOTIC-PREBIOTIC PO) Take 1 capsule by mouth daily as needed (upset stomach).     Blood Glucose Calibration (ACCU-CHEK AVIVA) SOLN Use to check Blood Sugars twice daily. Dx E11.8 1 each 3   Blood Glucose Monitoring Suppl (ACCU-CHEK AVIVA PLUS) w/Device KIT Use to check Blood Sugars twice daily. Dx E11.8 1 kit 0   Cholecalciferol (VITAMIN D ) 2000 UNITS tablet Take 1 tablet (2,000 Units total) by mouth daily. 30 tablet 11   diclofenac  sodium (VOLTAREN ) 1 % GEL Apply 2-4 g topically See admin instructions. Apply 2-4 grams to each hand up to 4 times a day as needed for arthritis in the fingers 100 g 11   Dulaglutide  (TRULICITY ) 4.5 MG/0.5ML SOAJ Inject 4.5 mg into the skin once a week. 6 mL 3   glucose blood (ACCU-CHEK AVIVA PLUS) test strip CHECK BLOOD SUGAR TWO TIMES DAILY 200 strip 3   insulin  glargine (LANTUS  SOLOSTAR) 100 UNIT/ML Solostar Pen Inject 110 Units into the skin daily after breakfast. 105 mL 0   Insulin  Pen Needle (DROPLET PEN NEEDLES) 31G X 8 MM MISC USE AS DIRECTED EVERY DAY 100 each 3   levothyroxine  (SYNTHROID ) 88 MCG tablet Take 1 tablet (88 mcg total) by mouth daily before breakfast. 90 tablet 1   metFORMIN  (GLUCOPHAGE ) 1000 MG tablet Take 1 tablet (1,000 mg total) by mouth 2 (two) times daily with a meal. 180 tablet 3   olmesartan -hydrochlorothiazide (BENICAR  HCT) 40-25 MG tablet Take 1 tablet by mouth daily. 90 tablet 0   ondansetron  (ZOFRAN -ODT) 8 MG disintegrating tablet Take 1 tablet (8 mg total) by mouth every 6 (six) hours as needed for nausea or vomiting. 20 tablet 1   pantoprazole (PROTONIX) 40 MG tablet Take 1 tablet (40 mg total) by mouth daily. For stomach 30 tablet 11   rosuvastatin  (CRESTOR ) 20 MG tablet TAKE 1 TABLET EVERY DAY FOR CHOLESTEROL 90 tablet 3   traMADol  (ULTRAM ) 50 MG tablet Take 1-2 tablets (50-100 mg total) by mouth every 6 (six) hours as needed. 30 tablet 0   No current facility-administered medications for this visit.    PHYSICAL  EXAMINATION: ECOG PERFORMANCE STATUS: 1 - Symptomatic but completely ambulatory  Vitals:   02/20/24 0947  BP: 132/64  Pulse: 74  Resp: 17  Temp: 98.7 F (37.1 C)  SpO2: 100%   Filed Weights   02/20/24 0947  Weight: 170 lb 8 oz (77.3 kg)    LABORATORY DATA:  I have reviewed the data as listed    Latest Ref Rng & Units 02/19/2024   11:40 AM 10/23/2023   11:12 AM 07/25/2023   10:33 AM  CMP  Glucose 70 - 99 mg/dL 668  823  73   BUN 8 - 27 mg/dL 15  26  19    Creatinine 0.57 - 1.00 mg/dL 8.93  8.88  9.04   Sodium 134 - 144 mmol/L 136  136  139   Potassium 3.5 - 5.2 mmol/L 4.8  4.6  4.3   Chloride 96 - 106 mmol/L 101  101  99   CO2 20 - 29 mmol/L 22  22  25    Calcium  8.7 - 10.3 mg/dL 7.9  8.2  9.0   Total  Protein 6.0 - 8.5 g/dL 6.1  6.2  6.6   Total Bilirubin 0.0 - 1.2 mg/dL 0.4  0.6  0.7   Alkaline Phos 44 - 121 IU/L 62  67  65   AST 0 - 40 IU/L 22  16  18    ALT 0 - 32 IU/L 16  12  14      Lab Results  Component Value Date   WBC 6.1 10/23/2023   HGB 12.0 10/23/2023   HCT 37.3 10/23/2023   MCV 84 10/23/2023   PLT 198 10/23/2023   NEUTROABS 4.2 10/23/2023    ASSESSMENT & PLAN:  Malignant neoplasm of upper-outer quadrant of left breast in female, estrogen receptor positive (HCC) 03/22/2022:Left lumpectomy: 9 mm grade 2 IDC with DCIS with clear margins 0/2 lymph nodes negative ER 95%, PR 90%, HER2 2+ by IHC negative by FISH, Ki-67 3% Oncotype DX recurrence score: 21 (risk of distant recurrence: 7%)   Treatment plan: 1.  adjuvant radiation 05/02/2022-05/22/2022 3.  Adjuvant antiestrogen therapy with letrozole  started November 2023 discontinued December 2023 because of joint pains switched to anastrozole  10/04/2022 (due to shoulder and hip pain)   Anastrozole  toxicities: Still has some joint stiffness and achiness but overall she thinks she is able to tolerate anastrozole  fairly well.   Breast cancer surveillance: Mammogram 02/19/2024: Benign breast density category B   Return  to clinic in 1 year for follow-up Assessment & Plan Breast cancer follow-up Annual follow-up to monitor breast cancer status and medication effects. Mammogram results are normal, and breasts are not dense, reducing the risk of missed findings. - Schedule annual follow-up appointment after mammogram.  Chronic joint stiffness Chronic joint stiffness likely related to ongoing anastrozole  therapy. She reports being accustomed to the stiffness, indicating a well-managed condition. - Encourage regular exercise and stretching, such as walking for fifteen minutes and performing basic stretches, to reduce joint stiffness.      No orders of the defined types were placed in this encounter.  The patient has a good understanding of the overall plan. she agrees with it. she will call with any problems that may develop before the next visit here. Total time spent: 30 mins including face to face time and time spent for planning, charting and co-ordination of care   Viinay K Waymond Meador, MD 02/20/24

## 2024-02-26 ENCOUNTER — Encounter: Payer: Self-pay | Admitting: Family Medicine

## 2024-02-27 ENCOUNTER — Ambulatory Visit: Payer: Self-pay | Admitting: Family Medicine

## 2024-02-27 ENCOUNTER — Other Ambulatory Visit: Payer: Self-pay | Admitting: Family Medicine

## 2024-02-27 ENCOUNTER — Other Ambulatory Visit: Payer: Self-pay

## 2024-02-27 DIAGNOSIS — E039 Hypothyroidism, unspecified: Secondary | ICD-10-CM

## 2024-02-27 MED ORDER — LEVOTHYROXINE SODIUM 75 MCG PO TABS: 75.0000 ug | ORAL_TABLET | Freq: Every day | ORAL | 0 refills | Status: DC

## 2024-04-01 ENCOUNTER — Other Ambulatory Visit

## 2024-04-01 DIAGNOSIS — Z1329 Encounter for screening for other suspected endocrine disorder: Secondary | ICD-10-CM | POA: Diagnosis not present

## 2024-04-01 DIAGNOSIS — E039 Hypothyroidism, unspecified: Secondary | ICD-10-CM | POA: Diagnosis not present

## 2024-04-01 DIAGNOSIS — E038 Other specified hypothyroidism: Secondary | ICD-10-CM

## 2024-04-01 DIAGNOSIS — E1169 Type 2 diabetes mellitus with other specified complication: Secondary | ICD-10-CM | POA: Diagnosis not present

## 2024-04-01 DIAGNOSIS — E782 Mixed hyperlipidemia: Secondary | ICD-10-CM | POA: Diagnosis not present

## 2024-04-01 LAB — BAYER DCA HB A1C WAIVED: HB A1C (BAYER DCA - WAIVED): 9.2 % — ABNORMAL HIGH (ref 4.8–5.6)

## 2024-04-02 LAB — CMP14+EGFR
ALT: 19 IU/L (ref 0–32)
AST: 24 IU/L (ref 0–40)
Albumin: 4.2 g/dL (ref 3.8–4.8)
Alkaline Phosphatase: 82 IU/L (ref 44–121)
BUN/Creatinine Ratio: 19 (ref 12–28)
BUN: 21 mg/dL (ref 8–27)
Bilirubin Total: 0.6 mg/dL (ref 0.0–1.2)
CO2: 23 mmol/L (ref 20–29)
Calcium: 8.3 mg/dL — ABNORMAL LOW (ref 8.7–10.3)
Chloride: 98 mmol/L (ref 96–106)
Creatinine, Ser: 1.11 mg/dL — ABNORMAL HIGH (ref 0.57–1.00)
Globulin, Total: 2 g/dL (ref 1.5–4.5)
Glucose: 138 mg/dL — ABNORMAL HIGH (ref 70–99)
Potassium: 3.9 mmol/L (ref 3.5–5.2)
Sodium: 137 mmol/L (ref 134–144)
Total Protein: 6.2 g/dL (ref 6.0–8.5)
eGFR: 53 mL/min/1.73 — ABNORMAL LOW (ref >59)

## 2024-04-02 LAB — TSH+FREE T4
Free T4: 1.07 ng/dL (ref 0.82–1.77)
TSH: 15.7 u[IU]/mL — ABNORMAL HIGH (ref 0.450–4.500)

## 2024-04-02 LAB — T4 AND TSH: T4, Total: 7.7 ug/dL (ref 4.5–12.0)

## 2024-04-02 LAB — T3, FREE: T3, Free: 2.1 pg/mL (ref 2.0–4.4)

## 2024-04-06 ENCOUNTER — Other Ambulatory Visit: Payer: Self-pay | Admitting: Family Medicine

## 2024-04-06 ENCOUNTER — Ambulatory Visit: Payer: Self-pay | Admitting: Family Medicine

## 2024-04-06 DIAGNOSIS — E782 Mixed hyperlipidemia: Secondary | ICD-10-CM

## 2024-04-06 DIAGNOSIS — I10 Essential (primary) hypertension: Secondary | ICD-10-CM

## 2024-04-06 DIAGNOSIS — E039 Hypothyroidism, unspecified: Secondary | ICD-10-CM

## 2024-04-06 DIAGNOSIS — E118 Type 2 diabetes mellitus with unspecified complications: Secondary | ICD-10-CM

## 2024-04-06 MED ORDER — LEVOTHYROXINE SODIUM 100 MCG PO TABS: 100.0000 ug | ORAL_TABLET | Freq: Every day | ORAL | 1 refills | Status: DC

## 2024-04-07 NOTE — Telephone Encounter (Signed)
 Pt returned my call. Reviewed results with pt and made her lab appt on 10/27 (a few days before her appt with pcp on 10/29) as requested by PCP.   Need to add future lab orders.

## 2024-04-07 NOTE — Telephone Encounter (Signed)
 Left detailed message for patient to call back to get her lab results.  Also, pt is scheduled to see PCP tomorrow (8/20) for a follow up but per PCP lab result notes, PCP does not need to see pt back for 6-8 weeks. If patient is okay with this, then we can cancel the appt for tomorrow and just wait to see her back in October at her next scheduled visit. She does need a lab appt to come in a few days before the October visit, to do her labs ahead of time.

## 2024-04-08 ENCOUNTER — Ambulatory Visit: Admitting: Family Medicine

## 2024-04-08 NOTE — Telephone Encounter (Addendum)
 Labs ordered.

## 2024-04-08 NOTE — Addendum Note (Signed)
 Addended by: JODENE AMI NOVAK on: 04/08/2024 05:46 PM   Modules accepted: Orders

## 2024-04-22 DIAGNOSIS — D013 Carcinoma in situ of anus and anal canal: Secondary | ICD-10-CM | POA: Diagnosis not present

## 2024-04-29 ENCOUNTER — Other Ambulatory Visit: Payer: Self-pay | Admitting: Family Medicine

## 2024-04-29 DIAGNOSIS — I10 Essential (primary) hypertension: Secondary | ICD-10-CM

## 2024-05-11 ENCOUNTER — Other Ambulatory Visit: Payer: Self-pay | Admitting: Family Medicine

## 2024-05-11 DIAGNOSIS — E039 Hypothyroidism, unspecified: Secondary | ICD-10-CM

## 2024-05-31 ENCOUNTER — Other Ambulatory Visit: Payer: Self-pay | Admitting: Family Medicine

## 2024-06-15 ENCOUNTER — Other Ambulatory Visit

## 2024-06-17 ENCOUNTER — Ambulatory Visit: Admitting: Family Medicine

## 2024-06-22 ENCOUNTER — Other Ambulatory Visit: Payer: Self-pay

## 2024-06-22 DIAGNOSIS — E039 Hypothyroidism, unspecified: Secondary | ICD-10-CM

## 2024-06-22 DIAGNOSIS — E1169 Type 2 diabetes mellitus with other specified complication: Secondary | ICD-10-CM

## 2024-06-22 DIAGNOSIS — Z794 Long term (current) use of insulin: Secondary | ICD-10-CM

## 2024-06-22 DIAGNOSIS — I10 Essential (primary) hypertension: Secondary | ICD-10-CM

## 2024-06-22 LAB — CBC WITH DIFFERENTIAL/PLATELET

## 2024-06-22 LAB — LIPID PANEL

## 2024-06-22 LAB — BAYER DCA HB A1C WAIVED: HB A1C (BAYER DCA - WAIVED): 8.7 % — ABNORMAL HIGH (ref 4.8–5.6)

## 2024-06-23 LAB — TSH+FREE T4
Free T4: 1.85 ng/dL — AB (ref 0.82–1.77)
TSH: 0.466 u[IU]/mL (ref 0.450–4.500)

## 2024-06-23 LAB — CBC WITH DIFFERENTIAL/PLATELET
Basos: 1 %
EOS (ABSOLUTE): 0.1 x10E3/uL (ref 0.0–0.2)
Eos: 4 %
Hematocrit: 35.1 % (ref 34.0–46.6)
Hemoglobin: 11.1 g/dL (ref 11.1–15.9)
Immature Granulocytes: 0 %
Immature Granulocytes: 0 x10E3/uL (ref 0.0–0.1)
Lymphs: 27 %
MCH: 27.7 pg (ref 26.6–33.0)
MCHC: 31.6 g/dL (ref 31.5–35.7)
MCV: 88 fL (ref 79–97)
Monocytes Absolute: 0.2 x10E3/uL (ref 0.0–0.4)
Monocytes Absolute: 0.3 x10E3/uL (ref 0.1–0.9)
Monocytes: 7 %
Neutrophils Absolute: 1.2 x10E3/uL (ref 0.7–3.1)
Neutrophils Absolute: 2.6 x10E3/uL (ref 1.4–7.0)
Neutrophils: 61 %
Platelets: 194 x10E3/uL (ref 150–450)
RBC: 4.01 x10E6/uL (ref 3.77–5.28)
RDW: 13.4 % (ref 11.7–15.4)
WBC: 4.3 x10E3/uL (ref 3.4–10.8)

## 2024-06-23 LAB — CMP14+EGFR
ALT: 16 IU/L (ref 0–32)
AST: 21 IU/L (ref 0–40)
Albumin: 4.4 g/dL (ref 3.8–4.8)
Alkaline Phosphatase: 71 IU/L (ref 49–135)
BUN/Creatinine Ratio: 16 (ref 12–28)
BUN: 16 mg/dL (ref 8–27)
Bilirubin Total: 0.5 mg/dL (ref 0.0–1.2)
CO2: 23 mmol/L (ref 20–29)
Calcium: 8.3 mg/dL — ABNORMAL LOW (ref 8.7–10.3)
Chloride: 103 mmol/L (ref 96–106)
Creatinine, Ser: 1.01 mg/dL — AB (ref 0.57–1.00)
Globulin, Total: 1.9 g/dL (ref 1.5–4.5)
Glucose: 146 mg/dL — ABNORMAL HIGH (ref 70–99)
Potassium: 4.8 mmol/L (ref 3.5–5.2)
Sodium: 140 mmol/L (ref 134–144)
Total Protein: 6.3 g/dL (ref 6.0–8.5)
eGFR: 60 mL/min/1.73 (ref >59)

## 2024-06-23 LAB — LIPID PANEL
Cholesterol, Total: 143 mg/dL (ref 100–199)
HDL: 43 mg/dL (ref >39)
LDL CALC COMMENT:: 3.3 ratio (ref 0.0–4.4)
LDL Chol Calc (NIH): 71 mg/dL (ref 0–99)
Triglycerides: 172 mg/dL — AB (ref 0–149)
VLDL Cholesterol Cal: 29 mg/dL (ref 5–40)

## 2024-06-25 ENCOUNTER — Other Ambulatory Visit: Payer: Self-pay | Admitting: *Deleted

## 2024-06-25 DIAGNOSIS — E1169 Type 2 diabetes mellitus with other specified complication: Secondary | ICD-10-CM

## 2024-07-08 ENCOUNTER — Ambulatory Visit (INDEPENDENT_AMBULATORY_CARE_PROVIDER_SITE_OTHER): Payer: Self-pay | Admitting: Family Medicine

## 2024-07-08 ENCOUNTER — Telehealth: Payer: Self-pay | Admitting: Family Medicine

## 2024-07-08 VITALS — BP 107/58 | HR 90 | Temp 97.6°F | Ht 59.0 in | Wt 172.0 lb

## 2024-07-08 DIAGNOSIS — E038 Other specified hypothyroidism: Secondary | ICD-10-CM | POA: Diagnosis not present

## 2024-07-08 DIAGNOSIS — E1169 Type 2 diabetes mellitus with other specified complication: Secondary | ICD-10-CM | POA: Diagnosis not present

## 2024-07-08 DIAGNOSIS — Z23 Encounter for immunization: Secondary | ICD-10-CM | POA: Diagnosis not present

## 2024-07-08 DIAGNOSIS — I10 Essential (primary) hypertension: Secondary | ICD-10-CM | POA: Diagnosis not present

## 2024-07-08 DIAGNOSIS — Z794 Long term (current) use of insulin: Secondary | ICD-10-CM

## 2024-07-08 DIAGNOSIS — E782 Mixed hyperlipidemia: Secondary | ICD-10-CM

## 2024-07-08 NOTE — Telephone Encounter (Signed)
 Please call pt to make an appt for dexa.

## 2024-07-08 NOTE — Progress Notes (Signed)
 Subjective:  [name], female    DOB: Mar 22, 1951  Age: 73 y.o. MRN: [id number]  CC: Medical Management of Chronic Issues   HPI  Discussed the use of AI scribe software for clinical note transcription with the patient, who gave verbal consent to proceed.  History of Present Illness Morgan Schmidt is a 73 year old female with diabetes who presents for follow-up after COVID-19 infection.  She recently had her first confirmed case of COVID-19, which has mostly resolved, but she continues to experience intermittent symptoms such as rhinorrhea and a persistent cough.  Regarding her diabetes management, she had to stop taking Trulicity  for about six weeks due to severe nausea and vomiting, which made it difficult to keep anything down. She has resumed Trulicity  for the past two and a half weeks and reports that her blood sugar levels are starting to decrease. She continues to take Lantus  at 110 units and metformin  twice daily. Her last A1c was 8.7, down from 9.2 three months ago, but higher than the 7.2 recorded in July.  She experiences heartburn at certain times, which she manages with pantoprazole  daily and occasional use of Tums for quick relief. She identifies certain foods as triggers for her reflux symptoms.  In terms of her thyroid  function, she denies symptoms such as hot flashes, excessive warmth, diarrhea, heart palpitations, or increased nervousness, despite a slightly elevated free T4 level.  She denies any symptoms of dizziness or lightheadedness.          10/23/2023   11:11 AM 10/09/2023   12:56 PM 07/25/2023   10:50 AM  Depression screen PHQ 2/9  Decreased Interest 0 0 0  Down, Depressed, Hopeless 0 0 0  PHQ - 2 Score 0 0 0  Altered sleeping 0  0  Tired, decreased energy 0  0  Change in appetite 0  0  Feeling bad or failure about yourself  0  0  Trouble concentrating 0  0  Moving slowly or fidgety/restless 0  0  Suicidal thoughts 0  0  PHQ-9  Score 0   0   Difficult doing work/chores Not difficult at all  Not difficult at all     Data saved with a previous flowsheet row definition    History Morgan Schmidt has a past medical history of AIN (anal intraepithelial neoplasia) anal canal, Cancer (HCC) (03/15/2022), History of adenomatous polyp of colon, History of radiation therapy, History of thyroid  cancer (06/2008), Hyperlipidemia, Hypertension, Hypothyroidism, postsurgical (06/2008), OA (osteoarthritis), Personal history of radiation therapy, Type 2 diabetes mellitus treated with insulin  (HCC), and Wears dentures.   She has a past surgical history that includes Total thyroidectomy (Bilateral, 06/30/2008); Cesarean section (1985); Knee arthroscopy (Left, 10/22/2016); Knee arthroscopy with medial menisectomy (10/22/2016); Knee arthroscopy with subchondroplasty (Left, 10/22/2016); Hip pinning, cannulated (Left, 03/10/2018); Colonoscopy with propofol  (11/10/2021); Anal intraepithelial neoplasia excision (N/A, 11/30/2021); High resolution anoscopy (N/A, 11/30/2021); Breast lumpectomy with radioactive seed and sentinel lymph node biopsy (Left, 03/22/2022); Cataract extraction (Left, 06/2023); Rectal exam under anesthesia (N/A, 11/14/2023); and Anal intraepithelial neoplasia excision (N/A, 11/14/2023).   Her family history includes Arthritis in her father; Heart disease in her mother.She reports that she quit smoking about 23 years ago. Her smoking use included cigarettes. She started smoking about 28 years ago. She has a 2.5 pack-year smoking history. She has never used smokeless tobacco. She reports that she does not drink alcohol and does not use drugs.    ROS Review of Systems  Constitutional:  Negative.   HENT: Negative.  Negative for congestion.   Eyes:  Negative for visual disturbance.  Respiratory:  Positive for cough. Negative for shortness of breath.   Cardiovascular:  Negative for chest pain.  Gastrointestinal:  Negative for abdominal pain,  constipation, diarrhea, nausea and vomiting.  Genitourinary:  Negative for difficulty urinating.  Musculoskeletal:  Negative for arthralgias and myalgias.  Neurological:  Negative for headaches.  Psychiatric/Behavioral:  Negative for sleep disturbance.     Objective:  BP (!) 107/58   Pulse 90   Temp 97.6 F (36.4 C)   Ht 4' 11 (1.499 m)   Wt 172 lb (78 kg)   LMP 03/02/2004   SpO2 95%   BMI 34.74 kg/m   BP Readings from Last 3 Encounters:  07/08/24 (!) 107/58  02/20/24 132/64  02/19/24 (!) 129/56    Wt Readings from Last 3 Encounters:  07/08/24 172 lb (78 kg)  02/20/24 170 lb 8 oz (77.3 kg)  02/19/24 170 lb (77.1 kg)     Physical Exam Constitutional:      General: She is not in acute distress.    Appearance: She is well-developed.  Cardiovascular:     Rate and Rhythm: Normal rate and regular rhythm.  Pulmonary:     Breath sounds: Normal breath sounds.  Musculoskeletal:        General: Normal range of motion.  Skin:    General: Skin is warm and dry.  Neurological:     Mental Status: She is alert and oriented to person, place, and time.    Physical Exam VITALS: BP- 107/55 GENERAL: Alert, cooperative, well developed, no acute distress. HEENT: Normocephalic, normal oropharynx, moist mucous membranes. CHEST: Clear to auscultation bilaterally, no wheezes, rhonchi, or crackles. CARDIOVASCULAR: Normal heart rate and rhythm, S1 and S2 normal without murmurs. ABDOMEN: Soft, non-tender, non-distended, without organomegaly, normal bowel sounds. EXTREMITIES: No cyanosis or edema. NEUROLOGICAL: Cranial nerves grossly intact, moves all extremities without gross motor or sensory deficit.   Assessment & Plan:  Mixed diabetic hyperlipidemia associated with type 2 diabetes mellitus (HCC)  Encounter for immunization -     Flu vaccine HIGH DOSE PF(Fluzone Trivalent)  Essential hypertension  Other specified hypothyroidism    Assessment and Plan Assessment &  Plan Type 2 diabetes mellitus   Her recent A1c improved to 8.7 from 9.2 three months ago, with a blood glucose level of 146 mg/dL. Trulicity  was interrupted for six weeks due to nausea and vomiting but has been resumed for two and a half weeks. Lantus  and metformin  were continued throughout. The goal is to reduce A1c to around 7% over the next three months, despite holiday challenges. Continue Trulicity , Lantus , and metformin . Monitor blood glucose closely, especially during illness.  Essential hypertension   Blood pressure is well-controlled at 107/55 mmHg. There is a concern for dizziness and lightheadedness in older adults, which may require medication adjustment if symptoms occur. Monitor for symptoms and adjust blood pressure medication if necessary.  Hypothyroidism   Free T4 is slightly elevated at 1.85, with no symptoms of hyperthyroidism. Continue the current thyroid  medication dose of 100 mcg. Recheck thyroid  function at the next visit.  Gastroesophageal reflux disease   Intermittent heartburn is managed with daily pantoprazole . Symptoms are triggered by certain foods and relieved with over-the-counter antacids like Tums. Continue pantoprazole  daily and use antacids for breakthrough symptoms. Avoid trigger foods such as spicy, fatty, and strongly flavored foods.  Encounter for immunization   She received a flu shot today. Ensure  flu shot is up to date.       Follow-up: Return in about 3 months (around 10/08/2024) for diabetes.  Butler Der, M.D.

## 2024-07-09 NOTE — Telephone Encounter (Signed)
 Called patient and scheduled appt

## 2024-07-11 ENCOUNTER — Other Ambulatory Visit: Payer: Self-pay | Admitting: Hematology and Oncology

## 2024-07-11 ENCOUNTER — Other Ambulatory Visit: Payer: Self-pay | Admitting: Family Medicine

## 2024-07-11 DIAGNOSIS — I10 Essential (primary) hypertension: Secondary | ICD-10-CM

## 2024-07-15 ENCOUNTER — Ambulatory Visit (INDEPENDENT_AMBULATORY_CARE_PROVIDER_SITE_OTHER)

## 2024-07-15 ENCOUNTER — Ambulatory Visit: Payer: Self-pay | Admitting: Family Medicine

## 2024-07-15 ENCOUNTER — Other Ambulatory Visit: Payer: Self-pay | Admitting: Family Medicine

## 2024-07-15 DIAGNOSIS — Z13820 Encounter for screening for osteoporosis: Secondary | ICD-10-CM

## 2024-07-15 DIAGNOSIS — Z780 Asymptomatic menopausal state: Secondary | ICD-10-CM | POA: Diagnosis not present

## 2024-07-15 DIAGNOSIS — M85851 Other specified disorders of bone density and structure, right thigh: Secondary | ICD-10-CM | POA: Diagnosis not present

## 2024-07-27 ENCOUNTER — Ambulatory Visit: Attending: Hematology and Oncology

## 2024-07-27 VITALS — Wt 172.5 lb

## 2024-07-27 DIAGNOSIS — Z483 Aftercare following surgery for neoplasm: Secondary | ICD-10-CM | POA: Insufficient documentation

## 2024-07-27 NOTE — Therapy (Signed)
 OUTPATIENT PHYSICAL THERAPY SOZO SCREENING NOTE   Patient Name: Morgan Schmidt MRN: [id number] DOB:November 12, 1950, 73 y.o., female Today's Date: 07/27/2024  PCP: Zollie Lowers, MD REFERRING PROVIDER: Odean Potts, MD   PT End of Session - 07/27/24 [phone]     Visit Number 5   # unchanged due to screen only   PT Start Time 0812    PT Stop Time 0816    PT Time Calculation (min) 4 min    Activity Tolerance Patient tolerated treatment well    Behavior During Therapy [facility] for tasks assessed/performed          Past Medical History:  Diagnosis Date   AIN (anal intraepithelial neoplasia) anal canal    Cancer (HCC) 03/15/2022   breast   History of adenomatous polyp of colon    History of radiation therapy    Left breast- 05/01/22-05/22/22- Dr. Lynwood Nasuti   History of thyroid  cancer 06/2008   s/p  total bilateral thyroidectomy for symptomatic multinodular goiter;   06-30-2008 s/p total thyroidectomy -- papillary thyroid  cancer  and treated w/ RAI post surgery 09-29-2008   (last scan negative for recurrence in epic 03/ 2011)   Hyperlipidemia    Hypertension    Hypothyroidism, postsurgical 06/2008   and had RAI post surgery 02/ 2010;   followed by pcp   OA (osteoarthritis)    Personal history of radiation therapy    Type 2 diabetes mellitus treated with insulin  (HCC)    followed by pcp   (11-23-2021  per pt checks daily in am and at times in afternoon,  fasting sugar--- 95--110)   Wears dentures    upper   Past Surgical History:  Procedure Laterality Date   ANAL INTRAEPITHELIAL NEOPLASIA EXCISION N/A 11/30/2021   Procedure: EXCISIONAL BIOPSY ANAL LESIONS;  Surgeon: Debby Hila, MD;  Location: [facility];  Service: General;  Laterality: N/A;   ANAL INTRAEPITHELIAL NEOPLASIA EXCISION N/A 11/14/2023   Procedure: EXCISION, NEOPLASM, INTRAEPITHELIAL, ANUS;  Surgeon: Debby Hila, MD;  Location: [facility];  Service: General;  Laterality: N/A;  EXAM UNDER ANESTHESIA WITH  BIOPSY   BREAST LUMPECTOMY WITH RADIOACTIVE SEED AND SENTINEL LYMPH NODE BIOPSY Left 03/22/2022   Procedure: LEFT BREAST LUMPECTOMY WITH RADIOACTIVE SEED AND SENTINEL LYMPH NODE BIOPSY;  Surgeon: Curvin Deward MOULD, MD;  Location: [facility];  Service: General;  Laterality: Left;  GEN & PEC BLOCK   CATARACT EXTRACTION Left 06/2023   CESAREAN SECTION  1985   W/  BILATERAL TUBAL LIGATION   COLONOSCOPY WITH PROPOFOL   11/10/2021   by Dr.Pyrtle   HIGH RESOLUTION ANOSCOPY N/A 11/30/2021   Procedure: HIGH RESOLUTION ANOSCOPY;  Surgeon: Debby Hila, MD;  Location: [facility];  Service: General;  Laterality: N/A;   HIP PINNING,CANNULATED Left 03/10/2018   Procedure: CANNULATED HIP PINNING;  Surgeon: Ernie Cough, MD;  Location[facility];  Service: Orthopedics;  Laterality: Left;   KNEE ARTHROSCOPY Left 10/22/2016   Procedure: ARTHROSCOPY LEFT KNEE WITH DEBRIDEMENT;  Surgeon: Cough Ernie, MD;  Location: [facility];  Service: Orthopedics;  Laterality: Left;   KNEE ARTHROSCOPY WITH MEDIAL MENISECTOMY  10/22/2016   Procedure: KNEE ARTHROSCOPY WITH MEDIAL MENISECTOMY;  Surgeon: Cough Ernie, MD;  Location: [facility];  Service: Orthopedics;;   KNEE ARTHROSCOPY WITH SUBCHONDROPLASTY Left 10/22/2016   Procedure: KNEE ARTHROSCOPY WITH MEDIAL CHONDROPLASTY;  Surgeon: Cough Ernie, MD;  Location: [facility];  Service: Orthopedics;  Laterality: Left;   RECTAL EXAM UNDER ANESTHESIA  N/A 11/14/2023   Procedure: EXAM UNDER ANESTHESIA, RECTUM;  Surgeon: Debby Hila, MD;  Location: [facility];  Service: General;  Laterality: N/A;  EXAM UNDER ANESTHESIA WITH BIOPSY, AND EXCISION   TOTAL THYROIDECTOMY Bilateral 06/30/2008   @[facility]  by dr mikell   Patient Active Problem List   Diagnosis Date Noted   Genetic testing 03/20/2022   Malignant neoplasm of upper-outer quadrant of left breast in female, estrogen receptor positive (HCC) 02/26/2022   Pain  in left knee 07/01/2018   Controlled diabetes mellitus type 2 with complications (HCC) 03/19/2018   Essential hypertension 03/23/2016   Other specified hypothyroidism 12/16/2014   Mixed diabetic hyperlipidemia associated with type 2 diabetes mellitus (HCC) 12/16/2014   Long term current use of insulin  (HCC) 12/16/2014   Polyposis of colon 12/16/2014    REFERRING DIAG: left breast cancer at risk for lymphedema  THERAPY DIAG: Aftercare following surgery for neoplasm  PERTINENT HISTORY: Patient was diagnosed on 02/21/22 with left grade 2. It measures 0.7 cm and is located in the upper-outer quadrant. It is ER+, PR+, HER2- with a Ki67 of 3%. Hx of rectal cancer and surgery in 12/2021. 03/22/22- L breast lumpectomy and SLNB 0/2, completed radiation 05/22/22  PRECAUTIONS: left UE Lymphedema risk, None  SUBJECTIVE: Pt returns for her 6 month L-Dex screen. I occasionally wear my sleeve still when needed.   PAIN:  Are you having pain? No  SOZO SCREENING: Patient was assessed today using the SOZO machine to determine the lymphedema index score. This was compared to her baseline score. It was determined that she is within the recommended range when compared to her baseline and no further action is needed at this time. She will continue SOZO screenings. These are done every 3 months for 2 years post operatively followed by every 6 months for 2 years, and then annually.   L-DEX FLOWSHEETS - 07/27/24 0800       L-DEX LYMPHEDEMA SCREENING   Measurement Type Unilateral    L-DEX MEASUREMENT EXTREMITY Upper Extremity    POSITION  Standing    DOMINANT SIDE Right    At Risk Side Left    BASELINE SCORE (UNILATERAL) 0    L-DEX SCORE (UNILATERAL) 1.9    VALUE CHANGE (UNILAT) 1.9             Aden Berwyn Caldron, PTA 07/27/2024, 8:17 AM

## 2024-08-10 ENCOUNTER — Other Ambulatory Visit: Payer: Self-pay | Admitting: Family Medicine

## 2024-08-10 DIAGNOSIS — E1169 Type 2 diabetes mellitus with other specified complication: Secondary | ICD-10-CM

## 2024-09-06 ENCOUNTER — Other Ambulatory Visit: Payer: Self-pay | Admitting: Family Medicine

## 2024-09-06 DIAGNOSIS — E1169 Type 2 diabetes mellitus with other specified complication: Secondary | ICD-10-CM

## 2024-09-23 ENCOUNTER — Other Ambulatory Visit: Payer: Self-pay | Admitting: Family Medicine

## 2024-09-23 DIAGNOSIS — E039 Hypothyroidism, unspecified: Secondary | ICD-10-CM

## 2024-10-09 ENCOUNTER — Ambulatory Visit: Payer: Self-pay

## 2024-10-14 ENCOUNTER — Ambulatory Visit: Admitting: Family Medicine

## 2025-01-25 ENCOUNTER — Ambulatory Visit: Attending: Hematology and Oncology

## 2025-03-10 ENCOUNTER — Ambulatory Visit: Admitting: Hematology and Oncology
